# Patient Record
Sex: Male | Born: 1945 | Hispanic: Refuse to answer | Marital: Single | Smoking: Former smoker
Health system: Southern US, Community
[De-identification: ages and names within clinical notes are randomized; demographics above are authoritative.]

## PROBLEM LIST (undated history)

## (undated) DIAGNOSIS — J449 Chronic obstructive pulmonary disease, unspecified: Secondary | ICD-10-CM

## (undated) DIAGNOSIS — R011 Cardiac murmur, unspecified: Secondary | ICD-10-CM

## (undated) DIAGNOSIS — J45909 Unspecified asthma, uncomplicated: Secondary | ICD-10-CM

## (undated) DIAGNOSIS — K429 Umbilical hernia without obstruction or gangrene: Secondary | ICD-10-CM

## (undated) DIAGNOSIS — I219 Acute myocardial infarction, unspecified: Secondary | ICD-10-CM

## (undated) DIAGNOSIS — D860 Sarcoidosis of lung: Secondary | ICD-10-CM

## (undated) DIAGNOSIS — I4510 Unspecified right bundle-branch block: Secondary | ICD-10-CM

## (undated) DIAGNOSIS — F419 Anxiety disorder, unspecified: Secondary | ICD-10-CM

## (undated) DIAGNOSIS — I2510 Atherosclerotic heart disease of native coronary artery without angina pectoris: Secondary | ICD-10-CM

## (undated) DIAGNOSIS — F32A Depression, unspecified: Secondary | ICD-10-CM

## (undated) DIAGNOSIS — N400 Enlarged prostate without lower urinary tract symptoms: Secondary | ICD-10-CM

## (undated) DIAGNOSIS — T7840XA Allergy, unspecified, initial encounter: Secondary | ICD-10-CM

## (undated) DIAGNOSIS — R0600 Dyspnea, unspecified: Secondary | ICD-10-CM

## (undated) DIAGNOSIS — F4310 Post-traumatic stress disorder, unspecified: Secondary | ICD-10-CM

## (undated) DIAGNOSIS — H269 Unspecified cataract: Secondary | ICD-10-CM

## (undated) DIAGNOSIS — E785 Hyperlipidemia, unspecified: Secondary | ICD-10-CM

## (undated) DIAGNOSIS — K219 Gastro-esophageal reflux disease without esophagitis: Secondary | ICD-10-CM

## (undated) DIAGNOSIS — F329 Major depressive disorder, single episode, unspecified: Secondary | ICD-10-CM

## (undated) DIAGNOSIS — I10 Essential (primary) hypertension: Secondary | ICD-10-CM

## (undated) DIAGNOSIS — E119 Type 2 diabetes mellitus without complications: Secondary | ICD-10-CM

## (undated) DIAGNOSIS — Z8619 Personal history of other infectious and parasitic diseases: Secondary | ICD-10-CM

## (undated) HISTORY — DX: Post-traumatic stress disorder, unspecified: F43.10

## (undated) HISTORY — PX: CARDIAC CATHETERIZATION: SHX172

## (undated) HISTORY — DX: Type 2 diabetes mellitus without complications: E11.9

## (undated) HISTORY — DX: Personal history of other infectious and parasitic diseases: Z86.19

## (undated) HISTORY — PX: OTHER SURGICAL HISTORY: SHX169

## (undated) HISTORY — PX: EYE SURGERY: SHX253

## (undated) HISTORY — DX: Hyperlipidemia, unspecified: E78.5

## (undated) HISTORY — DX: Essential (primary) hypertension: I10

## (undated) HISTORY — DX: Unspecified asthma, uncomplicated: J45.909

## (undated) HISTORY — DX: Sarcoidosis of lung: D86.0

## (undated) HISTORY — DX: Unspecified cataract: H26.9

## (undated) HISTORY — PX: CORONARY STENT PLACEMENT: SHX1402

## (undated) HISTORY — DX: Allergy, unspecified, initial encounter: T78.40XA

## (undated) HISTORY — DX: Acute myocardial infarction, unspecified: I21.9

## (undated) HISTORY — DX: Atherosclerotic heart disease of native coronary artery without angina pectoris: I25.10

---

## 1997-02-10 HISTORY — PX: CORONARY ANGIOPLASTY WITH STENT PLACEMENT: SHX49

## 2006-03-11 ENCOUNTER — Encounter: Payer: Self-pay | Admitting: Internal Medicine

## 2006-04-06 ENCOUNTER — Encounter: Payer: Self-pay | Admitting: Internal Medicine

## 2006-05-15 ENCOUNTER — Encounter: Payer: Self-pay | Admitting: Internal Medicine

## 2009-04-30 ENCOUNTER — Encounter: Payer: Self-pay | Admitting: Internal Medicine

## 2009-05-01 ENCOUNTER — Encounter: Payer: Self-pay | Admitting: Internal Medicine

## 2009-09-03 LAB — HM COLONOSCOPY

## 2009-09-07 ENCOUNTER — Encounter: Payer: Self-pay | Admitting: Internal Medicine

## 2009-10-12 ENCOUNTER — Encounter: Payer: Self-pay | Admitting: Internal Medicine

## 2012-05-19 DIAGNOSIS — I2119 ST elevation (STEMI) myocardial infarction involving other coronary artery of inferior wall: Secondary | ICD-10-CM

## 2012-05-19 HISTORY — DX: ST elevation (STEMI) myocardial infarction involving other coronary artery of inferior wall: I21.19

## 2013-08-15 ENCOUNTER — Telehealth: Payer: Self-pay | Admitting: *Deleted

## 2013-08-15 ENCOUNTER — Other Ambulatory Visit: Payer: Self-pay | Admitting: Adult Health

## 2013-08-15 ENCOUNTER — Ambulatory Visit (INDEPENDENT_AMBULATORY_CARE_PROVIDER_SITE_OTHER)
Admission: RE | Admit: 2013-08-15 | Discharge: 2013-08-15 | Disposition: A | Discharge status: Home / Self Care | Payer: Medicare Other | Source: Ambulatory Visit | Attending: Adult Health | Admitting: Adult Health

## 2013-08-15 ENCOUNTER — Ambulatory Visit (INDEPENDENT_AMBULATORY_CARE_PROVIDER_SITE_OTHER): Payer: Medicare Other | Admitting: Adult Health

## 2013-08-15 ENCOUNTER — Encounter: Payer: Self-pay | Admitting: Adult Health

## 2013-08-15 VITALS — BP 132/88 | HR 72 | Temp 98.3°F | Resp 16 | Ht 69.75 in | Wt 199.0 lb

## 2013-08-15 DIAGNOSIS — E875 Hyperkalemia: Secondary | ICD-10-CM

## 2013-08-15 DIAGNOSIS — I2510 Atherosclerotic heart disease of native coronary artery without angina pectoris: Secondary | ICD-10-CM

## 2013-08-15 DIAGNOSIS — R5381 Other malaise: Secondary | ICD-10-CM

## 2013-08-15 DIAGNOSIS — I10 Essential (primary) hypertension: Secondary | ICD-10-CM

## 2013-08-15 DIAGNOSIS — E785 Hyperlipidemia, unspecified: Secondary | ICD-10-CM

## 2013-08-15 DIAGNOSIS — R05 Cough: Secondary | ICD-10-CM

## 2013-08-15 DIAGNOSIS — Z23 Encounter for immunization: Secondary | ICD-10-CM | POA: Insufficient documentation

## 2013-08-15 DIAGNOSIS — R5383 Other fatigue: Secondary | ICD-10-CM | POA: Insufficient documentation

## 2013-08-15 DIAGNOSIS — Z1211 Encounter for screening for malignant neoplasm of colon: Secondary | ICD-10-CM

## 2013-08-15 DIAGNOSIS — R059 Cough, unspecified: Secondary | ICD-10-CM

## 2013-08-15 DIAGNOSIS — D126 Benign neoplasm of colon, unspecified: Secondary | ICD-10-CM | POA: Insufficient documentation

## 2013-08-15 LAB — CBC WITH DIFFERENTIAL/PLATELET
BASOS PCT: 0.5 % (ref 0.0–3.0)
Basophils Absolute: 0 10*3/uL (ref 0.0–0.1)
EOS PCT: 6.8 % — AB (ref 0.0–5.0)
Eosinophils Absolute: 0.5 10*3/uL (ref 0.0–0.7)
HCT: 44.1 % (ref 39.0–52.0)
Hemoglobin: 14.9 g/dL (ref 13.0–17.0)
LYMPHS PCT: 15 % (ref 12.0–46.0)
Lymphs Abs: 1.2 10*3/uL (ref 0.7–4.0)
MCHC: 33.9 g/dL (ref 30.0–36.0)
MCV: 85.9 fl (ref 78.0–100.0)
MONO ABS: 1.1 10*3/uL — AB (ref 0.1–1.0)
Monocytes Relative: 14.3 % — ABNORMAL HIGH (ref 3.0–12.0)
NEUTROS PCT: 63.4 % (ref 43.0–77.0)
Neutro Abs: 5 10*3/uL (ref 1.4–7.7)
Platelets: 311 10*3/uL (ref 150.0–400.0)
RBC: 5.13 Mil/uL (ref 4.22–5.81)
RDW: 14 % (ref 11.5–15.5)
WBC: 7.9 10*3/uL (ref 4.0–10.5)

## 2013-08-15 LAB — COMPREHENSIVE METABOLIC PANEL
ALBUMIN: 3.9 g/dL (ref 3.5–5.2)
ALT: 26 U/L (ref 0–53)
AST: 24 U/L (ref 0–37)
Alkaline Phosphatase: 65 U/L (ref 39–117)
BUN: 17 mg/dL (ref 6–23)
CALCIUM: 9.9 mg/dL (ref 8.4–10.5)
CHLORIDE: 106 meq/L (ref 96–112)
CO2: 26 mEq/L (ref 19–32)
Creatinine, Ser: 0.9 mg/dL (ref 0.4–1.5)
GFR: 85.86 mL/min (ref >60.00)
GLUCOSE: 126 mg/dL — AB (ref 70–99)
POTASSIUM: 6.5 meq/L — AB (ref 3.5–5.1)
Sodium: 139 mEq/L (ref 135–145)
Total Bilirubin: 0.5 mg/dL (ref 0.2–1.2)
Total Protein: 7.1 g/dL (ref 6.0–8.3)

## 2013-08-15 LAB — LIPID PANEL
Cholesterol: 272 mg/dL — ABNORMAL HIGH (ref 0–200)
HDL: 40.9 mg/dL (ref >39.00)
LDL CALC: 193 mg/dL — AB (ref 0–99)
NonHDL: 231.1
Total CHOL/HDL Ratio: 7
Triglycerides: 192 mg/dL — ABNORMAL HIGH (ref 0.0–149.0)
VLDL: 38.4 mg/dL (ref 0.0–40.0)

## 2013-08-15 LAB — TSH: TSH: 1.7 u[IU]/mL (ref 0.35–4.50)

## 2013-08-15 MED ORDER — CARVEDILOL 6.25 MG PO TABS: 6.2500 mg | ORAL_TABLET | Freq: Two times a day (BID) | ORAL | Status: DC

## 2013-08-15 MED ORDER — ISOSORBIDE MONONITRATE ER 30 MG PO TB24: 30.0000 mg | ORAL_TABLET | Freq: Every day | ORAL | Status: DC

## 2013-08-15 MED ORDER — SODIUM POLYSTYRENE SULFONATE 15 GM/60ML PO SUSP: ORAL | Status: DC

## 2013-08-15 MED ORDER — CLOPIDOGREL BISULFATE 75 MG PO TABS: 75.0000 mg | ORAL_TABLET | Freq: Every day | ORAL | Status: DC

## 2013-08-15 MED ORDER — PRAVASTATIN SODIUM 20 MG PO TABS: 20.0000 mg | ORAL_TABLET | Freq: Every day | ORAL | Status: DC

## 2013-08-15 MED ORDER — LISINOPRIL 5 MG PO TABS: 5.0000 mg | ORAL_TABLET | Freq: Every day | ORAL | Status: DC

## 2013-08-15 MED ORDER — OMEPRAZOLE 20 MG PO CPDR: 20.0000 mg | DELAYED_RELEASE_CAPSULE | Freq: Every day | ORAL | Status: DC

## 2013-08-15 NOTE — Telephone Encounter (Signed)
Would like this repeated. Kidney function is normal. ?hemolysis

## 2013-08-15 NOTE — Progress Notes (Signed)
Pre visit review using our clinic review tool, if applicable. No additional management support is needed unless otherwise documented below in the visit note. 

## 2013-08-15 NOTE — Telephone Encounter (Signed)
I am sending in a prescription to pharmacy for Kayexalate. Pt needs to take per instructions on the bottle. This medication will make him have a BM and eliminate potassium through his stool. He needs to come back in tomorrow for repeat potassium. Avoid high potassium foods such as bananas, raisins, baked potato, avocado, mushrooms, squash, yogurt and leafy greens.

## 2013-08-15 NOTE — Telephone Encounter (Signed)
Lab stated there was no Hemolysis

## 2013-08-15 NOTE — Telephone Encounter (Signed)
Spoke with patient to notify him of Raquel's comments. Patient verbalized understanding. Lab appt scheduled 08/16/13 at 2pm.

## 2013-08-15 NOTE — Telephone Encounter (Signed)
Critical lab: Potassium 6.5 (High)

## 2013-08-15 NOTE — Progress Notes (Signed)
Subjective:    Patient ID: Ricky Abbott, male    DOB: 1945/10/11, 68 y.o.   MRN: [id number]  HPI  Pleasant 68 yo caucasian male that presents today to establish care. His previous PCP was Dr. Ina Homes in [facility] thorough [employer]. Also followed by Pulmonologist in [facility] for sarcoidosis. Has not been seen since 2011 secondary to not having insurance.    Had previous MI in April 2014 treated at [facility]. First stent was placed about 17 years ago, with the second being placed in April 2014. Was supposed to go for follow but secondary to limited resources was unable. Describes chest pain with activity that causes him to rest. Occurs mainly with strenuous activity and heavy lifting. Denies chest pain at rest. Has run out of his medications and has not taken for ~ 2 weeks.  Indicates he has had a cough and feels like he has a pneumonia. Symptoms started about 2.5 weeks ago. Coughing up mucus, denies fever. Wakes him up from sleep with sore throat and coughing. Reports hx of GERD and was treated. No treatment presently.  States that he has PTSD. He served in 3 tours in [place] and when he returned he would have flashbacks. Has never been treated for this. Avoids bar and is currently "controlling it" himself.   Prevention: Tetanus: ~ 10 years ago; due Pneumonia: Unknown Colonoscopy: ~ 7 years ago. ? Polyps. Was told he needed to follow up in 5 years.  Past Medical History  Diagnosis Date  . Diabetes mellitus without complication   . Hypertension   . Hyperlipidemia   . Allergy   . History of chicken pox   . CAD, multiple vessel   . Sarcoidosis of lung   . PTSD (post-traumatic stress disorder)     [place] Vet   Past Surgical History  Procedure Laterality Date  . Coronary stent placement  1998, 05/2012    x 2   Outpatient Encounter Prescriptions as of 08/15/2013  Medication Sig  . aspirin 325 MG tablet Take 325 mg by mouth daily.  . carvedilol (COREG) 6.25 MG tablet Take  6.25 mg by mouth 2 (two) times daily with a meal.  . clopidogrel (PLAVIX) 75 MG tablet Take 75 mg by mouth daily.  . isosorbide mononitrate (IMDUR) 30 MG 24 hr tablet Take 30 mg by mouth daily.  Marland Kitchen lisinopril (PRINIVIL,ZESTRIL) 5 MG tablet Take 5 mg by mouth daily.  . pravastatin (PRAVACHOL) 20 MG tablet Take 20 mg by mouth daily.  Marland Kitchen omeprazole (PRILOSEC) 20 MG capsule Take 1 capsule (20 mg total) by mouth daily.    Review of Systems  Constitutional: Negative.   HENT: Positive for sore throat.   Eyes: Negative.   Respiratory: Positive for cough.   Cardiovascular: Positive for chest pain (with strenous activity - denies currently).  Endocrine: Negative.   Genitourinary: Negative.   Musculoskeletal: Negative.   Skin: Negative.   Allergic/Immunologic: Negative.   Neurological: Negative.   Hematological: Negative.   Psychiatric/Behavioral: Negative.        Objective:  BP 132/88  Pulse 72  Temp(Src) 98.3 F (36.8 C) (Oral)  Resp 16  Ht 5' 9.75" (1.772 m)  Wt 199 lb (90.266 kg)  BMI 28.75 kg/m2  SpO2 97%   Physical Exam  Constitutional: He is oriented to person, place, and time. Vital signs are normal. He appears well-developed and well-nourished. No distress.  Eyes: Pupils are equal, round, and reactive to light.  Neck: Normal range of motion.  Cardiovascular:  Normal rate, regular rhythm and normal heart sounds.   Pulmonary/Chest: Effort normal and breath sounds normal.  Neurological: He is alert and oriented to person, place, and time.  Psychiatric: He has a normal mood and affect. His behavior is normal. Judgment and thought content normal.      Assessment & Plan:  1. CAD, multiple vessel Ran out of medications about 2 weeks ago - refilled medications. Refer to cardiologist to establish care and management of CAD. History of MI in April 2014.  - aspirin 325 MG tablet; Take 325 mg by mouth daily. - Ambulatory referral to Cardiology  2. HLD (hyperlipidemia) Continue  pravastain 20 mg daily. Obtain lipid panel.  - Lipid panel  3. Essential hypertension Blood pressure today within goal range. Continue current medications as above and obtain CMP.  - Comprehensive metabolic panel  4. Other fatigue Obtain CBC and TSH to rule out anemia and hypothyroidism. Will adjust plan pending results.   - CBC with Differential - TSH  5. Screen for colon cancer Last colonoscopy 7 years ago. Patient reports small polyps were found but told "nothing to be concerned about". Will schedule with GI for colonoscopy.   - Ambulatory referral to Gastroenterology  6. Cough Symptoms described consistent with GERD, given previous history of sarcoidosis, will obtain chest film to rule out sarcoidosis and PNA. Start omeprazole 20 mg daily.   - DG Chest 2 View; Future  7. Need for tetanus booster Last tetanus about 10 years ago. Will give in office.

## 2013-08-16 ENCOUNTER — Other Ambulatory Visit (INDEPENDENT_AMBULATORY_CARE_PROVIDER_SITE_OTHER): Payer: Medicare Other

## 2013-08-16 ENCOUNTER — Telehealth: Payer: Self-pay | Admitting: Adult Health

## 2013-08-16 DIAGNOSIS — E875 Hyperkalemia: Secondary | ICD-10-CM

## 2013-08-16 LAB — POTASSIUM: Potassium: 3.9 mEq/L (ref 3.5–5.1)

## 2013-08-16 NOTE — Telephone Encounter (Signed)
Relevant patient education assigned to patient using Emmi. ° °

## 2013-08-17 NOTE — Telephone Encounter (Signed)
error 

## 2013-08-26 ENCOUNTER — Telehealth: Payer: Self-pay

## 2013-08-26 NOTE — Telephone Encounter (Signed)
Patient dropped off a list of his pervious providers from [place]. [facility] Specialist  [phone]  2. Elyn Aquas. Demaris Callander Pulmonologist [phone]  0. Kirk Ruths [facility] disease Specialist [phone] Would you like me to request records from any of these providers?

## 2013-08-27 NOTE — Telephone Encounter (Signed)
Yes please

## 2013-08-30 NOTE — Telephone Encounter (Signed)
Request faxed to all three providers.

## 2013-09-01 ENCOUNTER — Encounter: Payer: Self-pay | Admitting: *Deleted

## 2013-09-02 ENCOUNTER — Ambulatory Visit (INDEPENDENT_AMBULATORY_CARE_PROVIDER_SITE_OTHER): Payer: Medicare Other | Admitting: Cardiovascular Disease

## 2013-09-02 ENCOUNTER — Encounter: Payer: Self-pay | Admitting: Cardiovascular Disease

## 2013-09-02 VITALS — BP 162/83 | HR 71 | Ht 69.0 in | Wt 201.2 lb

## 2013-09-02 DIAGNOSIS — R0989 Other specified symptoms and signs involving the circulatory and respiratory systems: Secondary | ICD-10-CM

## 2013-09-02 DIAGNOSIS — Z955 Presence of coronary angioplasty implant and graft: Secondary | ICD-10-CM

## 2013-09-02 DIAGNOSIS — R079 Chest pain, unspecified: Secondary | ICD-10-CM

## 2013-09-02 DIAGNOSIS — Z9861 Coronary angioplasty status: Secondary | ICD-10-CM

## 2013-09-02 DIAGNOSIS — E785 Hyperlipidemia, unspecified: Secondary | ICD-10-CM

## 2013-09-02 DIAGNOSIS — I2510 Atherosclerotic heart disease of native coronary artery without angina pectoris: Secondary | ICD-10-CM

## 2013-09-02 DIAGNOSIS — I10 Essential (primary) hypertension: Secondary | ICD-10-CM

## 2013-09-02 DIAGNOSIS — R6889 Other general symptoms and signs: Secondary | ICD-10-CM

## 2013-09-02 MED ORDER — NITROGLYCERIN 0.4 MG SL SUBL: 0.4000 mg | SUBLINGUAL_TABLET | SUBLINGUAL | Status: DC | PRN

## 2013-09-02 MED ORDER — CARVEDILOL 12.5 MG PO TABS: 12.5000 mg | ORAL_TABLET | Freq: Two times a day (BID) | ORAL | Status: DC

## 2013-09-02 MED ORDER — AMLODIPINE BESYLATE 10 MG PO TABS: 10.0000 mg | ORAL_TABLET | Freq: Every day | ORAL | Status: DC

## 2013-09-02 NOTE — Progress Notes (Signed)
Patient ID: Ricky Abbott, male    DOB: 29-Jul-1945, 68 y.o.   MRN: [id number]  HPI Comments: 68 yo caucasian male with notes detailing a hx of  Sarcoidosis, and coronary artery disease with stent placed 20 years ago in [place], [place] in April 2014 presenting to [facility] with stent placed at that time who presents to establish care. History of smoking, quit in 1984, history of alcohol, depression. He continues to drink 40 ounces beers on a frequent basis.  Has not been seen since 2011 secondary to not having insurance.     he reports having back pain and chest pain April 2014. He was taken to [facility], transferred by helicopter to [facility].  No chest pain since that time but does report running out of his medications recently and started having chest pain again. Once he was restarted on his medications by primary care, chest pain resolved. Around this time he had upper respiratory infection. chest pain occurs mainly with strenuous activity and heavy lifting. Denies chest pain at rest.  He does have pain in his legs when he exerts himself   Today he reports having a tight throat like it is trying to close up. Severe symptoms in our waiting room. Some improvement when seen in the exam room   history of  PTSD. 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 Has never been treated for this.  Avoids bar and is currently "controlling it" himself.   EKG shows normal sinus rhythm with rate 71 beats per minute, right bundle branch block   Outpatient Encounter Prescriptions as of 09/02/2013  Medication Sig  . clopidogrel (PLAVIX) 75 MG tablet Take 1 tablet (75 mg total) by mouth daily.  . isosorbide mononitrate (IMDUR) 30 MG 24 hr tablet Take 1 tablet (30 mg total) by mouth daily.  Marland Kitchen omeprazole (PRILOSEC) 20 MG capsule Take 1 capsule (20 mg total) by mouth daily.  . pravastatin (PRAVACHOL) 20 MG tablet Take 1 tablet (20 mg total) by mouth daily.  . sodium polystyrene (KAYEXALATE) 15 GM/60ML suspension  Take 60 ml daily x 2 days.  Marland Kitchen aspirin 325 MG tablet Take 325 mg by mouth daily.  . carvedilol (COREG) 6.25 MG tablet Take 1 tablet (6.25 mg total) by mouth 2 (two) times daily with a meal.  . lisinopril (PRINIVIL,ZESTRIL) 5 MG tablet Take 1 tablet (5 mg total) by mouth daily.    Review of Systems  Constitutional: Negative.   HENT: Negative.   Eyes: Negative.   Respiratory: Positive for chest tightness.   Cardiovascular: Negative.   Gastrointestinal: Negative.   Endocrine: Negative.   Musculoskeletal: Negative.        Leg pain with exertion  Skin: Negative.   Allergic/Immunologic: Negative.   Neurological: Negative.   Hematological: Negative.   Psychiatric/Behavioral: Negative.   All other systems reviewed and are negative.   BP 162/83  Pulse 71  Ht 5\' 9"  (1.753 m)  Wt 201 lb 4 oz (91.286 kg)  BMI 29.71 kg/m2 Recheck her blood pressure shows systolic pressure 741 Physical Exam  Nursing note and vitals reviewed. Constitutional: He is oriented to person, place, and time. He appears well-developed and well-nourished.  HENT:  Head: Normocephalic.  Nose: Nose normal.  Mouth/Throat: Oropharynx is clear and moist.  Eyes: Conjunctivae are normal. Pupils are equal, round, and reactive to light.  Neck: Normal range of motion. Neck supple. No JVD present.  Cardiovascular: Normal rate, regular rhythm, S1 normal, S2 normal, normal heart sounds and  intact distal pulses.  Exam reveals no gallop and no friction rub.   No murmur heard. Pulmonary/Chest: Effort normal and breath sounds normal. No respiratory distress. He has no wheezes. He has no rales. He exhibits no tenderness.  Abdominal: Soft. Bowel sounds are normal. He exhibits no distension. There is no tenderness.  Musculoskeletal: Normal range of motion. He exhibits no edema and no tenderness.  Lymphadenopathy:    He has no cervical adenopathy.  Neurological: He is alert and oriented to person, place, and time. Coordination normal.   Skin: Skin is warm and dry. No rash noted. No erythema.  Psychiatric: He has a normal mood and affect. His behavior is normal. Judgment and thought content normal.      Assessment and Plan

## 2013-09-02 NOTE — Patient Instructions (Addendum)
Please increase the coreg /cardvedilol up to 12.5 mg twice a day  Please start amlodipine one a day for cholesterol  Hold the lisinopril Decrease the aspirin  Monitor your blood pressure  Please call us if you have new issues that need to be addressed before your next appt.  Your physician wants you to follow-up in: 6 months.  You will receive a reminder letter in the mail two months in advance. If you don't receive a letter, please call our office to schedule the follow-up appointment.

## 2013-09-04 DIAGNOSIS — R6889 Other general symptoms and signs: Secondary | ICD-10-CM | POA: Insufficient documentation

## 2013-09-04 DIAGNOSIS — R0989 Other specified symptoms and signs involving the circulatory and respiratory systems: Secondary | ICD-10-CM | POA: Insufficient documentation

## 2013-09-04 DIAGNOSIS — R0789 Other chest pain: Secondary | ICD-10-CM | POA: Insufficient documentation

## 2013-09-04 DIAGNOSIS — Z955 Presence of coronary angioplasty implant and graft: Secondary | ICD-10-CM | POA: Insufficient documentation

## 2013-09-04 NOTE — Assessment & Plan Note (Signed)
Prior cardiac catheterization suggested

## 2013-09-04 NOTE — Assessment & Plan Note (Signed)
Etiology not clear though out of proportion, we'll hold the ACE inhibitor

## 2013-09-04 NOTE — Assessment & Plan Note (Signed)
Recent symptoms while off his medications. Could be secondary to severe hypertension. Suggested if symptoms recur that he call our office

## 2013-09-04 NOTE — Assessment & Plan Note (Signed)
Recommended he stay on his pravastatin. Goal LDL less than 70

## 2013-09-04 NOTE — Assessment & Plan Note (Addendum)
We will try to obtain the cardiac catheterization records for our system.  Back on his medications, reports having no chest pain symptoms. There is concern of ischemia given chest pain symptoms off his medications. He does not think that he needs a stress test at this time. Recommended he call our office if chest pain symptoms recur. Nitroglycerin sublingual was given

## 2013-09-04 NOTE — Assessment & Plan Note (Signed)
Blood pressure is elevated today Recommended he stopped the ACE inhibitor given his acute throat tightness, concerning for angioedema. We will start amlodipine 10 mg daily, increase the Coreg up to 12.5 mg twice a day

## 2013-09-06 ENCOUNTER — Telehealth: Payer: Self-pay | Admitting: *Deleted

## 2013-09-06 NOTE — Telephone Encounter (Signed)
Patient called and has a question about a wrist bp gauge.

## 2013-09-06 NOTE — Telephone Encounter (Signed)
Spoke w/ pt.  He states that he cannot afford a BP monitor at this time.  States that his sister is coming to town in a couple of weeks and she has a wrist BP monitor that she will let him borrow.  Advised pt that we have a spare electronic BP monitor that he can borrow and I will leave at the front desk for him to pick up at his convenience.  Pt is appreciative and will pick this up today.  He will call w/ further questions or concerns.

## 2013-10-21 ENCOUNTER — Telehealth: Payer: Self-pay | Admitting: Adult Health

## 2013-10-21 NOTE — Telephone Encounter (Signed)
Pt called in and stated [facility] is going to be faxing over a request for his prescriptions for mail order. FYI

## 2013-10-21 NOTE — Telephone Encounter (Signed)
Awaiting request

## 2013-10-25 ENCOUNTER — Other Ambulatory Visit: Payer: Self-pay | Admitting: *Deleted

## 2013-10-25 DIAGNOSIS — R059 Cough, unspecified: Secondary | ICD-10-CM

## 2013-10-25 DIAGNOSIS — R05 Cough: Secondary | ICD-10-CM

## 2013-10-25 MED ORDER — AMLODIPINE BESYLATE 10 MG PO TABS: 10.0000 mg | ORAL_TABLET | Freq: Every day | ORAL | Status: DC

## 2013-10-25 MED ORDER — OMEPRAZOLE 20 MG PO CPDR: 20.0000 mg | DELAYED_RELEASE_CAPSULE | Freq: Every day | ORAL | Status: DC

## 2013-10-25 MED ORDER — CLOPIDOGREL BISULFATE 75 MG PO TABS: 75.0000 mg | ORAL_TABLET | Freq: Every day | ORAL | Status: DC

## 2013-10-25 MED ORDER — PRAVASTATIN SODIUM 20 MG PO TABS: 20.0000 mg | ORAL_TABLET | Freq: Every day | ORAL | Status: DC

## 2013-10-25 MED ORDER — ISOSORBIDE MONONITRATE ER 30 MG PO TB24: 30.0000 mg | ORAL_TABLET | Freq: Every day | ORAL | Status: DC

## 2013-10-25 MED ORDER — CARVEDILOL 12.5 MG PO TABS: 12.5000 mg | ORAL_TABLET | Freq: Two times a day (BID) | ORAL | Status: DC

## 2013-12-15 ENCOUNTER — Ambulatory Visit (INDEPENDENT_AMBULATORY_CARE_PROVIDER_SITE_OTHER): Payer: Commercial Managed Care - HMO | Admitting: Internal Medicine

## 2013-12-15 ENCOUNTER — Encounter: Payer: Self-pay | Admitting: Internal Medicine

## 2013-12-15 VITALS — BP 140/78 | HR 70 | Temp 98.4°F | Resp 16 | Ht 69.0 in | Wt 208.8 lb

## 2013-12-15 DIAGNOSIS — Z125 Encounter for screening for malignant neoplasm of prostate: Secondary | ICD-10-CM

## 2013-12-15 DIAGNOSIS — Z8619 Personal history of other infectious and parasitic diseases: Secondary | ICD-10-CM | POA: Insufficient documentation

## 2013-12-15 DIAGNOSIS — D869 Sarcoidosis, unspecified: Secondary | ICD-10-CM

## 2013-12-15 DIAGNOSIS — I2510 Atherosclerotic heart disease of native coronary artery without angina pectoris: Secondary | ICD-10-CM

## 2013-12-15 DIAGNOSIS — E785 Hyperlipidemia, unspecified: Secondary | ICD-10-CM

## 2013-12-15 DIAGNOSIS — Z1211 Encounter for screening for malignant neoplasm of colon: Secondary | ICD-10-CM

## 2013-12-15 DIAGNOSIS — B1910 Unspecified viral hepatitis B without hepatic coma: Secondary | ICD-10-CM | POA: Insufficient documentation

## 2013-12-15 DIAGNOSIS — F4310 Post-traumatic stress disorder, unspecified: Secondary | ICD-10-CM

## 2013-12-15 DIAGNOSIS — I208 Other forms of angina pectoris: Secondary | ICD-10-CM

## 2013-12-15 DIAGNOSIS — B182 Chronic viral hepatitis C: Secondary | ICD-10-CM

## 2013-12-15 LAB — COMPREHENSIVE METABOLIC PANEL
ALBUMIN: 3.7 g/dL (ref 3.5–5.2)
ALK PHOS: 56 U/L (ref 39–117)
ALT: 30 U/L (ref 0–53)
AST: 18 U/L (ref 0–37)
BUN: 29 mg/dL — ABNORMAL HIGH (ref 6–23)
CO2: 23 mEq/L (ref 19–32)
Calcium: 10 mg/dL (ref 8.4–10.5)
Chloride: 105 mEq/L (ref 96–112)
Creatinine, Ser: 1 mg/dL (ref 0.4–1.5)
GFR: 79.81 mL/min (ref >60.00)
GLUCOSE: 129 mg/dL — AB (ref 70–99)
POTASSIUM: 5.1 meq/L (ref 3.5–5.1)
SODIUM: 141 meq/L (ref 135–145)
TOTAL PROTEIN: 7.4 g/dL (ref 6.0–8.3)
Total Bilirubin: 0.6 mg/dL (ref 0.2–1.2)

## 2013-12-15 LAB — LIPID PANEL
CHOL/HDL RATIO: 9
CHOLESTEROL: 236 mg/dL — AB (ref 0–200)
HDL: 26.9 mg/dL — AB (ref >39.00)
LDL Cholesterol: 170 mg/dL — ABNORMAL HIGH (ref 0–99)
NonHDL: 209.1
TRIGLYCERIDES: 198 mg/dL — AB (ref 0.0–149.0)
VLDL: 39.6 mg/dL (ref 0.0–40.0)

## 2013-12-15 LAB — PSA, MEDICARE: PSA: 0.86 ng/ml (ref 0.10–4.00)

## 2013-12-15 MED ORDER — SERTRALINE HCL 50 MG PO TABS: 50.0000 mg | ORAL_TABLET | Freq: Every day | ORAL | Status: DC

## 2013-12-15 NOTE — Patient Instructions (Signed)
Trial of generic zoloft for your PTSD  Start with half tablet for 4 days,  Then full tablet going forward,  Take after dinner  Return in mid December   Appt with Dr Rockey Situ to be scheduled

## 2013-12-15 NOTE — Progress Notes (Signed)
Pre-visit discussion using our clinic review tool. No additional management support is needed unless otherwise documented below in the visit note.  
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
## 2013-12-16 DIAGNOSIS — F4310 Post-traumatic stress disorder, unspecified: Secondary | ICD-10-CM | POA: Insufficient documentation

## 2013-12-16 LAB — HEPATITIS A ANTIBODY, IGM: Hep A IgM: NONREACTIVE

## 2013-12-16 LAB — HEPATITIS B SURFACE ANTIBODY,QUALITATIVE: Hep B S Ab: POSITIVE — AB

## 2013-12-16 LAB — HEPATITIS B SURFACE ANTIGEN: HEP B S AG: NEGATIVE

## 2013-12-16 MED ORDER — ATORVASTATIN CALCIUM 40 MG PO TABS: 40.0000 mg | ORAL_TABLET | Freq: Every day | ORAL | Status: DC

## 2013-12-16 NOTE — Assessment & Plan Note (Addendum)
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  His disorder has been untreated,  With last psychiatric evaluation very remote.  May have some hypomanic symptoms, which he describes as agitation and anger .  He has stopped using alcohol and has agreed to trial of sertraline.  Return in 2-3 weeks

## 2013-12-16 NOTE — Assessment & Plan Note (Signed)
Treated with prolonged steroids by prior pulmonologist.  Records requested,  Will need pulmonology follow up after cardiology issues defined for PFTS

## 2013-12-16 NOTE — Assessment & Plan Note (Signed)
Last panel did not result in a medication change.  Repeat panel done,  Change to high potency statin today.    Lab Results  Component Value Date   CHOL 236* 12/15/2013   HDL 26.90* 12/15/2013   LDLCALC 170* 12/15/2013   TRIG 198.0* 12/15/2013   CHOLHDL 9 12/15/2013

## 2013-12-16 NOTE — Assessment & Plan Note (Signed)
Given his 3 vessel disease and lapse in pavix post BMS April 2014,  His exertional dyspnea may be an anginal equivalent.  Referring back to cardiology for stress testing.

## 2013-12-18 ENCOUNTER — Encounter: Payer: Self-pay | Admitting: Internal Medicine

## 2013-12-19 LAB — HEPATITIS C RNA QUANTITATIVE: HCV QUANT: NOT DETECTED [IU]/mL (ref <15)

## 2013-12-20 ENCOUNTER — Telehealth: Payer: Self-pay | Admitting: Internal Medicine

## 2013-12-20 DIAGNOSIS — E785 Hyperlipidemia, unspecified: Secondary | ICD-10-CM

## 2013-12-20 MED ORDER — ATORVASTATIN CALCIUM 40 MG PO TABS: 40.0000 mg | ORAL_TABLET | Freq: Every day | ORAL | Status: DC

## 2013-12-20 NOTE — Telephone Encounter (Signed)
Patient returned call and ask to speak to manager patient upset because he assumed he was just being notified about labs from June, manager ask I speak with patient advised patient this was for labs drawn on 12/15/13 patient did not remember at first and then after talking for a few seconds he recalled and apologized. Patient was again advised of results patient ask for cholesterol medication to be sent to [facility] instead of [facility] assured patient I will take care of and advised of follow up appointment with Dr. Derrel Nip in one month. Script cancelled with [facility] and sent to [facility] as requested. FYI

## 2013-12-26 ENCOUNTER — Encounter: Payer: Self-pay | Admitting: Cardiovascular Disease

## 2013-12-26 ENCOUNTER — Ambulatory Visit (INDEPENDENT_AMBULATORY_CARE_PROVIDER_SITE_OTHER): Payer: Commercial Managed Care - HMO | Admitting: Cardiovascular Disease

## 2013-12-26 VITALS — BP 122/78 | HR 66 | Ht 69.0 in | Wt 204.5 lb

## 2013-12-26 DIAGNOSIS — F4310 Post-traumatic stress disorder, unspecified: Secondary | ICD-10-CM

## 2013-12-26 DIAGNOSIS — I10 Essential (primary) hypertension: Secondary | ICD-10-CM

## 2013-12-26 DIAGNOSIS — I2510 Atherosclerotic heart disease of native coronary artery without angina pectoris: Secondary | ICD-10-CM

## 2013-12-26 DIAGNOSIS — E785 Hyperlipidemia, unspecified: Secondary | ICD-10-CM

## 2013-12-26 DIAGNOSIS — R079 Chest pain, unspecified: Secondary | ICD-10-CM

## 2013-12-26 DIAGNOSIS — R0602 Shortness of breath: Secondary | ICD-10-CM

## 2013-12-26 DIAGNOSIS — R9431 Abnormal electrocardiogram [ECG] [EKG]: Secondary | ICD-10-CM

## 2013-12-26 NOTE — Progress Notes (Signed)
Patient ID: Ricky Abbott, male    DOB: 1945-03-19, 68 y.o.   MRN: [id number]  HPI Comments: 68 yo caucasian male with notes detailing a hx of  Sarcoidosis, and coronary artery disease with stent placed 20 years ago in [place], [place] in April 2014 presenting to [facility] with stent placed at that time  History of smoking, quit in 1984, history of alcohol, depression. Long history of drinking 40 ounces beers, quit recently per the patient  Has not been seen since 2011 secondary to not having insurance.   Presents for routine follow-up for shortness of breath  In follow-up today, he reports that he has been having shortness of breath with exertion. He reports it is somewhat stable but has been noticing this more. He started having symptoms after his previous stent in April 2014. He is sedentary, does not do any regular exercise. Also reports having leg burning when he walks which limits his ability to ambulate. Tolerating low-dose pravastatin with improvement of his cholesterol from 272 down to 236 Recently changed to Lipitor but has not started yet He reports having successful treatment of his hepatitis Reports he was recently started on medication for PTSD. Recent problem with his drinking and PTSD, outburst in his apartment complex, neighbors got mad at him  EKG shows normal sinus rhythm with rate 66 bpm, right bundle branch block, nonspecific ST abnormality  Other past medical history  he reports having back pain and chest pain April 2014. He was taken to [facility], transferred by [facility].  No chest pain since that time but does report running out of his medications recently and started having chest pain again. Once he was restarted on his medications by primary care, chest pain resolved. Around this time he had upper respiratory infection. chest pain occurs mainly with strenuous activity and heavy lifting. Denies chest pain at rest.  He does have pain in his legs  when he exerts himself    history of  PTSD. 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 Has never been treated for this.     Outpatient Encounter Prescriptions as of 12/26/2013  Medication Sig  . amLODipine (NORVASC) 10 MG tablet Take 1 tablet (10 mg total) by mouth daily.  Marland Kitchen aspirin 81 MG tablet Take 1 tablet (81 mg total) by mouth daily.  . carvedilol (COREG) 12.5 MG tablet Take 1 tablet (12.5 mg total) by mouth 2 (two) times daily with a meal.  . clopidogrel (PLAVIX) 75 MG tablet Take 1 tablet (75 mg total) by mouth daily.  . isosorbide mononitrate (IMDUR) 30 MG 24 hr tablet Take 1 tablet (30 mg total) by mouth daily.  . nitroGLYCERIN (NITROSTAT) 0.4 MG SL tablet Place 1 tablet (0.4 mg total) under the tongue every 5 (five) minutes as needed for chest pain.  Marland Kitchen omeprazole (PRILOSEC) 20 MG capsule Take 1 capsule (20 mg total) by mouth daily.  . sertraline (ZOLOFT) 50 MG tablet Take 1 tablet (50 mg total) by mouth daily.  . sodium polystyrene (KAYEXALATE) 15 GM/60ML suspension Take 60 ml daily x 2 days.  Marland Kitchen atorvastatin (LIPITOR) 40 MG tablet Take 1 tablet (40 mg total) by mouth daily.    Social history  reports that he has quit smoking. He does not have any smokeless tobacco history on file. He reports that he drinks about 8.4 oz of alcohol per week. He reports that he does not use illicit drugs.   Review of Systems  Constitutional: Negative.  Respiratory: Positive for chest tightness and shortness of breath.   Cardiovascular: Negative.   Musculoskeletal: Negative.        Leg pain with exertion  Neurological: Negative.   Psychiatric/Behavioral: Positive for dysphoric mood.  All other systems reviewed and are negative.   BP 122/78 mmHg  Pulse 66  Ht 5\' 9"  (1.753 m)  Wt 204 lb 8 oz (92.761 kg)  BMI 30.19 kg/m2  SpO2 97%  Physical Exam  Constitutional: He is oriented to person, place, and time. He appears well-developed and well-nourished.  HENT:  Head: Normocephalic.  Nose: Nose  normal.  Mouth/Throat: Oropharynx is clear and moist.  Eyes: Conjunctivae are normal. Pupils are equal, round, and reactive to light.  Neck: Normal range of motion. Neck supple. No JVD present.  Cardiovascular: Normal rate, regular rhythm, S1 normal, S2 normal, normal heart sounds and intact distal pulses.  Exam reveals no gallop and no friction rub.   No murmur heard. Pulmonary/Chest: Effort normal and breath sounds normal. No respiratory distress. He has no wheezes. He has no rales. He exhibits no tenderness.  Abdominal: Soft. Bowel sounds are normal. He exhibits no distension. There is no tenderness.  Musculoskeletal: Normal range of motion. He exhibits no edema or tenderness.  Lymphadenopathy:    He has no cervical adenopathy.  Neurological: He is alert and oriented to person, place, and time. Coordination normal.  Skin: Skin is warm and dry. No rash noted. No erythema.  Psychiatric: He has a normal mood and affect. His behavior is normal. Judgment and thought content normal.      Assessment and Plan   Nursing note and vitals reviewed.

## 2013-12-26 NOTE — Patient Instructions (Addendum)
You are doing well. No medication changes were made.  We will schedule a stress test, treadmill for shortness of breath  Please call us if you have new issues that need to be addressed before your next appt.  Your physician wants you to follow-up in: 6 months.  You will receive a reminder letter in the mail two months in advance. If you don't receive a letter, please call our office to schedule the follow-up appointment.  [place]  Your caregiver has ordered a Stress Test with nuclear imaging. The purpose of this test is to evaluate the blood supply to your heart muscle. This procedure is referred to as a "Non-Invasive Stress Test." This is because other than having an IV started in your vein, nothing is inserted or "invades" your body. Cardiac stress tests are done to find areas of poor blood flow to the heart by determining the extent of coronary artery disease (CAD). Some patients exercise on a treadmill, which naturally increases the blood flow to your heart, while others who are  unable to walk on a treadmill due to physical limitations have a pharmacologic/chemical stress agent called Lexiscan . This medicine will mimic walking on a treadmill by temporarily increasing your coronary blood flow.   Please note: these test may take anywhere between 2-4 hours to complete  PLEASE REPORT TO [place] AT THE FIRST DESK WILL DIRECT YOU WHERE TO GO  Date of Procedure:___Monday, November 23____  Arrival Time for Procedure:____7:45 am__________  Instructions regarding medication:   __X__:  Do not take CARVEDILOL the night before or morning of the procedure  __X__:  Do not take any of your medications the morning of the procedure  PLEASE NOTIFY THE OFFICE AT LEAST 24 HOURS IN ADVANCE IF YOU ARE UNABLE TO [place].  [phone] AND  PLEASE NOTIFY NUCLEAR MEDICINE AT [facility] AT LEAST 24 HOURS IN ADVANCE IF YOU ARE UNABLE TO KEEP YOUR APPOINTMENT.  [phone]  How to prepare for your Myoview test:  1. Do not eat or drink after midnight 2. No caffeine for 24 hours prior to test 3. No smoking 24 hours prior to test. 4. Your medication may be taken with water.  If your doctor stopped a medication because of this test, do not take that medication. 5. Ladies, please do not wear dresses.  Skirts or pants are appropriate. Please wear a short sleeve shirt. 6. No perfume, cologne or lotion. 7. Wear comfortable walking shoes. No heels!

## 2013-12-26 NOTE — Assessment & Plan Note (Signed)
Unable to exclude ischemia as a cause of his shortness of breath, chest tightness with exertion. We have ordered a treadmill Myoview to rule out ischemia

## 2013-12-26 NOTE — Assessment & Plan Note (Signed)
He reports PTSD, prior heavy drinking history. Currently is no longer drinking

## 2013-12-26 NOTE — Assessment & Plan Note (Signed)
Blood pressure is well controlled on today's visit. No changes made to the medications. 

## 2013-12-26 NOTE — Assessment & Plan Note (Signed)
Shortness of breath with exertion, some chest tightness. Stress test has been ordered as above.

## 2013-12-26 NOTE — Assessment & Plan Note (Signed)
Recently started on Lipitor, pravastatin held. Total cholesterol goal less than 150 Numbers were discussed with him

## 2013-12-29 ENCOUNTER — Other Ambulatory Visit: Payer: Self-pay | Admitting: *Deleted

## 2013-12-29 MED ORDER — CARVEDILOL 12.5 MG PO TABS: 12.5000 mg | ORAL_TABLET | Freq: Two times a day (BID) | ORAL | Status: DC

## 2014-01-02 ENCOUNTER — Ambulatory Visit: Payer: Self-pay | Admitting: Cardiovascular Disease

## 2014-01-02 DIAGNOSIS — R0602 Shortness of breath: Secondary | ICD-10-CM

## 2014-01-09 ENCOUNTER — Encounter: Payer: Self-pay | Admitting: Cardiovascular Disease

## 2014-01-11 ENCOUNTER — Other Ambulatory Visit: Payer: Self-pay

## 2014-01-11 DIAGNOSIS — R0602 Shortness of breath: Secondary | ICD-10-CM

## 2014-01-11 DIAGNOSIS — R9431 Abnormal electrocardiogram [ECG] [EKG]: Secondary | ICD-10-CM

## 2014-01-11 DIAGNOSIS — I2510 Atherosclerotic heart disease of native coronary artery without angina pectoris: Secondary | ICD-10-CM

## 2014-01-26 ENCOUNTER — Ambulatory Visit (INDEPENDENT_AMBULATORY_CARE_PROVIDER_SITE_OTHER): Payer: Commercial Managed Care - HMO | Admitting: Internal Medicine

## 2014-01-26 ENCOUNTER — Encounter: Payer: Self-pay | Admitting: Internal Medicine

## 2014-01-26 VITALS — BP 110/70 | HR 67 | Temp 98.5°F | Ht 69.0 in | Wt 204.0 lb

## 2014-01-26 DIAGNOSIS — B182 Chronic viral hepatitis C: Secondary | ICD-10-CM

## 2014-01-26 DIAGNOSIS — E785 Hyperlipidemia, unspecified: Secondary | ICD-10-CM

## 2014-01-26 DIAGNOSIS — D869 Sarcoidosis, unspecified: Secondary | ICD-10-CM

## 2014-01-26 DIAGNOSIS — Z79899 Other long term (current) drug therapy: Secondary | ICD-10-CM

## 2014-01-26 DIAGNOSIS — F4310 Post-traumatic stress disorder, unspecified: Secondary | ICD-10-CM

## 2014-01-26 DIAGNOSIS — Z23 Encounter for immunization: Secondary | ICD-10-CM

## 2014-01-26 LAB — COMPREHENSIVE METABOLIC PANEL
ALBUMIN: 3.8 g/dL (ref 3.5–5.2)
ALT: 21 U/L (ref 0–53)
AST: 18 U/L (ref 0–37)
Alkaline Phosphatase: 64 U/L (ref 39–117)
BUN: 22 mg/dL (ref 6–23)
CHLORIDE: 105 meq/L (ref 96–112)
CO2: 24 meq/L (ref 19–32)
CREATININE: 1 mg/dL (ref 0.4–1.5)
Calcium: 9.7 mg/dL (ref 8.4–10.5)
GFR: 82.66 mL/min (ref >60.00)
GLUCOSE: 118 mg/dL — AB (ref 70–99)
POTASSIUM: 5 meq/L (ref 3.5–5.1)
Sodium: 139 mEq/L (ref 135–145)
Total Bilirubin: 0.4 mg/dL (ref 0.2–1.2)
Total Protein: 7.1 g/dL (ref 6.0–8.3)

## 2014-01-26 LAB — LIPID PANEL
CHOLESTEROL: 193 mg/dL (ref 0–200)
HDL: 26.6 mg/dL — AB (ref >39.00)
LDL Cholesterol: 138 mg/dL — ABNORMAL HIGH (ref 0–99)
NonHDL: 166.4
TRIGLYCERIDES: 141 mg/dL (ref 0.0–149.0)
Total CHOL/HDL Ratio: 7
VLDL: 28.2 mg/dL (ref 0.0–40.0)

## 2014-01-26 MED ORDER — ALPRAZOLAM 0.5 MG PO TABS: 0.5000 mg | ORAL_TABLET | Freq: Every evening | ORAL | Status: DC | PRN

## 2014-01-26 NOTE — Progress Notes (Signed)
Patient ID: Ricky Abbott, male   DOB: September 06, 1945, 68 y.o.   MRN: [id number]  Patient Active Problem List   Diagnosis Date Noted  . PTSD (post-traumatic stress disorder) 12/16/2013  . Sarcoidosis 12/15/2013  . Hepatitis B 12/15/2013  . Hepatitis C 12/15/2013  . S/P coronary artery stent placement 09/04/2013  . Chest pain on exertion 09/04/2013  . Throat tightness 09/04/2013  . CAD, multiple vessel 08/15/2013  . HLD (hyperlipidemia) 08/15/2013  . Essential hypertension 08/15/2013  . Other fatigue 08/15/2013  . Screen for colon cancer 08/15/2013  . Need for tetanus booster 08/15/2013    Subjective:  CC:   Chief Complaint  Patient presents with  . Follow-up    HPI:   Ricky Abbott is a 68 y.o. male who presents for  Follow up on multiple issues including PTSD with frequent anger outburts, fatigue  Fatigue persists,  Stress test was negative per patient.  Still gets short of breath with exertion.  History of sarcoid .   Says he had a sleept study 40 years ago during hospitalizatio ofr PTSD  Also had shock treatment   Stopped the sertraline 4 days ago due to persistent episodes of "wooziness"m  Stopped drinking ETOH  And ha snot had any more altercations  Does not want to resume a SSEI   Last colonoscopy 2011  History of  pulmonary  Sarcoid . n [place] Records not received  Had a right heart catheterization over 15 years ago during workupby an MD [facility] [facility] Dr Demaris Callander    Past Medical History  Diagnosis Date  . Diabetes mellitus without complication   . Hypertension   . Hyperlipidemia   . Allergy   . History of chicken pox   . CAD, multiple vessel   . Sarcoidosis of lung   . PTSD (post-traumatic stress disorder)     [place] Vet  . MI (myocardial infarction)   . Asthma     Past Surgical History  Procedure Laterality Date  . Coronary stent placement  1998, 05/2012    x 2  . Cardiac catheterization      [place], [place]  . Cardiac catheterization       [facility]        The following portions of the patient's history were reviewed and updated as appropriate: Allergies, current medications, and problem list.    Review of Systems:   Patient denies headache, fevers, malaise, unintentional weight loss, skin rash, eye pain, sinus congestion and sinus pain, sore throat, dysphagia,  hemoptysis , cough, dyspnea, wheezing, chest pain, palpitations, orthopnea, edema, abdominal pain, nausea, melena, diarrhea, constipation, flank pain, dysuria, hematuria, urinary  Frequency, nocturia, numbness, tingling, seizures,  Focal weakness, Loss of consciousness,  Tremor, insomnia, depression, anxiety, and suicidal ideation.     History   Social History  . Marital Status: Single    Spouse Name: N/A    Number of Children: N/A  . Years of Education: N/A   Occupational History  . Not on file.   Social History Main Topics  . Smoking status: Former Smoker -- 2.00 packs/day for 26 years  . Smokeless tobacco: Not on file  . Alcohol Use: 8.4 oz/week    14 Cans of beer per week  . Drug Use: No  . Sexual Activity: Not on file   Other Topics Concern  . Not on file   Social History Narrative   Yvone Neu grew up in [place], [place]. He moved to the [place] area in 2013. [place] Veteran.  Retired Administrator.    Objective:  Filed Vitals:   01/26/14 0814  BP: 110/70  Pulse: 67  Temp: 98.5 F (36.9 C)     General appearance: alert, cooperative and appears stated age Ears: normal TM's and external ear canals both ears Throat: lips, mucosa, and tongue normal; teeth and gums normal Neck: no adenopathy, no carotid bruit, supple, symmetrical, trachea midline and thyroid not enlarged, symmetric, no tenderness/mass/nodules Back: symmetric, no curvature. ROM normal. No CVA tenderness. Lungs: clear to auscultation bilaterally Heart: regular rate and rhythm, S1, S2 normal, no murmur, click, rub or gallop Abdomen: soft, non-tender; bowel sounds normal; no  masses,  no organomegaly Pulses: 2+ and symmetric Skin: Skin color, texture, turgor normal. No rashes or lesions Lymph nodes: Cervical, supraclavicular, and axillary nodes normal.  Assessment and Plan:  Hepatitis C He has no viral load . He was previously treated with medication many years ago.  Hept  S AB was positive indicating immunization   Sarcoidosis Diagnosed many years ago.  Records requested . PFTS ordered.  PTSD (post-traumatic stress disorder) He did not tolerate zoloft trial, land does not want  to try another one at this time.  prn alprazolam for extreme anger,   A total of 30 minutes of face to face time was spent with patient more than half of which was spent in counselling and coordination of care     Updated Medication List Outpatient Encounter Prescriptions as of 01/26/2014  Medication Sig  . amLODipine (NORVASC) 10 MG tablet Take 1 tablet (10 mg total) by mouth daily.  Marland Kitchen aspirin 81 MG tablet Take 1 tablet (81 mg total) by mouth daily.  Marland Kitchen atorvastatin (LIPITOR) 40 MG tablet Take 1 tablet (40 mg total) by mouth daily.  . carvedilol (COREG) 12.5 MG tablet Take 1 tablet (12.5 mg total) by mouth 2 (two) times daily with a meal.  . clopidogrel (PLAVIX) 75 MG tablet Take 1 tablet (75 mg total) by mouth daily.  . isosorbide mononitrate (IMDUR) 30 MG 24 hr tablet Take 1 tablet (30 mg total) by mouth daily.  . nitroGLYCERIN (NITROSTAT) 0.4 MG SL tablet Place 1 tablet (0.4 mg total) under the tongue every 5 (five) minutes as needed for chest pain.  Marland Kitchen omeprazole (PRILOSEC) 20 MG capsule Take 1 capsule (20 mg total) by mouth daily.  . sodium polystyrene (KAYEXALATE) 15 GM/60ML suspension Take 60 ml daily x 2 days.  . [DISCONTINUED] sertraline (ZOLOFT) 50 MG tablet Take 1 tablet (50 mg total) by mouth daily.  Marland Kitchen ALPRAZolam (XANAX) 0.5 MG tablet Take 1 tablet (0.5 mg total) by mouth at bedtime as needed for anxiety.     Orders Placed This Encounter  Procedures  . Pneumococcal  polysaccharide vaccine 23-valent greater than or equal to 2yo subcutaneous/IM  . Lipid panel  . Comprehensive metabolic panel    No Follow-up on file.

## 2014-01-26 NOTE — Patient Instructions (Signed)
You are doing well  I am prescirbing a medication to take AS NEEDED (Not Daily) when you get very anxious or angry.  It will have a calming effect on you  Referral for pulmonary function tests and to pulmonologist in process( to see if your sarcoid is acting up)   Ricky Abbott we see you again in 3 months   You receive the pneumonia vaccine today

## 2014-01-26 NOTE — Progress Notes (Signed)
Pre visit review using our clinic review tool, if applicable. No additional management support is needed unless otherwise documented below in the visit note. 

## 2014-01-28 NOTE — Assessment & Plan Note (Signed)
He has no viral load . He was previously treated with medication many years ago.  Hept  S AB was positive indicating immunization

## 2014-01-28 NOTE — Assessment & Plan Note (Signed)
Diagnosed many years ago.  Records requested . PFTS ordered.

## 2014-01-28 NOTE — Assessment & Plan Note (Signed)
He did not tolerate zoloft trial, land does not want  to try another one at this time.  prn alprazolam for extreme anger,

## 2014-01-29 ENCOUNTER — Encounter: Payer: Self-pay | Admitting: Internal Medicine

## 2014-02-01 ENCOUNTER — Institutional Professional Consult (permissible substitution): Payer: Commercial Managed Care - HMO | Admitting: Internal Medicine

## 2014-03-01 ENCOUNTER — Telehealth: Payer: Self-pay | Admitting: Internal Medicine

## 2014-03-01 NOTE — Telephone Encounter (Signed)
Called and spoke to pt. Pt stated he is returning a call to [place]. Pt very aggravated and upset. Pt began to express his extreme frustration of how horrible the communication is in our office. I apologized for the frustration. When I informed the pt the probable reason for the call was to confirm the appt with VM on 03/02/2014. Pt stated he had a PFT before the appt and he forgot what a nurse told him about his meds, when I asked what time the appt was for PFT pt stated, "well you have my chart right in front of you, why dont you tell me?" Informed the pt that I am unable to see the pt's pft appt d/t it being in a different location. Pt continued to express his frustration with the communication. I advised the pt to not take any inhalers before his PFT appt (at 0900) to allow for a more accurate test. Pt stated with aggression, "well I dont' take any inhalers, dont you know that?". I asked the pt if he had any other issues needing addressed, otherwise for him to keep his appt tomorrow. Pt denied any further valid questions. Nothing further questions or concerns at this time.

## 2014-03-02 ENCOUNTER — Institutional Professional Consult (permissible substitution): Payer: Commercial Managed Care - HMO | Admitting: Internal Medicine

## 2014-03-02 ENCOUNTER — Encounter: Payer: Self-pay | Admitting: Internal Medicine

## 2014-03-02 ENCOUNTER — Ambulatory Visit: Payer: Self-pay | Admitting: Internal Medicine

## 2014-03-02 ENCOUNTER — Ambulatory Visit (INDEPENDENT_AMBULATORY_CARE_PROVIDER_SITE_OTHER): Payer: Commercial Managed Care - HMO | Admitting: Internal Medicine

## 2014-03-02 VITALS — BP 106/60 | HR 77 | Temp 97.6°F | Ht 69.0 in | Wt 199.8 lb

## 2014-03-02 DIAGNOSIS — D869 Sarcoidosis, unspecified: Secondary | ICD-10-CM | POA: Diagnosis not present

## 2014-03-02 DIAGNOSIS — R0609 Other forms of dyspnea: Secondary | ICD-10-CM | POA: Insufficient documentation

## 2014-03-02 DIAGNOSIS — R0602 Shortness of breath: Secondary | ICD-10-CM | POA: Diagnosis not present

## 2014-03-02 LAB — PULMONARY FUNCTION TEST

## 2014-03-02 MED ORDER — TIOTROPIUM BROMIDE MONOHYDRATE 2.5 MCG/ACT IN AERS: 2.5000 ug | INHALATION_SPRAY | Freq: Every day | RESPIRATORY_TRACT | Status: DC

## 2014-03-02 NOTE — Patient Instructions (Signed)
We will see you back in 4-6 weeks. Today we started you on Spiriva Respimat. We will give you a sample and also sent prescription to your pharmacy. Please get an annual eye exam. Please have you labwork done week before your follow up appointment at [facility] on [address].

## 2014-03-07 ENCOUNTER — Encounter: Payer: Self-pay | Admitting: Internal Medicine

## 2014-03-07 NOTE — Progress Notes (Signed)
Date: 03/07/2014  MRN# [id number] Ricky Abbott [birth year]  Referring Physician: Dr. Rick Duff Ricky Abbott is a 69 y.o. old male seen in consultation for history of pulmonary sacoidosis  CC:  Chief Complaint  Patient presents with  . Advice Only    new referral-Dr. Derrel Nip Sarcoidosis: pt having sob with sl. wheezing and cough. Pt has not noticed chest tightness. Last xray in June 2015. Pt states that when walking his legs starting burning from knee down and he begins to cough.    HPI:  Patient is a pleasant 69 year old male is presenting today follow-up of sarcoidosis (pulmonary). Patient doesn't recall much history of his pulmonary sarcoidosis, he states he was diagnosed with sarcoidosis about 15-20 years ago in [place]. He states that he was following with a pulmonologist there and he was diagnosed by blood tests and pulmonary function testing. Patient states that he was on prednisone for a short period of time after which she was told his sarcoidosis was gone. From what he could recall he said he had large lymph nodes in his lungs. Since then patient states that he's been doing fairly well.  The patient states that he was a former smoker, quit 30 years ago, smoked 4 packs per day for about 25 years. Past medical history significant for myocardial infarction in 2014 Patient states he does have some dyspnea on exertion, can walk about 20 yards, can climb about 1 flight of stairs, it was gradual in onset before he had his MI, it has not changed in the past 3-4 years. Patient also endorses a cough: Chronic morning cough with clear sputum however was placed on a PPI and the cough is improved since then. He followed with Dr. Tobin [facility], [phone]). Patient states he was hospitalized once for bronchitis/pneumonia about 30 years ago, did not require any intubation. Patient cannot recall any urgent care visits/ED visits/primary care physician visits for any bronchitis,  shortness of breath, upper respiratory tract infections that required antibiotics or steroids in the past few years.    PMHX:   Past Medical History  Diagnosis Date  . Diabetes mellitus without complication   . Hypertension   . Hyperlipidemia   . Allergy   . History of chicken pox   . CAD, multiple vessel   . Sarcoidosis of lung   . PTSD (post-traumatic stress disorder)     [place] [facility]  . MI (myocardial infarction)   . Asthma    Surgical Hx:  Past Surgical History  Procedure Laterality Date  . Coronary stent placement  1998, 05/2012    x 2  . Cardiac catheterization      [facility] [place]  . Cardiac catheterization      [facility]    Family Hx:  Family History  Problem Relation Age of Onset  . Cancer Mother   . Hyperlipidemia Mother   . Hypertension Mother   . Cancer Father     stomach  . Hyperlipidemia Father   . Hypertension Father   . Cancer Brother     bone cancer   Social Hx:   History  Substance Use Topics  . Smoking status: Former Smoker -- 4.00 packs/day for 25 years    Types: Cigarettes  . Smokeless tobacco: Never Used     Comment: quit 30 years asgo  . Alcohol Use: 8.4 oz/week    14 Cans of beer per week   Medication:   Current Outpatient Rx  Name  Route  Sig  Dispense  Refill  . ALPRAZolam (XANAX) 0.5 MG tablet   Oral   Take 1 tablet (0.5 mg total) by mouth at bedtime as needed for anxiety.   30 tablet   0   . amLODipine (NORVASC) 10 MG tablet   Oral   Take 1 tablet (10 mg total) by mouth daily.   90 tablet   2   . aspirin 81 MG tablet   Oral   Take 1 tablet (81 mg total) by mouth daily.         Marland Kitchen atorvastatin (LIPITOR) 40 MG tablet   Oral   Take 1 tablet (40 mg total) by mouth daily.   90 tablet   3   . carvedilol (COREG) 12.5 MG tablet   Oral   Take 1 tablet (12.5 mg total) by mouth 2 (two) times daily with a meal.   180 tablet   1   . clopidogrel (PLAVIX) 75 MG tablet   Oral   Take 1 tablet (75 mg total) by mouth  daily.   90 tablet   1   . isosorbide mononitrate (IMDUR) 30 MG 24 hr tablet   Oral   Take 1 tablet (30 mg total) by mouth daily.   90 tablet   2   . nitroGLYCERIN (NITROSTAT) 0.4 MG SL tablet   Sublingual   Place 1 tablet (0.4 mg total) under the tongue every 5 (five) minutes as needed for chest pain.   25 tablet   3   . omeprazole (PRILOSEC) 20 MG capsule   Oral   Take 1 capsule (20 mg total) by mouth daily.   90 capsule   1   . Tiotropium Bromide Monohydrate (SPIRIVA RESPIMAT) 2.5 MCG/ACT AERS   Inhalation   Inhale 2.5 mcg into the lungs daily.   3 Inhaler   3   . Tiotropium Bromide Monohydrate (SPIRIVA RESPIMAT) 2.5 MCG/ACT AERS   Inhalation   Inhale 2.5 mcg into the lungs daily.   1 Inhaler   0       Allergies:  Review of patient's allergies indicates no known allergies.  Review of Systems: Gen:  Denies  fever, sweats, chills HEENT: Denies blurred vision, double vision, ear pain, eye pain, hearing loss, nose bleeds, sore throat Cvc:  No dizziness, chest pain or heaviness Resp:   Denies cough or sputum porduction, shortness of breath Gi: Denies swallowing difficulty, stomach pain, nausea or vomiting, diarrhea, constipation, bowel incontinence Gu:  Denies bladder incontinence, burning urine Ext:   No Joint pain, stiffness or swelling Skin: No skin rash, easy bruising or bleeding or hives Endoc:  No polyuria, polydipsia , polyphagia or weight change Psych: No depression, insomnia or hallucinations  Other:  All other systems negative  Physical Examination:   VS: BP 106/60 mmHg  Pulse 77  Temp(Src) 97.6 F (36.4 C) (Oral)  Ht 5' 9"  (1.753 m)  Wt 199 lb 12.8 oz (90.629 kg)  BMI 29.49 kg/m2  SpO2 94%  General Appearance: No distress  Neuro:without focal findings, mental status, speech normal, alert and oriented, cranial nerves 2-12 intact, reflexes normal and symmetric, sensation grossly normal  HEENT: PERRLA, EOM intact, no ptosis, no other lesions  noticed; Mallampati 2 Pulmonary: normal breath sounds., diaphragmatic excursion normal.No wheezing, No rales;   Sputum Production:   CardiovascularNormal S1,S2.  No m/r/g.  Abdominal aorta pulsation normal.    Abdomen: Benign, Soft, non-tender, No masses, hepatosplenomegaly, No lymphadenopathy Renal:  No costovertebral tenderness  GU:  No performed at  this time. Endoc: No evident thyromegaly, no signs of acromegaly or Cushing features Skin:   warm, no rashes, no ecchymosis  Extremities: normal, no cyanosis, clubbing, no edema, warm with normal capillary refill. Other findings:   PFTs results:  03/02/14 PFT at this time by ATS criteria, is evident for:  Mild obstruction, no significant restriction   Pre-Bronch       Post-Bronch     FEV1: Actual 2.47   Predicted 82   Actual 2.54 Pred 85%  3%Chg  FEV1/FVC: Actual   Predicted 66%  Actual  Pred %  %Chg  RV: Actual 2.16   Predicted 86%     TLC: Actual 5.9   Predicted 90%     RV/TLC ([place])(%): Actual  Predicted 37%   VC: Actual 3.74   Predicted 85% DLCO2:cor: Actual 18.1   Predicted 78%   Rad results: (The following images and results were reviewed by Dr. Stevenson Clinch). CXR 08/2013 FINDINGS: The heart size and mediastinal contours are within normal limits. Both lungs are clear. The visualized skeletal structures are unremarkable.  IMPRESSION: No active cardiopulmonary disease.   EKG:     Other:   Assessment and Plan: No problem-specific assessment & plan notes found for this encounter.   Updated Medication List Outpatient Encounter Prescriptions as of 03/02/2014  Medication Sig  . ALPRAZolam (XANAX) 0.5 MG tablet Take 1 tablet (0.5 mg total) by mouth at bedtime as needed for anxiety.  Marland Kitchen amLODipine (NORVASC) 10 MG tablet Take 1 tablet (10 mg total) by mouth daily.  Marland Kitchen aspirin 81 MG tablet Take 1 tablet (81 mg total) by mouth daily.  Marland Kitchen atorvastatin (LIPITOR) 40 MG tablet Take 1 tablet (40 mg total) by mouth daily.  . carvedilol (COREG)  12.5 MG tablet Take 1 tablet (12.5 mg total) by mouth 2 (two) times daily with a meal.  . clopidogrel (PLAVIX) 75 MG tablet Take 1 tablet (75 mg total) by mouth daily.  . isosorbide mononitrate (IMDUR) 30 MG 24 hr tablet Take 1 tablet (30 mg total) by mouth daily.  . nitroGLYCERIN (NITROSTAT) 0.4 MG SL tablet Place 1 tablet (0.4 mg total) under the tongue every 5 (five) minutes as needed for chest pain.  Marland Kitchen omeprazole (PRILOSEC) 20 MG capsule Take 1 capsule (20 mg total) by mouth daily.  . Tiotropium Bromide Monohydrate (SPIRIVA RESPIMAT) 2.5 MCG/ACT AERS Inhale 2.5 mcg into the lungs daily.  . Tiotropium Bromide Monohydrate (SPIRIVA RESPIMAT) 2.5 MCG/ACT AERS Inhale 2.5 mcg into the lungs daily.  . [DISCONTINUED] sodium polystyrene (KAYEXALATE) 15 GM/60ML suspension Take 60 ml daily x 2 days. (Patient not taking: Reported on 03/02/2014)    Orders for this visit: Orders Placed This Encounter  Procedures  . Angiotensin converting enzyme    Standing Status: Future     Number of Occurrences:      Standing Expiration Date: 03/03/2015  . Comp Met (CMET)    Standing Status: Future     Number of Occurrences:      Standing Expiration Date: 03/03/2015    Order Specific Question:  Has the patient fasted?    Answer:  Yes     Thank  you for the consultation and for allowing [facility] to assist in the care of your patient. Our recommendations are noted above.  Please contact us if we can be of further service.   Vilinda Boehringer, MD [facility] Office Number: [phone]

## 2014-03-08 ENCOUNTER — Other Ambulatory Visit: Payer: Self-pay | Admitting: *Deleted

## 2014-03-08 ENCOUNTER — Telehealth: Payer: Self-pay | Admitting: Internal Medicine

## 2014-03-08 DIAGNOSIS — J449 Chronic obstructive pulmonary disease, unspecified: Secondary | ICD-10-CM

## 2014-03-08 MED ORDER — CLOPIDOGREL BISULFATE 75 MG PO TABS: 75.0000 mg | ORAL_TABLET | Freq: Every day | ORAL | Status: DC

## 2014-03-09 ENCOUNTER — Other Ambulatory Visit: Payer: Self-pay | Admitting: *Deleted

## 2014-03-09 DIAGNOSIS — D869 Sarcoidosis, unspecified: Secondary | ICD-10-CM

## 2014-03-15 ENCOUNTER — Encounter: Payer: Self-pay | Admitting: Internal Medicine

## 2014-03-15 NOTE — Assessment & Plan Note (Signed)
Differential diagnosis includes: Obstructive disease, restrictive disease, history of MI, cardiac issues, obesity, COPD.  Plan: - Most likely multifactorial-given his significant smoking history, with morning cough, may have a mild clinical COPD. Will plan for pulmonary function testing and will give a trial of Spiriva. - echo to evaluate for any valvular diastolic heart failure. - If no improvement at follow-up visit will consider CT chest with contrast for further evaluation - I doubt that the history of sarcoid is adding to his level of dyspnea given that he had a normal chest x-ray within the past year with no hilar adenopathy or interstitial processes.

## 2014-03-15 NOTE — Assessment & Plan Note (Signed)
Plan: - Patient with history of pulmonary sarcoid, however is now asymptomatic. - He had induced remission of his sarcoid with steroids for a short period of time - Based on history patient probably had stage I pulmonary sarcoid, which has a very favorable positive prognosis for spontaneous remission with a very low recurrence rate. However, induced remission still carries a small hance of recurrence, this rate is also very low. - yearly chest x-ray, pulmonary function testing (TLC, VC), ACEI level, annual eye exam, CMP - Obtain records from prior pulmonologist - His current pulmonary function testing show mild obstruction, with normal TLC and normal vital capacity. There is no significant restrictive process on his current pulmonary function testing

## 2014-03-20 NOTE — Telephone Encounter (Signed)
Message sent

## 2014-04-03 ENCOUNTER — Telehealth: Payer: Self-pay

## 2014-04-03 NOTE — Telephone Encounter (Signed)
The patient needs a [facility] ref for this upcoming eye dr apt

## 2014-04-04 ENCOUNTER — Other Ambulatory Visit: Payer: Commercial Managed Care - HMO

## 2014-04-13 ENCOUNTER — Ambulatory Visit: Payer: Commercial Managed Care - HMO | Admitting: Internal Medicine

## 2014-04-27 ENCOUNTER — Ambulatory Visit: Payer: Commercial Managed Care - HMO | Admitting: Internal Medicine

## 2014-07-11 ENCOUNTER — Other Ambulatory Visit: Payer: Self-pay | Admitting: Internal Medicine

## 2014-07-11 ENCOUNTER — Other Ambulatory Visit: Payer: Self-pay | Admitting: *Deleted

## 2014-07-11 MED ORDER — ISOSORBIDE MONONITRATE ER 30 MG PO TB24
30.0000 mg | ORAL_TABLET | Freq: Every day | ORAL | Status: DC
Start: 2014-07-11 — End: 2015-02-07

## 2014-07-11 MED ORDER — AMLODIPINE BESYLATE 10 MG PO TABS: 10.0000 mg | ORAL_TABLET | Freq: Every day | ORAL | Status: DC

## 2014-07-11 NOTE — Addendum Note (Signed)
Addended by: Wynonia Lawman E on: 07/11/2014 01:31 PM   Modules accepted: Orders

## 2014-07-20 ENCOUNTER — Telehealth: Payer: Self-pay | Admitting: *Deleted

## 2014-07-20 NOTE — Telephone Encounter (Signed)
°  1. Which medications need to be refilled? Nitro  2. Which pharmacy is medication to be sent to[facility]   3. Do they need a 30 day or 90 day supply? 90   4. Would they like a call back once the medication has been sent to the pharmacy? No

## 2014-07-21 MED ORDER — NITROGLYCERIN 0.4 MG SL SUBL: 0.4000 mg | SUBLINGUAL_TABLET | SUBLINGUAL | Status: DC | PRN

## 2014-07-21 NOTE — Telephone Encounter (Signed)
Refill sent for NTG tablets. The patient was instructed to make a follow up appointment since he is past due for May 2016. The patient has several issues with billing and is very upset and will call once he has the bills taking care of. I told the patient in order for [place] to continue to refill medications, he will have to be seen at least once a year.

## 2014-08-31 ENCOUNTER — Ambulatory Visit: Payer: Commercial Managed Care - HMO | Admitting: Internal Medicine

## 2014-09-06 ENCOUNTER — Ambulatory Visit: Payer: Commercial Managed Care - HMO | Admitting: Cardiovascular Disease

## 2014-10-02 ENCOUNTER — Other Ambulatory Visit: Payer: Self-pay | Admitting: Internal Medicine

## 2014-10-02 DIAGNOSIS — I2510 Atherosclerotic heart disease of native coronary artery without angina pectoris: Secondary | ICD-10-CM

## 2014-10-02 DIAGNOSIS — E785 Hyperlipidemia, unspecified: Secondary | ICD-10-CM

## 2014-10-02 NOTE — Telephone Encounter (Signed)
Spoke with pt to advise of MDs message in reference to no refills.  Pt refused to give date of birth for verification.  Pt became angry once told his refill request came from a local pharmacy and not [place] and continued to be and express his anger once told no refills would be granted.  MD aware

## 2014-10-02 NOTE — Telephone Encounter (Signed)
Last OV 12.17.15.  Please advise refill

## 2014-10-02 NOTE — Telephone Encounter (Signed)
Denied.  No refills until he has fasting  Labs.  Needs app scheduled as well but will fill once he has had labs

## 2014-10-12 ENCOUNTER — Ambulatory Visit: Payer: Commercial Managed Care - HMO | Admitting: Cardiovascular Disease

## 2015-02-06 ENCOUNTER — Telehealth: Payer: Self-pay | Admitting: Internal Medicine

## 2015-02-06 NOTE — Telephone Encounter (Signed)
Pt stated that he needs all of his meds refilled. Pt stated that he is very unhappy with this practice and that he was told that he can only get his rx at [facility] or no where else. Pt has an appt on 1/9. Please advise pt.msn

## 2015-02-07 MED ORDER — CARVEDILOL 12.5 MG PO TABS: 12.5000 mg | ORAL_TABLET | Freq: Two times a day (BID) | ORAL | Status: DC

## 2015-02-07 MED ORDER — NITROGLYCERIN 0.4 MG SL SUBL: 0.4000 mg | SUBLINGUAL_TABLET | SUBLINGUAL | Status: DC | PRN

## 2015-02-07 MED ORDER — ISOSORBIDE MONONITRATE ER 30 MG PO TB24: 30.0000 mg | ORAL_TABLET | Freq: Every day | ORAL | Status: DC

## 2015-02-07 MED ORDER — AMLODIPINE BESYLATE 10 MG PO TABS: 10.0000 mg | ORAL_TABLET | Freq: Every day | ORAL | Status: DC

## 2015-02-07 NOTE — Telephone Encounter (Signed)
I have refilled the Imdur, nitroglycerin , amlodipine and carvedilol.  But I will not refill the atorvastatin because he has not had liver enzymes checked in over a year.  This is not negotiable, and he can find himself another physician if he has a problem with my policy.

## 2015-02-07 NOTE — Telephone Encounter (Signed)
Patient was last seen by you last December on the 17th, he has had a few follow up appointments since which he has cancelled and not come in, please advise refilling medications as he is scheduled an appointment on January 9th.  Thanks

## 2015-02-19 ENCOUNTER — Encounter: Payer: Self-pay | Admitting: Internal Medicine

## 2015-02-19 ENCOUNTER — Ambulatory Visit (INDEPENDENT_AMBULATORY_CARE_PROVIDER_SITE_OTHER): Payer: Commercial Managed Care - HMO | Admitting: Internal Medicine

## 2015-02-19 VITALS — BP 186/100 | HR 80 | Temp 97.8°F | Resp 12 | Ht 69.0 in | Wt 197.2 lb

## 2015-02-19 DIAGNOSIS — J449 Chronic obstructive pulmonary disease, unspecified: Secondary | ICD-10-CM | POA: Insufficient documentation

## 2015-02-19 DIAGNOSIS — R05 Cough: Secondary | ICD-10-CM

## 2015-02-19 DIAGNOSIS — R059 Cough, unspecified: Secondary | ICD-10-CM

## 2015-02-19 DIAGNOSIS — R5383 Other fatigue: Secondary | ICD-10-CM

## 2015-02-19 DIAGNOSIS — I2510 Atherosclerotic heart disease of native coronary artery without angina pectoris: Secondary | ICD-10-CM | POA: Diagnosis not present

## 2015-02-19 DIAGNOSIS — N529 Male erectile dysfunction, unspecified: Secondary | ICD-10-CM | POA: Insufficient documentation

## 2015-02-19 DIAGNOSIS — E785 Hyperlipidemia, unspecified: Secondary | ICD-10-CM | POA: Diagnosis not present

## 2015-02-19 DIAGNOSIS — Z125 Encounter for screening for malignant neoplasm of prostate: Secondary | ICD-10-CM | POA: Diagnosis not present

## 2015-02-19 DIAGNOSIS — I10 Essential (primary) hypertension: Secondary | ICD-10-CM | POA: Diagnosis not present

## 2015-02-19 DIAGNOSIS — J438 Other emphysema: Secondary | ICD-10-CM

## 2015-02-19 MED ORDER — AMLODIPINE BESYLATE 10 MG PO TABS: 10.0000 mg | ORAL_TABLET | Freq: Every day | ORAL | Status: DC

## 2015-02-19 MED ORDER — CARVEDILOL 12.5 MG PO TABS: 12.5000 mg | ORAL_TABLET | Freq: Two times a day (BID) | ORAL | Status: DC

## 2015-02-19 MED ORDER — CARVEDILOL 12.5 MG PO TABS
12.5000 mg | ORAL_TABLET | Freq: Two times a day (BID) | ORAL | Status: DC
Start: 2015-02-19 — End: 2015-02-19

## 2015-02-19 MED ORDER — OMEPRAZOLE 20 MG PO CPDR: 20.0000 mg | DELAYED_RELEASE_CAPSULE | Freq: Every day | ORAL | Status: DC

## 2015-02-19 MED ORDER — ALPRAZOLAM 0.5 MG PO TABS: 0.5000 mg | ORAL_TABLET | Freq: Every evening | ORAL | Status: DC | PRN

## 2015-02-19 MED ORDER — CLOPIDOGREL BISULFATE 75 MG PO TABS: 75.0000 mg | ORAL_TABLET | Freq: Every day | ORAL | Status: DC

## 2015-02-19 MED ORDER — SILDENAFIL CITRATE 100 MG PO TABS: 50.0000 mg | ORAL_TABLET | Freq: Every day | ORAL | Status: DC | PRN

## 2015-02-19 NOTE — Patient Instructions (Addendum)
Try using Debrox for your itchy ear on the right  You can use Viagra AS LONG AS YOU ARE NOT USING ISOSORBIDE OR NITROGLYCERIN .  THE COMBINATION CAN DROP YOUR BLOOD PRESSURE AND CAUSE YOU TO FAINT   You need to see me every 6 months to prevent suspension of your medications again.  Please start back on amlodipine and the carvedilol immediately using the cash and the prescriptions I have given you today, to tide you over until your [facility] mail order comes .  [facility] [address] will fill both for $15   I will send the viagra to your mail order pharmacy

## 2015-02-19 NOTE — Progress Notes (Signed)
Subjective:  Patient ID: Ricky Abbott, male    DOB: 29-Aug-1945  Age: 70 y.o. MRN: [id number]  CC: The primary encounter diagnosis was Cough. Diagnoses of Prostate cancer screening, Hyperlipidemia, Essential hypertension, Other fatigue, CAD, multiple vessel, Other emphysema ([place]), COPD, mild ([place]), and Erectile dysfunction, unspecified erectile dysfunction type were also pertinent to this visit.  HPI Ricky Abbott presents for follow up on chronic issues including hypertension, hyperlipidemia, and CAD. History of PTSD with frequent anger outbursts, already had a disagreement with front office before coming to the room .  Marland KitchenLast visit Dec 2015.  No LFTS since then      He is angry because he has been out of all medications for over a month, because he missed his 6 month follow up and could not afford to get the one month supply at local pharmacy   Having difficultly with sustaining an erection for more than one minute. Wants to have a medication.  Takes Imdur but has been without it for more than a month and has had no chest pain with physically strenous work.    Last cardiology appt Nov 2015 Pulm eval Jan 2016 for history of sarcoid  .  Seen by [facility]  .  PFTS done , suggested mild COPD consistent with history,  No follow up done    Outpatient Prescriptions Prior to Visit  Medication Sig Dispense Refill  . aspirin 81 MG tablet Take 81 mg by mouth daily. Reported on 02/19/2015    . atorvastatin (LIPITOR) 40 MG tablet Take 1 tablet (40 mg total) by mouth daily. (Patient not taking: Reported on 02/19/2015) 90 tablet 3  . nitroGLYCERIN (NITROSTAT) 0.4 MG SL tablet Place 1 tablet (0.4 mg total) under the tongue every 5 (five) minutes as needed for chest pain. (Patient not taking: Reported on 02/19/2015) 25 tablet 1  . Tiotropium Bromide Monohydrate (SPIRIVA RESPIMAT) 2.5 MCG/ACT AERS Inhale 2.5 mcg into the lungs daily. (Patient not taking: Reported on 02/19/2015) 3 Inhaler 3  . Tiotropium Bromide  Monohydrate (SPIRIVA RESPIMAT) 2.5 MCG/ACT AERS Inhale 2.5 mcg into the lungs daily. (Patient not taking: Reported on 02/19/2015) 1 Inhaler 0  . ALPRAZolam (XANAX) 0.5 MG tablet Take 1 tablet (0.5 mg total) by mouth at bedtime as needed for anxiety. (Patient not taking: Reported on 02/19/2015) 30 tablet 0  . amLODipine (NORVASC) 10 MG tablet Take 1 tablet (10 mg total) by mouth daily. (Patient not taking: Reported on 02/19/2015) 90 tablet 1  . carvedilol (COREG) 12.5 MG tablet Take 1 tablet (12.5 mg total) by mouth 2 (two) times daily with a meal. (Patient not taking: Reported on 02/19/2015) 180 tablet 1  . clopidogrel (PLAVIX) 75 MG tablet Take 1 tablet (75 mg total) by mouth daily. (Patient not taking: Reported on 02/19/2015) 90 tablet 1  . isosorbide mononitrate (IMDUR) 30 MG 24 hr tablet Take 1 tablet (30 mg total) by mouth daily. (Patient not taking: Reported on 02/19/2015) 90 tablet 1  . omeprazole (PRILOSEC) 20 MG capsule Take 1 capsule (20 mg total) by mouth daily. (Patient not taking: Reported on 02/19/2015) 90 capsule 1   No facility-administered medications prior to visit.    Review of Systems;  Patient denies headache, fevers, malaise, unintentional weight loss, skin rash, eye pain, sinus congestion and sinus pain, sore throat, dysphagia,  hemoptysis , cough, dyspnea, wheezing, chest pain, palpitations, orthopnea, edema, abdominal pain, nausea, melena, diarrhea, constipation, flank pain, dysuria, hematuria, urinary  Frequency, nocturia, numbness, tingling, seizures,  Focal  weakness, Loss of consciousness,  Tremor, insomnia, depression, anxiety, and suicidal ideation.      Objective:  BP 186/100 mmHg  Pulse 80  Temp(Src) 97.8 F (36.6 C) (Oral)  Resp 12  Ht 5\' 9"  (1.753 m)  Wt 197 lb 4 oz (89.472 kg)  BMI 29.12 kg/m2  SpO2 97%  BP Readings from Last 3 Encounters:  02/19/15 186/100  03/02/14 106/60  01/26/14 110/70    Wt Readings from Last 3 Encounters:  02/19/15 197 lb 4 oz (89.472  kg)  03/02/14 199 lb 12.8 oz (90.629 kg)  01/26/14 204 lb (92.534 kg)    General appearance: alert, cooperative and appears stated age Ears: normal TM's and external ear canals both ears Throat: lips, mucosa, and tongue normal; teeth and gums normal Neck: no adenopathy, no carotid bruit, supple, symmetrical, trachea midline and thyroid not enlarged, symmetric, no tenderness/mass/nodules Back: symmetric, no curvature. ROM normal. No CVA tenderness. Lungs: clear to auscultation bilaterally Heart: regular rate and rhythm, S1, S2 normal, no murmur, click, rub or gallop Abdomen: soft, non-tender; bowel sounds normal; no masses,  no organomegaly Pulses: 2+ and symmetric Skin: Skin color, texture, turgor normal. No rashes or lesions Lymph nodes: Cervical, supraclavicular, and axillary nodes normal.  No results found for: HGBA1C  Lab Results  Component Value Date   CREATININE 1.0 01/26/2014   CREATININE 1.0 12/15/2013   CREATININE 0.9 08/15/2013    Lab Results  Component Value Date   WBC 7.9 08/15/2013   HGB 14.9 08/15/2013   HCT 44.1 08/15/2013   PLT 311.0 08/15/2013   GLUCOSE 118* 01/26/2014   CHOL 193 01/26/2014   TRIG 141.0 01/26/2014   HDL 26.60* 01/26/2014   LDLCALC 138* 01/26/2014   ALT 21 01/26/2014   AST 18 01/26/2014   NA 139 01/26/2014   K 5.0 01/26/2014   CL 105 01/26/2014   CREATININE 1.0 01/26/2014   BUN 22 01/26/2014   CO2 24 01/26/2014   TSH 1.70 08/15/2013   PSA 0.86 12/15/2013    No results found.  Assessment & Plan:   Problem List Items Addressed This Visit    Essential hypertension    Patient was given $20 to get amlodopine and carvedilol filled locally       Relevant Medications   sildenafil (VIAGRA) 100 MG tablet   amLODipine (NORVASC) 10 MG tablet   carvedilol (COREG) 12.5 MG tablet   COPD, mild (HCC)    Mild,  byb  PFTS done in Jan 2016 during pulmonary eval..  Currently asymptomati      RESOLVED: COPD (chronic obstructive pulmonary  disease) ([place])   Erectile dysfunction    He has difficulty sustaining an erection for > 1 minute,  Trial of Viagra,  Imdur discontinued.       CAD, multiple vessel   Relevant Medications   sildenafil (VIAGRA) 100 MG tablet   amLODipine (NORVASC) 10 MG tablet   carvedilol (COREG) 12.5 MG tablet   Other fatigue   Relevant Orders   TSH    Other Visit Diagnoses    Cough    -  Primary    Relevant Medications    omeprazole (PRILOSEC) 20 MG capsule    Prostate cancer screening        Relevant Orders    PSA, Medicare    Hyperlipidemia        Relevant Medications    sildenafil (VIAGRA) 100 MG tablet    amLODipine (NORVASC) 10 MG tablet    carvedilol (COREG) 12.5 MG  tablet       I have discontinued Mr. Desautel's isosorbide mononitrate. I am also having him start on sildenafil. Additionally, I am having him maintain his aspirin, atorvastatin, Tiotropium Bromide Monohydrate, Tiotropium Bromide Monohydrate, nitroGLYCERIN, omeprazole, clopidogrel, amLODipine, ALPRAZolam, and carvedilol.  Meds ordered this encounter  Medications  . sildenafil (VIAGRA) 100 MG tablet    Sig: Take 0.5-1 tablets (50-100 mg total) by mouth daily as needed for erectile dysfunction.    Dispense:  15 tablet    Refill:  3    90 day supply  . DISCONTD: carvedilol (COREG) 12.5 MG tablet    Sig: Take 1 tablet (12.5 mg total) by mouth 2 (two) times daily with a meal.    Dispense:  30 tablet    Refill:  0  . DISCONTD: amLODipine (NORVASC) 10 MG tablet    Sig: Take 1 tablet (10 mg total) by mouth daily.    Dispense:  30 tablet    Refill:  1  . omeprazole (PRILOSEC) 20 MG capsule    Sig: Take 1 capsule (20 mg total) by mouth daily.    Dispense:  90 capsule    Refill:  1  . clopidogrel (PLAVIX) 75 MG tablet    Sig: Take 1 tablet (75 mg total) by mouth daily.    Dispense:  90 tablet    Refill:  1  . DISCONTD: carvedilol (COREG) 12.5 MG tablet    Sig: Take 1 tablet (12.5 mg total) by mouth 2 (two) times daily  with a meal.    Dispense:  30 tablet    Refill:  0  . DISCONTD: carvedilol (COREG) 12.5 MG tablet    Sig: Take 1 tablet (12.5 mg total) by mouth 2 (two) times daily with a meal.    Dispense:  180 tablet    Refill:  0  . amLODipine (NORVASC) 10 MG tablet    Sig: Take 1 tablet (10 mg total) by mouth daily.    Dispense:  90 tablet    Refill:  1  . ALPRAZolam (XANAX) 0.5 MG tablet    Sig: Take 1 tablet (0.5 mg total) by mouth at bedtime as needed for anxiety.    Dispense:  90 tablet    Refill:  1  . carvedilol (COREG) 12.5 MG tablet    Sig: Take 1 tablet (12.5 mg total) by mouth 2 (two) times daily with a meal.    Dispense:  180 tablet    Refill:  0    Medications Discontinued During This Encounter  Medication Reason  . isosorbide mononitrate (IMDUR) 30 MG 24 hr tablet   . carvedilol (COREG) 12.5 MG tablet Reorder  . amLODipine (NORVASC) 10 MG tablet Reorder  . omeprazole (PRILOSEC) 20 MG capsule Reorder  . clopidogrel (PLAVIX) 75 MG tablet Reorder  . carvedilol (COREG) 12.5 MG tablet Reorder  . carvedilol (COREG) 12.5 MG tablet Reorder  . amLODipine (NORVASC) 10 MG tablet Reorder  . ALPRAZolam (XANAX) 0.5 MG tablet Reorder  . carvedilol (COREG) 12.5 MG tablet Reorder    Follow-up: No Follow-up on file.   Crecencio Mc, MD

## 2015-02-19 NOTE — Assessment & Plan Note (Signed)
Patient was given $20 to get amlodopine and carvedilol filled locally

## 2015-02-19 NOTE — Progress Notes (Signed)
Pre-visit discussion using our clinic review tool. No additional management support is needed unless otherwise documented below in the visit note.  

## 2015-02-19 NOTE — Assessment & Plan Note (Signed)
Mild,  byb  PFTS done in Jan 2016 during pulmonary eval..  Currently asymptomati

## 2015-02-19 NOTE — Assessment & Plan Note (Signed)
He has difficulty sustaining an erection for > 1 minute,  Trial of Viagra,  Imdur discontinued.

## 2015-02-20 LAB — TSH: TSH: 0.95 u[IU]/mL (ref 0.35–4.50)

## 2015-02-20 LAB — PSA, MEDICARE: PSA: 1.82 ng/ml (ref 0.10–4.00)

## 2015-02-22 ENCOUNTER — Telehealth: Payer: Self-pay | Admitting: Internal Medicine

## 2015-02-22 NOTE — Telephone Encounter (Signed)
[facility] called and stated they could not add the Cmet and the lipid. We will have to call patient. FYI

## 2015-02-22 NOTE — Telephone Encounter (Signed)
Ricky Abbott,  This patient had several  labs ordered, but several were missed and  not collected at the time he came to you. .  Can you ask him to return for the CMT and fasting lipid?  You will also need to waive the collection fee because it was the lab's mistake. Thank you

## 2015-02-23 ENCOUNTER — Other Ambulatory Visit (INDEPENDENT_AMBULATORY_CARE_PROVIDER_SITE_OTHER): Payer: Commercial Managed Care - HMO

## 2015-02-23 DIAGNOSIS — R7989 Other specified abnormal findings of blood chemistry: Secondary | ICD-10-CM

## 2015-02-23 DIAGNOSIS — R937 Abnormal findings on diagnostic imaging of other parts of musculoskeletal system: Secondary | ICD-10-CM | POA: Diagnosis not present

## 2015-02-23 LAB — LIPID PANEL
CHOL/HDL RATIO: 7
CHOLESTEROL: 287 mg/dL — AB (ref 0–200)
HDL: 43.8 mg/dL (ref >39.00)
NonHDL: 242.85
TRIGLYCERIDES: 246 mg/dL — AB (ref 0.0–149.0)
VLDL: 49.2 mg/dL — ABNORMAL HIGH (ref 0.0–40.0)

## 2015-02-23 LAB — LDL CHOLESTEROL, DIRECT: Direct LDL: 220 mg/dL

## 2015-02-23 NOTE — Telephone Encounter (Signed)
It shows that you cancelled the cbc, cmet and lipid?   This Order Has Been Canceled     Order Status By On Reason    Canceled Crecencio Mc, MD 02/19/15 1607 none     The cmet cant be add the only thing that can be added is the lipid

## 2015-02-23 NOTE — Telephone Encounter (Signed)
The original orders that were done as futures were not dropped so he needs a cmet and lipid .  The ones palced same day were cancelled appropriately.

## 2015-02-27 ENCOUNTER — Other Ambulatory Visit (INDEPENDENT_AMBULATORY_CARE_PROVIDER_SITE_OTHER): Payer: Commercial Managed Care - HMO

## 2015-02-27 DIAGNOSIS — I2510 Atherosclerotic heart disease of native coronary artery without angina pectoris: Secondary | ICD-10-CM

## 2015-02-27 LAB — COMPREHENSIVE METABOLIC PANEL
ALBUMIN: 4.3 g/dL (ref 3.5–5.2)
ALT: 25 U/L (ref 0–53)
AST: 20 U/L (ref 0–37)
Alkaline Phosphatase: 68 U/L (ref 39–117)
BILIRUBIN TOTAL: 0.7 mg/dL (ref 0.2–1.2)
BUN: 16 mg/dL (ref 6–23)
CALCIUM: 9.5 mg/dL (ref 8.4–10.5)
CHLORIDE: 103 meq/L (ref 96–112)
CO2: 27 meq/L (ref 19–32)
CREATININE: 0.9 mg/dL (ref 0.40–1.50)
GFR: 88.77 mL/min (ref >60.00)
Glucose, Bld: 134 mg/dL — ABNORMAL HIGH (ref 70–99)
Potassium: 4.4 mEq/L (ref 3.5–5.1)
Sodium: 138 mEq/L (ref 135–145)
Total Protein: 7.6 g/dL (ref 6.0–8.3)

## 2015-02-28 ENCOUNTER — Encounter: Payer: Self-pay | Admitting: Internal Medicine

## 2015-02-28 ENCOUNTER — Telehealth: Payer: Self-pay

## 2015-02-28 MED ORDER — ATORVASTATIN CALCIUM 40 MG PO TABS: 40.0000 mg | ORAL_TABLET | Freq: Every day | ORAL | Status: DC

## 2015-02-28 NOTE — Addendum Note (Signed)
Addended by: Crecencio Mc on: 02/28/2015 01:21 PM   Modules accepted: Orders, Medications

## 2015-02-28 NOTE — Telephone Encounter (Signed)
Notified pt of Dr.Tullo's message, and pt states that his computer is not working and that would not like to use mychart in the future for ant reason./tvw

## 2015-03-14 NOTE — Telephone Encounter (Signed)
Mailed unread message to patient.  

## 2015-04-04 ENCOUNTER — Telehealth: Payer: Self-pay

## 2015-04-04 ENCOUNTER — Telehealth: Payer: Self-pay | Admitting: *Deleted

## 2015-04-04 ENCOUNTER — Ambulatory Visit (INDEPENDENT_AMBULATORY_CARE_PROVIDER_SITE_OTHER): Payer: Commercial Managed Care - HMO | Admitting: Cardiovascular Disease

## 2015-04-04 ENCOUNTER — Ambulatory Visit: Payer: Self-pay | Admitting: Internal Medicine

## 2015-04-04 ENCOUNTER — Encounter: Payer: Self-pay | Admitting: Cardiovascular Disease

## 2015-04-04 VITALS — BP 110/64 | HR 77 | Ht 70.5 in | Wt 198.2 lb

## 2015-04-04 DIAGNOSIS — E785 Hyperlipidemia, unspecified: Secondary | ICD-10-CM | POA: Diagnosis not present

## 2015-04-04 DIAGNOSIS — F4310 Post-traumatic stress disorder, unspecified: Secondary | ICD-10-CM

## 2015-04-04 DIAGNOSIS — I10 Essential (primary) hypertension: Secondary | ICD-10-CM

## 2015-04-04 DIAGNOSIS — I2510 Atherosclerotic heart disease of native coronary artery without angina pectoris: Secondary | ICD-10-CM | POA: Diagnosis not present

## 2015-04-04 MED ORDER — ATORVASTATIN CALCIUM 80 MG PO TABS
80.0000 mg | ORAL_TABLET | Freq: Every day | ORAL | Status: DC
Start: 2015-04-04 — End: 2016-04-21

## 2015-04-04 NOTE — Assessment & Plan Note (Signed)
Blood pressure is well controlled on today's visit. No changes made to the medications. 

## 2015-04-04 NOTE — Telephone Encounter (Signed)
[employer] called and asked if they could get a verbal order for pt's xanax was sent to the wrong address. Pharmacy states that they do not need a new script just a verbal order. Please advise, thanks

## 2015-04-04 NOTE — Telephone Encounter (Signed)
Attempted to call patient, he was given fill on this medication on 02/19/15 for #90.  That should last until 4/9?  This is to early.  Need to clarify

## 2015-04-04 NOTE — Assessment & Plan Note (Signed)
Even on the Lipitor 40 mg daily, numbers were above goal. We have recommended he increase Lipitor up to 80 mg daily May need to add zetia at a later date

## 2015-04-04 NOTE — Assessment & Plan Note (Signed)
Currently with no symptoms of angina. No further workup at this time. Continue current medication regimen. 

## 2015-04-04 NOTE — Progress Notes (Signed)
Patient ID: Ricky Abbott, male    DOB: 08-07-1945, 70 y.o.   MRN: [id number]  HPI Comments: 70 yo caucasian male with notes detailing a hx of  Sarcoidosis, and coronary artery disease with stent placed 20 years ago in [place], [place] in April 2014 presenting to [facility] with stent placed at that time  History of smoking, quit in 1984, history of alcohol, depression. Long history of drinking 40 ounces beers, quit recently per the patient Presents for routine follow-up for of his coronary artery disease Prior history of hepatitis, treated, also history of PTSD, fought in [place]  In follow-up today, he was out of his medications for a period of time, approximately one month Now back on his medications. Denies any significant chest pain or shortness of breath on exertion  Lab work reviewed with him showing total cholesterol 287, LDL 220 (January 2017)  He is requesting Xanax Recently had recurrence of his bad dreams, PTSD from [place] Previously had Xanax which helped him sleep  EKG on today's visit shows normal sinus rhythm with rate 77 bpm, right bundle branch block, no significant ST or T-wave changes  Other past medical history Previously on pravastatin with improvement of his cholesterol from 272 down to 236 changed to Lipitor but has not started yet Previously on medication for PTSD. Recent problem with his drinking and PTSD, outburst in his apartment complex, neighbors got mad at him   he reports having back pain and chest pain April 2014. He was taken to [facility], transferred by helicopter to [facility].  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 Has never been treated for this.    No Known Allergies  Current Outpatient Prescriptions on File Prior to Visit  Medication Sig Dispense Refill  . ALPRAZolam (XANAX) 0.5 MG tablet Take 1 tablet (0.5 mg total) by mouth at bedtime as needed for anxiety. 90 tablet 1  . amLODipine (NORVASC) 10 MG tablet Take 1 tablet (10 mg total) by  mouth daily. 90 tablet 1  . aspirin 81 MG tablet Take 81 mg by mouth daily. Reported on 02/19/2015    . carvedilol (COREG) 12.5 MG tablet Take 1 tablet (12.5 mg total) by mouth 2 (two) times daily with a meal. 180 tablet 0  . clopidogrel (PLAVIX) 75 MG tablet Take 1 tablet (75 mg total) by mouth daily. 90 tablet 1  . nitroGLYCERIN (NITROSTAT) 0.4 MG SL tablet Place 1 tablet (0.4 mg total) under the tongue every 5 (five) minutes as needed for chest pain. 25 tablet 1  . omeprazole (PRILOSEC) 20 MG capsule Take 1 capsule (20 mg total) by mouth daily. 90 capsule 1  . sildenafil (VIAGRA) 100 MG tablet Take 0.5-1 tablets (50-100 mg total) by mouth daily as needed for erectile dysfunction. 15 tablet 3  . Tiotropium Bromide Monohydrate (SPIRIVA RESPIMAT) 2.5 MCG/ACT AERS Inhale 2.5 mcg into the lungs daily. 3 Inhaler 3  . Tiotropium Bromide Monohydrate (SPIRIVA RESPIMAT) 2.5 MCG/ACT AERS Inhale 2.5 mcg into the lungs daily. 1 Inhaler 0   No current facility-administered medications on file prior to visit.    Past Medical History  Diagnosis Date  . Diabetes mellitus without complication ([place])   . Hypertension   . Hyperlipidemia   . Allergy   . History of chicken pox   . CAD, multiple vessel   . Sarcoidosis of lung ([place])   . PTSD (post-traumatic stress disorder)     [place]  . MI (myocardial infarction) ([place])   .  Asthma     Past Surgical History  Procedure Laterality Date  . Coronary stent placement  1998, 05/2012    x 2  . Cardiac catheterization      [place] [place]  . Cardiac catheterization      [facility]     Social History  reports that he quit smoking about 34 years ago. His smoking use included Cigarettes. He has a 100 pack-year smoking history. He has never used smokeless tobacco. He reports that he drinks about 8.4 oz of alcohol per week. He reports that he does not use illicit drugs.  Family History family history includes Cancer in his brother, father, and mother;  Hyperlipidemia in his father and mother; Hypertension in his father and mother.    Review of Systems  Constitutional: Negative.   Respiratory: Negative.   Cardiovascular: Negative.   Musculoskeletal: Negative.        Leg pain with exertion  Neurological: Negative.   Psychiatric/Behavioral: Positive for sleep disturbance.  All other systems reviewed and are negative.   BP 110/64 mmHg  Pulse 77  Ht 5' 10.5" (1.791 m)  Wt 198 lb 4 oz (89.926 kg)  BMI 28.03 kg/m2  Physical Exam  Constitutional: He is oriented to person, place, and time. He appears well-developed and well-nourished.  HENT:  Head: Normocephalic.  Nose: Nose normal.  Mouth/Throat: Oropharynx is clear and moist.  Eyes: Conjunctivae are normal. Pupils are equal, round, and reactive to light.  Neck: Normal range of motion. Neck supple. No JVD present.  Cardiovascular: Normal rate, regular rhythm, S1 normal, S2 normal, normal heart sounds and intact distal pulses.  Exam reveals no gallop and no friction rub.   No murmur heard. Pulmonary/Chest: Effort normal and breath sounds normal. No respiratory distress. He has no wheezes. He has no rales. He exhibits no tenderness.  Abdominal: Soft. Bowel sounds are normal. He exhibits no distension. There is no tenderness.  Musculoskeletal: Normal range of motion. He exhibits no edema or tenderness.  Lymphadenopathy:    He has no cervical adenopathy.  Neurological: He is alert and oriented to person, place, and time. Coordination normal.  Skin: Skin is warm and dry. No rash noted. No erythema.  Psychiatric: He has a normal mood and affect. His behavior is normal. Judgment and thought content normal.      Assessment and Plan   Nursing note and vitals reviewed.

## 2015-04-04 NOTE — Assessment & Plan Note (Signed)
Requesting xanax. Suggested he talk with Dr. Derrel Nip. He may need psychiatry.

## 2015-04-04 NOTE — Telephone Encounter (Signed)
Medication refill for Xanax for his [place] nightmares.  [place]

## 2015-04-04 NOTE — Patient Instructions (Signed)
You are doing well.  Please increase the lipitor up to 80 mg daily  Please call us if you have new issues that need to be addressed before your next appt.  Your physician wants you to follow-up in: 12 months.  You will receive a reminder letter in the mail two months in advance. If you don't receive a letter, please call our office to schedule the follow-up appointment.

## 2015-04-05 NOTE — Telephone Encounter (Signed)
Pt states that the called the pharmacy and [employer] to see where his Rx was and found out that his xanax Rx was sent to the wrong address, the pharmacy then called to confirm his address and wanted to send him the same Rx to his new address. The pharmacy the called the office to let us know they made an error in sending the pt's medication to the wrong address, [employer] wanted to let us know.

## 2015-04-05 NOTE — Telephone Encounter (Signed)
Very strange request .  rx was sent to [facility].  Need more info, or Talk to [place] about this one. plesa ecall patient to see when they last received rx

## 2015-05-21 ENCOUNTER — Ambulatory Visit (INDEPENDENT_AMBULATORY_CARE_PROVIDER_SITE_OTHER): Payer: Commercial Managed Care - HMO | Admitting: Internal Medicine

## 2015-05-21 ENCOUNTER — Encounter: Payer: Self-pay | Admitting: Internal Medicine

## 2015-05-21 VITALS — BP 130/78 | HR 73 | Temp 98.0°F | Resp 12 | Ht 71.0 in | Wt 201.2 lb

## 2015-05-21 DIAGNOSIS — F4312 Post-traumatic stress disorder, chronic: Secondary | ICD-10-CM | POA: Diagnosis not present

## 2015-05-21 DIAGNOSIS — E538 Deficiency of other specified B group vitamins: Secondary | ICD-10-CM

## 2015-05-21 DIAGNOSIS — E1142 Type 2 diabetes mellitus with diabetic polyneuropathy: Secondary | ICD-10-CM | POA: Insufficient documentation

## 2015-05-21 DIAGNOSIS — E785 Hyperlipidemia, unspecified: Secondary | ICD-10-CM | POA: Diagnosis not present

## 2015-05-21 DIAGNOSIS — E119 Type 2 diabetes mellitus without complications: Secondary | ICD-10-CM | POA: Diagnosis not present

## 2015-05-21 DIAGNOSIS — E1165 Type 2 diabetes mellitus with hyperglycemia: Secondary | ICD-10-CM | POA: Insufficient documentation

## 2015-05-21 LAB — HM DIABETES FOOT EXAM: HM DIABETIC FOOT EXAM: NORMAL

## 2015-05-21 MED ORDER — LORAZEPAM 0.5 MG PO TABS: 0.5000 mg | ORAL_TABLET | Freq: Every day | ORAL | Status: DC

## 2015-05-21 MED ORDER — LORAZEPAM 0.5 MG PO TABS: 0.5000 mg | ORAL_TABLET | Freq: Two times a day (BID) | ORAL | Status: DC | PRN

## 2015-05-21 MED ORDER — CARVEDILOL 12.5 MG PO TABS: 12.5000 mg | ORAL_TABLET | Freq: Two times a day (BID) | ORAL | Status: DC

## 2015-05-21 MED ORDER — SERTRALINE HCL 50 MG PO TABS: 50.0000 mg | ORAL_TABLET | Freq: Every day | ORAL | Status: DC

## 2015-05-21 NOTE — Progress Notes (Signed)
Subjective:  Patient ID: Ricky Abbott, male    DOB: [birth year]  Age: 70 y.o. MRN: [id number]  CC: The primary encounter diagnosis was Diet-controlled diabetes mellitus ([place]). Diagnoses of Hyperlipidemia, B12 deficiency, Chronic post-traumatic stress disorder (PTSD), and HLD (hyperlipidemia) were also pertinent to this visit.  HPI Ricky Abbott presents for follow up on hypertension , hyperlipidemia and CAD .   Hyperlipidemia: Dr  Rockey Situ increased the lipitor dose  to 80 mg  And started the higher dose  4 weeks ago .  He is tolerating the higher dose.   Type 2 DM:  Diet controlled for years.  No recent A1c  Needs Fasting labs next week along with A1c .   Positive screen for depression .  Girlfriend left him to go back to old boyfriend. Feels emotionally estranged from his own family both here and in [place].  They were not recently  supportive during a crisis when he needed financial help.   He is bitter about their lack of loyalty after years of him supporting them when he was financially able .    Taking alprazolam at bedtime.  Wakes up with a big "POP" in his head. And then can't go back to sleep.  No other new medications . 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  Becomes physically aggressive and combative when he drinks so he has been aoiding use of alcohol.   Outpatient Prescriptions Prior to Visit  Medication Sig Dispense Refill  . amLODipine (NORVASC) 10 MG tablet Take 1 tablet (10 mg total) by mouth daily. 90 tablet 1  . aspirin 81 MG tablet Take 81 mg by mouth daily. Reported on 02/19/2015    . atorvastatin (LIPITOR) 80 MG tablet Take 1 tablet (80 mg total) by mouth daily. 90 tablet 4  . clopidogrel (PLAVIX) 75 MG tablet Take 1 tablet (75 mg total) by mouth daily. 90 tablet 1  . nitroGLYCERIN (NITROSTAT) 0.4 MG SL tablet Place 1 tablet (0.4 mg total) under the tongue every 5 (five) minutes as needed for chest pain. 25 tablet 1  . omeprazole (PRILOSEC) 20 MG capsule  Take 1 capsule (20 mg total) by mouth daily. 90 capsule 1  . ALPRAZolam (XANAX) 0.5 MG tablet Take 1 tablet (0.5 mg total) by mouth at bedtime as needed for anxiety. 90 tablet 1  . carvedilol (COREG) 12.5 MG tablet Take 1 tablet (12.5 mg total) by mouth 2 (two) times daily with a meal. 180 tablet 0  . Tiotropium Bromide Monohydrate (SPIRIVA RESPIMAT) 2.5 MCG/ACT AERS Inhale 2.5 mcg into the lungs daily. (Patient not taking: Reported on 05/21/2015) 3 Inhaler 3  . sildenafil (VIAGRA) 100 MG tablet Take 0.5-1 tablets (50-100 mg total) by mouth daily as needed for erectile dysfunction. (Patient not taking: Reported on 05/21/2015) 15 tablet 3  . Tiotropium Bromide Monohydrate (SPIRIVA RESPIMAT) 2.5 MCG/ACT AERS Inhale 2.5 mcg into the lungs daily. (Patient not taking: Reported on 05/21/2015) 1 Inhaler 0   No facility-administered medications prior to visit.    Review of Systems;  Patient denies headache, fevers, malaise, unintentional weight loss, skin rash, eye pain, sinus congestion and sinus pain, sore throat, dysphagia,  hemoptysis , cough, dyspnea, wheezing, chest pain, palpitations, orthopnea, edema, abdominal pain, nausea, melena, diarrhea, constipation, flank pain, dysuria, hematuria, urinary  Frequency, nocturia, numbness, tingling, seizures,  Focal weakness, Loss of consciousness,  Tremor, insomnia, depression, anxiety, and suicidal ideation.      Objective:  BP 130/78 mmHg  Pulse 73  Temp(Src) 98 F (36.7 C) (Oral)  Resp 12  Ht 5\' 11"  (1.803 m)  Wt 201 lb 4 oz (91.286 kg)  BMI 28.08 kg/m2  SpO2 95%  BP Readings from Last 3 Encounters:  05/21/15 130/78  04/04/15 110/64  02/19/15 186/100    Wt Readings from Last 3 Encounters:  05/21/15 201 lb 4 oz (91.286 kg)  04/04/15 198 lb 4 oz (89.926 kg)  02/19/15 197 lb 4 oz (89.472 kg)    General appearance: alert, cooperative and appears stated age Ears: normal TM's and external ear canals both ears Throat: lips, mucosa, and tongue  normal; teeth and gums normal Neck: no adenopathy, no carotid bruit, supple, symmetrical, trachea midline and thyroid not enlarged, symmetric, no tenderness/mass/nodules Back: symmetric, no curvature. ROM normal. No CVA tenderness. Lungs: clear to auscultation bilaterally Heart: regular rate and rhythm, S1, S2 normal, no murmur, click, rub or gallop Abdomen: soft, non-tender; bowel sounds normal; no masses,  no organomegaly Pulses: 2+ and symmetric Skin: Skin color, texture, turgor normal. No rashes or lesions Lymph nodes: Cervical, supraclavicular, and axillary nodes normal.  No results found for: HGBA1C  Lab Results  Component Value Date   CREATININE 0.90 02/27/2015   CREATININE 1.0 01/26/2014   CREATININE 1.0 12/15/2013    Lab Results  Component Value Date   WBC 7.9 08/15/2013   HGB 14.9 08/15/2013   HCT 44.1 08/15/2013   PLT 311.0 08/15/2013   GLUCOSE 134* 02/27/2015   CHOL 287* 02/23/2015   TRIG 246.0* 02/23/2015   HDL 43.80 02/23/2015   LDLDIRECT 220.0 02/23/2015   LDLCALC 138* 01/26/2014   ALT 25 02/27/2015   AST 20 02/27/2015   NA 138 02/27/2015   K 4.4 02/27/2015   CL 103 02/27/2015   CREATININE 0.90 02/27/2015   BUN 16 02/27/2015   CO2 27 02/27/2015   TSH 0.95 02/19/2015   PSA 1.82 02/19/2015    No results found.  Assessment & Plan:   Problem List Items Addressed This Visit    HLD (hyperlipidemia)    He was changed to high potency lipitor several weeks ago and will return to have fasting labs next week.      Relevant Medications   carvedilol (COREG) 12.5 MG tablet   Diet-controlled diabetes mellitus ([place]) - Primary    He will return for fasting labs and A!c next week       Relevant Orders   Ambulatory referral to Ophthalmology   Comprehensive metabolic panel   Hemoglobin A1c   Lipid panel   Microalbumin / creatinine urine ratio   Chronic post-traumatic stress disorder (PTSD)    Change alprazolam to lorazepam for management on nocturnal issues  including insomnia , and adding zoloft for management of anxeity/depression        Other Visit Diagnoses    Hyperlipidemia        Relevant Medications    carvedilol (COREG) 12.5 MG tablet    Other Relevant Orders    LDL cholesterol, direct    B12 deficiency        Relevant Orders    Vitamin B12      A total of 40 minutes was spent with patient more than half of which was spent in counseling patient on the above mentioned issues , reviewing and explaining recent labs and imaging studies done, and coordination of care.  I have discontinued Mr. Termini's sildenafil and ALPRAZolam. I have also changed his LORazepam. Additionally, I am having him start on sertraline. Lastly, I am  having him maintain his aspirin, Tiotropium Bromide Monohydrate, nitroGLYCERIN, omeprazole, clopidogrel, amLODipine, atorvastatin, and carvedilol.  Meds ordered this encounter  Medications  . DISCONTD: LORazepam (ATIVAN) 0.5 MG tablet    Sig: Take 1 tablet (0.5 mg total) by mouth 2 (two) times daily as needed for anxiety.    Dispense:  90 tablet    Refill:  1  . sertraline (ZOLOFT) 50 MG tablet    Sig: Take 1 tablet (50 mg total) by mouth daily.    Dispense:  90 tablet    Refill:  1  . LORazepam (ATIVAN) 0.5 MG tablet    Sig: Take 1 tablet (0.5 mg total) by mouth at bedtime.    Dispense:  90 tablet    Refill:  1  . carvedilol (COREG) 12.5 MG tablet    Sig: Take 1 tablet (12.5 mg total) by mouth 2 (two) times daily with a meal.    Dispense:  180 tablet    Refill:  1    Medications Discontinued During This Encounter  Medication Reason  . Tiotropium Bromide Monohydrate (SPIRIVA RESPIMAT) 2.5 MCG/ACT AERS Duplicate  . sildenafil (VIAGRA) 100 MG tablet   . LORazepam (ATIVAN) 0.5 MG tablet Reorder  . ALPRAZolam (XANAX) 0.5 MG tablet   . carvedilol (COREG) 12.5 MG tablet Reorder    Follow-up: Return for fasting labs next week  ,  6 weeks with Ronnett Pullin .   Crecencio Mc, MD

## 2015-05-21 NOTE — Progress Notes (Signed)
Pre-visit discussion using our clinic review tool. No additional management support is needed unless otherwise documented below in the visit note.  

## 2015-05-21 NOTE — Patient Instructions (Addendum)
I am changing your alprazolam to lorazepam to help your insomnia because it will last longer during the night  (hopefully Until morning) .   Start the lorazepam at  0.5 mg  Dose (full tablet),  You can increase the dose to 1 mg if necessary after a few days.    I think you would benefit from generic  zoloft to help treat your depression. Please start the zoloft (sertraline)  at 1/2 tablet daily in the evening after dinner  for the first few days to avoid nausea.  You can increase to a full tablet after 4 -6 days if you have  not developed any side effects like nausea.  You can increase the zoloft dose to 100 mg after 2 weeks of the 50 mg dose (just take 2)  if you still feel depressed.   Please return  To see me in  6 weeks   You are due next week for fasting labs   Referral for diabetic eye exam is in process  We will try to get you Cialis instead of Viagra using your BPH questionnaire score

## 2015-05-23 DIAGNOSIS — F4312 Post-traumatic stress disorder, chronic: Secondary | ICD-10-CM | POA: Insufficient documentation

## 2015-05-23 NOTE — Assessment & Plan Note (Signed)
He was changed to high potency lipitor several weeks ago and will return to have fasting labs next week.

## 2015-05-23 NOTE — Assessment & Plan Note (Signed)
Change alprazolam to lorazepam for management on nocturnal issues including insomnia , and adding zoloft for management of anxeity/depression

## 2015-05-23 NOTE — Assessment & Plan Note (Signed)
He will return for fasting labs and A!c next week

## 2015-05-29 ENCOUNTER — Encounter: Payer: Self-pay | Admitting: Internal Medicine

## 2015-05-29 ENCOUNTER — Other Ambulatory Visit (INDEPENDENT_AMBULATORY_CARE_PROVIDER_SITE_OTHER): Payer: Commercial Managed Care - HMO

## 2015-05-29 DIAGNOSIS — E119 Type 2 diabetes mellitus without complications: Secondary | ICD-10-CM

## 2015-05-29 DIAGNOSIS — E785 Hyperlipidemia, unspecified: Secondary | ICD-10-CM

## 2015-05-29 DIAGNOSIS — E538 Deficiency of other specified B group vitamins: Secondary | ICD-10-CM

## 2015-05-29 LAB — LDL CHOLESTEROL, DIRECT: LDL DIRECT: 125 mg/dL

## 2015-05-29 LAB — COMPREHENSIVE METABOLIC PANEL
ALK PHOS: 55 U/L (ref 39–117)
ALT: 38 U/L (ref 0–53)
AST: 21 U/L (ref 0–37)
Albumin: 4.3 g/dL (ref 3.5–5.2)
BUN: 23 mg/dL (ref 6–23)
CALCIUM: 9.5 mg/dL (ref 8.4–10.5)
CHLORIDE: 101 meq/L (ref 96–112)
CO2: 30 meq/L (ref 19–32)
CREATININE: 0.95 mg/dL (ref 0.40–1.50)
GFR: 83.34 mL/min (ref >60.00)
Glucose, Bld: 129 mg/dL — ABNORMAL HIGH (ref 70–99)
POTASSIUM: 4.5 meq/L (ref 3.5–5.1)
SODIUM: 137 meq/L (ref 135–145)
Total Bilirubin: 0.9 mg/dL (ref 0.2–1.2)
Total Protein: 7.2 g/dL (ref 6.0–8.3)

## 2015-05-29 LAB — LIPID PANEL
CHOL/HDL RATIO: 6
Cholesterol: 165 mg/dL (ref 0–200)
HDL: 27.3 mg/dL — AB (ref >39.00)
LDL Cholesterol: 117 mg/dL — ABNORMAL HIGH (ref 0–99)
NONHDL: 137.39
TRIGLYCERIDES: 104 mg/dL (ref 0.0–149.0)
VLDL: 20.8 mg/dL (ref 0.0–40.0)

## 2015-05-29 LAB — MICROALBUMIN / CREATININE URINE RATIO
CREATININE, U: 402.1 mg/dL
MICROALB/CREAT RATIO: 6.9 mg/g (ref 0.0–30.0)
Microalb, Ur: 27.7 mg/dL — ABNORMAL HIGH (ref 0.0–1.9)

## 2015-05-29 LAB — HEMOGLOBIN A1C: Hgb A1c MFr Bld: 6.6 % — ABNORMAL HIGH (ref 4.6–6.5)

## 2015-05-29 LAB — VITAMIN B12: Vitamin B-12: 299 pg/mL (ref 211–911)

## 2015-06-11 ENCOUNTER — Other Ambulatory Visit: Payer: Self-pay | Admitting: Internal Medicine

## 2015-06-11 ENCOUNTER — Telehealth: Payer: Self-pay | Admitting: *Deleted

## 2015-06-11 DIAGNOSIS — I10 Essential (primary) hypertension: Secondary | ICD-10-CM

## 2015-06-11 MED ORDER — LISINOPRIL 10 MG PO TABS: 10.0000 mg | ORAL_TABLET | Freq: Every day | ORAL | Status: DC

## 2015-06-11 NOTE — Telephone Encounter (Signed)
Attempted to call the patient back, Looks like he is referring to the Lisinopril that is discussed in the result note from April 17th.  Please advise and send if appropriate as it is a new med. For him.  thanks

## 2015-06-11 NOTE — Telephone Encounter (Signed)
The patient has been called to inform him that his medication has been sent to [place] and to keep his appointment for Friday lab work.

## 2015-06-11 NOTE — Telephone Encounter (Signed)
Patient has request to have a returned call in  reference to him having a kidney medication called in to pharmacy, he did not have a name for the medication, and stated that he never received the medication. Please advise  [phone]

## 2015-06-11 NOTE — Telephone Encounter (Signed)
Lisinopril 10 mg sent to [facility].  Start today.   return Friday for labs

## 2015-06-15 ENCOUNTER — Other Ambulatory Visit (INDEPENDENT_AMBULATORY_CARE_PROVIDER_SITE_OTHER): Payer: Commercial Managed Care - HMO

## 2015-06-15 DIAGNOSIS — I10 Essential (primary) hypertension: Secondary | ICD-10-CM

## 2015-06-15 LAB — BASIC METABOLIC PANEL
BUN: 23 mg/dL (ref 6–23)
CALCIUM: 9.4 mg/dL (ref 8.4–10.5)
CO2: 27 mEq/L (ref 19–32)
Chloride: 100 mEq/L (ref 96–112)
Creatinine, Ser: 1 mg/dL (ref 0.40–1.50)
GFR: 78.54 mL/min (ref >60.00)
Glucose, Bld: 112 mg/dL — ABNORMAL HIGH (ref 70–99)
Potassium: 4.1 mEq/L (ref 3.5–5.1)
Sodium: 135 mEq/L (ref 135–145)

## 2015-06-16 ENCOUNTER — Encounter: Payer: Self-pay | Admitting: Internal Medicine

## 2015-07-04 ENCOUNTER — Encounter: Payer: Self-pay | Admitting: Internal Medicine

## 2015-07-04 ENCOUNTER — Ambulatory Visit (INDEPENDENT_AMBULATORY_CARE_PROVIDER_SITE_OTHER): Payer: Commercial Managed Care - HMO | Admitting: Internal Medicine

## 2015-07-04 ENCOUNTER — Telehealth: Payer: Self-pay | Admitting: *Deleted

## 2015-07-04 VITALS — BP 118/66 | HR 73 | Temp 98.1°F | Resp 12 | Ht 71.0 in | Wt 198.5 lb

## 2015-07-04 DIAGNOSIS — R809 Proteinuria, unspecified: Secondary | ICD-10-CM

## 2015-07-04 DIAGNOSIS — E785 Hyperlipidemia, unspecified: Secondary | ICD-10-CM | POA: Diagnosis not present

## 2015-07-04 DIAGNOSIS — E119 Type 2 diabetes mellitus without complications: Secondary | ICD-10-CM | POA: Diagnosis not present

## 2015-07-04 DIAGNOSIS — R05 Cough: Secondary | ICD-10-CM | POA: Diagnosis not present

## 2015-07-04 DIAGNOSIS — R059 Cough, unspecified: Secondary | ICD-10-CM | POA: Insufficient documentation

## 2015-07-04 DIAGNOSIS — IMO0001 Reserved for inherently not codable concepts without codable children: Secondary | ICD-10-CM

## 2015-07-04 DIAGNOSIS — F4310 Post-traumatic stress disorder, unspecified: Secondary | ICD-10-CM

## 2015-07-04 MED ORDER — SERTRALINE HCL 100 MG PO TABS: 100.0000 mg | ORAL_TABLET | Freq: Every day | ORAL | Status: DC

## 2015-07-04 MED ORDER — LORAZEPAM 1 MG PO TABS: 1.0000 mg | ORAL_TABLET | Freq: Every day | ORAL | Status: DC

## 2015-07-04 NOTE — Assessment & Plan Note (Signed)
Increasing zoloft to 100 mg daily and lorazepam to 1 mg qhs.

## 2015-07-04 NOTE — Assessment & Plan Note (Signed)
Adding lisinopril.  Repeat BMET unchanged.

## 2015-07-04 NOTE — Assessment & Plan Note (Signed)
He was changed to high potency lipitor several weeks ago  Lab Results  Component Value Date   CHOL 165 05/29/2015   HDL 27.30* 05/29/2015   LDLCALC 117* 05/29/2015   LDLDIRECT 125.0 05/29/2015   TRIG 104.0 05/29/2015   CHOLHDL 6 05/29/2015

## 2015-07-04 NOTE — Telephone Encounter (Signed)
Informed Juliann Pulse.

## 2015-07-04 NOTE — Assessment & Plan Note (Signed)
Present for several months. Presumed to be due to GERD.. Will increase prilosec to 40 mg daiy

## 2015-07-04 NOTE — Progress Notes (Signed)
Pre-visit discussion using our clinic review tool. No additional management support is needed unless otherwise documented below in the visit note.  

## 2015-07-04 NOTE — Telephone Encounter (Signed)
FYI [facility], stated that Juliann Pulse called to cancel the Rx for the Zoloft, however, patient has picked up the Rx.

## 2015-07-04 NOTE — Progress Notes (Signed)
Subjective:  Patient ID: Ricky Abbott, male    DOB: 02-03-1946  Age: 70 y.o. 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 Diagnoses of PTSD (post-traumatic stress disorder), HLD (hyperlipidemia), and Cough were also pertinent to this visit. and is now runnnign   HPI Ricky Abbott presents for follow up on depression and microalbuminuria.  He was seen a month and endorsed multiple signs and symptoms of depression .  Was prescribed zoloft 50 mg daily.  Tolerating medication without side effects but does not feel any relief yet to symptoms of sadness, isolation, and lethargy.  wants to increase the dose to 100 mg ,  Grieving loss of of his brother  in law . He is having troul sleeping despite trial of lorazepam 0.5 mg but has had success with 1 mg .  He is not using it during the day   Has a chronic cough that predates lisinopril iniitiation , occurs late at night Takes prilosec 20 mg daily.    Eating  more fish worried about its effects on cholesterol.     Outpatient Prescriptions Prior to Visit  Medication Sig Dispense Refill  . amLODipine (NORVASC) 10 MG tablet Take 1 tablet (10 mg total) by mouth daily. 90 tablet 1  . aspirin 81 MG tablet Take 81 mg by mouth daily. Reported on 02/19/2015    . atorvastatin (LIPITOR) 80 MG tablet Take 1 tablet (80 mg total) by mouth daily. 90 tablet 4  . carvedilol (COREG) 12.5 MG tablet Take 1 tablet (12.5 mg total) by mouth 2 (two) times daily with a meal. 180 tablet 1  . clopidogrel (PLAVIX) 75 MG tablet Take 1 tablet (75 mg total) by mouth daily. 90 tablet 1  . lisinopril (PRINIVIL,ZESTRIL) 10 MG tablet Take 1 tablet (10 mg total) by mouth daily. 30 tablet 0  . nitroGLYCERIN (NITROSTAT) 0.4 MG SL tablet Place 1 tablet (0.4 mg total) under the tongue every 5 (five) minutes as needed for chest pain. 25 tablet 1  . omeprazole (PRILOSEC) 20 MG capsule Take 1 capsule (20 mg  total) by mouth daily. 90 capsule 1  . LORazepam (ATIVAN) 0.5 MG tablet Take 1 tablet (0.5 mg total) by mouth at bedtime. 90 tablet 1  . sertraline (ZOLOFT) 50 MG tablet Take 1 tablet (50 mg total) by mouth daily. 90 tablet 1  . Tiotropium Bromide Monohydrate (SPIRIVA RESPIMAT) 2.5 MCG/ACT AERS Inhale 2.5 mcg into the lungs daily. (Patient not taking: Reported on 05/21/2015) 3 Inhaler 3   No facility-administered medications prior to visit.    Review of Systems;  Patient denies headache, fevers, malaise, unintentional weight loss, skin rash, eye pain, sinus congestion and sinus pain, sore throat, dysphagia,  hemoptysis , cough, dyspnea, wheezing, chest pain, palpitations, orthopnea, edema, abdominal pain, nausea, melena, diarrhea, constipation, flank pain, dysuria, hematuria, urinary  Frequency, nocturia, numbness, tingling, seizures,  Focal weakness, Loss of consciousness,  Tremor, insomnia, depression, anxiety, and suicidal ideation.      Objective:  BP 118/66 mmHg  Pulse 73  Temp(Src) 98.1 F (36.7 C) (Oral)  Resp 12  Ht 5\' 11"  (1.803 m)  Wt 198 lb 8 oz (90.039 kg)  BMI 27.70 kg/m2  SpO2 97%  BP Readings from Last 3 Encounters:  07/04/15 118/66  05/21/15 130/78  04/04/15 110/64    Wt Readings from Last 3 Encounters:  07/04/15 198 lb 8 oz (90.039 kg)  05/21/15 201 lb 4 oz (91.286  kg)  04/04/15 198 lb 4 oz (89.926 kg)    General appearance: alert, cooperative and appears stated age Ears: normal TM's and external ear canals both ears Throat: lips, mucosa, and tongue normal; teeth and gums normal Neck: no adenopathy, no carotid bruit, supple, symmetrical, trachea midline and thyroid not enlarged, symmetric, no tenderness/mass/nodules Back: symmetric, no curvature. ROM normal. No CVA tenderness. Lungs: clear to auscultation bilaterally Heart: regular rate and rhythm, S1, S2 normal, no murmur, click, rub or gallop Abdomen: soft, non-tender; bowel sounds normal; no masses,  no  organomegaly Pulses: 2+ and symmetric Skin: Skin color, texture, turgor normal. No rashes or lesions Lymph nodes: Cervical, supraclavicular, and axillary nodes normal.  Lab Results  Component Value Date   HGBA1C 6.6* 05/29/2015    Lab Results  Component Value Date   CREATININE 1.00 06/15/2015   CREATININE 0.95 05/29/2015   CREATININE 0.90 02/27/2015    Lab Results  Component Value Date   WBC 7.9 08/15/2013   HGB 14.9 08/15/2013   HCT 44.1 08/15/2013   PLT 311.0 08/15/2013   GLUCOSE 112* 06/15/2015   CHOL 165 05/29/2015   TRIG 104.0 05/29/2015   HDL 27.30* 05/29/2015   LDLDIRECT 125.0 05/29/2015   LDLCALC 117* 05/29/2015   ALT 38 05/29/2015   AST 21 05/29/2015   NA 135 06/15/2015   K 4.1 06/15/2015   CL 100 06/15/2015   CREATININE 1.00 06/15/2015   BUN 23 06/15/2015   CO2 27 06/15/2015   TSH 0.95 02/19/2015   PSA 1.82 02/19/2015   HGBA1C 6.6* 05/29/2015   MICROALBUR 27.7* 05/29/2015    No results found.  Assessment & Plan:   Problem List Items Addressed This Visit    HLD (hyperlipidemia)    He was changed to high potency lipitor several weeks ago  Lab Results  Component Value Date   CHOL 165 05/29/2015   HDL 27.30* 05/29/2015   LDLCALC 117* 05/29/2015   LDLDIRECT 125.0 05/29/2015   TRIG 104.0 05/29/2015   CHOLHDL 6 05/29/2015         PTSD (post-traumatic stress disorder)    Increasing zoloft to 100 mg daily and lorazepam to 1 mg qhs.       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  Repeat BMET unchanged.       Cough    Present for several months. Presumed to be due to GERD.. Will increase prilosec to 40 mg daiy         I have changed Mr. Ricky Abbott's LORazepam. I am also having him maintain his aspirin, Tiotropium Bromide Monohydrate, nitroGLYCERIN, omeprazole, clopidogrel, amLODipine, atorvastatin, carvedilol, lisinopril, and sertraline.  Meds ordered this encounter  Medications  .  DISCONTD: sertraline (ZOLOFT) 100 MG tablet    Sig: Take 1 tablet (100 mg total) by mouth daily.    Dispense:  90 tablet    Refill:  1  . LORazepam (ATIVAN) 1 MG tablet    Sig: Take 1 tablet (1 mg total) by mouth at bedtime.    Dispense:  30 tablet    Refill:  5  . sertraline (ZOLOFT) 100 MG tablet    Sig: Take 1 tablet (100 mg total) by mouth daily.    Dispense:  90 tablet    Refill:  1    Medications Discontinued During This Encounter  Medication Reason  . sertraline (ZOLOFT) 50 MG tablet Reorder  . LORazepam (ATIVAN) 0.5 MG tablet Reorder  . sertraline (ZOLOFT) 100  MG tablet Reorder    Follow-up: Return for follow up diabetes, labs after July 18th  .   Crecencio Mc, MD

## 2015-07-04 NOTE — Patient Instructions (Signed)
Your kidney function is fine.  The lisinopril is being prescribed to protect your kidneys from declining   You can raise your HDL be eating more of the following  Nuts (almond, pecans,  Walnuts , pistachios,) avocadoes Olive oil Fish Lamb A glass of wine nightly with dinner Exercise  Artichokes Sun dried tomatoes   Think Mediterranean llifesytle  Don't skip breakfast  You might want to try a premixed protein drink called Premier Protein shake in the morning.  It is less $$$ and very low sugar.  160 cal  30 g protein  1 g sugar 50% calcium needs   [employer] now makes a frozen breakfast frittata that can be microwaved in 2 minutes and is very low carb. Frittats are similar to quiches without the crust

## 2015-07-05 ENCOUNTER — Telehealth: Payer: Self-pay | Admitting: Internal Medicine

## 2015-07-05 NOTE — Telephone Encounter (Signed)
Patient wanted clarification on fritatta serving size 2 muffins.

## 2015-07-05 NOTE — Telephone Encounter (Signed)
Pt called back regarding a question that was brought up at your visit yesterday. Pt is requesting for Juliann Pulse to call him back. I did advise that I have to give it to the nurse first.   Pt was not happy with the process of sending msg to the nurse.  Call pt @ [phone]. Thank you!

## 2015-07-05 NOTE — Telephone Encounter (Signed)
Attempted to speak with patient but he refused and only wants to speak with Juliann Pulse.

## 2015-07-10 ENCOUNTER — Telehealth: Payer: Self-pay | Admitting: *Deleted

## 2015-07-10 NOTE — Telephone Encounter (Signed)
Patient has requested to have his lisinopril 90 day supply.  Pharmacy [facility]

## 2015-07-11 ENCOUNTER — Other Ambulatory Visit: Payer: Self-pay | Admitting: Internal Medicine

## 2015-07-11 MED ORDER — LISINOPRIL 10 MG PO TABS: 10.0000 mg | ORAL_TABLET | Freq: Every day | ORAL | Status: DC

## 2015-07-11 NOTE — Telephone Encounter (Signed)
Refilled

## 2015-07-13 ENCOUNTER — Other Ambulatory Visit: Payer: Self-pay | Admitting: Internal Medicine

## 2015-07-16 NOTE — Telephone Encounter (Signed)
Verify patient wants local pharmacy to fill. Left message for patient to call office.

## 2015-07-19 ENCOUNTER — Telehealth: Payer: Self-pay | Admitting: *Deleted

## 2015-07-19 ENCOUNTER — Other Ambulatory Visit: Payer: Self-pay | Admitting: Internal Medicine

## 2015-07-19 DIAGNOSIS — R059 Cough, unspecified: Secondary | ICD-10-CM

## 2015-07-19 DIAGNOSIS — R05 Cough: Secondary | ICD-10-CM

## 2015-07-19 MED ORDER — OMEPRAZOLE 20 MG PO CPDR: 20.0000 mg | DELAYED_RELEASE_CAPSULE | Freq: Every day | ORAL | Status: DC

## 2015-07-19 MED ORDER — CLOPIDOGREL BISULFATE 75 MG PO TABS: 75.0000 mg | ORAL_TABLET | Freq: Every day | ORAL | Status: DC

## 2015-07-19 MED ORDER — AMLODIPINE BESYLATE 10 MG PO TABS: 10.0000 mg | ORAL_TABLET | Freq: Every day | ORAL | Status: DC

## 2015-07-19 NOTE — Telephone Encounter (Signed)
Patient has requested a 90 day supply for amlodipine, clopidogrel and omeprazole .  Pharmacy [facility] pharmacy

## 2015-07-19 NOTE — Telephone Encounter (Signed)
Refills sent

## 2015-07-25 NOTE — Telephone Encounter (Signed)
Mailed unread message to patient, thanks 

## 2015-08-03 LAB — HM DIABETES EYE EXAM

## 2015-08-06 DIAGNOSIS — E119 Type 2 diabetes mellitus without complications: Secondary | ICD-10-CM | POA: Diagnosis not present

## 2015-08-23 ENCOUNTER — Other Ambulatory Visit (INDEPENDENT_AMBULATORY_CARE_PROVIDER_SITE_OTHER): Payer: Commercial Managed Care - HMO

## 2015-08-23 ENCOUNTER — Ambulatory Visit: Payer: Self-pay | Admitting: Internal Medicine

## 2015-08-23 ENCOUNTER — Telehealth: Payer: Self-pay | Admitting: *Deleted

## 2015-08-23 DIAGNOSIS — E119 Type 2 diabetes mellitus without complications: Secondary | ICD-10-CM

## 2015-08-23 DIAGNOSIS — E785 Hyperlipidemia, unspecified: Secondary | ICD-10-CM | POA: Diagnosis not present

## 2015-08-23 LAB — LIPID PANEL
Cholesterol: 159 mg/dL (ref 0–200)
HDL: 28.3 mg/dL — ABNORMAL LOW
LDL Cholesterol: 93 mg/dL (ref 0–99)
NonHDL: 131.19
Total CHOL/HDL Ratio: 6
Triglycerides: 192 mg/dL — ABNORMAL HIGH (ref 0.0–149.0)
VLDL: 38.4 mg/dL (ref 0.0–40.0)

## 2015-08-23 LAB — COMPREHENSIVE METABOLIC PANEL WITH GFR
ALT: 57 U/L — ABNORMAL HIGH (ref 0–53)
AST: 27 U/L (ref 0–37)
Albumin: 4.2 g/dL (ref 3.5–5.2)
Alkaline Phosphatase: 81 U/L (ref 39–117)
BUN: 19 mg/dL (ref 6–23)
CO2: 32 meq/L (ref 19–32)
Calcium: 9.8 mg/dL (ref 8.4–10.5)
Chloride: 101 meq/L (ref 96–112)
Creatinine, Ser: 1.07 mg/dL (ref 0.40–1.50)
GFR: 72.6 mL/min
Glucose, Bld: 115 mg/dL — ABNORMAL HIGH (ref 70–99)
Potassium: 5.1 meq/L (ref 3.5–5.1)
Sodium: 139 meq/L (ref 135–145)
Total Bilirubin: 0.6 mg/dL (ref 0.2–1.2)
Total Protein: 7.2 g/dL (ref 6.0–8.3)

## 2015-08-23 LAB — LDL CHOLESTEROL, DIRECT: LDL DIRECT: 112 mg/dL

## 2015-08-23 NOTE — Telephone Encounter (Signed)
Lab Results  Component Value Date   HGBA1C 6.6* 05/29/2015   Lab Results  Component Value Date   MICROALBUR 27.7* 05/29/2015

## 2015-08-23 NOTE — Telephone Encounter (Signed)
Labs and dx?  

## 2015-08-29 ENCOUNTER — Other Ambulatory Visit: Payer: Self-pay

## 2015-09-18 ENCOUNTER — Encounter: Payer: Self-pay | Admitting: Internal Medicine

## 2015-09-27 ENCOUNTER — Encounter: Payer: Self-pay | Admitting: Internal Medicine

## 2015-09-27 ENCOUNTER — Ambulatory Visit (INDEPENDENT_AMBULATORY_CARE_PROVIDER_SITE_OTHER): Payer: Commercial Managed Care - HMO | Admitting: Internal Medicine

## 2015-09-27 VITALS — BP 112/54 | HR 71 | Temp 97.8°F | Resp 12 | Ht 73.0 in | Wt 190.5 lb

## 2015-09-27 DIAGNOSIS — F4312 Post-traumatic stress disorder, chronic: Secondary | ICD-10-CM

## 2015-09-27 DIAGNOSIS — IMO0001 Reserved for inherently not codable concepts without codable children: Secondary | ICD-10-CM

## 2015-09-27 DIAGNOSIS — A09 Infectious gastroenteritis and colitis, unspecified: Secondary | ICD-10-CM | POA: Diagnosis not present

## 2015-09-27 DIAGNOSIS — E785 Hyperlipidemia, unspecified: Secondary | ICD-10-CM

## 2015-09-27 DIAGNOSIS — R7401 Elevation of levels of liver transaminase levels: Secondary | ICD-10-CM

## 2015-09-27 DIAGNOSIS — I2510 Atherosclerotic heart disease of native coronary artery without angina pectoris: Secondary | ICD-10-CM

## 2015-09-27 DIAGNOSIS — I10 Essential (primary) hypertension: Secondary | ICD-10-CM

## 2015-09-27 DIAGNOSIS — I951 Orthostatic hypotension: Secondary | ICD-10-CM | POA: Diagnosis not present

## 2015-09-27 DIAGNOSIS — D126 Benign neoplasm of colon, unspecified: Secondary | ICD-10-CM

## 2015-09-27 DIAGNOSIS — R809 Proteinuria, unspecified: Secondary | ICD-10-CM

## 2015-09-27 DIAGNOSIS — R740 Nonspecific elevation of levels of transaminase and lactic acid dehydrogenase [LDH]: Secondary | ICD-10-CM

## 2015-09-27 DIAGNOSIS — E119 Type 2 diabetes mellitus without complications: Secondary | ICD-10-CM

## 2015-09-27 LAB — MICROALBUMIN / CREATININE URINE RATIO
MICROALB UR: 19.5 mg/dL — AB (ref 0.0–1.9)
Microalb Creat Ratio: 3.7 mg/g (ref 0.0–30.0)

## 2015-09-27 LAB — COMPREHENSIVE METABOLIC PANEL
ALBUMIN: 4.5 g/dL (ref 3.5–5.2)
ALK PHOS: 73 U/L (ref 39–117)
ALT: 96 U/L — AB (ref 0–53)
AST: 49 U/L — AB (ref 0–37)
BILIRUBIN TOTAL: 0.6 mg/dL (ref 0.2–1.2)
BUN: 22 mg/dL (ref 6–23)
CALCIUM: 10.2 mg/dL (ref 8.4–10.5)
CO2: 29 meq/L (ref 19–32)
CREATININE: 1.31 mg/dL (ref 0.40–1.50)
Chloride: 103 mEq/L (ref 96–112)
GFR: 57.47 mL/min — AB (ref >60.00)
Glucose, Bld: 114 mg/dL — ABNORMAL HIGH (ref 70–99)
Potassium: 5.5 mEq/L — ABNORMAL HIGH (ref 3.5–5.1)
Sodium: 138 mEq/L (ref 135–145)
Total Protein: 7.6 g/dL (ref 6.0–8.3)

## 2015-09-27 LAB — CBC WITH DIFFERENTIAL/PLATELET
BASOS ABS: 0 10*3/uL (ref 0.0–0.1)
BASOS PCT: 0.3 % (ref 0.0–3.0)
EOS ABS: 0.9 10*3/uL — AB (ref 0.0–0.7)
Eosinophils Relative: 9.2 % — ABNORMAL HIGH (ref 0.0–5.0)
HEMATOCRIT: 42.7 % (ref 39.0–52.0)
Hemoglobin: 14.3 g/dL (ref 13.0–17.0)
LYMPHS ABS: 1.8 10*3/uL (ref 0.7–4.0)
LYMPHS PCT: 18.3 % (ref 12.0–46.0)
MCHC: 33.6 g/dL (ref 30.0–36.0)
MCV: 82.7 fl (ref 78.0–100.0)
MONO ABS: 1.1 10*3/uL — AB (ref 0.1–1.0)
Monocytes Relative: 11 % (ref 3.0–12.0)
NEUTROS ABS: 6.1 10*3/uL (ref 1.4–7.7)
NEUTROS PCT: 61.2 % (ref 43.0–77.0)
PLATELETS: 224 10*3/uL (ref 150.0–400.0)
RBC: 5.17 Mil/uL (ref 4.22–5.81)
RDW: 16 % — AB (ref 11.5–15.5)
WBC: 10 10*3/uL (ref 4.0–10.5)

## 2015-09-27 LAB — SEDIMENTATION RATE: SED RATE: 10 mm/h (ref 0–20)

## 2015-09-27 LAB — HEMOGLOBIN A1C: HEMOGLOBIN A1C: 6.8 % — AB (ref 4.6–6.5)

## 2015-09-27 LAB — MAGNESIUM: MAGNESIUM: 1.9 mg/dL (ref 1.5–2.5)

## 2015-09-27 NOTE — Progress Notes (Signed)
Subjective:  Patient ID: Ricky Abbott, male    DOB: 1945-05-17  Age: 70 y.o. MRN: [id number]  CC: There were no encounter diagnoses.  HPI Ricky Abbott presents for follow up hypertension and diabetes  He is recovering from a 24 period of watery stools that occurs without warning approximately every other month. The episodes last about 24 hours and have been occurring for the last 6 months.  Last nights' was the worst,  Was profusely watery and was accompanied by scant amounts of blood . The diarrheal episodes occurred every 10 minutes  throughout the night and were not accompanied by cramping, nausea or fevers.  Some fecal urgengy resulting in incontinence .None today thus far.  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  Has no friend or family    Still skipping breakfast a lot,  Avoids lunch too. Eats fruit, canned and fresh, along with  A lean cuisine frozen dinner at night.    Has been having recurrent episodes of feeling weak and pre syncope if he walks outside in the heat. Occurs after less than 10 minutes in the direct sunlight and in the high humidity Has been trying to get shopping in  in early morning.  NOtices that urine is either yellow or pale ,  Not tea coloroed.     ,Orthostatic  By VS today with drop in systolic from [id number] to 98 and increase in pulse from 71 to 89  Accompanied by symptoms of feeling weak and shaky  Has lost 9 lbs since last visit    Sleeping much better and feeling much calmer since starting zoloft and lorazepam . Getting 8 hours of sleep 2 am to 10 am .  No more anger outbursts   Outpatient Medications Prior to Visit  Medication Sig Dispense Refill  . amLODipine (NORVASC) 10 MG tablet Take 1 tablet (10 mg total) by mouth daily. 90 tablet 1  . aspirin 81 MG tablet Take 81 mg by mouth daily. Reported on 02/19/2015    . atorvastatin (LIPITOR) 80 MG tablet Take 1  tablet (80 mg total) by mouth daily. 90 tablet 4  . carvedilol (COREG) 12.5 MG tablet Take 1 tablet (12.5 mg total) by mouth 2 (two) times daily with a meal. 180 tablet 1  . clopidogrel (PLAVIX) 75 MG tablet Take 1 tablet (75 mg total) by mouth daily. 90 tablet 1  . lisinopril (PRINIVIL,ZESTRIL) 10 MG tablet Take 1 tablet (10 mg total) by mouth daily. 90 tablet 3  . LORazepam (ATIVAN) 1 MG tablet Take 1 tablet (1 mg total) by mouth at bedtime. 30 tablet 5  . nitroGLYCERIN (NITROSTAT) 0.4 MG SL tablet Place 1 tablet (0.4 mg total) under the tongue every 5 (five) minutes as needed for chest pain. 25 tablet 1  . omeprazole (PRILOSEC) 20 MG capsule Take 1 capsule (20 mg total) by mouth daily. 90 capsule 1  . sertraline (ZOLOFT) 100 MG tablet Take 1 tablet (100 mg total) by mouth daily. 90 tablet 1  . Tiotropium Bromide Monohydrate (SPIRIVA RESPIMAT) 2.5 MCG/ACT AERS Inhale 2.5 mcg into the lungs daily. 3 Inhaler 3  . lisinopril (PRINIVIL,ZESTRIL) 10 MG tablet TAKE ONE TABLET BY MOUTH EVERY DAY 30 tablet 0   No facility-administered medications prior to visit.     Review of Systems;  Patient denies headache, fevers, malaise, unintentional weight loss, skin rash, eye pain,  sinus congestion and sinus pain, sore throat, dysphagia,  hemoptysis , cough, dyspnea, wheezing, chest pain, palpitations, orthopnea, edema, abdominal pain, nausea, melena, diarrhea, constipation, flank pain, dysuria, hematuria, urinary  Frequency, nocturia, numbness, tingling, seizures,  Focal weakness, Loss of consciousness,  Tremor, insomnia, depression, anxiety, and suicidal ideation.      Objective:  BP 110/60   Pulse 73   Temp 97.8 F (36.6 C) (Oral)   Resp 12   Ht 6\' 1"  (1.854 m)   Wt 190 lb 8 oz (86.4 kg)   SpO2 97%   BMI 25.13 kg/m   BP Readings from Last 3 Encounters:  09/27/15 110/60  07/04/15 118/66  05/21/15 130/78    Wt Readings from Last 3 Encounters:  09/27/15 190 lb 8 oz (86.4 kg)  07/04/15 198 lb  8 oz (90 kg)  05/21/15 201 lb 4 oz (91.3 kg)    General appearance: alert, cooperative and appears stated age Ears: normal TM's and external ear canals both ears Throat: lips, mucosa, and tongue normal; teeth and gums normal Neck: no adenopathy, no carotid bruit, supple, symmetrical, trachea midline and thyroid not enlarged, symmetric, no tenderness/mass/nodules Back: symmetric, no curvature. ROM normal. No CVA tenderness. Lungs: clear to auscultation bilaterally Heart: regular rate and rhythm, S1, S2 normal, no murmur, click, rub or gallop Abdomen: soft, non-tender; bowel sounds normal; no masses,  no organomegaly Pulses: 2+ and symmetric Skin: Skin color, texture, turgor normal. No rashes or lesions Lymph nodes: Cervical, supraclavicular, and axillary nodes normal.  Lab Results  Component Value Date   HGBA1C 6.6 (H) 05/29/2015    Lab Results  Component Value Date   CREATININE 1.07 08/23/2015   CREATININE 1.00 06/15/2015   CREATININE 0.95 05/29/2015    Lab Results  Component Value Date   WBC 7.9 08/15/2013   HGB 14.9 08/15/2013   HCT 44.1 08/15/2013   PLT 311.0 08/15/2013   GLUCOSE 115 (H) 08/23/2015   CHOL 159 08/23/2015   TRIG 192.0 (H) 08/23/2015   HDL 28.30 (L) 08/23/2015   LDLDIRECT 112.0 08/23/2015   LDLCALC 93 08/23/2015   ALT 57 (H) 08/23/2015   AST 27 08/23/2015   NA 139 08/23/2015   K 5.1 08/23/2015   CL 101 08/23/2015   CREATININE 1.07 08/23/2015   BUN 19 08/23/2015   CO2 32 08/23/2015   TSH 0.95 02/19/2015   PSA 1.82 02/19/2015   HGBA1C 6.6 (H) 05/29/2015   MICROALBUR 27.7 (H) 05/29/2015    No results found.  Assessment & Plan:   Problem List Items Addressed This Visit    None    Visit Diagnoses   None.     I am having Mr. Fairclough maintain his aspirin, Tiotropium Bromide Monohydrate, nitroGLYCERIN, atorvastatin, carvedilol, LORazepam, sertraline, lisinopril, amLODipine, clopidogrel, and omeprazole.  No orders of the defined types were  placed in this encounter.   Medications Discontinued During This Encounter  Medication Reason  . lisinopril (PRINIVIL,ZESTRIL) 10 MG tablet Duplicate    Follow-up: No Follow-up on file.   Crecencio Mc, MD

## 2015-09-27 NOTE — Progress Notes (Signed)
Cologuard ordered

## 2015-09-27 NOTE — Patient Instructions (Addendum)
You are dehydrated today from your diarrhea last night. That's why you are dizzy, because you blood pressure is dropping.    Do not take your blood pressure medications (amlodipine and lisinopril)  until your home blood pressure readings are  > 140/80   Today  You should Eat Saltines , chicken  soup and gatorade (NOT G2, this will upset your bowels more ) .  Do this Whenever you get dehydated from diarrhea. .  Don't worry about the salt for those few days.   It is unlikely that the malaria is still hanging around  I am going to try to get you set up for a colonoscopy because you are due     Please take a probiotic ( Align, Floraque or Culturelle), the generic version of one of these over the counter medications, or a PROBIOTIC BEVERAGE  (kombucha,  [place] ) once you are feelign better ( no darrhea ) you can start  EATING Yogurt, or another dietary source) for a minimum of 3 weeks to prevent a serious antibiotic associated diarrhea  Called clostridium dificile colitis.  Taking a probiotic  can be continued indefinitely if you feel that it improves your digestion or your elimination (bowels).

## 2015-09-27 NOTE — Progress Notes (Signed)
Pre-visit discussion using our clinic review tool. No additional management support is needed unless otherwise documented below in the visit note.  

## 2015-09-28 LAB — LIPID PANEL W/DIRECT LDL/HDL RATIO
CHOL/HDL RATIO: 5.8 ratio — AB (ref <=5.0)
Cholesterol: 167 mg/dL (ref 125–200)
HDL: 29 mg/dL — AB (ref >=40)
LDL DIRECT: 101 mg/dL (ref <130)
LDL/HDL RATIO (DIRECT LDL): 3.5 ratio
Triglycerides: 261 mg/dL — ABNORMAL HIGH (ref <150)

## 2015-09-30 DIAGNOSIS — E119 Type 2 diabetes mellitus without complications: Secondary | ICD-10-CM | POA: Insufficient documentation

## 2015-09-30 DIAGNOSIS — R7401 Elevation of levels of liver transaminase levels: Secondary | ICD-10-CM | POA: Insufficient documentation

## 2015-09-30 DIAGNOSIS — A09 Infectious gastroenteritis and colitis, unspecified: Secondary | ICD-10-CM | POA: Insufficient documentation

## 2015-09-30 DIAGNOSIS — I951 Orthostatic hypotension: Secondary | ICD-10-CM | POA: Insufficient documentation

## 2015-09-30 DIAGNOSIS — R740 Nonspecific elevation of levels of transaminase and lactic acid dehydrogenase [LDH]: Secondary | ICD-10-CM

## 2015-09-30 NOTE — Assessment & Plan Note (Signed)
Anger outbursts , anxiety and insomnia are greatly improved with change in therapy to  lorazepam for management on nocturnal issues including insomnia , and  zoloft for management of anxeity/depression

## 2015-09-30 NOTE — Assessment & Plan Note (Addendum)
Secondary to dehydration. Lisinopril has been suspended Potassium is mildly elevated and will need repeating  Next week.   Lab Results  Component Value Date   CREATININE 1.31 09/27/2015   Lab Results  Component Value Date   NA 138 09/27/2015   K 5.5 (H) 09/27/2015   CL 103 09/27/2015   CO2 29 09/27/2015   Lab Results  Component Value Date   WBC 10.0 09/27/2015   HGB 14.3 09/27/2015   HCT 42.7 09/27/2015   MCV 82.7 09/27/2015   PLT 224.0 09/27/2015

## 2015-09-30 NOTE — Assessment & Plan Note (Signed)
Found on 2011 colonoscopy.  Repeat colonoscopy due

## 2015-09-30 NOTE — Assessment & Plan Note (Signed)
No changes, except to suspend coreg and lisinopril currently due to orthostasis from dehydration

## 2015-09-30 NOTE — Assessment & Plan Note (Signed)
Was referred to cardiology for follow up  Continue asa, plavix , lipitor , carvedilol and lisinopril (BP meds suspended while orthostatic)

## 2015-09-30 NOTE — Assessment & Plan Note (Addendum)
New onset,  With history of treated Hepatitis C and immunization against Hep B  .  Will repeat enzymes next week and viral load.

## 2015-09-30 NOTE — Assessment & Plan Note (Signed)
Self limiting, Occurring every 60 days,  Question whether it is a small bowel overgrowth?  His DM is controlled so less likely.  Needs to have colonoscopy for follow up on tubular adenoma but lack of family or social support would make it impossible to do as an outpatient. Trial of probiotic.Marland Kitchen

## 2015-09-30 NOTE — Assessment & Plan Note (Signed)
Currently well-controlled on current medications .  hemoglobin A1c is at goal of less than 7.0 . Patient is reminded to schedule an annual eye exam and foot exam is normal today. Patient has microalbuminuria and is tolerating lisinopril. . Patient is tolerating statin therapy for CAD risk reduction.  Lab Results  Component Value Date   HGBA1C 6.8 (H) 09/27/2015   Lab Results  Component Value Date   MICROALBUR 19.5 (H) 09/27/2015   Lab Results  Component Value Date   CHOL 159 08/23/2015   HDL 29 (L) 09/27/2015   LDLCALC 93 08/23/2015   LDLDIRECT 101 09/27/2015   TRIG 261 (H) 09/27/2015   CHOLHDL 5.8 (H) 09/27/2015

## 2015-10-04 ENCOUNTER — Encounter: Payer: Self-pay | Admitting: *Deleted

## 2015-10-04 ENCOUNTER — Ambulatory Visit (INDEPENDENT_AMBULATORY_CARE_PROVIDER_SITE_OTHER): Payer: Commercial Managed Care - HMO | Admitting: *Deleted

## 2015-10-04 ENCOUNTER — Other Ambulatory Visit (INDEPENDENT_AMBULATORY_CARE_PROVIDER_SITE_OTHER): Payer: Commercial Managed Care - HMO

## 2015-10-04 VITALS — BP 114/64 | HR 68

## 2015-10-04 DIAGNOSIS — R7401 Elevation of levels of liver transaminase levels: Secondary | ICD-10-CM

## 2015-10-04 DIAGNOSIS — R740 Nonspecific elevation of levels of transaminase and lactic acid dehydrogenase [LDH]: Secondary | ICD-10-CM

## 2015-10-04 DIAGNOSIS — I951 Orthostatic hypotension: Secondary | ICD-10-CM | POA: Diagnosis not present

## 2015-10-04 DIAGNOSIS — B1920 Unspecified viral hepatitis C without hepatic coma: Secondary | ICD-10-CM | POA: Diagnosis not present

## 2015-10-04 LAB — COMPREHENSIVE METABOLIC PANEL
ALBUMIN: 4.4 g/dL (ref 3.5–5.2)
ALK PHOS: 86 U/L (ref 39–117)
ALT: 59 U/L — AB (ref 0–53)
AST: 35 U/L (ref 0–37)
BUN: 26 mg/dL — ABNORMAL HIGH (ref 6–23)
CALCIUM: 9.5 mg/dL (ref 8.4–10.5)
CO2: 29 mEq/L (ref 19–32)
Chloride: 104 mEq/L (ref 96–112)
Creatinine, Ser: 1.03 mg/dL (ref 0.40–1.50)
GFR: 75.84 mL/min (ref >60.00)
Glucose, Bld: 122 mg/dL — ABNORMAL HIGH (ref 70–99)
POTASSIUM: 4.6 meq/L (ref 3.5–5.1)
Sodium: 140 mEq/L (ref 135–145)
TOTAL PROTEIN: 7.5 g/dL (ref 6.0–8.3)
Total Bilirubin: 0.5 mg/dL (ref 0.2–1.2)

## 2015-10-04 NOTE — Progress Notes (Signed)
Patient presented for repeat BP check and Labs. Patient BP in right arm 114/64 P 68 patient blood pressure in left arm 118/70 P68

## 2015-10-05 LAB — HCV RNA QUANT: Hepatitis C Quantitation: NOT DETECTED IU/mL

## 2015-10-05 NOTE — Progress Notes (Signed)
BP is well controlled Continue current medications 

## 2015-10-05 NOTE — Progress Notes (Signed)
  I have reviewed the above information and agree with above.   Alyrica Thurow, MD 

## 2015-10-09 NOTE — Progress Notes (Signed)
Patient notified

## 2015-10-10 ENCOUNTER — Other Ambulatory Visit: Payer: Self-pay | Admitting: Internal Medicine

## 2015-10-24 ENCOUNTER — Other Ambulatory Visit: Payer: Self-pay | Admitting: Internal Medicine

## 2015-10-25 DIAGNOSIS — Z1211 Encounter for screening for malignant neoplasm of colon: Secondary | ICD-10-CM | POA: Diagnosis not present

## 2015-10-25 DIAGNOSIS — Z1212 Encounter for screening for malignant neoplasm of rectum: Secondary | ICD-10-CM | POA: Diagnosis not present

## 2015-10-25 LAB — COLOGUARD: COLOGUARD: NEGATIVE

## 2015-11-05 ENCOUNTER — Telehealth: Payer: Self-pay | Admitting: Internal Medicine

## 2015-11-05 ENCOUNTER — Encounter: Payer: Self-pay | Admitting: *Deleted

## 2015-11-05 NOTE — Telephone Encounter (Signed)
The results of patient's cologuard is negative.   We will repeat every 3 years For colon CA screening. Please abstract and notify patient

## 2015-12-13 ENCOUNTER — Ambulatory Visit (INDEPENDENT_AMBULATORY_CARE_PROVIDER_SITE_OTHER): Payer: Commercial Managed Care - HMO

## 2015-12-13 DIAGNOSIS — Z23 Encounter for immunization: Secondary | ICD-10-CM | POA: Diagnosis not present

## 2016-01-14 ENCOUNTER — Telehealth: Payer: Self-pay | Admitting: Internal Medicine

## 2016-01-14 DIAGNOSIS — R059 Cough, unspecified: Secondary | ICD-10-CM

## 2016-01-14 DIAGNOSIS — R05 Cough: Secondary | ICD-10-CM

## 2016-01-14 MED ORDER — CLOPIDOGREL BISULFATE 75 MG PO TABS: 75.0000 mg | ORAL_TABLET | Freq: Every day | ORAL | 1 refills | Status: DC

## 2016-01-14 MED ORDER — LORAZEPAM 1 MG PO TABS: 1.0000 mg | ORAL_TABLET | Freq: Every day | ORAL | 0 refills | Status: DC

## 2016-01-14 MED ORDER — OMEPRAZOLE 20 MG PO CPDR: 20.0000 mg | DELAYED_RELEASE_CAPSULE | Freq: Every day | ORAL | 1 refills | Status: DC

## 2016-01-14 NOTE — Telephone Encounter (Signed)
Pt called about needing a refill for clopidogrel (PLAVIX) 75 MG tablet, omeprazole (PRILOSEC) 20 MG capsule and LORazepam (ATIVAN) 1 MG tablet.  Pharmacy is [facility], [place]  Call pt @ [phone].thank you!

## 2016-01-14 NOTE — Telephone Encounter (Signed)
Last OV 8/17 filled Plavix as requested but ok to fill Ativan placed local due to control substance policy.

## 2016-01-14 NOTE — Telephone Encounter (Signed)
Refilled.  Including ativan.  Needs  appt 30 days

## 2016-01-15 NOTE — Telephone Encounter (Signed)
Patient has an appointment on 02/01/16 and would like to pick up script at that time for the Ativan.

## 2016-03-03 ENCOUNTER — Ambulatory Visit: Payer: Self-pay | Admitting: Internal Medicine

## 2016-03-21 ENCOUNTER — Ambulatory Visit (INDEPENDENT_AMBULATORY_CARE_PROVIDER_SITE_OTHER): Payer: Commercial Managed Care - HMO

## 2016-03-21 VITALS — BP 160/82 | HR 73 | Temp 98.1°F | Resp 14 | Ht 70.0 in | Wt 201.0 lb

## 2016-03-21 DIAGNOSIS — Z23 Encounter for immunization: Secondary | ICD-10-CM | POA: Diagnosis not present

## 2016-03-21 DIAGNOSIS — Z0000 Encounter for general adult medical examination without abnormal findings: Secondary | ICD-10-CM

## 2016-03-21 NOTE — Progress Notes (Signed)
Subjective:   Ricky Abbott is a 71 y.o. male who presents for an Initial Medicare Annual Wellness Visit.  Review of Systems  No ROS.  Medicare Wellness Visit.  Cardiac Risk Factors include: advanced age (>31men, >72 women);male gender;hypertension;diabetes mellitus    Objective:    Today's Vitals   03/21/16 0809  BP: (!) 160/82  Pulse: 73  Resp: 14  Temp: 98.1 F (36.7 C)  TempSrc: Oral  SpO2: 98%  Weight: 201 lb (91.2 kg)  Height: 5\' 10"  (1.778 m)   Body mass index is 28.84 kg/m.  Current Medications (verified) Outpatient Encounter Prescriptions as of 03/21/2016  Medication Sig  . amLODipine (NORVASC) 10 MG tablet Take 1 tablet (10 mg total) by mouth daily.  Marland Kitchen aspirin 81 MG tablet Take 81 mg by mouth daily. Reported on 02/19/2015  . atorvastatin (LIPITOR) 80 MG tablet Take 1 tablet (80 mg total) by mouth daily.  . carvedilol (COREG) 12.5 MG tablet TAKE 1 TABLET TWICE DAILY WITH MEALS  . clopidogrel (PLAVIX) 75 MG tablet Take 1 tablet (75 mg total) by mouth daily.  Marland Kitchen lisinopril (PRINIVIL,ZESTRIL) 10 MG tablet Take 1 tablet (10 mg total) by mouth daily.  Marland Kitchen LORazepam (ATIVAN) 1 MG tablet Take 1 tablet (1 mg total) by mouth at bedtime.  . nitroGLYCERIN (NITROSTAT) 0.4 MG SL tablet Place 1 tablet (0.4 mg total) under the tongue every 5 (five) minutes as needed for chest pain.  Marland Kitchen omeprazole (PRILOSEC) 20 MG capsule Take 1 capsule (20 mg total) by mouth daily.  . sertraline (ZOLOFT) 100 MG tablet Take 1 tablet (100 mg total) by mouth daily.  . Tiotropium Bromide Monohydrate (SPIRIVA RESPIMAT) 2.5 MCG/ACT AERS Inhale 2.5 mcg into the lungs daily.   No facility-administered encounter medications on file as of 03/21/2016.     Allergies (verified) Patient has no known allergies.   History: Past Medical History:  Diagnosis Date  . Allergy   . Asthma   . CAD, multiple vessel   . Diabetes mellitus without complication ([place])   . History of chicken pox   . Hyperlipidemia     . Hypertension   . MI (myocardial infarction)   . PTSD (post-traumatic stress disorder)    [place] [facility]  . Sarcoidosis of lung [facility])    Past Surgical History:  Procedure Laterality Date  . [place], [place]  . CARDIAC CATHETERIZATION     [facility]   . [place], 05/2012   x 2   Family History  Problem Relation Age of Onset  . Cancer Mother   . Hyperlipidemia Mother   . Hypertension Mother   . Cancer Father     stomach  . Hyperlipidemia Father   . Hypertension Father   . Cancer Brother     bone cancer   Social History   Occupational History  . Not on file.   Social History Main Topics  . Smoking status: Former Smoker    Packs/day: 4.00    Years: 25.00    Types: Cigarettes    Quit date: 03/24/1981  . Smokeless tobacco: Never Used     Comment: quit 30 years asgo  . Alcohol use No  . Drug use: No  . Sexual activity: No   Tobacco Counseling Counseling given: Not Answered   Activities of Daily Living In your present state of health, do you have any difficulty performing the following activities: 03/21/2016  Hearing? Y  Vision? N  Difficulty concentrating or making decisions? N  Walking or climbing stairs? Y  Dressing or bathing? N  Doing errands, shopping? N  Preparing Food and eating ? N  Using the Toilet? N  In the past six months, have you accidently leaked urine? N  Do you have problems with loss of bowel control? N  Managing your Medications? N  Managing your Finances? N  Housekeeping or managing your Housekeeping? N  Some recent data might be hidden    Immunizations and Health Maintenance Immunization History  Administered Date(s) Administered  . Influenza Split 11/14/2013  . Influenza, High Dose Seasonal PF 12/13/2015  . Pneumococcal Conjugate-13 03/21/2016  . Pneumococcal Polysaccharide-23 01/26/2014  . Td 08/15/2013   Health Maintenance Due  Topic Date Due  . ZOSTAVAX  07/25/2005     Patient Care Team: Crecencio Mc, MD as PCP - General (Internal Medicine) Minna Merritts, MD as Consulting Physician (Cardiology)  Indicate any recent Medical Services you may have received from other than [facility] providers in the past year (date may be approximate).    Assessment:   This is a routine wellness examination for [name]. The goal of the wellness visit is to assist the patient how to close the gaps in care and create a preventative care plan for the patient.   Osteoporosis risk reviewed.  Medications reviewed; taking without issues or barriers.  Safety issues reviewed; lives alone.  Smoke detectors in the home. No firearms in the home. Wears seatbelts when driving or riding with others. No violence in the home.  No identified risk were noted; The patient was oriented x 3; appropriate in dress and manner and no objective failures at ADL's or IADL's.   BMI; discussed the importance of a healthy diet, water intake and exercise. Educational material provided.  HTN; BP taken twice, same reading 160/82.  Followed by PCP.  Appointment scheduled.    Prevnar 13 vaccine administered, L deltoid.  Tolerated well.  ZOSTAVAX vaccine deferred per patient preference.  Educational material provided.  Patient Concerns: None at this time. Follow up with PCP as needed.  Hearing/Vision screen Hearing Screening Comments: Difficulty hearing a whisper Audiologic testing deferred per patient preference within the last year  Vision Screening Comments: Followed by [facility] No retinopathy reported Last OV 11/2015 Visual acuity not assessed per patient preference since he has regular follow up with his ophthalmologist  Dietary issues and exercise activities discussed: Current Exercise Habits: The patient does not participate in regular exercise at present  Goals    . Increase water intake      Depression Screen PHQ 2/9 Scores 03/21/2016 05/23/2015 08/15/2013  PHQ -  2 Score 1 3 0  PHQ- 9 Score - 7 -    Fall Risk Fall Risk  03/21/2016 05/23/2015 08/15/2013  Falls in the past year? No No No    Cognitive Function:     6CIT Screen 03/21/2016  What Year? 0 points  What month? 0 points  What time? 0 points  Count back from 20 0 points  Months in reverse 0 points  Repeat phrase 0 points  Total Score 0    Screening Tests Health Maintenance  Topic Date Due  . ZOSTAVAX  07/25/2005  . HEMOGLOBIN A1C  03/29/2016  . FOOT EXAM  05/20/2016  . OPHTHALMOLOGY EXAM  08/02/2016  . COLONOSCOPY  09/04/2019  . TETANUS/TDAP  08/16/2023  . INFLUENZA VACCINE  Completed  . Hepatitis C Screening  Completed  . PNA vac Low  Risk Adult  Completed        Plan:   End of life planning; Advance aging; Advanced directives discussed. No HCPOA/Living Will.  Additional information declined at this time.  Medicare Attestation I have personally reviewed: The patient's medical and social history Their use of alcohol, tobacco or illicit drugs Their current medications and supplements The patient's functional ability including ADLs,fall risks, home safety risks, cognitive, and hearing and visual impairment Diet and physical activities Evidence for depression   The patient's weight, height, BMI, and visual acuity have been recorded in the chart.  I have made referrals and provided education to the patient based on review of the above and I have provided the patient with a written personalized care plan for preventive services.    During the course of the visit Ariana was educated and counseled about the following appropriate screening and preventive services:   Vaccines to include Pneumoccal, Influenza, Hepatitis B, Td, Zostavax, HCV  Electrocardiogram  Colorectal cancer screening  Cardiovascular disease screening  Diabetes screening  Glaucoma screening  Nutrition counseling  Prostate cancer screening  Smoking cessation counseling  Patient Instructions  (the written plan) were given to the patient.   Varney Biles, LPN   [id number]

## 2016-03-21 NOTE — Patient Instructions (Addendum)
  Ricky Abbott , Thank you for taking time to come for your Medicare Wellness Visit. I appreciate your ongoing commitment to your health goals. Please review the following plan we discussed and let me know if I can assist you in the future.   Follow up with Dr. Derrel Nip as needed.  DNR form  Verify medication list at home  These are the goals we discussed: Goals    . Increase water intake       This is a list of the screening recommended for you and due dates:  Health Maintenance  Topic Date Due  . Shingles Vaccine  07/25/2005  . Hemoglobin A1C  03/29/2016  . Complete foot exam   05/20/2016  . Eye exam for diabetics  08/02/2016  . Colon Cancer Screening  09/04/2019  . Tetanus Vaccine  08/16/2023  . Flu Shot  Completed  .  Hepatitis C: One time screening is recommended by Center for Disease Control  (CDC) for  adults born from [birth year] through 1965.   Completed  . Pneumonia vaccines  Completed

## 2016-03-23 NOTE — Progress Notes (Signed)
  I have reviewed the above information and agree with above.   Teresa Tullo, MD 

## 2016-04-07 ENCOUNTER — Ambulatory Visit (INDEPENDENT_AMBULATORY_CARE_PROVIDER_SITE_OTHER): Payer: Commercial Managed Care - HMO

## 2016-04-07 ENCOUNTER — Encounter: Payer: Self-pay | Admitting: Internal Medicine

## 2016-04-07 ENCOUNTER — Ambulatory Visit (INDEPENDENT_AMBULATORY_CARE_PROVIDER_SITE_OTHER): Payer: Commercial Managed Care - HMO | Admitting: Internal Medicine

## 2016-04-07 VITALS — BP 170/90 | HR 62 | Resp 16 | Wt 202.0 lb

## 2016-04-07 DIAGNOSIS — R42 Dizziness and giddiness: Secondary | ICD-10-CM

## 2016-04-07 DIAGNOSIS — E119 Type 2 diabetes mellitus without complications: Secondary | ICD-10-CM | POA: Diagnosis not present

## 2016-04-07 DIAGNOSIS — E7800 Pure hypercholesterolemia, unspecified: Secondary | ICD-10-CM

## 2016-04-07 DIAGNOSIS — I2510 Atherosclerotic heart disease of native coronary artery without angina pectoris: Secondary | ICD-10-CM

## 2016-04-07 DIAGNOSIS — M47812 Spondylosis without myelopathy or radiculopathy, cervical region: Secondary | ICD-10-CM | POA: Diagnosis not present

## 2016-04-07 DIAGNOSIS — R3912 Poor urinary stream: Secondary | ICD-10-CM | POA: Diagnosis not present

## 2016-04-07 DIAGNOSIS — M542 Cervicalgia: Secondary | ICD-10-CM

## 2016-04-07 DIAGNOSIS — R809 Proteinuria, unspecified: Secondary | ICD-10-CM

## 2016-04-07 DIAGNOSIS — R0789 Other chest pain: Secondary | ICD-10-CM

## 2016-04-07 DIAGNOSIS — E1129 Type 2 diabetes mellitus with other diabetic kidney complication: Secondary | ICD-10-CM | POA: Diagnosis not present

## 2016-04-07 DIAGNOSIS — I10 Essential (primary) hypertension: Secondary | ICD-10-CM | POA: Diagnosis not present

## 2016-04-07 DIAGNOSIS — F4310 Post-traumatic stress disorder, unspecified: Secondary | ICD-10-CM

## 2016-04-07 LAB — POCT URINALYSIS DIPSTICK
Bilirubin, UA: NEGATIVE
Glucose, UA: NEGATIVE
Ketones, UA: NEGATIVE
LEUKOCYTES UA: NEGATIVE
Nitrite, UA: NEGATIVE
PH UA: 6
Protein, UA: NEGATIVE
RBC UA: NEGATIVE
SPEC GRAV UA: 1.015
UROBILINOGEN UA: 0.2

## 2016-04-07 LAB — POCT GLYCOSYLATED HEMOGLOBIN (HGB A1C): Hemoglobin A1C: 7.1

## 2016-04-07 MED ORDER — LOSARTAN POTASSIUM 50 MG PO TABS: 50.0000 mg | ORAL_TABLET | Freq: Every day | ORAL | 3 refills | Status: DC

## 2016-04-07 NOTE — Progress Notes (Signed)
Pre visit review using our clinic review tool, if applicable. No additional management support is needed unless otherwise documented below in the visit note. 

## 2016-04-07 NOTE — Assessment & Plan Note (Addendum)
extremely high today, taking only amlodipine since august with no recent follow up on hypertension ,  Resuming  losartan 50 mg daily  And low dose carvbedilol.  Rtc one week for rn to check bp

## 2016-04-07 NOTE — Progress Notes (Signed)
Subjective:  Patient ID: Ricky Abbott, male    DOB: 1945-04-20  Age: 71 y.o. MRN: [id number]  CC: The primary encounter diagnosis was Other chest pain. Diagnoses of Controlled type 2 diabetes mellitus with microalbuminuria, without long-term current use of insulin (HCC), Weak urine stream, Pure hypercholesterolemia, Type 2 diabetes, HbA1c goal < 7% (HCC), Neck pain, bilateral posterior, Essential hypertension, CAD, multiple vessel, PTSD (post-traumatic stress disorder), and Dizziness were also pertinent to this visit.  HPI [name] presents for follow up on diet controlled  type 2 DM,  hypertension and urinary hesitancy with weak stream.  Improved with suspension of G2   Getting recurrent episodes of dizziness and "feel like I'm falling out."  has not fainted. No hypotensive episodes  since coreg and lisinopril were stopped last august .    Also having chest pain, occurring at rest and with activity,  Acc by shortness of breath and presyncope.  epsisodess are unrelated to the dizzy spells.  Has taken NTG for the past 3 nights  Around 7 pm every night .   resolves with 1 NTG tablet.  Needs appt with Gollan but has been unable to schedule for himself.   Had eye exam with porfilio in  June,  Early cataracts noted,  No retinopathy   Lab Results  Component Value Date   HGBA1C 7.1 04/07/2016    Last meal was yogurt 3 hours ago.  Has been eating low carb greek yogurt 2- servings daily  Treated for recurrent diarrhea in august with probiotic  Hypertension,. Last check was august,  Normal.   elevated for AWV in early February bp 200/100 today by my check.   meds reviewed :   Amlodipine 10 mg Coreg 12.5 bid suspended in august  Lisinopril 10 mg daily suspended in august    Lab Results  Component Value Date   HGBA1C 7.1 04/07/2016          Outpatient Medications Prior to Visit  Medication Sig Dispense Refill  . amLODipine (NORVASC) 10 MG tablet Take 1 tablet (10 mg  total) by mouth daily. 90 tablet 1  . aspirin 81 MG tablet Take 81 mg by mouth daily. Reported on 02/19/2015    . atorvastatin (LIPITOR) 80 MG tablet Take 1 tablet (80 mg total) by mouth daily. 90 tablet 4  . clopidogrel (PLAVIX) 75 MG tablet Take 1 tablet (75 mg total) by mouth daily. 90 tablet 1  . lisinopril (PRINIVIL,ZESTRIL) 10 MG tablet Take 1 tablet (10 mg total) by mouth daily. 90 tablet 3  . LORazepam (ATIVAN) 1 MG tablet Take 1 tablet (1 mg total) by mouth at bedtime. 90 tablet 0  . nitroGLYCERIN (NITROSTAT) 0.4 MG SL tablet Place 1 tablet (0.4 mg total) under the tongue every 5 (five) minutes as needed for chest pain. 25 tablet 1  . omeprazole (PRILOSEC) 20 MG capsule Take 1 capsule (20 mg total) by mouth daily. 90 capsule 1  . sertraline (ZOLOFT) 100 MG tablet Take 1 tablet (100 mg total) by mouth daily. 90 tablet 1  . Tiotropium Bromide Monohydrate (SPIRIVA RESPIMAT) 2.5 MCG/ACT AERS Inhale 2.5 mcg into the lungs daily. 3 Inhaler 3  . carvedilol (COREG) 12.5 MG tablet TAKE 1 TABLET TWICE DAILY WITH MEALS 180 tablet 1   No facility-administered medications prior to visit.     Review of Systems;  Patient denies headache, fevers, malaise, unintentional weight loss, skin rash, eye pain, sinus congestion and sinus pain, sore throat, dysphagia,  hemoptysis ,  cough,wheezing, , palpitations, orthopnea, edema, abdominal pain, nausea, melena, diarrhea, constipation, flank pain, dysuria, hematuria, urinary  Frequency, nocturia, numbness, tingling, seizures,  Focal weakness, Loss of consciousness,  Tremor, insomnia, depression, anxiety, and suicidal ideation.      Objective:  BP (!) 170/90   Pulse 62   Resp 16   Wt 202 lb (91.6 kg)   SpO2 97%   BMI 28.98 kg/m   BP Readings from Last 3 Encounters:  04/07/16 (!) 170/90  03/21/16 (!) 160/82  10/04/15 114/64    Wt Readings from Last 3 Encounters:  04/07/16 202 lb (91.6 kg)  03/21/16 201 lb (91.2 kg)  09/27/15 190 lb 8 oz (86.4 kg)      General appearance: alert, cooperative and appears stated age Ears: normal TM's and external ear canals both ears Throat: lips, mucosa, and tongue normal; teeth and gums normal Neck: no adenopathy, no carotid bruit, supple, symmetrical, trachea midline and thyroid not enlarged, symmetric, no tenderness/mass/nodules Back: symmetric, no curvature. ROM normal. No CVA tenderness. Lungs: clear to auscultation bilaterally Heart: regular rate and rhythm, S1, S2 normal, no murmur, click, rub or gallop Abdomen: soft, non-tender; bowel sounds normal; no masses,  no organomegaly Pulses: 2+ and symmetric Skin: Skin color, texture, turgor normal. No rashes or lesions Lymph nodes: Cervical, supraclavicular, and axillary nodes normal.  Lab Results  Component Value Date   HGBA1C 7.1 04/07/2016   HGBA1C 6.8 (H) 09/27/2015   HGBA1C 6.6 (H) 05/29/2015    Lab Results  Component Value Date   CREATININE 1.03 10/04/2015   CREATININE 1.31 09/27/2015   CREATININE 1.07 08/23/2015    Lab Results  Component Value Date   WBC 10.0 09/27/2015   HGB 14.3 09/27/2015   HCT 42.7 09/27/2015   PLT 224.0 09/27/2015   GLUCOSE 122 (H) 10/04/2015   CHOL 159 08/23/2015   TRIG 261 (H) 09/27/2015   HDL 29 (L) 09/27/2015   LDLDIRECT 101 09/27/2015   LDLCALC 93 08/23/2015   ALT 59 (H) 10/04/2015   AST 35 10/04/2015   NA 140 10/04/2015   K 4.6 10/04/2015   CL 104 10/04/2015   CREATININE 1.03 10/04/2015   BUN 26 (H) 10/04/2015   CO2 29 10/04/2015   TSH 0.95 02/19/2015   PSA 1.82 02/19/2015   HGBA1C 7.1 04/07/2016   MICROALBUR 19.5 (H) 09/27/2015    No results found.  Assessment & Plan:   Problem List Items Addressed This Visit    CAD, multiple vessel    He has been having effort angina lately,  relieved  by sl nitroglycerin x 1.  Referral to dr Rockey Situ in process       Relevant Medications   losartan (COZAAR) 50 MG tablet   carvedilol (COREG) 12.5 MG tablet   Essential hypertension    extremely  high today, taking only amlodipine since august with no recent follow up on hypertension ,  Resuming  losartan 50 mg daily  And low dose carvbedilol.  Rtc one week for rn to check bp       Relevant Medications   losartan (COZAAR) 50 MG tablet   carvedilol (COREG) 12.5 MG tablet   Other Relevant Orders   Comprehensive metabolic panel   Direct LDL   Lipid panel   Hyperlipidemia    Managed with high potency statin for secondary cad prevention.f he will return for fasting labs next week with rn visit for bp check.    Lab Results  Component Value Date   CHOL 159 08/23/2015  HDL 29 (L) 09/27/2015   LDLCALC 93 08/23/2015   LDLDIRECT 101 09/27/2015   TRIG 261 (H) 09/27/2015   CHOLHDL 5.8 (H) 09/27/2015         Relevant Medications   losartan (COZAAR) 50 MG tablet   carvedilol (COREG) 12.5 MG tablet   Other chest pain - Primary   Relevant Orders   Ambulatory referral to Cardiology   PTSD (post-traumatic stress disorder)    coninue  zoloft  100 mg daily and lorazepam  1 mg qhs. He appears more calm since moving out of the boarding house and living alone.       Type 2 diabetes, HbA1c goal < 7% (HCC)    Currently well-controlled on current medications . foot exam is normal today. Patient has microalbuminuria and is not taking lisinopril due to orthostasis noted at last visit in August. Resuming losartan 50 mg daily today.   Patient is tolerating statin therapy for secondary  CAD prevention   Lab Results  Component Value Date   HGBA1C 7.1 04/07/2016   Lab Results  Component Value Date   MICROALBUR 19.5 (H) 09/27/2015   Lab Results  Component Value Date   CHOL 159 08/23/2015   HDL 29 (L) 09/27/2015   LDLCALC 93 08/23/2015   LDLDIRECT 101 09/27/2015   TRIG 261 (H) 09/27/2015   CHOLHDL 5.8 (H) 09/27/2015         Relevant Medications   losartan (COZAAR) 50 MG tablet    Other Visit Diagnoses    Controlled type 2 diabetes mellitus with microalbuminuria, without long-term  current use of insulin (HCC)       Relevant Medications   losartan (COZAAR) 50 MG tablet   Other Relevant Orders   POCT glycosylated hemoglobin (Hb A1C) (Completed)   Weak urine stream       Relevant Orders   POCT urinalysis dipstick (Completed)   Microalbumin / creatinine urine ratio   Neck pain, bilateral posterior       Relevant Orders   DG Cervical Spine Complete (Completed)   Dizziness       Relevant Orders   US Carotid Duplex Bilateral      I have changed Mr. Simerly's carvedilol. I am also having him start on losartan. Additionally, I am having him maintain his aspirin, Tiotropium Bromide Monohydrate, nitroGLYCERIN, atorvastatin, sertraline, lisinopril, amLODipine, clopidogrel, LORazepam, and omeprazole.  A total of 40 minutes was spent with patient more than half of which was spent in counseling patient on the above mentioned issues , reviewing and explaining recent labs and imaging studies done, and coordination of care.  Meds ordered this encounter  Medications  . losartan (COZAAR) 50 MG tablet    Sig: Take 1 tablet (50 mg total) by mouth daily.    Dispense:  90 tablet    Refill:  3  . carvedilol (COREG) 12.5 MG tablet    Sig: Take 0.5 tablets (6.25 mg total) by mouth 2 (two) times daily with a meal.    Dispense:  180 tablet    Refill:  1    Medications Discontinued During This Encounter  Medication Reason  . carvedilol (COREG) 12.5 MG tablet Reorder    Follow-up: Return in about 1 week (around 04/14/2016), or rn visit for bp check,  3 months with tullo .   Crecencio Mc, MD

## 2016-04-07 NOTE — Patient Instructions (Addendum)
Your blood pressure is EXTREMELY HIGH TODAY  I am  starting you on losartan 50 mg daily .  Continue the amlodipine .  After one week return for bp check.    Referral to dr Rockey Situ in progress  I also want his office to check your carotid arteries.    Neck x ray today   Drink less of your water after 7 pm  So it doesn't keep you up all night   You are doing very well  On the diabetes and yu do not need medications at this time.   Avoid entrees that have 15 g of sugar or higher  There is a  Low carb stuffed flounder (frozen entree) that is microwaveable and available at [facility] can try a teason of magnesium at night for your leg cramps

## 2016-04-08 ENCOUNTER — Telehealth: Payer: Self-pay | Admitting: Internal Medicine

## 2016-04-08 LAB — MICROALBUMIN / CREATININE URINE RATIO
Creatinine,U: 31.6 mg/dL
MICROALB UR: 1.3 mg/dL (ref 0.0–1.9)
MICROALB/CREAT RATIO: 4.1 mg/g (ref 0.0–30.0)

## 2016-04-08 MED ORDER — CARVEDILOL 12.5 MG PO TABS: 6.2500 mg | ORAL_TABLET | Freq: Two times a day (BID) | ORAL | 1 refills | Status: DC

## 2016-04-08 NOTE — Telephone Encounter (Signed)
AT YESTERDAY'S VISIT I ASKED HIM TO RESUME LOSARTAN 50 MG DAILY,  I ALSO WANT HIM TO RESUme CARVEDILOL AT 1/2 HIS PREV IOUS DOSE, twice daily  (6.25 mg ) . IF HE NEEDS REFILLS,  Ok to send rx for cqrvedilol and losartan 50 .  He also needs to return one week for bp check and to fast for appt so he can have the labs done that I ordered. No food for at least 3 hours prior to visit

## 2016-04-08 NOTE — Assessment & Plan Note (Addendum)
He has been having effort angina lately,  relieved  by sl nitroglycerin x 1.  Referral to dr Rockey Situ in process

## 2016-04-08 NOTE — Assessment & Plan Note (Signed)
Managed with high potency statin for secondary cad prevention.f he will return for fasting labs next week with rn visit for bp check.    Lab Results  Component Value Date   CHOL 159 08/23/2015   HDL 29 (L) 09/27/2015   LDLCALC 93 08/23/2015   LDLDIRECT 101 09/27/2015   TRIG 261 (H) 09/27/2015   CHOLHDL 5.8 (H) 09/27/2015

## 2016-04-08 NOTE — Assessment & Plan Note (Signed)
Currently well-controlled on current medications . foot exam is normal today. Patient has microalbuminuria and is not taking lisinopril due to orthostasis noted at last visit in August. Resuming losartan 50 mg daily today.   Patient is tolerating statin therapy for secondary  CAD prevention   Lab Results  Component Value Date   HGBA1C 7.1 04/07/2016   Lab Results  Component Value Date   MICROALBUR 19.5 (H) 09/27/2015   Lab Results  Component Value Date   CHOL 159 08/23/2015   HDL 29 (L) 09/27/2015   LDLCALC 93 08/23/2015   LDLDIRECT 101 09/27/2015   TRIG 261 (H) 09/27/2015   CHOLHDL 5.8 (H) 09/27/2015

## 2016-04-08 NOTE — Assessment & Plan Note (Signed)
coninue  zoloft  100 mg daily and lorazepam  1 mg qhs. He appears more calm since moving out of the boarding house and living alone.

## 2016-04-08 NOTE — Telephone Encounter (Signed)
Pt informed. Transferred to scheduler to make nurse visit appt and lab appt.

## 2016-04-11 ENCOUNTER — Other Ambulatory Visit: Payer: Self-pay | Admitting: Internal Medicine

## 2016-04-11 DIAGNOSIS — R42 Dizziness and giddiness: Secondary | ICD-10-CM

## 2016-04-11 DIAGNOSIS — M5090 Cervical disc disorder, unspecified, unspecified cervical region: Secondary | ICD-10-CM

## 2016-04-15 ENCOUNTER — Ambulatory Visit (INDEPENDENT_AMBULATORY_CARE_PROVIDER_SITE_OTHER): Payer: Medicare HMO | Admitting: Cardiovascular Disease

## 2016-04-15 ENCOUNTER — Other Ambulatory Visit: Payer: Medicare HMO

## 2016-04-15 ENCOUNTER — Telehealth: Payer: Self-pay | Admitting: *Deleted

## 2016-04-15 ENCOUNTER — Encounter: Payer: Self-pay | Admitting: Cardiovascular Disease

## 2016-04-15 ENCOUNTER — Ambulatory Visit (INDEPENDENT_AMBULATORY_CARE_PROVIDER_SITE_OTHER): Payer: Medicare HMO | Admitting: *Deleted

## 2016-04-15 VITALS — BP 160/94 | HR 68 | Resp 12

## 2016-04-15 VITALS — BP 148/78 | HR 70 | Ht 70.0 in | Wt 194.5 lb

## 2016-04-15 DIAGNOSIS — I739 Peripheral vascular disease, unspecified: Secondary | ICD-10-CM | POA: Diagnosis not present

## 2016-04-15 DIAGNOSIS — E639 Nutritional deficiency, unspecified: Secondary | ICD-10-CM | POA: Diagnosis not present

## 2016-04-15 DIAGNOSIS — F1011 Alcohol abuse, in remission: Secondary | ICD-10-CM | POA: Insufficient documentation

## 2016-04-15 DIAGNOSIS — F4310 Post-traumatic stress disorder, unspecified: Secondary | ICD-10-CM

## 2016-04-15 DIAGNOSIS — E119 Type 2 diabetes mellitus without complications: Secondary | ICD-10-CM | POA: Diagnosis not present

## 2016-04-15 DIAGNOSIS — I10 Essential (primary) hypertension: Secondary | ICD-10-CM

## 2016-04-15 DIAGNOSIS — E7800 Pure hypercholesterolemia, unspecified: Secondary | ICD-10-CM

## 2016-04-15 DIAGNOSIS — Z8739 Personal history of other diseases of the musculoskeletal system and connective tissue: Secondary | ICD-10-CM

## 2016-04-15 DIAGNOSIS — I2510 Atherosclerotic heart disease of native coronary artery without angina pectoris: Secondary | ICD-10-CM

## 2016-04-15 DIAGNOSIS — F1010 Alcohol abuse, uncomplicated: Secondary | ICD-10-CM | POA: Diagnosis not present

## 2016-04-15 NOTE — Telephone Encounter (Signed)
I tried to contact pt @ 10:45am (no answer or voicemail) to notify him that we will need to repeat his labwork. There was a problem with his lube (its cracked in the centrifuge). I tried to save it & my attempt failed. Will try to reach patient again this afternoon. Pt will need to return to office for fasting labs.

## 2016-04-15 NOTE — Progress Notes (Addendum)
Patient presented for BP check one week post visit with pressure of 170/90 at last OV. Patient was allowed to relax in sitting position with feet flat on the floor for 5 minutes and BP was attained in Left arm at heart level with normal cuff. BP 160/94 pulse 68, then patient was allowed to relax an additional 5-8 minutes and BP was attained in right arm 158/94 with pulse of 66. Patient also made nurse aware of he thinks he knows why his BP was elevated that he has been eating saltines and Ra mien noodles because that is the only food he has until he is receives Medicare check. Advised patient of local food banks in the county.  Reviewed above information.  Will forward to PCP for review.    Dr Nicki Reaper
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
## 2016-04-15 NOTE — Patient Instructions (Addendum)
Please call if you have additional chest pain We could run a stress test  Medication Instructions:   No medication changes made  Labwork:  No new labs needed  Testing/Procedures:  We will order ABI/LE arterial doppler for claudication  Your physician has requested that you have a lower extremity arterial exercise duplex. During this test, exercise and ultrasound are used to evaluate arterial blood flow in the legs. Allow one hour for this exam. There are no restrictions or special instructions.  Your physician has requested that you have an ankle brachial index (ABI). During this test an ultrasound and blood pressure cuff are used to evaluate the arteries that supply the arms and legs with blood. Allow thirty minutes for this exam. There are no restrictions or special instructions.  I recommend watching educational videos on topics of interest to you at:       [email or web address].com  Enter code: HEARTCARE    Follow-Up: It was a pleasure seeing you in the office today. Please call us if you have new issues that need to be addressed before your next appt.  [phone]  Your physician wants you to follow-up in: 6 months.  You will receive a reminder letter in the mail two months in advance. If you don't receive a letter, please call our office to schedule the follow-up appointment.  If you need a refill on your cardiac medications before your next appointment, please call your pharmacy.    Ankle-Brachial Index Test The ankle-brachial index (ABI) test is used to find peripheral vascular disease (PVD). PVD is also known as peripheral arterial disease (PAD). PVD is the blocking or hardening of the arteries anywhere within the circulatory system beyond the heart. PVD is caused by cholesterol deposits in your blood vessels (atherosclerosis). These deposits cause arteries to narrow. The delivery of oxygen to your tissues is impaired as a result. This can cause muscle pain and fatigue. This is  called claudication. PVD means there may also be buildup of cholesterol in your:  Heart. This increases the risk of heart attacks.  Brain. This increases the risk of strokes. The ankle-brachial index test measures the blood flow in your arms and legs. This test also determines if blood vessels in your leg are narrowed by cholesterol deposits. There are additional causes of a reduced ankle-brachial index, such as inflammation of vessels or a clot in the vessels. However, these are much less common than narrowing due to cholesterol deposits. What is being tested? The test is done while you are lying down and resting. Measurements are taken of the systolic pressure:  In your arm (brachial).  In your ankle at several points along your leg. Systolic pressure is the pressure inside your arteries when your heart pumps. The measurements are taken several times on both sides. Then, the highest systolic pressure of the ankle is divided by the highest brachial systolic pressure. The result is the ankle-brachial pressure ratio, or ABI. Sometimes this test is repeated after you have exercised on a treadmill for five minutes. You may have leg pain during the exercise portion of the test if you suffer from PAD. If the index number drops after exercise, this may show that PAD is present. A normal ABI ratio is between 0.9 and 1.4. A value below 0.9 is considered abnormal. This information is not intended to replace advice given to you by your health care provider. Make sure you discuss any questions you have with your health care provider. Document Released: 02/01/2004 Document Revised:  07/05/2015 Document Reviewed: 09/02/2013 Elsevier Interactive Patient Education  2017 [employer].

## 2016-04-15 NOTE — Progress Notes (Signed)
FYI.  Wanted you to see f/u on blood pressure.

## 2016-04-15 NOTE — Telephone Encounter (Signed)
I spoke with patient & rescheduled his lab appt for 04/30/16. Pt unable to come sooner due to financial reasons.

## 2016-04-15 NOTE — Addendum Note (Signed)
Addended by: Leeanne Rio on: 04/15/2016 12:03 PM   Modules accepted: Orders

## 2016-04-18 ENCOUNTER — Other Ambulatory Visit (INDEPENDENT_AMBULATORY_CARE_PROVIDER_SITE_OTHER): Payer: Medicare HMO

## 2016-04-18 DIAGNOSIS — I10 Essential (primary) hypertension: Secondary | ICD-10-CM

## 2016-04-18 LAB — LIPID PANEL
CHOLESTEROL: 171 mg/dL (ref 0–200)
HDL: 29.9 mg/dL — ABNORMAL LOW (ref >39.00)
LDL Cholesterol: 110 mg/dL — ABNORMAL HIGH (ref 0–99)
NonHDL: 141.32
TRIGLYCERIDES: 157 mg/dL — AB (ref 0.0–149.0)
Total CHOL/HDL Ratio: 6
VLDL: 31.4 mg/dL (ref 0.0–40.0)

## 2016-04-18 LAB — COMPREHENSIVE METABOLIC PANEL
ALBUMIN: 4.3 g/dL (ref 3.5–5.2)
ALK PHOS: 75 U/L (ref 39–117)
ALT: 18 U/L (ref 0–53)
AST: 16 U/L (ref 0–37)
BILIRUBIN TOTAL: 0.8 mg/dL (ref 0.2–1.2)
BUN: 13 mg/dL (ref 6–23)
CHLORIDE: 101 meq/L (ref 96–112)
CO2: 29 mEq/L (ref 19–32)
CREATININE: 0.95 mg/dL (ref 0.40–1.50)
Calcium: 9.9 mg/dL (ref 8.4–10.5)
GFR: 83.13 mL/min (ref >60.00)
Glucose, Bld: 122 mg/dL — ABNORMAL HIGH (ref 70–99)
Potassium: 4.3 mEq/L (ref 3.5–5.1)
SODIUM: 139 meq/L (ref 135–145)
TOTAL PROTEIN: 7.2 g/dL (ref 6.0–8.3)

## 2016-04-18 LAB — LDL CHOLESTEROL, DIRECT: Direct LDL: 117 mg/dL

## 2016-04-21 ENCOUNTER — Other Ambulatory Visit: Payer: Self-pay | Admitting: Internal Medicine

## 2016-04-21 ENCOUNTER — Telehealth: Payer: Self-pay | Admitting: *Deleted

## 2016-04-21 MED ORDER — ATORVASTATIN CALCIUM 80 MG PO TABS: 80.0000 mg | ORAL_TABLET | Freq: Every day | ORAL | 4 refills | Status: DC

## 2016-04-21 MED ORDER — ROSUVASTATIN CALCIUM 40 MG PO TABS: 40.0000 mg | ORAL_TABLET | Freq: Every day | ORAL | 5 refills | Status: DC

## 2016-04-21 NOTE — Telephone Encounter (Signed)
Patient requested lab results  Contact [phone]

## 2016-04-21 NOTE — Telephone Encounter (Signed)
Documented in the result note message.

## 2016-04-24 ENCOUNTER — Telehealth: Payer: Self-pay | Admitting: Internal Medicine

## 2016-04-24 ENCOUNTER — Other Ambulatory Visit: Payer: Self-pay | Admitting: Internal Medicine

## 2016-04-24 DIAGNOSIS — R05 Cough: Secondary | ICD-10-CM

## 2016-04-24 DIAGNOSIS — R059 Cough, unspecified: Secondary | ICD-10-CM

## 2016-04-24 MED ORDER — OMEPRAZOLE 20 MG PO CPDR: 20.0000 mg | DELAYED_RELEASE_CAPSULE | Freq: Every day | ORAL | 1 refills | Status: DC

## 2016-04-24 MED ORDER — SERTRALINE HCL 100 MG PO TABS: 100.0000 mg | ORAL_TABLET | Freq: Every day | ORAL | 1 refills | Status: DC

## 2016-04-24 MED ORDER — CLOPIDOGREL BISULFATE 75 MG PO TABS: 75.0000 mg | ORAL_TABLET | Freq: Every day | ORAL | 1 refills | Status: DC

## 2016-04-24 MED ORDER — CARVEDILOL 12.5 MG PO TABS: 6.2500 mg | ORAL_TABLET | Freq: Two times a day (BID) | ORAL | 1 refills | Status: DC

## 2016-04-24 NOTE — Telephone Encounter (Signed)
P called back and stated that he forgot that he is compeltely out of sertraline (ZOLOFT) 100 MG tablet. Please advise, thank you!  [place] [address] -  (N), [place]

## 2016-04-24 NOTE — Telephone Encounter (Signed)
Pt called and needs a new rx sent over for carvedilol (COREG) 12.5 MG tablet, clopidogrel (PLAVIX) 75 MG tablet, and omeprazole (PRILOSEC) 20 MG capsule. Please advise, thank you!  [place] (N), [place] - [place]  Call pt @ [phone]

## 2016-04-24 NOTE — Telephone Encounter (Signed)
Refilled per protocol. thanks

## 2016-04-24 NOTE — Telephone Encounter (Signed)
rx was sent to pharmacy and pt was notified.

## 2016-04-25 ENCOUNTER — Ambulatory Visit: Payer: Medicare HMO

## 2016-04-28 ENCOUNTER — Emergency Department: Payer: Medicare HMO

## 2016-04-28 ENCOUNTER — Encounter: Payer: Self-pay | Admitting: Emergency Medicine

## 2016-04-28 ENCOUNTER — Inpatient Hospital Stay
Admission: EM | Admit: 2016-04-28 | Discharge: 2016-05-02 | DRG: 446 | Disposition: A | Discharge status: Home / Self Care | Payer: Medicare HMO | Attending: Specialist | Admitting: Specialist

## 2016-04-28 ENCOUNTER — Inpatient Hospital Stay (HOSPITAL_COMMUNITY)
Admit: 2016-04-28 | Discharge: 2016-04-28 | Disposition: A | Discharge status: Home / Self Care | Payer: Medicare HMO | Attending: Physician Assistant | Admitting: Physician Assistant

## 2016-04-28 ENCOUNTER — Telehealth: Payer: Self-pay | Admitting: Radiology

## 2016-04-28 DIAGNOSIS — R1013 Epigastric pain: Secondary | ICD-10-CM | POA: Diagnosis not present

## 2016-04-28 DIAGNOSIS — Z8249 Family history of ischemic heart disease and other diseases of the circulatory system: Secondary | ICD-10-CM

## 2016-04-28 DIAGNOSIS — J449 Chronic obstructive pulmonary disease, unspecified: Secondary | ICD-10-CM | POA: Diagnosis present

## 2016-04-28 DIAGNOSIS — I10 Essential (primary) hypertension: Secondary | ICD-10-CM | POA: Diagnosis present

## 2016-04-28 DIAGNOSIS — K810 Acute cholecystitis: Secondary | ICD-10-CM | POA: Diagnosis not present

## 2016-04-28 DIAGNOSIS — F4310 Post-traumatic stress disorder, unspecified: Secondary | ICD-10-CM | POA: Diagnosis not present

## 2016-04-28 DIAGNOSIS — Z01810 Encounter for preprocedural cardiovascular examination: Secondary | ICD-10-CM

## 2016-04-28 DIAGNOSIS — Z7982 Long term (current) use of aspirin: Secondary | ICD-10-CM | POA: Diagnosis not present

## 2016-04-28 DIAGNOSIS — Z79899 Other long term (current) drug therapy: Secondary | ICD-10-CM | POA: Diagnosis not present

## 2016-04-28 DIAGNOSIS — I252 Old myocardial infarction: Secondary | ICD-10-CM | POA: Diagnosis not present

## 2016-04-28 DIAGNOSIS — B1920 Unspecified viral hepatitis C without hepatic coma: Secondary | ICD-10-CM | POA: Diagnosis not present

## 2016-04-28 DIAGNOSIS — D691 Qualitative platelet defects: Secondary | ICD-10-CM | POA: Diagnosis not present

## 2016-04-28 DIAGNOSIS — R109 Unspecified abdominal pain: Secondary | ICD-10-CM | POA: Diagnosis not present

## 2016-04-28 DIAGNOSIS — E119 Type 2 diabetes mellitus without complications: Secondary | ICD-10-CM | POA: Diagnosis not present

## 2016-04-28 DIAGNOSIS — Z955 Presence of coronary angioplasty implant and graft: Secondary | ICD-10-CM | POA: Diagnosis not present

## 2016-04-28 DIAGNOSIS — Z7902 Long term (current) use of antithrombotics/antiplatelets: Secondary | ICD-10-CM | POA: Diagnosis not present

## 2016-04-28 DIAGNOSIS — Z9114 Patient's other noncompliance with medication regimen: Secondary | ICD-10-CM

## 2016-04-28 DIAGNOSIS — K8000 Calculus of gallbladder with acute cholecystitis without obstruction: Secondary | ICD-10-CM | POA: Diagnosis not present

## 2016-04-28 DIAGNOSIS — Z87891 Personal history of nicotine dependence: Secondary | ICD-10-CM | POA: Diagnosis not present

## 2016-04-28 DIAGNOSIS — K8050 Calculus of bile duct without cholangitis or cholecystitis without obstruction: Secondary | ICD-10-CM

## 2016-04-28 DIAGNOSIS — K8020 Calculus of gallbladder without cholecystitis without obstruction: Secondary | ICD-10-CM | POA: Diagnosis not present

## 2016-04-28 DIAGNOSIS — I2510 Atherosclerotic heart disease of native coronary artery without angina pectoris: Secondary | ICD-10-CM | POA: Diagnosis not present

## 2016-04-28 DIAGNOSIS — D72829 Elevated white blood cell count, unspecified: Secondary | ICD-10-CM

## 2016-04-28 DIAGNOSIS — Z7951 Long term (current) use of inhaled steroids: Secondary | ICD-10-CM

## 2016-04-28 DIAGNOSIS — R112 Nausea with vomiting, unspecified: Secondary | ICD-10-CM | POA: Diagnosis not present

## 2016-04-28 DIAGNOSIS — D869 Sarcoidosis, unspecified: Secondary | ICD-10-CM | POA: Diagnosis present

## 2016-04-28 DIAGNOSIS — E785 Hyperlipidemia, unspecified: Secondary | ICD-10-CM | POA: Diagnosis present

## 2016-04-28 DIAGNOSIS — Z8619 Personal history of other infectious and parasitic diseases: Secondary | ICD-10-CM | POA: Diagnosis present

## 2016-04-28 DIAGNOSIS — R079 Chest pain, unspecified: Secondary | ICD-10-CM | POA: Diagnosis not present

## 2016-04-28 DIAGNOSIS — I441 Atrioventricular block, second degree: Secondary | ICD-10-CM | POA: Diagnosis present

## 2016-04-28 DIAGNOSIS — E7800 Pure hypercholesterolemia, unspecified: Secondary | ICD-10-CM

## 2016-04-28 HISTORY — DX: Other long term (current) drug therapy: Z79.899

## 2016-04-28 LAB — COMPREHENSIVE METABOLIC PANEL
ALBUMIN: 4 g/dL (ref 3.5–5.0)
ALT: 24 U/L (ref 17–63)
AST: 26 U/L (ref 15–41)
Alkaline Phosphatase: 61 U/L (ref 38–126)
Anion gap: 7 (ref 5–15)
BUN: 20 mg/dL (ref 6–20)
CHLORIDE: 102 mmol/L (ref 101–111)
CO2: 27 mmol/L (ref 22–32)
CREATININE: 0.85 mg/dL (ref 0.61–1.24)
Calcium: 9.2 mg/dL (ref 8.9–10.3)
GFR calc non Af Amer: >60 mL/min (ref >60)
Glucose, Bld: 170 mg/dL — ABNORMAL HIGH (ref 65–99)
Potassium: 3.7 mmol/L (ref 3.5–5.1)
Sodium: 136 mmol/L (ref 135–145)
Total Bilirubin: 0.8 mg/dL (ref 0.3–1.2)
Total Protein: 7.8 g/dL (ref 6.5–8.1)

## 2016-04-28 LAB — CBC WITH DIFFERENTIAL/PLATELET
BASOS ABS: 0.1 10*3/uL (ref 0–0.1)
BASOS PCT: 1 %
Eosinophils Absolute: 0.6 10*3/uL (ref 0–0.7)
Eosinophils Relative: 5 %
HCT: 42.6 % (ref 40.0–52.0)
Hemoglobin: 14.4 g/dL (ref 13.0–18.0)
Lymphocytes Relative: 11 %
Lymphs Abs: 1.5 10*3/uL (ref 1.0–3.6)
MCH: 27.2 pg (ref 26.0–34.0)
MCHC: 33.8 g/dL (ref 32.0–36.0)
MCV: 80.4 fL (ref 80.0–100.0)
MONO ABS: 0.9 10*3/uL (ref 0.2–1.0)
Monocytes Relative: 7 %
NEUTROS ABS: 10.1 10*3/uL — AB (ref 1.4–6.5)
Neutrophils Relative %: 76 %
PLATELETS: 243 10*3/uL (ref 150–440)
RBC: 5.3 MIL/uL (ref 4.40–5.90)
RDW: 15.8 % — AB (ref 11.5–14.5)
WBC: 13.3 10*3/uL — ABNORMAL HIGH (ref 3.8–10.6)

## 2016-04-28 LAB — BRAIN NATRIURETIC PEPTIDE: B Natriuretic Peptide: 98 pg/mL (ref 0.0–100.0)

## 2016-04-28 LAB — TROPONIN I

## 2016-04-28 LAB — LIPASE, BLOOD: Lipase: 24 U/L (ref 11–51)

## 2016-04-28 LAB — GLUCOSE, CAPILLARY
GLUCOSE-CAPILLARY: 101 mg/dL — AB (ref 65–99)
Glucose-Capillary: 120 mg/dL — ABNORMAL HIGH (ref 65–99)
Glucose-Capillary: 161 mg/dL — ABNORMAL HIGH (ref 65–99)
Glucose-Capillary: 115 mg/dL — ABNORMAL HIGH (ref 65–99)

## 2016-04-28 LAB — MAGNESIUM: MAGNESIUM: 1.6 mg/dL — AB (ref 1.7–2.4)

## 2016-04-28 MED ORDER — ACETAMINOPHEN 325 MG PO TABS: 650.0000 mg | ORAL_TABLET | Freq: Four times a day (QID) | ORAL | Status: DC | PRN

## 2016-04-28 MED ORDER — LOSARTAN POTASSIUM 50 MG PO TABS
50.0000 mg | ORAL_TABLET | Freq: Every day | ORAL | Status: DC
  Administered 2016-04-28 – 2016-04-30 (×3): 50 mg via ORAL
  Filled 2016-04-28 (×3): qty 1

## 2016-04-28 MED ORDER — LABETALOL HCL 5 MG/ML IV SOLN: 10.0000 mg | INTRAVENOUS | Status: DC | PRN

## 2016-04-28 MED ORDER — NITROGLYCERIN 0.4 MG SL SUBL
SUBLINGUAL_TABLET | SUBLINGUAL | Status: AC
  Filled 2016-04-28: qty 1

## 2016-04-28 MED ORDER — POTASSIUM CHLORIDE CRYS ER 20 MEQ PO TBCR
30.0000 meq | EXTENDED_RELEASE_TABLET | Freq: Once | ORAL | Status: AC
  Administered 2016-04-28: 30 meq via ORAL
  Filled 2016-04-28: qty 1

## 2016-04-28 MED ORDER — ROSUVASTATIN CALCIUM 10 MG PO TABS
20.0000 mg | ORAL_TABLET | Freq: Every day | ORAL | Status: DC
  Administered 2016-04-28 – 2016-05-01 (×4): 20 mg via ORAL
  Filled 2016-04-28 (×4): qty 2

## 2016-04-28 MED ORDER — SODIUM CHLORIDE 0.9% FLUSH
3.0000 mL | Freq: Two times a day (BID) | INTRAVENOUS | Status: DC
  Administered 2016-04-28 – 2016-05-01 (×7): 3 mL via INTRAVENOUS

## 2016-04-28 MED ORDER — MORPHINE SULFATE (PF) 4 MG/ML IV SOLN
4.0000 mg | INTRAVENOUS | Status: DC | PRN
  Administered 2016-04-28 – 2016-04-30 (×6): 4 mg via INTRAVENOUS
  Filled 2016-04-28 (×6): qty 1

## 2016-04-28 MED ORDER — PIPERACILLIN-TAZOBACTAM 3.375 G IVPB 30 MIN
3.3750 g | Freq: Once | INTRAVENOUS | Status: AC
  Administered 2016-04-28: 3.375 g via INTRAVENOUS
  Filled 2016-04-28: qty 50

## 2016-04-28 MED ORDER — TIOTROPIUM BROMIDE MONOHYDRATE 18 MCG IN CAPS
18.0000 ug | ORAL_CAPSULE | Freq: Every morning | RESPIRATORY_TRACT | Status: DC
  Filled 2016-04-28: qty 5

## 2016-04-28 MED ORDER — ACETAMINOPHEN 650 MG RE SUPP: 650.0000 mg | Freq: Four times a day (QID) | RECTAL | Status: DC | PRN

## 2016-04-28 MED ORDER — HYDRALAZINE HCL 20 MG/ML IJ SOLN: 10.0000 mg | INTRAMUSCULAR | Status: DC | PRN

## 2016-04-28 MED ORDER — CARVEDILOL 12.5 MG PO TABS
12.5000 mg | ORAL_TABLET | Freq: Two times a day (BID) | ORAL | Status: DC
  Administered 2016-04-28 – 2016-05-02 (×9): 12.5 mg via ORAL
  Filled 2016-04-28 (×9): qty 1

## 2016-04-28 MED ORDER — AMLODIPINE BESYLATE 5 MG PO TABS
5.0000 mg | ORAL_TABLET | Freq: Every day | ORAL | Status: DC
  Administered 2016-04-28 – 2016-04-29 (×2): 5 mg via ORAL
  Filled 2016-04-28 (×2): qty 1

## 2016-04-28 MED ORDER — ASPIRIN 81 MG PO CHEW
81.0000 mg | CHEWABLE_TABLET | Freq: Every day | ORAL | Status: DC
  Filled 2016-04-28: qty 1

## 2016-04-28 MED ORDER — PIPERACILLIN-TAZOBACTAM 3.375 G IVPB
3.3750 g | Freq: Three times a day (TID) | INTRAVENOUS | Status: DC
  Administered 2016-04-28 – 2016-05-01 (×9): 3.375 g via INTRAVENOUS
  Filled 2016-04-28 (×9): qty 50

## 2016-04-28 MED ORDER — FENTANYL CITRATE (PF) 100 MCG/2ML IJ SOLN
100.0000 ug | INTRAMUSCULAR | Status: DC | PRN
  Administered 2016-04-28 (×2): 100 ug via INTRAVENOUS
  Filled 2016-04-28 (×2): qty 2

## 2016-04-28 MED ORDER — INSULIN ASPART 100 UNIT/ML ~~LOC~~ SOLN
0.0000 [IU] | Freq: Three times a day (TID) | SUBCUTANEOUS | Status: DC
  Administered 2016-04-28: 2 [IU] via SUBCUTANEOUS
  Administered 2016-04-29 – 2016-04-30 (×3): 1 [IU] via SUBCUTANEOUS
  Administered 2016-04-30: 2 [IU] via SUBCUTANEOUS
  Administered 2016-04-30 – 2016-05-02 (×5): 1 [IU] via SUBCUTANEOUS
  Filled 2016-04-28 (×5): qty 1
  Filled 2016-04-28: qty 2
  Filled 2016-04-28 (×3): qty 1
  Filled 2016-04-28: qty 2

## 2016-04-28 MED ORDER — MORPHINE SULFATE (PF) 2 MG/ML IV SOLN
2.0000 mg | INTRAVENOUS | Status: DC | PRN
  Administered 2016-04-30 (×2): 2 mg via INTRAVENOUS
  Filled 2016-04-28: qty 1

## 2016-04-28 MED ORDER — ONDANSETRON HCL 4 MG/2ML IJ SOLN
4.0000 mg | Freq: Four times a day (QID) | INTRAMUSCULAR | Status: DC | PRN
  Administered 2016-04-28 – 2016-04-30 (×4): 4 mg via INTRAVENOUS
  Filled 2016-04-28 (×3): qty 2

## 2016-04-28 MED ORDER — ALUM & MAG HYDROXIDE-SIMETH 200-200-20 MG/5ML PO SUSP
30.0000 mL | Freq: Four times a day (QID) | ORAL | Status: DC | PRN
  Filled 2016-04-28: qty 30

## 2016-04-28 MED ORDER — TIOTROPIUM BROMIDE MONOHYDRATE 2.5 MCG/ACT IN AERS: 2.5000 ug | INHALATION_SPRAY | Freq: Every day | RESPIRATORY_TRACT | Status: DC

## 2016-04-28 MED ORDER — DEXTROSE-NACL 5-0.45 % IV SOLN
INTRAVENOUS | Status: DC
  Administered 2016-04-28 – 2016-05-01 (×7): via INTRAVENOUS

## 2016-04-28 MED ORDER — ONDANSETRON HCL 4 MG PO TABS
4.0000 mg | ORAL_TABLET | Freq: Four times a day (QID) | ORAL | Status: DC | PRN
  Filled 2016-04-28: qty 1

## 2016-04-28 MED ORDER — ONDANSETRON HCL 4 MG/2ML IJ SOLN
4.0000 mg | Freq: Once | INTRAMUSCULAR | Status: AC
  Administered 2016-04-28: 4 mg via INTRAVENOUS
  Filled 2016-04-28: qty 2

## 2016-04-28 NOTE — Telephone Encounter (Signed)
PATIENT WAS ADMITTED TO [facility] WITH ACTUE CHOLECYSTITIS TODAY. WILL LIKELY NOT BE [place] HIS LAB APPT TOMORROW
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
## 2016-04-28 NOTE — Progress Notes (Signed)
Seeing the patient in the room, the pain is getting better, he is also seen by cardiologist and surgery team earlier today. On IV antibiotic for acute cholecystitis. Further plan for either surgery or drainage tube tomorrow as per surgical team.

## 2016-04-28 NOTE — Progress Notes (Signed)
Pharmacy Antibiotic Note  Ricky Abbott is a 72 y.o. male admitted on 04/28/2016 with acute cholecystitis.  Pharmacy has been consulted for Zosyn dosing.  Plan: Zosyn 3.375g IV q8h (4 hour infusion).  Height: 5\' 10"  (177.8 cm) Weight: 202 lb (91.6 kg) IBW/kg (Calculated) : 73  Temp (24hrs), Avg:97.7 F (36.5 C), Min:97.7 F (36.5 C), Max:97.7 F (36.5 C)   Recent Labs Lab 04/28/16 0433  WBC 13.3*  CREATININE 0.85    Estimated Creatinine Clearance: 92 mL/min (by C-G formula based on SCr of 0.85 mg/dL).    Allergies  Allergen Reactions  . Peanut-Containing Drug Products Swelling    Swelling, rash,     Antimicrobials this admission: Zosyn  3/19 >>       >>    Dose adjustments this admission:    Microbiology results: 3/19 BCx: pending   UCx:      Sputum:      MRSA PCR:    Thank you for allowing pharmacy to be a part of this patient's care.  Jester Klingberg A 04/28/2016 7:51 AM

## 2016-04-28 NOTE — ED Triage Notes (Signed)
Pt arrived via pov stating to nurse first "I feel like I am having a heart attack." Pt rushed immediatly back to room 11 and was met by nurse, tech, and MD. Pt states he had a heart attack in the past and was afraid it was occurring again. Pt took 2 46m asa and 1 nitroglycerin before arriving.

## 2016-04-28 NOTE — H&P (Signed)
[facility] [place] NAME: Ricky Abbott    MR#:  [id number]  DATE OF BIRTH:  18-Jul-1945  DATE OF ADMISSION:  04/28/2016  PRIMARY CARE PHYSICIAN: Crecencio Mc, MD   REQUESTING/REFERRING PHYSICIAN:   CHIEF COMPLAINT:   Chief Complaint  Patient presents with  . Chest Pain    HISTORY OF PRESENT ILLNESS: Ricky Abbott  is a 71 y.o. male with a known history of Coronary artery disease, diabetes mellitus type 2, hypertension, hyperlipidemia, posttraumatic stress disorder, sarcoidosis presented to the emergency room with abdominal pain since last night 9 PM. The pain is located in the epigastrium and right upper quadrant. Pain is sharp in nature 9 out of 10 on scale of 1-10. Patient had severe bouts of vomiting almost 14 times in the last 24 hours and also has nausea and dry heaves. He was evaluated in the emergency room with a ultrasound of the abdomen which revealed cholecystitis. Patient was given IV Zosyn antibiotic in the emergency room. No complaints of any chest pain, shortness of breath and palpitations. Patient also felt warm but no evidence of any fever. Hospitalist service was consulted for further care of the patient.  PAST MEDICAL HISTORY:   Past Medical History:  Diagnosis Date  . Allergy   . Asthma   . CAD, multiple vessel   . Diabetes mellitus without complication ([place])   . History of chicken pox   . Hyperlipidemia   . Hypertension   . MI (myocardial infarction)   . PTSD (post-traumatic stress disorder)    [place] [facility]  . Sarcoidosis of lung ([place])     PAST SURGICAL HISTORY: Past Surgical History:  Procedure Laterality Date  . [place], [place]  . CARDIAC CATHETERIZATION     [facility]   . [place], 05/2012   x 2    SOCIAL HISTORY:  Social History  Substance Use Topics  . Smoking status: Former Smoker    Packs/day: 4.00    Years: 25.00    Types:  Cigarettes    Quit date: 03/24/1981  . Smokeless tobacco: Never Used     Comment: quit 30 years asgo  . Alcohol use No    FAMILY HISTORY:  Family History  Problem Relation Age of Onset  . Cancer Mother   . Hyperlipidemia Mother   . Hypertension Mother   . Cancer Father     stomach  . Hyperlipidemia Father   . Hypertension Father   . Cancer Brother     bone cancer    DRUG ALLERGIES:  Allergies  Allergen Reactions  . Peanut-Containing Drug Products Swelling    Swelling, rash,     REVIEW OF SYSTEMS:   CONSTITUTIONAL: No fever, fatigue or weakness.  EYES: No blurred or double vision.  EARS, NOSE, AND THROAT: No tinnitus or ear pain.  RESPIRATORY: No cough, shortness of breath, wheezing or hemoptysis.  CARDIOVASCULAR: No chest pain, orthopnea, edema.  GASTROINTESTINAL: Has nausea, vomiting,  abdominal pain. No diarrhoea  GENITOURINARY: No dysuria, hematuria.  ENDOCRINE: No polyuria, nocturia,  HEMATOLOGY: No anemia, easy bruising or bleeding SKIN: No rash or lesion. MUSCULOSKELETAL: No joint pain or arthritis.   NEUROLOGIC: No tingling, numbness, weakness.  PSYCHIATRY: No anxiety or depression.   MEDICATIONS AT HOME:  Prior to Admission medications   Medication Sig Start Date End Date Taking? Authorizing Provider  carvedilol (COREG) 12.5 MG tablet Take 0.5 tablets (6.25  mg total) by mouth 2 (two) times daily with a meal. 04/24/16  Yes Crecencio Mc, MD  clopidogrel (PLAVIX) 75 MG tablet Take 1 tablet (75 mg total) by mouth daily. 04/24/16  Yes Crecencio Mc, MD  amLODipine (NORVASC) 10 MG tablet Take 1 tablet (10 mg total) by mouth daily. 07/19/15   Crecencio Mc, MD  aspirin 81 MG tablet Take 81 mg by mouth daily. Reported on 02/19/2015 09/02/13   Minna Merritts, MD  LORazepam (ATIVAN) 1 MG tablet Take 1 tablet (1 mg total) by mouth at bedtime. 01/14/16   Crecencio Mc, MD  losartan (COZAAR) 50 MG tablet Take 1 tablet (50 mg total) by mouth daily. 04/07/16   Crecencio Mc, MD  nitroGLYCERIN (NITROSTAT) 0.4 MG SL tablet Place 1 tablet (0.4 mg total) under the tongue every 5 (five) minutes as needed for chest pain. 02/07/15   Crecencio Mc, MD  omeprazole (PRILOSEC) 20 MG capsule Take 1 capsule (20 mg total) by mouth daily. 04/24/16   Crecencio Mc, MD  rosuvastatin (CRESTOR) 40 MG tablet Take 1 tablet (40 mg total) by mouth daily. 04/21/16   Crecencio Mc, MD  sertraline (ZOLOFT) 100 MG tablet Take 1 tablet (100 mg total) by mouth daily. 04/24/16   Crecencio Mc, MD  Tiotropium Bromide Monohydrate (SPIRIVA RESPIMAT) 2.5 MCG/ACT AERS Inhale 2.5 mcg into the lungs daily. 03/02/14   Vishal Mungal, MD      PHYSICAL EXAMINATION:   VITAL SIGNS: Blood pressure (!) 170/64, pulse (!) 57, temperature 97.7 F (36.5 C), temperature source Oral, resp. rate 18, height 5\' 10"  (1.778 m), weight 91.6 kg (202 lb), SpO2 100 %.  GENERAL:  71 y.o.-year-old patient lying in the bed with no acute distress.  EYES: Pupils equal, round, reactive to light and accommodation. No scleral icterus. Extraocular muscles intact.  HEENT: Head atraumatic, normocephalic. Oropharynx and nasopharynx clear.  NECK:  Supple, no jugular venous distention. No thyroid enlargement, no tenderness.  LUNGS: Normal breath sounds bilaterally, no wheezing, rales,rhonchi or crepitation. No use of accessory muscles of respiration.  CARDIOVASCULAR: S1, S2 normal. No murmurs, rubs, or gallops.  ABDOMEN: Soft, tenderness epigastrium and right upper quadrant, nondistended. Bowel sounds decreased. No organomegaly or mass.  EXTREMITIES: No pedal edema, cyanosis, or clubbing.  NEUROLOGIC: Cranial nerves II through XII are intact. Muscle strength 5/5 in all extremities. Sensation intact. Gait not checked.  PSYCHIATRIC: The patient is alert and oriented x 3.  SKIN: No obvious rash, lesion, or ulcer.   LABORATORY PANEL:   CBC  Recent Labs Lab 04/28/16 0433  WBC 13.3*  HGB 14.4  HCT 42.6  PLT 243  MCV 80.4   MCH 27.2  MCHC 33.8  RDW 15.8*  LYMPHSABS 1.5  MONOABS 0.9  EOSABS 0.6  BASOSABS 0.1   ------------------------------------------------------------------------------------------------------------------  Chemistries   Recent Labs Lab 04/28/16 0433  NA 136  K 3.7  CL 102  CO2 27  GLUCOSE 170*  BUN 20  CREATININE 0.85  CALCIUM 9.2  AST 26  ALT 24  ALKPHOS 61  BILITOT 0.8   ------------------------------------------------------------------------------------------------------------------ estimated creatinine clearance is 92 mL/min (by C-G formula based on SCr of 0.85 mg/dL). ------------------------------------------------------------------------------------------------------------------ No results for input(s): TSH, T4TOTAL, T3FREE, THYROIDAB in the last 72 hours.  Invalid input(s): FREET3   Coagulation profile No results for input(s): INR, PROTIME in the last 168 hours. ------------------------------------------------------------------------------------------------------------------- No results for input(s): DDIMER in the last 72 hours. -------------------------------------------------------------------------------------------------------------------  Cardiac Enzymes  Recent  Labs Lab 04/28/16 0433  TROPONINI <0.03   ------------------------------------------------------------------------------------------------------------------ Invalid input(s): POCBNP  ---------------------------------------------------------------------------------------------------------------  Urinalysis    Component Value Date/Time   BILIRUBINUR neg 04/07/2016 1421   PROTEINUR neg 04/07/2016 1421   UROBILINOGEN 0.2 04/07/2016 1421   NITRITE neg 04/07/2016 1421   LEUKOCYTESUR Negative 04/07/2016 1421     RADIOLOGY: Dg Chest Portable 1 View  Result Date: 04/28/2016 CLINICAL DATA:  Chest pain.  History of heart attack. EXAM: PORTABLE CHEST 1 VIEW COMPARISON:  None. FINDINGS:  Normal heart size and pulmonary vascularity. Mediastinal contours appear intact. Calcification of the aorta. No focal airspace disease or consolidation in the lungs. No blunting of costophrenic angles. No pneumothorax. Degenerative changes in the spine. IMPRESSION: No active disease. Electronically Signed   By: Lucienne Capers M.D.   On: 04/28/2016 05:03   US Abdomen Limited Ruq  Result Date: 04/28/2016 CLINICAL DATA:  Epigastric pain for 8 hours. EXAM: US ABDOMEN LIMITED - RIGHT UPPER QUADRANT COMPARISON:  None. FINDINGS: Gallbladder: Cholelithiasis with multiple stones in the gallbladder neck measuring up to the 12 mm diameter. Layering sludge in the gallbladder. No gallbladder wall thickening or edema. Murphy's sign is positive. Common bile duct: Diameter: 5.2 mm, normal Liver: No focal lesion identified. Within normal limits in parenchymal echogenicity. IMPRESSION: Cholelithiasis and gallbladder sludge with positive Murphy's sign. Changes are consistent with acute cholecystitis in the appropriate clinical setting. Electronically Signed   By: Lucienne Capers M.D.   On: 04/28/2016 05:48    EKG: Orders placed or performed during the hospital encounter of 04/28/16  . EKG 12-Lead  . EKG 12-Lead  . ED EKG  . ED EKG  . EKG 12-Lead  . EKG 12-Lead  . EKG 12-Lead  . EKG 12-Lead    IMPRESSION AND PLAN: 71 year old male patient with history of diabetes mellitus, coronary artery disease, hypertension, sarcoidosis, hyperlipidemia presented to the emergency room with abdominal pain, nausea and vomiting. Admitting diagnosis 1. Acute cholecystitis 2. Intractable nausea vomiting 3. Abdominal pain 4. Leukocytosis 5. Coronary artery disease Treatment plan Admit patient to medical floor IV fluid hydration Keep patient nothing by mouth Start patient on IV Zosyn antibiotic Surgical consultation for acute cholecystitis Pain management with IV morphine Antiemetic medication for nausea and  vomiting Hold aspirin and Plavix DVT prophylaxis sequential compression device to lower extremities. All the records are reviewed and case discussed with ED provider. Management plans discussed with the patient, family and they are in agreement.  CODE STATUS:FULL CODE Code Status History    This patient does not have a recorded code status. Please follow your organizational policy for patients in this situation.       TOTAL TIME TAKING CARE OF THIS PATIENT: 50 minutes.    Saundra Shelling M.D on 04/28/2016 at 7:30 AM  Between 7am to 6pm - Pager - [phone]  After 6pm go to [email or web address] - password [email or web address] [facility]  Office  [phone]  CC: Primary care physician; Crecencio Mc, MD

## 2016-04-28 NOTE — Consult Note (Signed)
Cardiology Consultation Note  Patient ID: STEFEN JUBA, MRN: [id number], DOB/AGE: 07-31-1945 71 y.o. Admit date: 04/28/2016   Date of Consult: 04/28/2016 Primary Physician: Crecencio Mc, MD Primary Cardiologist: Dr. Rockey Situ, MD Requesting Physician: Dr. Estanislado Pandy, MD  Chief Complaint: RUQ pain, nausea, vomiting Reason for Consult: Preoperative cardiac evaluation  HPI: 71 y.o. male with h/o CAD status post remote stenting approximate 20 years ago and for [place] status post MI and 05/2012 with stent placed at that time at [facility], alcoholic hepatitis, diabetes type 2, PTSD, prior tobacco abuse quitting in 1984, prior alcohol abuse, anxiety/depression, HTN, HLD, and intermittent medication noncompliance secondary to financial hardship who presented to [facility] with 12 hour history of right upper quadrant abdominal pain and numerous episodes of vomiting found to have acalculus cholecystitis. Cardiology is asked to evaluate for preoperative clearance.  Most recent stress test from 12/2015 showed no ischemia with prior scar in the mid to apical inferior wall. EF estimated at 63%. Patient was most recently seen by Dr. Cherly Beach on 04/15/2016 and at that time he reported numerous issues including having nightly chest pain only from the hours of 7 PM to 8 PM that occurred while sitting on his mattress watching TV and eating crackers. He did take sublingual nitroglycerin with improvement in symptoms. He saw PCP regarding these issues and following this visit his symptoms resolved and has not had any atypical chest pain since. The patient has been without any chest pain while cleaning his house or ambulating throughout stores and a quick fashion.  On the evening of 3/18 patient developed sudden onset severe right upper quadrant abdominal pain that radiated to the epigastric region. It was associated with nausea and vomiting. Patient reports he has vomited at least 14 times in the past 12 hours, most recently  approximately 3 AM. No associated chest pain, shortness of breath, diaphoresis, dizziness, palpitations, presyncope, or syncopal BP. Patient last took Plavix on the morning of 3/18 along with his 81 mg aspirin. Because of his persistent right upper quadrant abdominal pain with associated nausea and vomiting the patient took one sublingual nitroglycerin without improvement in symptoms. This prompted him to come to the [facility] ED for further evaluation. Upon his arrival to [facility] was noted to have blood pressure of 171/74 that has briefly trended up to 751 mmHg systolic. Heart rate 56 bpm, temperature 97.22F, oxygen saturation 100% on room air, weight 202 pounds. Labs showed troponin negative 1, BNP 98, lipase 24, normal LFTs, serum creatinine 0.85, potassium 3.7, white blood cell count 13.3, hemoglobin 14.4, platelets 243, blood cultures negative 2 at 12 hours. Chest x-ray without acute disease. Right upper quadrant abdominal ultrasound showed cholelithiasis and gallbladder sludge with positive Murphy's sign consistent with acute cholecystitis. Upon his admission patient's dual antiplatelet therapy was held by internal medicine. His blood pressure has remained elevated, currently at 186/81. He continues to note right upper quadrant abdominal pain with associated nausea without emesis. No further chest pain since his atypical symptoms back in February.  Past Medical History:  Diagnosis Date  . Allergy   . Asthma   . CAD, multiple vessel   . Diabetes mellitus without complication ([place])   . History of chicken pox   . Hyperlipidemia   . Hypertension   . MI (myocardial infarction)   . PTSD (post-traumatic stress disorder)    [place] Vet  . Sarcoidosis of lung ([place])       Most Recent Cardiac Studies: As above  Surgical History:  Past Surgical History:  Procedure Laterality Date  . [place], [place]  . CARDIAC CATHETERIZATION     [facility]    . [place], 05/2012   x 2     Home Meds: Prior to Admission medications   Medication Sig Start Date End Date Taking? Authorizing Provider  carvedilol (COREG) 12.5 MG tablet Take 0.5 tablets (6.25 mg total) by mouth 2 (two) times daily with a meal. 04/24/16  Yes Crecencio Mc, MD  clopidogrel (PLAVIX) 75 MG tablet Take 1 tablet (75 mg total) by mouth daily. 04/24/16  Yes Crecencio Mc, MD  amLODipine (NORVASC) 10 MG tablet Take 1 tablet (10 mg total) by mouth daily. 07/19/15   Crecencio Mc, MD  aspirin 81 MG tablet Take 81 mg by mouth daily. Reported on 02/19/2015 09/02/13   Minna Merritts, MD  LORazepam (ATIVAN) 1 MG tablet Take 1 tablet (1 mg total) by mouth at bedtime. 01/14/16   Crecencio Mc, MD  losartan (COZAAR) 50 MG tablet Take 1 tablet (50 mg total) by mouth daily. 04/07/16   Crecencio Mc, MD  nitroGLYCERIN (NITROSTAT) 0.4 MG SL tablet Place 1 tablet (0.4 mg total) under the tongue every 5 (five) minutes as needed for chest pain. 02/07/15   Crecencio Mc, MD  omeprazole (PRILOSEC) 20 MG capsule Take 1 capsule (20 mg total) by mouth daily. 04/24/16   Crecencio Mc, MD  rosuvastatin (CRESTOR) 40 MG tablet Take 1 tablet (40 mg total) by mouth daily. 04/21/16   Crecencio Mc, MD  sertraline (ZOLOFT) 100 MG tablet Take 1 tablet (100 mg total) by mouth daily. 04/24/16   Crecencio Mc, MD  Tiotropium Bromide Monohydrate (SPIRIVA RESPIMAT) 2.5 MCG/ACT AERS Inhale 2.5 mcg into the lungs daily. 03/02/14   Vilinda Boehringer, MD    Inpatient Medications:  . amLODipine  5 mg Oral Daily  . aspirin  81 mg Oral Daily  . carvedilol  12.5 mg Oral BID WC  . insulin aspart  0-9 Units Subcutaneous TID WC  . piperacillin-tazobactam (ZOSYN)  IV  3.375 g Intravenous Q8H  . rosuvastatin  20 mg Oral q1800  . sodium chloride flush  3 mL Intravenous Q12H  . tiotropium  18 mcg Inhalation q morning - 10a   . dextrose 5 % and 0.45% NaCl 100 mL/hr at 04/28/16 1011    Allergies:    Allergies  Allergen Reactions  . Peanut-Containing Drug Products Swelling    Swelling, rash,     Social History   Social History  . Marital status: Single    Spouse name: N/A  . Number of children: N/A  . Years of education: N/A   Occupational History  . Not on file.   Social History Main Topics  . Smoking status: Former Smoker    Packs/day: 4.00    Years: 25.00    Types: Cigarettes    Quit date: 03/24/1981  . Smokeless tobacco: Never Used     Comment: quit 30 years asgo  . Alcohol use No  . Drug use: No  . Sexual activity: No   Other Topics Concern  . Not on file   Social History Narrative   Yvone Neu grew up in [place], [place]. He moved to the [place] area in 2013. 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     Family History  Problem Relation Age of Onset  . Cancer Mother   .  Hyperlipidemia Mother   . Hypertension Mother   . Cancer Father     stomach  . Hyperlipidemia Father   . Hypertension Father   . Cancer Brother     bone cancer     Review of Systems: Review of Systems  Constitutional: Positive for malaise/fatigue. Negative for chills, diaphoresis, fever and weight loss.  HENT: Negative for congestion.   Eyes: Negative for discharge and redness.  Respiratory: Negative for cough, hemoptysis, sputum production, shortness of breath and wheezing.   Cardiovascular: Negative for chest pain, palpitations, orthopnea, claudication, leg swelling and PND.  Gastrointestinal: Positive for abdominal pain, nausea and vomiting. Negative for blood in stool, heartburn and melena.  Genitourinary: Negative for hematuria.  Musculoskeletal: Positive for myalgias. Negative for falls.  Skin: Negative for rash.  Neurological: Positive for headaches. Negative for dizziness, tingling, tremors, sensory change, speech change, focal weakness, loss of consciousness and weakness.  Endo/Heme/Allergies: Does not bruise/bleed easily.  Psychiatric/Behavioral: Negative for substance abuse. The  patient is not nervous/anxious.   All other systems reviewed and are negative.   Labs:  Recent Labs  04/28/16 0433  TROPONINI <0.03   Lab Results  Component Value Date   WBC 13.3 (H) 04/28/2016   HGB 14.4 04/28/2016   HCT 42.6 04/28/2016   MCV 80.4 04/28/2016   PLT 243 04/28/2016     Recent Labs Lab 04/28/16 0433  NA 136  K 3.7  CL 102  CO2 27  BUN 20  CREATININE 0.85  CALCIUM 9.2  PROT 7.8  BILITOT 0.8  ALKPHOS 61  ALT 24  AST 26  GLUCOSE 170*   Lab Results  Component Value Date   CHOL 171 04/18/2016   HDL 29.90 (L) 04/18/2016   LDLCALC 110 (H) 04/18/2016   TRIG 157.0 (H) 04/18/2016   No results found for: DDIMER  Radiology/Studies:  Dg Cervical Spine Complete  Result Date: 04/07/2016 CLINICAL DATA:  Neck pain, no known injury, initial encounter EXAM: CERVICAL SPINE - COMPLETE 4+ VIEW COMPARISON:  None. FINDINGS: Seven cervical segments are well visualized. Vertebral body height is well maintained. Large anterior osteophytes are noted at multiple levels. Mild disc space narrowing is noted at C6-7. Mild facet hypertrophic changes are seen as well. No soft tissue abnormality is noted. IMPRESSION: Multilevel degenerative change without acute abnormality. Electronically Signed   By: Inez Catalina M.D.   On: 04/07/2016 15:24   Dg Chest Portable 1 View  Result Date: 04/28/2016 CLINICAL DATA:  Chest pain.  History of heart attack. EXAM: PORTABLE CHEST 1 VIEW COMPARISON:  None. FINDINGS: Normal heart size and pulmonary vascularity. Mediastinal contours appear intact. Calcification of the aorta. No focal airspace disease or consolidation in the lungs. No blunting of costophrenic angles. No pneumothorax. Degenerative changes in the spine. IMPRESSION: No active disease. Electronically Signed   By: Lucienne Capers M.D.   On: 04/28/2016 05:03   US Abdomen Limited Ruq  Result Date: 04/28/2016 CLINICAL DATA:  Epigastric pain for 8 hours. EXAM: US ABDOMEN LIMITED - RIGHT  UPPER QUADRANT COMPARISON:  None. FINDINGS: Gallbladder: Cholelithiasis with multiple stones in the gallbladder neck measuring up to the 12 mm diameter. Layering sludge in the gallbladder. No gallbladder wall thickening or edema. Murphy's sign is positive. Common bile duct: Diameter: 5.2 mm, normal Liver: No focal lesion identified. Within normal limits in parenchymal echogenicity. IMPRESSION: Cholelithiasis and gallbladder sludge with positive Murphy's sign. Changes are consistent with acute cholecystitis in the appropriate clinical setting. Electronically Signed   By: Gwyndolyn Saxon  Gerilyn Nestle M.D.   On: 04/28/2016 05:48    EKG: Interpreted by me showed: Sinus bradycardia, 55 BPM, bifascicular block, RBBB, left anterior fascicular block Telemetry: Interpreted by me showed: Not on telemetry  Weights: Filed Weights   04/28/16 0438  Weight: 202 lb (91.6 kg)     Physical Exam: Blood pressure (!) 186/81, pulse (!) 57, temperature 97.3 F (36.3 C), temperature source Oral, resp. rate 17, height 5\' 10"  (1.778 m), weight 202 lb (91.6 kg), SpO2 100 %. Body mass index is 28.98 kg/m. General: Well developed, well nourished, in no acute distress. Head: Normocephalic, atraumatic, sclera non-icteric, no xanthomas, nares are without discharge.  Neck: Negative for carotid bruits. JVD not elevated. Lungs: Clear bilaterally to auscultation without wheezes, rales, or rhonchi. Breathing is unlabored. Heart: RRR with S1 S2. No murmurs, rubs, or gallops appreciated. Abdomen: Soft, tender right upper quadrant, non-distended with normoactive bowel sounds. No hepatomegaly. No rebound/guarding. No obvious abdominal masses. Msk:  Strength and tone appear normal for age. Extremities: No clubbing or cyanosis. No edema. Distal pedal pulses 2+ along left lower extremity, faint along right lower extremity. Neuro: Alert and oriented X 3. No facial asymmetry. No focal deficit. Moves all extremities spontaneously. Psych:  Responds  to questions appropriately with a normal affect.    Assessment and Plan:  Active Problems:   CAD, multiple vessel   Hyperlipidemia   Essential hypertension   S/P coronary artery stent placement   Hepatitis C   PTSD (post-traumatic stress disorder)   Acute cholecystitis   Pre-operative cardiovascular examination   Medication management    1. Cardiac preoperative evaluation: PREOPERATIVE CARDIAC RISK ASSESSMENT:   Revised Cardiac Risk Index:  High Risk Surgery (defined as Intraperitoneal, intrathoracic or suprainguinal vascular): yes; (cholecystectomy)  Active CAD: yes; (history of MI)  CHF: no  Cerebrovascular Disease: no   Diabetes: no; On Insulin: no  CKD (Cr >~ 2): no   Estimated Risk of Adverse Outcome: moderate risk for high risk surgery. Estimated Risk of MI, PE, VF/VT (Cardiac Arrest), Complete Heart Block: 6.6%   ACC/AHA Guidelines for "Clearance":  Step 1 - Need for Emergency Surgery: no  If Yes - go straight to OR with perioperative surveillance  Step 2 - Active Cardiac Conditions (Unstable Angina, Decompensated HF, Significant  Arrhytmias - Complete HB, Mobitz II, Symptomatic VT or SVT, Severe Aortic Stenosis - mean gradient > 40 mmHg, Valve area < 1.0 cm2): *no  If Yes - Evaluate & Treat per ACC/AHA Guidelines  Step 3 -  Low Risk Surgery: no, cholecystectomy   If Yes --> proceed to OR  If No --> Step 4  Step 4 - Functional Capacity >= 4 METS without symptoms: yes  If Yes --> proceed to OR  If No --> Step 5  Step 5 --  Clinical Risk Factors (CRF)  - Zero --> proceed to OR   2. Acute acalculous cholecystitis/nausea/vomiting: -Seen by surgery with recommendation of cholecystectomy after his Plavix has been held for approximately 5-7 days -Surgery considering cholecystostomy tube to be placed in the interim -On IV antibiotics per surgery/IM -Pain meds per IM -Check magnesium replete to goal 2.0 as indicated -Potassium found to be 3.7,  gentle repletion to goal 4.0  3. CAD status post remote stenting approximately 20 years ago, most recently PCI/DES in the setting of MI and 05/2012: -Patient with atypical chest pain in February 2018 noted while at rest sitting on his mattress, eating crackers, and only between the hours of 7 PM and  8 PM -Has been pain free since mid February -Symptoms most consistent with patient's anxiety/PTSD rather than ACS -Check echo as above -If echo is normal would likely defer nuclear stress testing prior to his surgery -Recommend continuation of aspirin 81 mg daily throughout the perioperative timeframe and thereafter -Patient last took Plavix on the morning of 3/18, this has been held upon admission -Given no recent PCI patient may be able to discontinue Plavix for good at this time pending no further intervention needed in the future -Ordered patient's home Coreg and titrate as needed pending blood pressure -Continue Crestor  4. Accelerated hypertension: -Likely multifactorial in the setting of the patient's cholecystitis with right upper quadrant abdominal pain as well as none of the patient's antihypertensives were not continued upon admission -Will restart patient on home Coreg and amlodipine -Will defer starting home dose of losartan at this time pending improvement of blood pressure as I do not want to make him hypotensive, though if needed recommend internal medicine start losartan later this afternoon  5. HLD: -Continue home Crestor  6. Hepatitis C: -Per internal medicine  7. PTSD: -Per IM -Home medications were not continued upon admission, defer to internal medicine  8. Medication management: -Patient made nothing by mouth upon admission, consider changing to him by mouth with sips with medications in an effort for patient to continue home medications, defer to IM  Signed, Christell Faith, PA-C [place] Pager: [phone] 04/28/2016, 10:47 AM

## 2016-04-28 NOTE — ED Notes (Signed)
Report to floor, to floor via stretcher.

## 2016-04-28 NOTE — ED Notes (Signed)
Ultrasound tech called with request for more pain medication. EDP made aware and gave verbal for 4 mg of morphine.

## 2016-04-28 NOTE — Telephone Encounter (Signed)
Pt coming in for labs tomorrow, please place future orders. Thank you.  

## 2016-04-28 NOTE — ED Provider Notes (Signed)
[facility] Emergency Department Provider Note    First MD Initiated Contact with Patient 04/28/16 [phone]     (approximate)  I have reviewed the triage vital signs and the nursing notes.   HISTORY  Chief Complaint Chest Pain    HPI Ricky Abbott is a 71 y.o. male with history of CAD presents with acute epigastric and right upper quadrant pain radiating into his chest that awoke the patient from sleep roughly 1 hour ago. States he is also having several episodes of nausea and nonbilious nonbloody vomiting. Patient states "I feel like I'm having a heart attack "but states that this is different from his previous heart attack as he did not have any epigastric pain or right upper quadrant pain. States her last heart attack he had had chest pressure radiating through to his back. Currently rates the pain as 10 out of 10 in severity. He took an aspirin as well as nitroglycerin prior to arrival without any improvement in symptoms.   Past Medical History:  Diagnosis Date  . Allergy   . Asthma   . CAD, multiple vessel   . Diabetes mellitus without complication ([place])   . History of chicken pox   . Hyperlipidemia   . Hypertension   . MI (myocardial infarction)   . PTSD (post-traumatic stress disorder)    [place] Vet  . Sarcoidosis of lung ([place])    Family History  Problem Relation Age of Onset  . Cancer Mother   . Hyperlipidemia Mother   . Hypertension Mother   . Cancer Father     stomach  . Hyperlipidemia Father   . Hypertension Father   . Cancer Brother     bone cancer   Past Surgical History:  Procedure Laterality Date  . [place], [place]  . CARDIAC CATHETERIZATION     [facility]   . [place], 05/2012   x 2   Patient Active Problem List   Diagnosis Date Noted  . Acute epigastric pain   . Acute cholecystitis   . Alcohol abuse 04/15/2016  . Poor diet 04/15/2016  . Transaminitis  09/30/2015  . Chronic post-traumatic stress disorder (PTSD) 05/23/2015  . Type 2 diabetes, HbA1c goal < 7% (HCC) 05/21/2015  . Erectile dysfunction 02/19/2015  . 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 Dyspnea on exertion 03/02/2014  . PTSD (post-traumatic stress disorder) 12/16/2013  . Sarcoidosis ([place]) 12/15/2013  . Hepatitis C 12/15/2013  . S/P coronary artery stent placement 09/04/2013  . Other chest pain 09/04/2013  . CAD, multiple vessel 08/15/2013  . Hyperlipidemia 08/15/2013  . Essential hypertension 08/15/2013  . Other fatigue 08/15/2013  . Tubular adenoma of colon 08/15/2013  . Need for tetanus booster 08/15/2013      Prior to Admission medications   Medication Sig Start Date End Date Taking? Authorizing Provider  amLODipine (NORVASC) 10 MG tablet Take 1 tablet (10 mg total) by mouth daily. 07/19/15   Crecencio Mc, MD  aspirin 81 MG tablet Take 81 mg by mouth daily. Reported on 02/19/2015 09/02/13   Minna Merritts, MD  carvedilol (COREG) 12.5 MG tablet Take 0.5 tablets (6.25 mg total) by mouth 2 (two) times daily with a meal. 04/24/16   Crecencio Mc, MD  clopidogrel (PLAVIX) 75 MG tablet Take 1 tablet (75 mg total) by mouth daily. 04/24/16   Crecencio Mc, MD  LORazepam (ATIVAN) 1 MG tablet Take 1 tablet (1  mg total) by mouth at bedtime. 01/14/16   Crecencio Mc, MD  losartan (COZAAR) 50 MG tablet Take 1 tablet (50 mg total) by mouth daily. 04/07/16   Crecencio Mc, MD  nitroGLYCERIN (NITROSTAT) 0.4 MG SL tablet Place 1 tablet (0.4 mg total) under the tongue every 5 (five) minutes as needed for chest pain. 02/07/15   Crecencio Mc, MD  omeprazole (PRILOSEC) 20 MG capsule Take 1 capsule (20 mg total) by mouth daily. 04/24/16   Crecencio Mc, MD  rosuvastatin (CRESTOR) 40 MG tablet Take 1 tablet (40 mg total) by mouth daily. 04/21/16   Crecencio Mc, MD  sertraline (ZOLOFT) 100 MG tablet Take 1 tablet (100 mg total) by mouth daily. 04/24/16   Crecencio Mc, MD  Tiotropium Bromide  Monohydrate (SPIRIVA RESPIMAT) 2.5 MCG/ACT AERS Inhale 2.5 mcg into the lungs daily. 03/02/14   Vilinda Boehringer, MD    Allergies Peanut-containing drug products    Social History Social History  Substance Use Topics  . Smoking status: Former Smoker    Packs/day: 4.00    Years: 25.00    Types: Cigarettes    Quit date: 03/24/1981  . Smokeless tobacco: Never Used     Comment: quit 30 years asgo  . Alcohol use No    Review of Systems Patient denies headaches, rhinorrhea, blurry vision, numbness, shortness of breath, chest pain, edema, cough, abdominal pain, nausea, vomiting, diarrhea, dysuria, fevers, rashes or hallucinations unless otherwise stated above in HPI. ____________________________________________   PHYSICAL EXAM:  VITAL SIGNS: Vitals:   04/28/16 0438 04/28/16 0704  BP: (!) 171/74 (!) 170/64  Pulse: (!) 56 (!) 57  Resp: 15 18  Temp: 97.7 F (36.5 C)     Constitutional: Alert and oriented. Acutely ill appearing Eyes: Conjunctivae are normal. PERRL. EOMI. Head: Atraumatic. Nose: No congestion/rhinnorhea. Mouth/Throat: Mucous membranes are moist.  Oropharynx non-erythematous. Neck: No stridor. Painless ROM. No cervical spine tenderness to palpation Hematological/Lymphatic/Immunilogical: No cervical lymphadenopathy. Cardiovascular: Normal rate, regular rhythm. Grossly normal heart sounds.  Good peripheral circulation. Respiratory: Normal respiratory effort.  No retractions. Lungs CTAB. Gastrointestinal: ruq and epigastric ttp with guarding.  No diffuse peritonitis. Musculoskeletal: No lower extremity tenderness nor edema.  No joint effusions. Neurologic:  Normal speech and language. No gross focal neurologic deficits are appreciated.  Skin:  Skin is warm, dry and intact. No rash noted.  ____________________________________________   LABS (all labs ordered are listed, but only abnormal results are displayed)  Results for orders placed or performed during the  hospital encounter of 04/28/16 (from the past 24 hour(s))  CBC with Differential/Platelet     Status: Abnormal   Collection Time: 04/28/16  4:33 AM  Result Value Ref Range   WBC 13.3 (H) 3.8 - 10.6 K/uL   RBC 5.30 4.40 - 5.90 MIL/uL   Hemoglobin 14.4 13.0 - 18.0 g/dL   HCT 42.6 40.0 - 52.0 %   MCV 80.4 80.0 - 100.0 fL   MCH 27.2 26.0 - 34.0 pg   MCHC 33.8 32.0 - 36.0 g/dL   RDW 15.8 (H) 11.5 - 14.5 %   Platelets 243 150 - 440 K/uL   Neutrophils Relative % 76 %   Neutro Abs 10.1 (H) 1.4 - 6.5 K/uL   Lymphocytes Relative 11 %   Lymphs Abs 1.5 1.0 - 3.6 K/uL   Monocytes Relative 7 %   Monocytes Absolute 0.9 0.2 - 1.0 K/uL   Eosinophils Relative 5 %   Eosinophils Absolute 0.6 0 -  0.7 K/uL   Basophils Relative 1 %   Basophils Absolute 0.1 0 - 0.1 K/uL  Comprehensive metabolic panel     Status: Abnormal   Collection Time: 04/28/16  4:33 AM  Result Value Ref Range   Sodium 136 135 - 145 mmol/L   Potassium 3.7 3.5 - 5.1 mmol/L   Chloride 102 101 - 111 mmol/L   CO2 27 22 - 32 mmol/L   Glucose, Bld 170 (H) 65 - 99 mg/dL   BUN 20 6 - 20 mg/dL   Creatinine, Ser 0.85 0.61 - 1.24 mg/dL   Calcium 9.2 8.9 - 10.3 mg/dL   Total Protein 7.8 6.5 - 8.1 g/dL   Albumin 4.0 3.5 - 5.0 g/dL   AST 26 15 - 41 U/L   ALT 24 17 - 63 U/L   Alkaline Phosphatase 61 38 - 126 U/L   Total Bilirubin 0.8 0.3 - 1.2 mg/dL   GFR calc non Af Amer >60 >60 mL/min   GFR calc Af Amer >60 >60 mL/min   Anion gap 7 5 - 15  Lipase, blood     Status: None   Collection Time: 04/28/16  4:33 AM  Result Value Ref Range   Lipase 24 11 - 51 U/L  Troponin I     Status: None   Collection Time: 04/28/16  4:33 AM  Result Value Ref Range   Troponin I <0.03 <0.03 ng/mL  Brain natriuretic peptide     Status: None   Collection Time: 04/28/16  4:33 AM  Result Value Ref Range   B Natriuretic Peptide 98.0 0.0 - 100.0 pg/mL   ____________________________________________  EKG My review and personal interpretation at Time: 4:30    Indication: chest pain  Rate: 55  Rhythm: sinus Axis: left Other: ivcd, non specific st changes that aere unchaged from 04/04/15 ____________________________________________  RADIOLOGY  I personally reviewed all radiographic images ordered to evaluate for the above acute complaints and reviewed radiology reports and findings.  These findings were personally discussed with the patient.  Please see medical record for radiology report.  ____________________________________________   PROCEDURES  Procedure(s) performed:  Procedures    Critical Care performed: no ____________________________________________   INITIAL IMPRESSION / ASSESSMENT AND PLAN / ED COURSE  Pertinent labs & imaging results that were available during my care of the patient were reviewed by me and considered in my medical decision making (see chart for details).  DDX: acs, cholecystitis, cholelithiasis, dissection, AAAm, gastritis  LABRANDON KNOCH is a 71 y.o. who presents to the ED with acute epigastric right upper quadrant pain as well as chest pain. Afebrile hemodynamic stable but does appear acutely distressed in significant discomfort. The patient at bedside immediately upon arrival due to concern for ACS. Still remains on differential but there is no evidence of ST elevation MI. Bedside ultrasound shows no evidence of pericardial effusion. There is normal wall motion. He has evidence of large gallbladder stone at the neck of the gallbladder and does have significant right upper quadrant pain. Given his nausea and vomiting I am concerned for acute cholelithiasis.  The patient will be placed on continuous pulse oximetry and telemetry for monitoring.  Laboratory evaluation will be sent to evaluate for the above complaints.     Clinical Course as of Apr 29 706  Mon Apr 28, 2016  0613 Patient with evidence of acute cholecystitis with cholelithiasis. Presentation, located as the patient is on aspirin and Plavix for  a stent placed to his RCA back  in 2014 and he did take his aspirin and Plavix yesterday at 8 AM. I spoke with Dr. Burt Knack regarding the patient's presentation. At this point due to his chronic medical morbidities and taking Plavix does recommend initial medical admission with surgical consultation for further resuscitation, antibiotics and evaluation management.  Have discussed with the patient and available family all diagnostics and treatments performed thus far and all questions were answered to the best of my ability. The patient demonstrates understanding and agreement with plan.   [PR]  0707 I spoke with Dr. Estanislado Pandy who kindly agrees to admit patient for further evaluation and management.   [PR]    Clinical Course User Index [PR] Merlyn Lot, MD     ____________________________________________   FINAL CLINICAL IMPRESSION(S) / ED DIAGNOSES  Final diagnoses:  Acute epigastric pain  Acute calculous cholecystitis  Leukocytosis, unspecified type  Platelet inhibition due to Plavix      NEW MEDICATIONS STARTED DURING THIS VISIT:  New Prescriptions   No medications on file     Note:  This document was prepared using Dragon voice recognition software and may include unintentional dictation errors.    Merlyn Lot, MD 04/28/16 [phone]

## 2016-04-28 NOTE — Progress Notes (Signed)
Pt seen and examined Improving abd pain Appreciate cards recs No need for immediate surgical intervention Hold off chole tube for now

## 2016-04-28 NOTE — ED Notes (Signed)
EDP at bedside with ultrasound machine. MD notes gallstone.

## 2016-04-29 ENCOUNTER — Other Ambulatory Visit: Payer: Medicare HMO

## 2016-04-29 DIAGNOSIS — K8000 Calculus of gallbladder with acute cholecystitis without obstruction: Principal | ICD-10-CM

## 2016-04-29 LAB — GLUCOSE, CAPILLARY
GLUCOSE-CAPILLARY: 155 mg/dL — AB (ref 65–99)
GLUCOSE-CAPILLARY: 140 mg/dL — AB (ref 65–99)
Glucose-Capillary: 148 mg/dL — ABNORMAL HIGH (ref 65–99)
Glucose-Capillary: 170 mg/dL — ABNORMAL HIGH (ref 65–99)

## 2016-04-29 LAB — CBC
HEMATOCRIT: 40.4 % (ref 40.0–52.0)
HEMOGLOBIN: 14 g/dL (ref 13.0–18.0)
MCH: 27.3 pg (ref 26.0–34.0)
MCHC: 34.5 g/dL (ref 32.0–36.0)
MCV: 79.1 fL — ABNORMAL LOW (ref 80.0–100.0)
Platelets: 202 10*3/uL (ref 150–440)
RBC: 5.11 MIL/uL (ref 4.40–5.90)
RDW: 16.2 % — ABNORMAL HIGH (ref 11.5–14.5)
WBC: 8.2 10*3/uL (ref 3.8–10.6)

## 2016-04-29 LAB — COMPREHENSIVE METABOLIC PANEL
ALT: 48 U/L (ref 17–63)
AST: 65 U/L — AB (ref 15–41)
Albumin: 3.5 g/dL (ref 3.5–5.0)
Alkaline Phosphatase: 78 U/L (ref 38–126)
Anion gap: 6 (ref 5–15)
BILIRUBIN TOTAL: 0.9 mg/dL (ref 0.3–1.2)
BUN: 14 mg/dL (ref 6–20)
CO2: 26 mmol/L (ref 22–32)
Calcium: 8.2 mg/dL — ABNORMAL LOW (ref 8.9–10.3)
Chloride: 102 mmol/L (ref 101–111)
Creatinine, Ser: 0.95 mg/dL (ref 0.61–1.24)
GFR calc Af Amer: >60 mL/min (ref >60)
Glucose, Bld: 131 mg/dL — ABNORMAL HIGH (ref 65–99)
Potassium: 3.6 mmol/L (ref 3.5–5.1)
Sodium: 134 mmol/L — ABNORMAL LOW (ref 135–145)
Total Protein: 6.8 g/dL (ref 6.5–8.1)

## 2016-04-29 LAB — ECHOCARDIOGRAM COMPLETE
HEIGHTINCHES: 70 in
Weight: 3232 oz

## 2016-04-29 MED ORDER — SERTRALINE HCL 50 MG PO TABS
100.0000 mg | ORAL_TABLET | Freq: Every day | ORAL | Status: DC
  Administered 2016-04-29 – 2016-05-01 (×2): 100 mg via ORAL
  Filled 2016-04-29 (×3): qty 2

## 2016-04-29 MED ORDER — PROMETHAZINE HCL 25 MG/ML IJ SOLN
12.5000 mg | Freq: Four times a day (QID) | INTRAMUSCULAR | Status: DC | PRN
  Administered 2016-04-29 – 2016-04-30 (×2): 12.5 mg via INTRAVENOUS
  Filled 2016-04-29 (×2): qty 1

## 2016-04-29 MED ORDER — LORAZEPAM 1 MG PO TABS
1.0000 mg | ORAL_TABLET | Freq: Every day | ORAL | Status: DC
  Administered 2016-04-30: 1 mg via ORAL
  Filled 2016-04-29 (×2): qty 1

## 2016-04-29 MED ORDER — AMLODIPINE BESYLATE 10 MG PO TABS
10.0000 mg | ORAL_TABLET | Freq: Every day | ORAL | Status: DC
  Administered 2016-04-30: 10 mg via ORAL
  Filled 2016-04-29: qty 1

## 2016-04-29 MED ORDER — HEPARIN SODIUM (PORCINE) 5000 UNIT/ML IJ SOLN: 5000.0000 [IU] | Freq: Three times a day (TID) | INTRAMUSCULAR | Status: DC

## 2016-04-29 NOTE — Consult Note (Signed)
Chief Complaint: Patient was seen in consultation today for percutaneous cholecystostomy at the request of Dr. Caroleen Hamman  Referring Physician(s): [place], [place]  Patient Status: [facility] - In-pt  History of Present Illness: Ricky Abbott is a 71 y.o. male admitted with RUQ abdominal pain, leukocytosis and [place] evidence of acute calculous cholecystitis.  Not a surgical candidate currently due to history of coronary artery disease with prior MI, coronary stenting and current dual anti-platelet therapy.  Patient with persistent/worsening abdominal pain.  He is frustrated about not having had any procedures performed since admission to hospital.  Past Medical History:  Diagnosis Date  . Allergy   . Asthma   . CAD, multiple vessel   . Diabetes mellitus without complication ([place])   . History of chicken pox   . Hyperlipidemia   . Hypertension   . MI (myocardial infarction)   . PTSD (post-traumatic stress disorder)    [place] Vet  . Sarcoidosis of lung [facility])     Past Surgical History:  Procedure Laterality Date  . [place], [place]  . CARDIAC CATHETERIZATION     [facility]   . [place], 05/2012   x 2    Allergies: Peanut-containing drug products  Medications: Prior to Admission medications   Medication Sig Start Date End Date Taking? Authorizing Provider  amLODipine (NORVASC) 10 MG tablet Take 1 tablet (10 mg total) by mouth daily. 07/19/15  Yes Crecencio Mc, MD  aspirin 81 MG tablet Take 81 mg by mouth daily. Reported on 02/19/2015 09/02/13  Yes Minna Merritts, MD  atorvastatin (LIPITOR) 80 MG tablet Take 80 mg by mouth daily.   Yes Historical Provider, MD  carvedilol (COREG) 12.5 MG tablet Take 0.5 tablets (6.25 mg total) by mouth 2 (two) times daily with a meal. 04/24/16  Yes Crecencio Mc, MD  clopidogrel (PLAVIX) 75 MG tablet Take 1 tablet (75 mg total) by mouth daily. 04/24/16  Yes Crecencio Mc, MD  LORazepam  (ATIVAN) 1 MG tablet Take 1 tablet (1 mg total) by mouth at bedtime. 01/14/16  Yes Crecencio Mc, MD  losartan (COZAAR) 50 MG tablet Take 1 tablet (50 mg total) by mouth daily. 04/07/16  Yes Crecencio Mc, MD  omeprazole (PRILOSEC) 20 MG capsule Take 1 capsule (20 mg total) by mouth daily. 04/24/16  Yes Crecencio Mc, MD  sertraline (ZOLOFT) 100 MG tablet Take 1 tablet (100 mg total) by mouth daily. 04/24/16  Yes Crecencio Mc, MD  nitroGLYCERIN (NITROSTAT) 0.4 MG SL tablet Place 1 tablet (0.4 mg total) under the tongue every 5 (five) minutes as needed for chest pain. 02/07/15   Crecencio Mc, MD  Tiotropium Bromide Monohydrate (SPIRIVA RESPIMAT) 2.5 MCG/ACT AERS Inhale 2.5 mcg into the lungs daily. Patient not taking: Reported on 04/28/2016 03/02/14   Vilinda Boehringer, MD     Family History  Problem Relation Age of Onset  . Cancer Mother   . Hyperlipidemia Mother   . Hypertension Mother   . Cancer Father     stomach  . Hyperlipidemia Father   . Hypertension Father   . Cancer Brother     bone cancer    Social History   Social History  . Marital status: Single    Spouse name: N/A  . Number of children: N/A  . Years of education: N/A   Social History Main Topics  . Smoking status: Former Smoker  Packs/day: 4.00    Years: 25.00    Types: Cigarettes    Quit date: 03/24/1981  . Smokeless tobacco: Never Used     Comment: quit 30 years asgo  . Alcohol use No  . Drug use: No  . Sexual activity: No   Other Topics Concern  . None   Social History Narrative   Yvone Neu grew up in [place], [place]. He moved to the [place] area in 2013. 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     Review of Systems: A 12 point ROS discussed and pertinent positives are indicated in the HPI above.  All other systems are negative.  Review of Systems  Constitutional: Negative.   HENT: Negative.   Respiratory: Negative.   Cardiovascular: Negative.   Gastrointestinal: Positive for abdominal distention and  abdominal pain. Negative for nausea and vomiting.  Genitourinary: Positive for difficulty urinating.       Some chronic difficulty initiating urinary stream.  Musculoskeletal: Negative.   Neurological: Negative.     Vital Signs: BP (!) 162/74 (BP Location: Right Arm)   Pulse 70   Temp 98.6 F (37 C) (Oral)   Resp 18   Ht 5\' 10"  (1.778 m)   Wt 202 lb (91.6 kg)   SpO2 99%   BMI 28.98 kg/m   Physical Exam  Constitutional: He is oriented to person, place, and time. No distress.  HENT:  Head: Normocephalic and atraumatic.  Cardiovascular: Normal rate, regular rhythm and normal heart sounds.   Pulmonary/Chest: Effort normal.  Abdominal: Soft. There is tenderness. There is guarding. There is no rebound.  Musculoskeletal: He exhibits no edema.  Neurological: He is alert and oriented to person, place, and time.  Skin: He is not diaphoretic.    Mallampati Score:  MD Evaluation Airway: WNL Heart: WNL Abdomen: Other (comments) Abdomen comments: Tender to palpation. Chest/ Lungs: WNL ASA  Classification: 2 Mallampati/Airway Score: One  Imaging: Dg Cervical Spine Complete  Result Date: 04/07/2016 CLINICAL DATA:  Neck pain, no known injury, initial encounter EXAM: CERVICAL SPINE - COMPLETE 4+ VIEW COMPARISON:  None. FINDINGS: Seven cervical segments are well visualized. Vertebral body height is well maintained. Large anterior osteophytes are noted at multiple levels. Mild disc space narrowing is noted at C6-7. Mild facet hypertrophic changes are seen as well. No soft tissue abnormality is noted. IMPRESSION: Multilevel degenerative change without acute abnormality. Electronically Signed   By: Inez Catalina M.D.   On: 04/07/2016 15:24   Dg Chest Portable 1 View  Result Date: 04/28/2016 CLINICAL DATA:  Chest pain.  History of heart attack. EXAM: PORTABLE CHEST 1 VIEW COMPARISON:  None. FINDINGS: Normal heart size and pulmonary vascularity. Mediastinal contours appear intact.  Calcification of the aorta. No focal airspace disease or consolidation in the lungs. No blunting of costophrenic angles. No pneumothorax. Degenerative changes in the spine. IMPRESSION: No active disease. Electronically Signed   By: Lucienne Capers M.D.   On: 04/28/2016 05:03   US Abdomen Limited Ruq  Result Date: 04/28/2016 CLINICAL DATA:  Epigastric pain for 8 hours. EXAM: US ABDOMEN LIMITED - RIGHT UPPER QUADRANT COMPARISON:  None. FINDINGS: Gallbladder: Cholelithiasis with multiple stones in the gallbladder neck measuring up to the 12 mm diameter. Layering sludge in the gallbladder. No gallbladder wall thickening or edema. Murphy's sign is positive. Common bile duct: Diameter: 5.2 mm, normal Liver: No focal lesion identified. Within normal limits in parenchymal echogenicity. IMPRESSION: Cholelithiasis and gallbladder sludge with positive Murphy's sign. Changes are consistent with acute cholecystitis in the  appropriate clinical setting. Electronically Signed   By: Lucienne Capers M.D.   On: 04/28/2016 05:48    Labs:  CBC:  Recent Labs  09/27/15 1142 04/28/16 0433 04/29/16 0806  WBC 10.0 13.3* 8.2  HGB 14.3 14.4 14.0  HCT 42.7 42.6 40.4  PLT 224.0 243 202    COAGS: No results for input(s): INR, APTT in the last 8760 hours.  BMP:  Recent Labs  10/04/15 0952 04/18/16 1202 04/28/16 0433 04/29/16 0402  NA 140 139 136 134*  K 4.6 4.3 3.7 3.6  CL 104 101 102 102  CO2 29 29 27 26   GLUCOSE 122* 122* 170* 131*  BUN 26* 13 20 14   CALCIUM 9.5 9.9 9.2 8.2*  CREATININE 1.03 0.95 0.85 0.95  GFRNONAA  --   --  >60 >60  GFRAA  --   --  >60 >60    LIVER FUNCTION TESTS:  Recent Labs  10/04/15 0952 04/18/16 1202 04/28/16 0433 04/29/16 0402  BILITOT 0.5 0.8 0.8 0.9  AST 35 16 26 65*  ALT 59* 18 24 48  ALKPHOS 86 75 61 78  PROT 7.5 7.2 7.8 6.8  ALBUMIN 4.4 4.3 4.0 3.5    TUMOR MARKERS: No results for input(s): AFPTM, CEA, CA199, CHROMGRNA in the last 8760  hours.  Assessment and Plan:  Acute cholecystitis; gallstones and GB wall thickening by [place].  WBC decreasing on antibiotics but still moderately tender to exam and with significant abdominal pain.  Not candidate for immediate cholecystectomy per Dr. Dahlia Byes.  Rationale for percutaneous GB drainage explained with probable need to have drain in place for at least 4-6 weeks prior to making decision regarding potential future cholecystectomy as outpatient after infection fully treated.  Will proceed with perc cholecystostomy tube placement tomorrow.  Risks and Benefits discussed with the patient including, but not limited to bleeding, infection, gallbladder perforation, bile leak, sepsis or even death. All of the patient's questions were answered, patient is agreeable to proceed. Consent signed and in chart.  Check PT/INR.  NPO after MN.  Thank you for this interesting consult.  I greatly enjoyed meeting KODEE RAVERT and look forward to participating in their care.  A copy of this report was sent to the requesting provider on this date.  Electronically SignedAletta Edouard T 04/29/2016, 4:04 PM     I spent a total of 40 Minutes in face to face in clinical consultation, greater than 50% of which was counseling/coordinating care for percutaneous cholecystostomy tube placement.

## 2016-04-29 NOTE — Progress Notes (Signed)
[facility] [place] NAME: Ricky Abbott    MR#:  [id number]  DATE OF BIRTH:  Mar 04, 1945  SUBJECTIVE:  CHIEF COMPLAINT:   Chief Complaint  Patient presents with  . Chest Pain   Pt came with ruq abd pain, have thickened GB and tenderness.  On IV Abx, cont to have pain.  REVIEW OF SYSTEMS:  CONSTITUTIONAL: No fever, fatigue or weakness.  EYES: No blurred or double vision.  EARS, NOSE, AND THROAT: No tinnitus or ear pain.  RESPIRATORY: No cough, shortness of breath, wheezing or hemoptysis.  CARDIOVASCULAR: No chest pain, orthopnea, edema.  GASTROINTESTINAL: No nausea, vomiting, diarrhea or abdominal pain.  GENITOURINARY: No dysuria, hematuria.  ENDOCRINE: No polyuria, nocturia,  HEMATOLOGY: No anemia, easy bruising or bleeding SKIN: No rash or lesion. MUSCULOSKELETAL: No joint pain or arthritis.   NEUROLOGIC: No tingling, numbness, weakness.  PSYCHIATRY: No anxiety or depression.   ROS  DRUG ALLERGIES:   Allergies  Allergen Reactions  . Peanut-Containing Drug Products Swelling    Swelling, rash,     VITALS:  Blood pressure (!) 160/67, pulse 69, temperature 97.7 F (36.5 C), temperature source Oral, resp. rate 18, height 5\' 10"  (1.778 m), weight 91.6 kg (202 lb), SpO2 98 %.  PHYSICAL EXAMINATION:  GENERAL:  71 y.o.-year-old patient lying in the bed with no acute distress.  EYES: Pupils equal, round, reactive to light and accommodation. No scleral icterus. Extraocular muscles intact.  HEENT: Head atraumatic, normocephalic. Oropharynx and nasopharynx clear.  NECK:  Supple, no jugular venous distention. No thyroid enlargement, no tenderness.  LUNGS: Normal breath sounds bilaterally, no wheezing, rales,rhonchi or crepitation. No use of accessory muscles of respiration.  CARDIOVASCULAR: S1, S2 normal. No murmurs, rubs, or gallops.  ABDOMEN: Soft,  RUQ tender, nondistended. Bowel sounds present. No organomegaly or mass.   EXTREMITIES: No pedal edema, cyanosis, or clubbing.  NEUROLOGIC: Cranial nerves II through XII are intact. Muscle strength 5/5 in all extremities. Sensation intact. Gait not checked.  PSYCHIATRIC: The patient is alert and oriented x 3.  SKIN: No obvious rash, lesion, or ulcer.   Physical Exam LABORATORY PANEL:   CBC  Recent Labs Lab 04/29/16 0806  WBC 8.2  HGB 14.0  HCT 40.4  PLT 202   ------------------------------------------------------------------------------------------------------------------  Chemistries   Recent Labs Lab 04/28/16 0433 04/29/16 0402  NA 136 134*  K 3.7 3.6  CL 102 102  CO2 27 26  GLUCOSE 170* 131*  BUN 20 14  CREATININE 0.85 0.95  CALCIUM 9.2 8.2*  MG 1.6*  --   AST 26 65*  ALT 24 48  ALKPHOS 61 78  BILITOT 0.8 0.9   ------------------------------------------------------------------------------------------------------------------  Cardiac Enzymes  Recent Labs Lab 04/28/16 0433  TROPONINI <0.03   ------------------------------------------------------------------------------------------------------------------  RADIOLOGY:  Dg Chest Portable 1 View  Result Date: 04/28/2016 CLINICAL DATA:  Chest pain.  History of heart attack. EXAM: PORTABLE CHEST 1 VIEW COMPARISON:  None. FINDINGS: Normal heart size and pulmonary vascularity. Mediastinal contours appear intact. Calcification of the aorta. No focal airspace disease or consolidation in the lungs. No blunting of costophrenic angles. No pneumothorax. Degenerative changes in the spine. IMPRESSION: No active disease. Electronically Signed   By: Lucienne Capers M.D.   On: 04/28/2016 05:03   US Abdomen Limited Ruq  Result Date: 04/28/2016 CLINICAL DATA:  Epigastric pain for 8 hours. EXAM: US ABDOMEN LIMITED - RIGHT UPPER QUADRANT COMPARISON:  None. FINDINGS: Gallbladder: Cholelithiasis with multiple stones in the gallbladder neck measuring  up to the 12 mm diameter. Layering sludge in the  gallbladder. No gallbladder wall thickening or edema. Murphy's sign is positive. Common bile duct: Diameter: 5.2 mm, normal Liver: No focal lesion identified. Within normal limits in parenchymal echogenicity. IMPRESSION: Cholelithiasis and gallbladder sludge with positive Murphy's sign. Changes are consistent with acute cholecystitis in the appropriate clinical setting. Electronically Signed   By: Lucienne Capers M.D.   On: 04/28/2016 05:48    ASSESSMENT AND PLAN:   Active Problems:   CAD, multiple vessel   Hyperlipidemia   Essential hypertension   S/P coronary artery stent placement   Hepatitis C   PTSD (post-traumatic stress disorder)   Acute calculous cholecystitis   Pre-operative cardiovascular examination   Medication management   * Acute cholecystitis   Broad spectrum ABx IV.   Appreciated surgery, planning to get percutaneous CholeCystostomy tube.   Hold anti platelets for now as per surgery.  * Hx of CAD   Seen by cardiologist and advised to hold plavix and proceed for sx.  * DM   Keep on ISS while NPO.  * Htn   Cont coreg, losartan and amlodipine.  * COPD   No wheezing, cont spiriva.    All the records are reviewed and case discussed with Care Management/Social Workerr. Management plans discussed with the patient, family and they are in agreement.  CODE STATUS: FUll  TOTAL TIME TAKING CARE OF THIS PATIENT: 35 minutes.     POSSIBLE D/C IN 1-2 DAYS, DEPENDING ON CLINICAL CONDITION.   Vaughan Basta M.D on 04/29/2016   Between 7am to 6pm - Pager - [phone]  After 6pm go to [email or web address] - password [email or web address] [facility]  Office  [phone]  CC: Primary care physician; Crecencio Mc, MD  Note: This dictation was prepared with Dragon dictation along with smaller phrase technology. Any transcriptional errors that result from this process are unintentional.

## 2016-04-29 NOTE — Plan of Care (Signed)
Problem: Health Behavior/Discharge Planning: Goal: Ability to manage health-related needs will improve Outcome: Progressing Educated the patient on the need for continuing NPO order till MD changes that order

## 2016-04-29 NOTE — Progress Notes (Signed)
CC: Cholecystitis Subjective: She now reports worsening abdominal pain this morning. No nausea no vomiting. White count trending down. I appreciate cardiology recommendations. Will hold any antiplatelets for now for potential cholecystostomy tube Wbc improving  Objective: Vital signs in last 24 hours: Temp:  [98.1 F (36.7 C)-98.6 F (37 C)] 98.6 F (37 C) (03/20 1148) Pulse Rate:  [70-72] 70 (03/20 1148) Resp:  [18-19] 18 (03/20 1148) BP: (129-173)/(61-74) 162/74 (03/20 1148) SpO2:  [99 %-100 %] 99 % (03/20 1148) Last BM Date: 04/27/16  Intake/Output from previous day: 03/19 0701 - 03/20 0700 In: 1563 [I.V.:1563] Out: 1500 [Urine:1500] Intake/Output this shift: Total I/O In: 800 [I.V.:750; IV Piggyback:50] Out: 750 [Urine:750]  Physical exam: NAD, awake, alert Abd: soft, TTP RUQ w + murphy. No peritonitis Ext: no edema, well perfused Lab Results: CBC   Recent Labs  04/28/16 0433 04/29/16 0806  WBC 13.3* 8.2  HGB 14.4 14.0  HCT 42.6 40.4  PLT 243 202   BMET  Recent Labs  04/28/16 0433 04/29/16 0402  NA 136 134*  K 3.7 3.6  CL 102 102  CO2 27 26  GLUCOSE 170* 131*  BUN 20 14  CREATININE 0.85 0.95  CALCIUM 9.2 8.2*   PT/INR No results for input(s): LABPROT, INR in the last 72 hours. ABG No results for input(s): PHART, HCO3 in the last 72 hours.  Invalid input(s): PCO2, PO2  Studies/Results: Dg Chest Portable 1 View  Result Date: 04/28/2016 CLINICAL DATA:  Chest pain.  History of heart attack. EXAM: PORTABLE CHEST 1 VIEW COMPARISON:  None. FINDINGS: Normal heart size and pulmonary vascularity. Mediastinal contours appear intact. Calcification of the aorta. No focal airspace disease or consolidation in the lungs. No blunting of costophrenic angles. No pneumothorax. Degenerative changes in the spine. IMPRESSION: No active disease. Electronically Signed   By: Lucienne Capers M.D.   On: 04/28/2016 05:03   US Abdomen Limited Ruq  Result Date:  04/28/2016 CLINICAL DATA:  Epigastric pain for 8 hours. EXAM: US ABDOMEN LIMITED - RIGHT UPPER QUADRANT COMPARISON:  None. FINDINGS: Gallbladder: Cholelithiasis with multiple stones in the gallbladder neck measuring up to the 12 mm diameter. Layering sludge in the gallbladder. No gallbladder wall thickening or edema. Murphy's sign is positive. Common bile duct: Diameter: 5.2 mm, normal Liver: No focal lesion identified. Within normal limits in parenchymal echogenicity. IMPRESSION: Cholelithiasis and gallbladder sludge with positive Murphy's sign. Changes are consistent with acute cholecystitis in the appropriate clinical setting. Electronically Signed   By: Lucienne Capers M.D.   On: 04/28/2016 05:48    Anti-infectives: Anti-infectives    Start     Dose/Rate Route Frequency Ordered Stop   04/28/16 1400  piperacillin-tazobactam (ZOSYN) IVPB 3.375 g     3.375 g 12.5 mL/hr over 240 Minutes Intravenous Every 8 hours 04/28/16 0751     04/28/16 0600  piperacillin-tazobactam (ZOSYN) IVPB 3.375 g     3.375 g 100 mL/hr over 30 Minutes Intravenous  Once 04/28/16 0557 04/28/16 0653      Assessment/Plan:Acute cholecystitis in need for cholecystostomy tube. Unfortunately only responded partially to antibiotic therapy and he is in need for a drainage procedure. We'll go ahead and put the order for cholecystostomy tube and continue to hold every antiplatelet therapy drug that he has some more. No evidence of acute coronary syndrome. No surgical intervention at this time.  Caroleen Hamman, MD, [facility]  04/29/2016

## 2016-04-30 ENCOUNTER — Other Ambulatory Visit: Payer: Medicare HMO

## 2016-04-30 ENCOUNTER — Other Ambulatory Visit: Payer: Self-pay

## 2016-04-30 ENCOUNTER — Other Ambulatory Visit (HOSPITAL_COMMUNITY): Payer: Self-pay

## 2016-04-30 ENCOUNTER — Inpatient Hospital Stay: Payer: Medicare HMO

## 2016-04-30 ENCOUNTER — Encounter: Payer: Self-pay | Admitting: Interventional Radiology

## 2016-04-30 HISTORY — PX: IR GENERIC HISTORICAL: IMG1180011

## 2016-04-30 LAB — GLUCOSE, CAPILLARY
GLUCOSE-CAPILLARY: 159 mg/dL — AB (ref 65–99)
GLUCOSE-CAPILLARY: 128 mg/dL — AB (ref 65–99)
Glucose-Capillary: 124 mg/dL — ABNORMAL HIGH (ref 65–99)
Glucose-Capillary: 122 mg/dL — ABNORMAL HIGH (ref 65–99)

## 2016-04-30 LAB — CBC
HCT: 39.9 % — ABNORMAL LOW (ref 40.0–52.0)
HEMOGLOBIN: 13.7 g/dL (ref 13.0–18.0)
MCH: 27.5 pg (ref 26.0–34.0)
MCHC: 34.5 g/dL (ref 32.0–36.0)
MCV: 79.7 fL — ABNORMAL LOW (ref 80.0–100.0)
PLATELETS: 199 10*3/uL (ref 150–440)
RBC: 5 MIL/uL (ref 4.40–5.90)
RDW: 15.5 % — ABNORMAL HIGH (ref 11.5–14.5)
WBC: 11.1 10*3/uL — ABNORMAL HIGH (ref 3.8–10.6)

## 2016-04-30 LAB — COMPREHENSIVE METABOLIC PANEL
ALBUMIN: 3.6 g/dL (ref 3.5–5.0)
ALK PHOS: 147 U/L — AB (ref 38–126)
ALT: 154 U/L — ABNORMAL HIGH (ref 17–63)
ANION GAP: 7 (ref 5–15)
AST: 127 U/L — ABNORMAL HIGH (ref 15–41)
BILIRUBIN TOTAL: 5.6 mg/dL — AB (ref 0.3–1.2)
BUN: 10 mg/dL (ref 6–20)
CALCIUM: 8.5 mg/dL — AB (ref 8.9–10.3)
CO2: 24 mmol/L (ref 22–32)
Chloride: 100 mmol/L — ABNORMAL LOW (ref 101–111)
Creatinine, Ser: 0.63 mg/dL (ref 0.61–1.24)
Glucose, Bld: 182 mg/dL — ABNORMAL HIGH (ref 65–99)
POTASSIUM: 3.6 mmol/L (ref 3.5–5.1)
Sodium: 131 mmol/L — ABNORMAL LOW (ref 135–145)
TOTAL PROTEIN: 7.3 g/dL (ref 6.5–8.1)

## 2016-04-30 LAB — PROTIME-INR
INR: 1.18
PROTHROMBIN TIME: 15.1 s (ref 11.4–15.2)

## 2016-04-30 MED ORDER — LIDOCAINE HCL (PF) 1 % IJ SOLN
INTRAMUSCULAR | Status: DC | PRN
  Administered 2016-04-30: 9 mL

## 2016-04-30 MED ORDER — MIDAZOLAM HCL 5 MG/5ML IJ SOLN
INTRAMUSCULAR | Status: AC
  Administered 2016-04-30: 12:00:00
  Filled 2016-04-30: qty 10

## 2016-04-30 MED ORDER — OXYCODONE-ACETAMINOPHEN 5-325 MG PO TABS
1.0000 | ORAL_TABLET | Freq: Four times a day (QID) | ORAL | Status: DC | PRN
  Administered 2016-04-30 – 2016-05-01 (×3): 1 via ORAL
  Filled 2016-04-30 (×3): qty 1

## 2016-04-30 MED ORDER — FENTANYL CITRATE (PF) 100 MCG/2ML IJ SOLN
INTRAMUSCULAR | Status: AC | PRN
  Administered 2016-04-30: 50 ug via INTRAVENOUS
  Administered 2016-04-30: 25 ug via INTRAVENOUS

## 2016-04-30 MED ORDER — FENTANYL CITRATE (PF) 100 MCG/2ML IJ SOLN
INTRAMUSCULAR | Status: AC
  Administered 2016-04-30: 15:00:00
  Filled 2016-04-30: qty 4

## 2016-04-30 MED ORDER — MORPHINE SULFATE (PF) 2 MG/ML IV SOLN
INTRAVENOUS | Status: AC
  Administered 2016-04-30: 2 mg via INTRAVENOUS
  Filled 2016-04-30: qty 1

## 2016-04-30 MED ORDER — ONDANSETRON HCL 4 MG/2ML IJ SOLN
INTRAMUSCULAR | Status: AC
  Administered 2016-04-30: 13:00:00
  Filled 2016-04-30: qty 2

## 2016-04-30 MED ORDER — ASPIRIN 81 MG PO CHEW
81.0000 mg | CHEWABLE_TABLET | Freq: Every day | ORAL | Status: DC
  Administered 2016-04-30 – 2016-05-02 (×3): 81 mg via ORAL
  Filled 2016-04-30 (×3): qty 1

## 2016-04-30 MED ORDER — MIDAZOLAM HCL 5 MG/5ML IJ SOLN
INTRAMUSCULAR | Status: AC | PRN
  Administered 2016-04-30 (×2): 1 mg via INTRAVENOUS
  Administered 2016-04-30: 0.5 mg via INTRAVENOUS

## 2016-04-30 MED ORDER — IOPAMIDOL (ISOVUE-300) INJECTION 61%
50.0000 mL | Freq: Once | INTRAVENOUS | Status: AC | PRN
  Administered 2016-04-30: 10 mL

## 2016-04-30 MED ORDER — HEPARIN SODIUM (PORCINE) 5000 UNIT/ML IJ SOLN
5000.0000 [IU] | Freq: Three times a day (TID) | INTRAMUSCULAR | Status: DC
  Administered 2016-04-30 – 2016-05-02 (×6): 5000 [IU] via SUBCUTANEOUS
  Filled 2016-04-30 (×6): qty 1

## 2016-04-30 MED ORDER — LIDOCAINE HCL (PF) 1 % IJ SOLN
INTRAMUSCULAR | Status: AC
  Filled 2016-04-30: qty 30

## 2016-04-30 NOTE — Progress Notes (Signed)
Consult order received to resume any interrupted anticoagulants/antiplatelets. Verified with hospitalist - restart aspirin and subcutaneous heparin. No order received to resume clopidogrel.  Padme Arriaga A. [place], [place].D., BCPS Clinical Pharmacist 04/30/2016 1413

## 2016-04-30 NOTE — Progress Notes (Signed)
[facility] at [place] NAME: Ricky Abbott    MR#:  [id number]  DATE OF BIRTH:  1945-04-23  SUBJECTIVE:  Patient was seen in specialty recovery. He is status post cholecystostomy tube placement by interventional radiology. Denies any complaints at present.  REVIEW OF SYSTEMS:   Review of Systems  Constitutional: Negative for chills, fever and weight loss.  HENT: Negative for ear discharge, ear pain and nosebleeds.   Eyes: Negative for blurred vision, pain and discharge.  Respiratory: Negative for sputum production, shortness of breath, wheezing and stridor.   Cardiovascular: Negative for chest pain, palpitations, orthopnea and PND.  Gastrointestinal: Negative for diarrhea, nausea and vomiting.  Genitourinary: Negative for frequency and urgency.  Musculoskeletal: Negative for back pain and joint pain.  Neurological: Positive for weakness. Negative for sensory change, speech change and focal weakness.  Psychiatric/Behavioral: Negative for depression and hallucinations. The patient is not nervous/anxious.    Tolerating Diet: Tolerating PT:   DRUG ALLERGIES:   Allergies  Allergen Reactions  . Peanut-Containing Drug Products Swelling    Swelling, rash,     VITALS:  Blood pressure (!) 105/55, pulse 63, temperature 97.7 F (36.5 C), temperature source Oral, resp. rate 13, height 5\' 10"  (1.778 m), weight 91.6 kg (202 lb), SpO2 91 %.  PHYSICAL EXAMINATION:   Physical Exam  GENERAL:  71 y.o.-year-old patient lying in the bed with no acute distress.  EYES: Pupils equal, round, reactive to light and accommodation. No scleral icterus. Extraocular muscles intact.  HEENT: Head atraumatic, normocephalic. Oropharynx and nasopharynx clear.  NECK:  Supple, no jugular venous distention. No thyroid enlargement, no tenderness.  LUNGS: Normal breath sounds bilaterally, no wheezing, rales, rhonchi. No use of accessory muscles of respiration.   CARDIOVASCULAR: S1, S2 normal. No murmurs, rubs, or gallops.  ABDOMEN: Soft, nontender, nondistended. Bowel sounds present. No organomegaly or mass. Right upper quadrant cholecystostomy tube present EXTREMITIES: No cyanosis, clubbing or edema b/l.    NEUROLOGIC: Cranial nerves II through XII are intact. No focal Motor or sensory deficits b/l.   PSYCHIATRIC:  patient is alert but somewhat groggy secondary to sedation SKIN: No obvious rash, lesion, or ulcer.   LABORATORY PANEL:  CBC  Recent Labs Lab 04/30/16 0403  WBC 11.1*  HGB 13.7  HCT 39.9*  PLT 199    Chemistries   Recent Labs Lab 04/28/16 0433  04/30/16 0403  NA 136  < > 131*  K 3.7  < > 3.6  CL 102  < > 100*  CO2 27  < > 24  GLUCOSE 170*  < > 182*  BUN 20  < > 10  CREATININE 0.85  < > 0.63  CALCIUM 9.2  < > 8.5*  MG 1.6*  --   --   AST 26  < > 127*  ALT 24  < > 154*  ALKPHOS 61  < > 147*  BILITOT 0.8  < > 5.6*  < > = values in this interval not displayed. Cardiac Enzymes  Recent Labs Lab 04/28/16 0433  TROPONINI <0.03   RADIOLOGY:  Ir Perc Cholecystostomy  Result Date: 04/30/2016 CLINICAL DATA:  Acute calculus cholecystitis, epigastric pain, coronary artery disease. EXAM: PERCUTANEOUS CHOLECYSTOSTOMY TUBE PLACEMENT WITH ULTRASOUND AND FLUOROSCOPIC GUIDANCE FLUOROSCOPY TIME:  0.7 minutes (263 mGym2 DAP) TECHNIQUE: The procedure, risks (including but not limited to bleeding, infection, organ damage ), benefits, and alternatives were explained to the patient. Questions regarding the procedure were encouraged and answered. The patient  understands and consents to the procedure. Survey ultrasound of the abdomen was performed and an appropriate skin entry site was identified. Skin site was marked, prepped with chlorhexidine, and draped in usual sterile fashion, and infiltrated locally with 1% lidocaine. Intravenous Fentanyl and Versed were administered as conscious sedation during continuous monitoring of the  patient's level of consciousness and physiological / cardiorespiratory status by the radiology RN, with a total moderate sedation time of 14 minutes. Under real-time ultrasound guidance, gallbladder was accessed using a transhepatic approach with a 21-gauge needle. Ultrasound image documentation was saved. Bile returned through the hub. Needle was exchanged over a 018 guidewire for transitional dilator which allowed placement of 035 J wire. Over this, a 10.2 French pigtail catheter was advanced and formed centrally in the gallbladder lumen. Approximately 90 mL of purulent bile were aspirated. Small contrast injection confirmed appropriate position. Catheter secured externally with 0 Prolene suture and placed external drain bag. Patient tolerated the procedure well. COMPLICATIONS: COMPLICATIONS none IMPRESSION: 1. Technically successful percutaneous cholecystostomy tube placement with ultrasound and fluoroscopic guidance. Electronically Signed   By: Lucrezia Europe M.D.   On: 04/30/2016 13:39   ASSESSMENT AND PLAN:   Ricky Abbott  is a 71 y.o. male with a known history of Coronary artery disease, diabetes mellitus type 2, hypertension, hyperlipidemia, posttraumatic stress disorder, sarcoidosis presented to the emergency room with abdominal pain since last night 9 PM. The pain is located in the epigastrium and right upper quadrant  * Acute cholecystitis   Broad spectrum ABx IV.   Appreciated surgery input -s/p percutaneous CholeCystostomy tube by interventional radiology on 04/30/2016   okay to resume aspirin and heparin by IR Follow-up fluid culture results  * Hx of CAD   Seen by cardiologist and advised to hold plavix and proceed for sx.  * DM   Keep on ISS while NPO.  * Htn   Cont coreg, losartan and amlodipine.  * COPD   No wheezing, cont spiriva.  physical therapy to see patient Case discussed with Care Management/Social Worker. Management plans discussed with the patient, family  and they are in agreement.  CODE STATUS: full  DVT Prophylaxis:heparin  TOTAL TIME TAKING CARE OF THIS PATIENT:30 minutes.  >50% time spent on counselling and coordination of care  POSSIBLE D/C IN 1-2 DAYS, DEPENDING ON CLINICAL CONDITION.  Note: This dictation was prepared with Dragon dictation along with smaller phrase technology. Any transcriptional errors that result from this process are unintentional.  Avacyn Kloosterman M.D on 04/30/2016 at 2:55 PM  Between 7am to 6pm - Pager - [phone]  After 6pm go to [email or web address] - password [email or web address] [facility]  Office  [phone]  CC: Primary care physician; Crecencio Mc, MD

## 2016-04-30 NOTE — Progress Notes (Signed)
CC: cholecystitis Subjective: Chole tube today Inc LFT Persistent pain  afebrile  Objective: Vital signs in last 24 hours: Temp:  [97.7 F (36.5 C)-98.6 F (37 C)] 97.7 F (36.5 C) (03/21 1129) Pulse Rate:  [57-73] 57 (03/21 1129) Resp:  [15-18] 15 (03/21 1129) BP: (150-160)/(59-72) 156/72 (03/21 1129) SpO2:  [96 %-98 %] 97 % (03/21 1129) Weight:  [91.6 kg (202 lb)] 91.6 kg (202 lb) (03/21 1129) Last BM Date: 04/27/16  Intake/Output from previous day: 03/20 0701 - 03/21 0700 In: 2243 [I.V.:2193; IV Piggyback:50] Out: 2125 [Urine:2125] Intake/Output this shift: Total I/O In: -  Out: 800 [Urine:800]  Physical exam: NAD awake and alert Abd: soft TTP RUQ, no peritonitis Ext: no edema  Lab Results: CBC   Recent Labs  04/29/16 0806 04/30/16 0403  WBC 8.2 11.1*  HGB 14.0 13.7  HCT 40.4 39.9*  PLT 202 199   BMET  Recent Labs  04/29/16 0402 04/30/16 0403  NA 134* 131*  K 3.6 3.6  CL 102 100*  CO2 26 24  GLUCOSE 131* 182*  BUN 14 10  CREATININE 0.95 0.63  CALCIUM 8.2* 8.5*   PT/INR  Recent Labs  04/30/16 0403  LABPROT 15.1  INR 1.18   ABG No results for input(s): PHART, HCO3 in the last 72 hours.  Invalid input(s): PCO2, PO2  Studies/Results: No results found.  Anti-infectives: Anti-infectives    Start     Dose/Rate Route Frequency Ordered Stop   04/28/16 1400  piperacillin-tazobactam (ZOSYN) IVPB 3.375 g     3.375 g 12.5 mL/hr over 240 Minutes Intravenous Every 8 hours 04/28/16 0751     04/28/16 0600  piperacillin-tazobactam (ZOSYN) IVPB 3.375 g     3.375 g 100 mL/hr over 30 Minutes Intravenous  Once 04/28/16 0557 04/28/16 8144      Assessment/Plan: Chole tube today MRCP to r/o CBD stone, Dr. Allen Norris not available, if positive will need to transfer for ERCP NO evidence of cholangitis or peritonitis that merit emergent surgical intervention    Caroleen Hamman, MD, FACS  04/30/2016

## 2016-04-30 NOTE — Procedures (Signed)
Perc cholecystostomy tube placed under [place] and fluoro No complication No blood loss. See complete dictation in [facility].

## 2016-04-30 NOTE — Care Management Important Message (Signed)
Important Message  Patient Details  Name: Ricky Abbott MRN: [id number] Date of Birth: 1945/07/17   Medicare Important Message Given:  Yes    Beverly Sessions, RN 04/30/2016, 1:09 PM

## 2016-05-01 ENCOUNTER — Inpatient Hospital Stay: Payer: Medicare HMO

## 2016-05-01 ENCOUNTER — Telehealth: Payer: Self-pay | Admitting: Internal Medicine

## 2016-05-01 LAB — COMPREHENSIVE METABOLIC PANEL
ALT: 94 U/L — AB (ref 17–63)
AST: 51 U/L — ABNORMAL HIGH (ref 15–41)
Albumin: 3 g/dL — ABNORMAL LOW (ref 3.5–5.0)
Alkaline Phosphatase: 113 U/L (ref 38–126)
Anion gap: 5 (ref 5–15)
BUN: 19 mg/dL (ref 6–20)
CHLORIDE: 100 mmol/L — AB (ref 101–111)
CO2: 25 mmol/L (ref 22–32)
Calcium: 7.9 mg/dL — ABNORMAL LOW (ref 8.9–10.3)
Creatinine, Ser: 1.34 mg/dL — ABNORMAL HIGH (ref 0.61–1.24)
GFR calc Af Amer: >60 mL/min (ref >60)
GFR calc non Af Amer: 52 mL/min — ABNORMAL LOW (ref >60)
GLUCOSE: 110 mg/dL — AB (ref 65–99)
Potassium: 3.1 mmol/L — ABNORMAL LOW (ref 3.5–5.1)
SODIUM: 130 mmol/L — AB (ref 135–145)
Total Bilirubin: 2.1 mg/dL — ABNORMAL HIGH (ref 0.3–1.2)
Total Protein: 6.1 g/dL — ABNORMAL LOW (ref 6.5–8.1)

## 2016-05-01 LAB — CBC
HEMATOCRIT: 36.5 % — AB (ref 40.0–52.0)
Hemoglobin: 12.4 g/dL — ABNORMAL LOW (ref 13.0–18.0)
MCH: 27.3 pg (ref 26.0–34.0)
MCHC: 33.8 g/dL (ref 32.0–36.0)
MCV: 80.8 fL (ref 80.0–100.0)
Platelets: 173 10*3/uL (ref 150–440)
RBC: 4.52 MIL/uL (ref 4.40–5.90)
RDW: 16 % — AB (ref 11.5–14.5)
WBC: 8.4 10*3/uL (ref 3.8–10.6)

## 2016-05-01 LAB — GLUCOSE, CAPILLARY
GLUCOSE-CAPILLARY: 135 mg/dL — AB (ref 65–99)
GLUCOSE-CAPILLARY: 92 mg/dL (ref 65–99)
Glucose-Capillary: 127 mg/dL — ABNORMAL HIGH (ref 65–99)
Glucose-Capillary: 134 mg/dL — ABNORMAL HIGH (ref 65–99)

## 2016-05-01 MED ORDER — GADOBENATE DIMEGLUMINE 529 MG/ML IV SOLN
20.0000 mL | Freq: Once | INTRAVENOUS | Status: AC | PRN
  Administered 2016-05-01: 19 mL via INTRAVENOUS

## 2016-05-01 MED ORDER — SODIUM CHLORIDE 0.9 % IV SOLN
INTRAVENOUS | Status: DC
  Administered 2016-05-01: 11:00:00 via INTRAVENOUS

## 2016-05-01 MED ORDER — LOSARTAN POTASSIUM 50 MG PO TABS
50.0000 mg | ORAL_TABLET | Freq: Every day | ORAL | Status: DC
  Administered 2016-05-01 – 2016-05-02 (×2): 50 mg via ORAL
  Filled 2016-05-01 (×2): qty 1

## 2016-05-01 MED ORDER — SERTRALINE HCL 100 MG PO TABS
100.0000 mg | ORAL_TABLET | Freq: Every day | ORAL | Status: DC
  Filled 2016-05-01: qty 1

## 2016-05-01 MED ORDER — LORAZEPAM 1 MG PO TABS
1.0000 mg | ORAL_TABLET | Freq: Every day | ORAL | Status: DC
  Administered 2016-05-01: 1 mg via ORAL
  Filled 2016-05-01: qty 1

## 2016-05-01 MED ORDER — NITROGLYCERIN 0.4 MG SL SUBL: 0.4000 mg | SUBLINGUAL_TABLET | SUBLINGUAL | Status: DC | PRN

## 2016-05-01 MED ORDER — AMLODIPINE BESYLATE 10 MG PO TABS
10.0000 mg | ORAL_TABLET | Freq: Every day | ORAL | Status: DC
  Administered 2016-05-01 – 2016-05-02 (×2): 10 mg via ORAL
  Filled 2016-05-01 (×3): qty 1

## 2016-05-01 MED ORDER — SODIUM CHLORIDE 0.9 % IV SOLN
30.0000 meq | Freq: Once | INTRAVENOUS | Status: AC
  Administered 2016-05-01: 30 meq via INTRAVENOUS
  Filled 2016-05-01: qty 15

## 2016-05-01 MED ORDER — PANTOPRAZOLE SODIUM 40 MG PO TBEC
40.0000 mg | DELAYED_RELEASE_TABLET | Freq: Every day | ORAL | Status: DC
  Administered 2016-05-01 – 2016-05-02 (×2): 40 mg via ORAL
  Filled 2016-05-01 (×2): qty 1

## 2016-05-01 MED ORDER — METRONIDAZOLE 50 MG/ML ORAL SUSPENSION: 500.0000 mg | Freq: Three times a day (TID) | ORAL | Status: DC

## 2016-05-01 MED ORDER — CIPROFLOXACIN HCL 500 MG PO TABS
500.0000 mg | ORAL_TABLET | Freq: Two times a day (BID) | ORAL | Status: DC
  Administered 2016-05-01 – 2016-05-02 (×3): 500 mg via ORAL
  Filled 2016-05-01 (×3): qty 1

## 2016-05-01 MED ORDER — METRONIDAZOLE 500 MG PO TABS
500.0000 mg | ORAL_TABLET | Freq: Three times a day (TID) | ORAL | Status: DC
  Administered 2016-05-01 – 2016-05-02 (×3): 500 mg via ORAL
  Filled 2016-05-01 (×3): qty 1

## 2016-05-01 NOTE — Telephone Encounter (Signed)
Spoke with pt and informed him that this medication has been removed from his list.

## 2016-05-01 NOTE — Progress Notes (Signed)
CC: cholecystitis Subjective: Doing better, MRCP revealed no CBD stone and lft are better, Taking PO  Objective: Vital signs in last 24 hours: Temp:  [97.4 F (36.3 C)-98.6 F (37 C)] 98.4 F (36.9 C) (03/22 0826) Pulse Rate:  [60-84] 64 (03/22 0826) Resp:  [11-20] 18 (03/22 0442) BP: (86-172)/(44-91) 97/50 (03/22 0442) SpO2:  [91 %-99 %] 96 % (03/22 0442) Last BM Date: 04/27/16  Intake/Output from previous day: 03/21 0701 - 03/22 0700 In: 243 [P.O.:240; I.V.:3] Out: 2183 [Urine:2050; Drains:133] Intake/Output this shift: Total I/O In: 2516.3 [I.V.:2261.3; Other:5; IV Piggyback:250] Out: 0   Physical exam: NAD Abd: soft, NT , no peritonitis Ext: well perfused and no edema  Lab Results: CBC   Recent Labs  04/30/16 0403 05/01/16 0411  WBC 11.1* 8.4  HGB 13.7 12.4*  HCT 39.9* 36.5*  PLT 199 173   BMET  Recent Labs  04/30/16 0403 05/01/16 0411  NA 131* 130*  K 3.6 3.1*  CL 100* 100*  CO2 24 25  GLUCOSE 182* 110*  BUN 10 19  CREATININE 0.63 1.34*  CALCIUM 8.5* 7.9*   PT/INR  Recent Labs  04/30/16 0403  LABPROT 15.1  INR 1.18   ABG No results for input(s): PHART, HCO3 in the last 72 hours.  Invalid input(s): PCO2, PO2  Studies/Results: Ir Perc Cholecystostomy  Result Date: 04/30/2016 CLINICAL DATA:  Acute calculus cholecystitis, epigastric pain, coronary artery disease. EXAM: PERCUTANEOUS CHOLECYSTOSTOMY TUBE PLACEMENT WITH ULTRASOUND AND FLUOROSCOPIC GUIDANCE FLUOROSCOPY TIME:  0.7 minutes (263 mGym2 DAP) TECHNIQUE: The procedure, risks (including but not limited to bleeding, infection, organ damage ), benefits, and alternatives were explained to the patient. Questions regarding the procedure were encouraged and answered. The patient understands and consents to the procedure. Survey ultrasound of the abdomen was performed and an appropriate skin entry site was identified. Skin site was marked, prepped with chlorhexidine, and draped in usual sterile  fashion, and infiltrated locally with 1% lidocaine. Intravenous Fentanyl and Versed were administered as conscious sedation during continuous monitoring of the patient's level of consciousness and physiological / cardiorespiratory status by the radiology RN, with a total moderate sedation time of 14 minutes. Under real-time ultrasound guidance, gallbladder was accessed using a transhepatic approach with a 21-gauge needle. Ultrasound image documentation was saved. Bile returned through the hub. Needle was exchanged over a 018 guidewire for transitional dilator which allowed placement of 035 J wire. Over this, a 10.2 French pigtail catheter was advanced and formed centrally in the gallbladder lumen. Approximately 90 mL of purulent bile were aspirated. Small contrast injection confirmed appropriate position. Catheter secured externally with 0 Prolene suture and placed external drain bag. Patient tolerated the procedure well. COMPLICATIONS: COMPLICATIONS none IMPRESSION: 1. Technically successful percutaneous cholecystostomy tube placement with ultrasound and fluoroscopic guidance. Electronically Signed   By: Lucrezia Europe M.D.   On: 04/30/2016 13:39   Mr Abdomen Mrcp Moise Boring Contast  Result Date: 05/01/2016 CLINICAL DATA:  71 year old male with history of abdominal pain yesterday evening, found to have evidence of acute cholecystitis on abdominal ultrasound. EXAM: MRI ABDOMEN WITHOUT AND WITH CONTRAST (INCLUDING MRCP) TECHNIQUE: Multiplanar multisequence MR imaging of the abdomen was performed both before and after the administration of intravenous contrast. Heavily T2-weighted images of the biliary and pancreatic ducts were obtained, and three-dimensional MRCP images were rendered by post processing. CONTRAST:  5mL MULTIHANCE GADOBENATE DIMEGLUMINE 529 MG/ML IV SOLN COMPARISON:  No priors. FINDINGS: Lower chest: Unremarkable. Hepatobiliary: No cystic or solid hepatic lesions. MRCP images demonstrate  no intra or  extrahepatic biliary ductal dilatation. Common bile duct measures 5 mm in the porta hepatis. No filling defect in the common bile duct to suggest choledocholithiasis. Multiple small filling defects within the gallbladder, compatible with biliary sludge. Gallbladder wall appears mildly thickened measuring up to 9 mm, and there is a small amount of pericholecystic fluid. Gallbladder appears only moderately distended. Pancreas: No pancreatic mass. No pancreatic ductal dilatation on MRCP images. No pancreatic or peripancreatic fluid or inflammatory changes. Spleen:  Unremarkable. Adrenals/Urinary Tract: Bilateral kidneys and bilateral adrenal glands are normal in appearance. There is no hydroureteronephrosis in the visualized abdomen. Stomach/Bowel: Visualized portions are unremarkable. Vascular/Lymphatic: Aortic atherosclerosis, without evidence of aneurysm in the visualized abdominal vasculature. No lymphadenopathy noted in the abdomen. Other: No significant volume of ascites noted in the visualized portions of the peritoneal cavity. Musculoskeletal: No aggressive osseous lesions are identified in the visualized portions of the skeleton. IMPRESSION: 1. Cholelithiasis with findings suggestive of acute cholecystitis, as previously demonstrated on the ultrasound examination. 2. No evidence of choledocholithiasis or findings to suggest biliary tract obstruction. 3. No other acute findings noted elsewhere in the abdomen. 4. Aortic atherosclerosis. Electronically Signed   By: Vinnie Langton M.D.   On: 05/01/2016 10:21    Anti-infectives: Anti-infectives    Start     Dose/Rate Route Frequency Ordered Stop   04/28/16 1400  piperacillin-tazobactam (ZOSYN) IVPB 3.375 g     3.375 g 12.5 mL/hr over 240 Minutes Intravenous Every 8 hours 04/28/16 0751     04/28/16 0600  piperacillin-tazobactam (ZOSYN) IVPB 3.375 g     3.375 g 100 mL/hr over 30 Minutes Intravenous  Once 04/28/16 0557 04/28/16 7741       Assessment/Plan: Acute cholecystitis improving w chole tube Continue A/Bs Replace lytes NO evidence of CBD stones and LFT improving He will need interval cholecystectomy at some point in time No need for emergent intervention   Caroleen Hamman, MD, FACS  05/01/2016

## 2016-05-01 NOTE — Telephone Encounter (Signed)
Pt called and stated that he has no been taking Tiotropium Bromide Monohydrate (SPIRIVA RESPIMAT) 2.5 MCG/ACT AERS because it was to expensive. Can we please remove this from his medication list. Pt is currently in the hospital and they keep trying to give it to him. Please advise, thank you! Call pt @ [phone]

## 2016-05-01 NOTE — Evaluation (Signed)
Physical Therapy Evaluation Patient Details Name: Ricky Abbott MRN: [id number] DOB: 01-15-1946 Today's Date: 05/01/2016   History of Present Illness  Pt presented to the ED with abdominal pain and nausea/vomitting. Pt admitted for acute cholecystitis on 3/19 with cholecystostomy tube placed on 3/21. Pt has PMH of CAD, HLD, HTN, Hep C, PTSD, DM, sarcoidosis, COPD, s/p coronary artery stent, s/p cardiac cath.  Clinical Impression  Pt was independent with all transfers and ambulation and pt reports that he is performing at baseline functional level. Pt had no loss of balance during PT session. Recommend no further PT follow up at this time. Will complete PT order. Please refer pt back to PT if any change in pt status or acute PT needs are identified.    Follow Up Recommendations No PT follow up    Equipment Recommendations  None recommended by PT    Recommendations for Other Services       Precautions / Restrictions Precautions Precautions: None      Mobility  Bed Mobility               General bed mobility comments: Not assessed due to pt sitting in chair prior to session and pt wished to return to chair after PT session.  Transfers Overall transfer level: Independent (Privided CGA for safetly, but pt steady without loss of balance.) Equipment used: None                Ambulation/Gait Ambulation/Gait assistance: Independent Ambulation Distance (Feet): 360 Feet Assistive device: None Gait Pattern/deviations: WFL(Within Functional Limits)   Gait velocity interpretation: at or above normal speed for age/gender General Gait Details: Pt. independent with ambulation; PT provided CGA for safety, but pt. was steady and did not present with any gait deviations. Pt reported mild burning in his legs at the end of ambulating, but said that this occurs at baseline and pt has dicussed this with his primary care doctor.  Stairs            Wheelchair Mobility     Modified Rankin (Stroke Patients Only)       Balance Overall balance assessment: Independent  During ambulation pt performed head turns R, L, and down (pt has hx of dizziness with cervical extension, so did not assess pt with looking up) without any loss of balance; pt also increased and decreased speed during ambulation without loss of balance.                                          Pertinent Vitals/Pain Pain Assessment: 0-10 Pain Score: 8  Pain Location: Cholecystostomy tube site on R side of trunk. Pain Descriptors / Indicators: Constant;Sore Pain Intervention(s): Premedicated before session (Pt reported RN gave him pain meds prior to PT session.)  Pt BP in sitting prior to standing with PT was 111/61 and HR and O2 sats remained WFL before and after session; pt did not report any dizziness throughout session until he returned to the chair and pt reported that he thought it was due to the pain medicine.     Home Living Family/patient expects to be discharged to:: Private residence Living Arrangements: Alone   Type of Home: Apartment Home Access: Level entry     Home Layout: One level Home Equipment: None      Prior Function Level of Independence: Independent  Hand Dominance        Extremity/Trunk Assessment   Upper Extremity Assessment Upper Extremity Assessment: Overall WFL for tasks assessed (Pt. AROM and grip stregth assessed and WFL. )    Lower Extremity Assessment Lower Extremity Assessment: Overall WFL for tasks assessed (Pt. AROM and LE MMTs assessed and WFL.)    Cervical / Trunk Assessment Cervical / Trunk Assessment: Normal  Communication   Communication: No difficulties  Cognition Arousal/Alertness: Awake/alert Behavior During Therapy: WFL for tasks assessed/performed Overall Cognitive Status: Within Functional Limits for tasks assessed                      General Comments General comments (skin  integrity, edema, etc.):   Exercises     Assessment/Plan    PT Assessment Patent does not need any further PT services  PT Problem List         PT Treatment Interventions      PT Goals (Current goals can be found in the Care Plan section)  Acute Rehab PT Goals Patient Stated Goal: to go home and return to normal routine. PT Goal Formulation: All assessment and education complete, DC therapy Time For Goal Achievement: 05/01/16 Potential to Achieve Goals: Good    Frequency     Barriers to discharge        Co-evaluation               End of Session Equipment Utilized During Treatment: Gait belt Activity Tolerance: Patient tolerated treatment well;No increased pain Patient left: in chair;with call bell/phone within reach;with nursing/sitter in room (Nurse tech came in to take blood sugar at end of PT session.)   PT Visit Diagnosis: Difficulty in walking, not elsewhere classified (R26.2)         Time: 4975-3005 PT Time Calculation (min) (ACUTE ONLY): 13 min   Charges:         PT G Codes:         Ricky Abbott, SPT 05/01/2016, 2:39 PM

## 2016-05-01 NOTE — Progress Notes (Signed)
[facility] [place] NAME: Ricky Abbott    MR#:  [id number]  DATE OF BIRTH:  [birth year]  SUBJECTIVE:  Sitting out in the chair for complaints once some regular food He is status post cholecystostomy tube placement by interventional radiology. Denies any complaints at present.  REVIEW OF SYSTEMS:   Review of Systems  Constitutional: Negative for chills, fever and weight loss.  HENT: Negative for ear discharge, ear pain and nosebleeds.   Eyes: Negative for blurred vision, pain and discharge.  Respiratory: Negative for sputum production, shortness of breath, wheezing and stridor.   Cardiovascular: Negative for chest pain, palpitations, orthopnea and PND.  Gastrointestinal: Negative for diarrhea, nausea and vomiting.  Genitourinary: Negative for frequency and urgency.  Musculoskeletal: Negative for back pain and joint pain.  Neurological: Positive for weakness. Negative for sensory change, speech change and focal weakness.  Psychiatric/Behavioral: Negative for depression and hallucinations. The patient is not nervous/anxious.    Tolerating Diet:Clear liquid Tolerating PT: Pending  DRUG ALLERGIES:   Allergies  Allergen Reactions  . Peanut-Containing Drug Products Swelling    Swelling, rash,     VITALS:  Blood pressure (!) 100/46, pulse (!) 59, temperature 98.2 F (36.8 C), temperature source Oral, resp. rate 16, height 5\' 10"  (1.778 m), weight 91.6 kg (202 lb), SpO2 95 %.  PHYSICAL EXAMINATION:   Physical Exam  GENERAL:  71 y.o.-year-old patient lying in the bed with no acute distress.  EYES: Pupils equal, round, reactive to light and accommodation. No scleral icterus. Extraocular muscles intact.  HEENT: Head atraumatic, normocephalic. Oropharynx and nasopharynx clear.  NECK:  Supple, no jugular venous distention. No thyroid enlargement, no tenderness.  LUNGS: Normal breath sounds bilaterally, no wheezing, rales, rhonchi. No  use of accessory muscles of respiration.  CARDIOVASCULAR: S1, S2 normal. No murmurs, rubs, or gallops.  ABDOMEN: Soft, nontender, nondistended. Bowel sounds present. No organomegaly or mass. Right upper quadrant cholecystostomy tube present EXTREMITIES: No cyanosis, clubbing or edema b/l.    NEUROLOGIC: Cranial nerves II through XII are intact. No focal Motor or sensory deficits b/l.   PSYCHIATRIC:  patient is alert but somewhat groggy secondary to sedation SKIN: No obvious rash, lesion, or ulcer.   LABORATORY PANEL:  CBC  Recent Labs Lab 05/01/16 0411  WBC 8.4  HGB 12.4*  HCT 36.5*  PLT 173    Chemistries   Recent Labs Lab 04/28/16 0433  05/01/16 0411  NA 136  < > 130*  K 3.7  < > 3.1*  CL 102  < > 100*  CO2 27  < > 25  GLUCOSE 170*  < > 110*  BUN 20  < > 19  CREATININE 0.85  < > 1.34*  CALCIUM 9.2  < > 7.9*  MG 1.6*  --   --   AST 26  < > 51*  ALT 24  < > 94*  ALKPHOS 61  < > 113  BILITOT 0.8  < > 2.1*  < > = values in this interval not displayed. Cardiac Enzymes  Recent Labs Lab 04/28/16 0433  TROPONINI <0.03   RADIOLOGY:  Ir Perc Cholecystostomy  Result Date: 04/30/2016 CLINICAL DATA:  Acute calculus cholecystitis, epigastric pain, coronary artery disease. EXAM: PERCUTANEOUS CHOLECYSTOSTOMY TUBE PLACEMENT WITH ULTRASOUND AND FLUOROSCOPIC GUIDANCE FLUOROSCOPY TIME:  0.7 minutes (263 mGym2 DAP) TECHNIQUE: The procedure, risks (including but not limited to bleeding, infection, organ damage ), benefits, and alternatives were explained to the patient. Questions regarding the  procedure were encouraged and answered. The patient understands and consents to the procedure. Survey ultrasound of the abdomen was performed and an appropriate skin entry site was identified. Skin site was marked, prepped with chlorhexidine, and draped in usual sterile fashion, and infiltrated locally with 1% lidocaine. Intravenous Fentanyl and Versed were administered as conscious sedation  during continuous monitoring of the patient's level of consciousness and physiological / cardiorespiratory status by the radiology RN, with a total moderate sedation time of 14 minutes. Under real-time ultrasound guidance, gallbladder was accessed using a transhepatic approach with a 21-gauge needle. Ultrasound image documentation was saved. Bile returned through the hub. Needle was exchanged over a 018 guidewire for transitional dilator which allowed placement of 035 J wire. Over this, a 10.2 French pigtail catheter was advanced and formed centrally in the gallbladder lumen. Approximately 90 mL of purulent bile were aspirated. Small contrast injection confirmed appropriate position. Catheter secured externally with 0 Prolene suture and placed external drain bag. Patient tolerated the procedure well. COMPLICATIONS: COMPLICATIONS none IMPRESSION: 1. Technically successful percutaneous cholecystostomy tube placement with ultrasound and fluoroscopic guidance. Electronically Signed   By: Lucrezia Europe M.D.   On: 04/30/2016 13:39   Mr Abdomen Mrcp Moise Boring Contast  Result Date: 05/01/2016 CLINICAL DATA:  71 year old male with history of abdominal pain yesterday evening, found to have evidence of acute cholecystitis on abdominal ultrasound. EXAM: MRI ABDOMEN WITHOUT AND WITH CONTRAST (INCLUDING MRCP) TECHNIQUE: Multiplanar multisequence MR imaging of the abdomen was performed both before and after the administration of intravenous contrast. Heavily T2-weighted images of the biliary and pancreatic ducts were obtained, and three-dimensional MRCP images were rendered by post processing. CONTRAST:  8mL MULTIHANCE GADOBENATE DIMEGLUMINE 529 MG/ML IV SOLN COMPARISON:  No priors. FINDINGS: Lower chest: Unremarkable. Hepatobiliary: No cystic or solid hepatic lesions. MRCP images demonstrate no intra or extrahepatic biliary ductal dilatation. Common bile duct measures 5 mm in the porta hepatis. No filling defect in the common bile  duct to suggest choledocholithiasis. Multiple small filling defects within the gallbladder, compatible with biliary sludge. Gallbladder wall appears mildly thickened measuring up to 9 mm, and there is a small amount of pericholecystic fluid. Gallbladder appears only moderately distended. Pancreas: No pancreatic mass. No pancreatic ductal dilatation on MRCP images. No pancreatic or peripancreatic fluid or inflammatory changes. Spleen:  Unremarkable. Adrenals/Urinary Tract: Bilateral kidneys and bilateral adrenal glands are normal in appearance. There is no hydroureteronephrosis in the visualized abdomen. Stomach/Bowel: Visualized portions are unremarkable. Vascular/Lymphatic: Aortic atherosclerosis, without evidence of aneurysm in the visualized abdominal vasculature. No lymphadenopathy noted in the abdomen. Other: No significant volume of ascites noted in the visualized portions of the peritoneal cavity. Musculoskeletal: No aggressive osseous lesions are identified in the visualized portions of the skeleton. IMPRESSION: 1. Cholelithiasis with findings suggestive of acute cholecystitis, as previously demonstrated on the ultrasound examination. 2. No evidence of choledocholithiasis or findings to suggest biliary tract obstruction. 3. No other acute findings noted elsewhere in the abdomen. 4. Aortic atherosclerosis. Electronically Signed   By: Vinnie Langton M.D.   On: 05/01/2016 10:21   ASSESSMENT AND PLAN:   Jaegar Croft  is a 71 y.o. male with a known history of Coronary artery disease, diabetes mellitus type 2, hypertension, hyperlipidemia, posttraumatic stress disorder, sarcoidosis presented to the emergency room with abdominal pain since last night 9 PM. The pain is located in the epigastrium and right upper quadrant  * Acute cholecystitis   Broad spectrum ABx IV.zosyn--change to cipro +flagyl   Appreciated surgery  input--pt will need out pt CCK -s/p percutaneous CholeCystostomy tube by  interventional radiology on 04/30/2016  -okay to resume aspirin and heparin by IR. Follow-up Gall bladder  fluid culture results neg in 24 hours -pt will go home with cholecystostomy drain and f/u surgery -MRCP negative for CBD stones  * Hx of CAD   Seen by cardiologist and advised to d/c plavix and just cont asa  * DM   Keep on ISS while NPO.  * HtN   Cont coreg, losartan and amlodipine.  * COPD   No wheezing, cont spiriva.  physical therapy to see patient  Case discussed with Care Management/Social Worker. Management plans discussed with the patient, family and they are in agreement.  CODE STATUS: full  DVT Prophylaxis:heparin  TOTAL TIME TAKING CARE OF THIS PATIENT:30 minutes.  >50% time spent on counselling and coordination of care  POSSIBLE D/C IN 1-2 DAYS, DEPENDING ON CLINICAL CONDITION.  Note: This dictation was prepared with Dragon dictation along with smaller phrase technology. Any transcriptional errors that result from this process are unintentional.  Bristal Steffy M.D on 05/01/2016 at 2:53 PM  Between 7am to 6pm - Pager - [phone]  After 6pm go to [email or web address] - password [email or web address]  Office  [phone]  CC: Primary care physician; Crecencio Mc, MD

## 2016-05-02 ENCOUNTER — Telehealth: Payer: Self-pay | Admitting: Internal Medicine

## 2016-05-02 LAB — GLUCOSE, CAPILLARY: Glucose-Capillary: 135 mg/dL — ABNORMAL HIGH (ref 65–99)

## 2016-05-02 LAB — COMPREHENSIVE METABOLIC PANEL
ALT: 67 U/L — ABNORMAL HIGH (ref 17–63)
ANION GAP: 8 (ref 5–15)
AST: 29 U/L (ref 15–41)
Albumin: 3 g/dL — ABNORMAL LOW (ref 3.5–5.0)
Alkaline Phosphatase: 113 U/L (ref 38–126)
BILIRUBIN TOTAL: 1.2 mg/dL (ref 0.3–1.2)
BUN: 16 mg/dL (ref 6–20)
CALCIUM: 8.4 mg/dL — AB (ref 8.9–10.3)
CO2: 22 mmol/L (ref 22–32)
CREATININE: 0.92 mg/dL (ref 0.61–1.24)
Chloride: 104 mmol/L (ref 101–111)
Glucose, Bld: 125 mg/dL — ABNORMAL HIGH (ref 65–99)
Potassium: 3.6 mmol/L (ref 3.5–5.1)
Sodium: 134 mmol/L — ABNORMAL LOW (ref 135–145)
TOTAL PROTEIN: 6.4 g/dL — AB (ref 6.5–8.1)

## 2016-05-02 MED ORDER — METRONIDAZOLE 500 MG PO TABS: 500.0000 mg | ORAL_TABLET | Freq: Three times a day (TID) | ORAL | 0 refills | Status: AC

## 2016-05-02 MED ORDER — CIPROFLOXACIN HCL 500 MG PO TABS: 500.0000 mg | ORAL_TABLET | Freq: Two times a day (BID) | ORAL | 0 refills | Status: DC

## 2016-05-02 MED ORDER — METRONIDAZOLE 500 MG PO TABS: 500.0000 mg | ORAL_TABLET | Freq: Three times a day (TID) | ORAL | 0 refills | Status: DC

## 2016-05-02 MED ORDER — CIPROFLOXACIN HCL 500 MG PO TABS: 500.0000 mg | ORAL_TABLET | Freq: Two times a day (BID) | ORAL | 0 refills | Status: AC

## 2016-05-02 MED ORDER — OXYCODONE-ACETAMINOPHEN 5-325 MG PO TABS: 1.0000 | ORAL_TABLET | ORAL | 0 refills | Status: DC | PRN

## 2016-05-02 NOTE — Care Management (Addendum)
Informed there are no discharge needs for patient.  discharge order present.  Informed primary nurse that patient needs hands on education regarding management of his cholecystostomy tube .

## 2016-05-02 NOTE — Progress Notes (Signed)
05/02/2016 11:39 AM  Isa Rankin Schiavi to be D/C'd Home per MD order.  Discussed prescriptions and follow up appointments with the patient. Prescriptions given to patient, medication list explained in detail. Pt verbalized understanding.  Allergies as of 05/02/2016      Reactions   Peanut-containing Drug Products Swelling   Swelling, rash,       Medication List    TAKE these medications   amLODipine 10 MG tablet Commonly known as:  NORVASC Take 1 tablet (10 mg total) by mouth daily.   aspirin 81 MG tablet Take 81 mg by mouth daily. Reported on 02/19/2015   atorvastatin 80 MG tablet Commonly known as:  LIPITOR Take 80 mg by mouth daily.   carvedilol 12.5 MG tablet Commonly known as:  COREG Take 0.5 tablets (6.25 mg total) by mouth 2 (two) times daily with a meal.   ciprofloxacin 500 MG tablet Commonly known as:  CIPRO Take 1 tablet (500 mg total) by mouth 2 (two) times daily.   LORazepam 1 MG tablet Commonly known as:  ATIVAN Take 1 tablet (1 mg total) by mouth at bedtime.   losartan 50 MG tablet Commonly known as:  COZAAR Take 1 tablet (50 mg total) by mouth daily.   metroNIDAZOLE 500 MG tablet Commonly known as:  FLAGYL Take 1 tablet (500 mg total) by mouth 3 (three) times daily.   nitroGLYCERIN 0.4 MG SL tablet Commonly known as:  NITROSTAT Place 1 tablet (0.4 mg total) under the tongue every 5 (five) minutes as needed for chest pain.   omeprazole 20 MG capsule Commonly known as:  PRILOSEC Take 1 capsule (20 mg total) by mouth daily.   oxyCODONE-acetaminophen 5-325 MG tablet Commonly known as:  PERCOCET/ROXICET Take 1-2 tablets by mouth every 4 (four) hours as needed for moderate pain or severe pain.   sertraline 100 MG tablet Commonly known as:  ZOLOFT Take 1 tablet (100 mg total) by mouth daily.       Vitals:   05/02/16 0758 05/02/16 0929  BP: (!) 130/59 132/60  Pulse: 67 64  Resp: 20   Temp: 97.8 F (36.6 C)     Skin clean, dry and intact  without evidence of skin break down, no evidence of skin tears noted. IV catheter discontinued intact. Site without signs and symptoms of complications. Dressing and pressure applied. Pt denies pain at this time. No complaints noted.  An After Visit Summary was printed and given to the patient. Patient escorted via [place], and D/C home via private auto.  Dola Argyle

## 2016-05-02 NOTE — Telephone Encounter (Signed)
Pt is being discharged today. FYI, HFU/Dx was Acute Epigastric pain/Biliary drainage tube. No appt avail to sch. Pt needs to be seen in 1 week. Let me know where to sch. Thank you!

## 2016-05-02 NOTE — Telephone Encounter (Signed)
I have placed patient appointment at 11.30 on 05/05/16 please notify patient. Confirm and send note back to nurse.

## 2016-05-02 NOTE — Telephone Encounter (Signed)
Ok. Pt was called and left vm with appt reminder. Thank you!

## 2016-05-02 NOTE — Progress Notes (Signed)
CC: cholecystitis Subjective: Doing much better, no complaints Tolerating diet  Objective: Vital signs in last 24 hours: Temp:  [97.7 F (36.5 C)-98.5 F (36.9 C)] 97.8 F (36.6 C) (03/23 0758) Pulse Rate:  [59-71] 64 (03/23 0929) Resp:  [16-20] 20 (03/23 0758) BP: (100-142)/(46-64) 132/60 (03/23 0929) SpO2:  [95 %-98 %] 98 % (03/23 0758) Last BM Date: 04/27/16  Intake/Output from previous day: 03/22 0701 - 03/23 0700 In: 3036.3 [P.O.:240; I.V.:2261.3; IV [id number]:097] Out: 2830 [Urine:2550; Drains:280] Intake/Output this shift: No intake/output data recorded.  Physical exam: NAD, alert Abd: soft, NT, chole tube in place. No cholecystitis Ext: no edema, well perfused  Lab Results: CBC   Recent Labs  04/30/16 0403 05/01/16 0411  WBC 11.1* 8.4  HGB 13.7 12.4*  HCT 39.9* 36.5*  PLT 199 173   BMET  Recent Labs  05/01/16 0411 05/02/16 0505  NA 130* 134*  K 3.1* 3.6  CL 100* 104  CO2 25 22  GLUCOSE 110* 125*  BUN 19 16  CREATININE 1.34* 0.92  CALCIUM 7.9* 8.4*   PT/INR  Recent Labs  04/30/16 0403  LABPROT 15.1  INR 1.18   ABG No results for input(s): PHART, HCO3 in the last 72 hours.  Invalid input(s): PCO2, PO2  Studies/Results: Ir Perc Cholecystostomy  Result Date: 04/30/2016 CLINICAL DATA:  Acute calculus cholecystitis, epigastric pain, coronary artery disease. EXAM: PERCUTANEOUS CHOLECYSTOSTOMY TUBE PLACEMENT WITH ULTRASOUND AND FLUOROSCOPIC GUIDANCE FLUOROSCOPY TIME:  0.7 minutes (263 mGym2 DAP) TECHNIQUE: The procedure, risks (including but not limited to bleeding, infection, organ damage ), benefits, and alternatives were explained to the patient. Questions regarding the procedure were encouraged and answered. The patient understands and consents to the procedure. Survey ultrasound of the abdomen was performed and an appropriate skin entry site was identified. Skin site was marked, prepped with chlorhexidine, and draped in usual sterile  fashion, and infiltrated locally with 1% lidocaine. Intravenous Fentanyl and Versed were administered as conscious sedation during continuous monitoring of the patient's level of consciousness and physiological / cardiorespiratory status by the radiology RN, with a total moderate sedation time of 14 minutes. Under real-time ultrasound guidance, gallbladder was accessed using a transhepatic approach with a 21-gauge needle. Ultrasound image documentation was saved. Bile returned through the hub. Needle was exchanged over a 018 guidewire for transitional dilator which allowed placement of 035 J wire. Over this, a 10.2 French pigtail catheter was advanced and formed centrally in the gallbladder lumen. Approximately 90 mL of purulent bile were aspirated. Small contrast injection confirmed appropriate position. Catheter secured externally with 0 Prolene suture and placed external drain bag. Patient tolerated the procedure well. COMPLICATIONS: COMPLICATIONS none IMPRESSION: 1. Technically successful percutaneous cholecystostomy tube placement with ultrasound and fluoroscopic guidance. Electronically Signed   By: Lucrezia Europe M.D.   On: 04/30/2016 13:39   Mr Abdomen Mrcp Moise Boring Contast  Result Date: 05/01/2016 CLINICAL DATA:  71 year old male with history of abdominal pain yesterday evening, found to have evidence of acute cholecystitis on abdominal ultrasound. EXAM: MRI ABDOMEN WITHOUT AND WITH CONTRAST (INCLUDING MRCP) TECHNIQUE: Multiplanar multisequence MR imaging of the abdomen was performed both before and after the administration of intravenous contrast. Heavily T2-weighted images of the biliary and pancreatic ducts were obtained, and three-dimensional MRCP images were rendered by post processing. CONTRAST:  62mL MULTIHANCE GADOBENATE DIMEGLUMINE 529 MG/ML IV SOLN COMPARISON:  No priors. FINDINGS: Lower chest: Unremarkable. Hepatobiliary: No cystic or solid hepatic lesions. MRCP images demonstrate no intra or  extrahepatic biliary ductal dilatation.  Common bile duct measures 5 mm in the porta hepatis. No filling defect in the common bile duct to suggest choledocholithiasis. Multiple small filling defects within the gallbladder, compatible with biliary sludge. Gallbladder wall appears mildly thickened measuring up to 9 mm, and there is a small amount of pericholecystic fluid. Gallbladder appears only moderately distended. Pancreas: No pancreatic mass. No pancreatic ductal dilatation on MRCP images. No pancreatic or peripancreatic fluid or inflammatory changes. Spleen:  Unremarkable. Adrenals/Urinary Tract: Bilateral kidneys and bilateral adrenal glands are normal in appearance. There is no hydroureteronephrosis in the visualized abdomen. Stomach/Bowel: Visualized portions are unremarkable. Vascular/Lymphatic: Aortic atherosclerosis, without evidence of aneurysm in the visualized abdominal vasculature. No lymphadenopathy noted in the abdomen. Other: No significant volume of ascites noted in the visualized portions of the peritoneal cavity. Musculoskeletal: No aggressive osseous lesions are identified in the visualized portions of the skeleton. IMPRESSION: 1. Cholelithiasis with findings suggestive of acute cholecystitis, as previously demonstrated on the ultrasound examination. 2. No evidence of choledocholithiasis or findings to suggest biliary tract obstruction. 3. No other acute findings noted elsewhere in the abdomen. 4. Aortic atherosclerosis. Electronically Signed   By: Vinnie Langton M.D.   On: 05/01/2016 10:21    Anti-infectives: Anti-infectives    Start     Dose/Rate Route Frequency Ordered Stop   05/02/16 0000  ciprofloxacin (CIPRO) 500 MG tablet     500 mg Oral 2 times daily 05/02/16 0714     05/02/16 0000  metroNIDAZOLE (FLAGYL) 500 MG tablet     500 mg Oral 3 times daily 05/02/16 0714     05/01/16 1600  metroNIDAZOLE (FLAGYL) 50 mg/ml oral suspension 500 mg  Status:  Discontinued     500 mg Oral 3  times daily 05/01/16 1219 05/01/16 1220   05/01/16 1400  metroNIDAZOLE (FLAGYL) tablet 500 mg     500 mg Oral Every 8 hours 05/01/16 1220     05/01/16 1300  ciprofloxacin (CIPRO) tablet 500 mg     500 mg Oral 2 times daily 05/01/16 1219     04/28/16 1400  piperacillin-tazobactam (ZOSYN) IVPB 3.375 g  Status:  Discontinued     3.375 g 12.5 mL/hr over 240 Minutes Intravenous Every 8 hours 04/28/16 0751 05/01/16 1219   04/28/16 0600  piperacillin-tazobactam (ZOSYN) IVPB 3.375 g     3.375 g 100 mL/hr over 30 Minutes Intravenous  Once 04/28/16 0557 04/28/16 9629      Assessment/Plan: Doing well DC home w 7 days of cipro and flagyl Keep chole tube F/U in our office 2 weeks Interval cholecystectomy 6 weeks   Caroleen Hamman, MD, [facility]  05/02/2016

## 2016-05-03 LAB — CULTURE, BLOOD (ROUTINE X 2)
CULTURE: NO GROWTH
Culture: NO GROWTH
SPECIAL REQUESTS: ADEQUATE

## 2016-05-05 ENCOUNTER — Ambulatory Visit (INDEPENDENT_AMBULATORY_CARE_PROVIDER_SITE_OTHER): Payer: Medicare HMO | Admitting: Internal Medicine

## 2016-05-05 ENCOUNTER — Encounter: Payer: Self-pay | Admitting: Internal Medicine

## 2016-05-05 VITALS — BP 126/78 | HR 69 | Temp 98.0°F | Resp 16 | Ht 70.0 in | Wt 189.0 lb

## 2016-05-05 DIAGNOSIS — K819 Cholecystitis, unspecified: Secondary | ICD-10-CM | POA: Diagnosis not present

## 2016-05-05 DIAGNOSIS — R740 Nonspecific elevation of levels of transaminase and lactic acid dehydrogenase [LDH]: Secondary | ICD-10-CM | POA: Diagnosis not present

## 2016-05-05 DIAGNOSIS — I10 Essential (primary) hypertension: Secondary | ICD-10-CM

## 2016-05-05 DIAGNOSIS — K8000 Calculus of gallbladder with acute cholecystitis without obstruction: Secondary | ICD-10-CM

## 2016-05-05 DIAGNOSIS — R7401 Elevation of levels of liver transaminase levels: Secondary | ICD-10-CM

## 2016-05-05 DIAGNOSIS — E871 Hypo-osmolality and hyponatremia: Secondary | ICD-10-CM

## 2016-05-05 LAB — CBC WITH DIFFERENTIAL/PLATELET
Basophils Absolute: 0.1 10*3/uL (ref 0.0–0.1)
Basophils Relative: 1.4 % (ref 0.0–3.0)
EOS ABS: 0.5 10*3/uL (ref 0.0–0.7)
EOS PCT: 5.9 % — AB (ref 0.0–5.0)
HCT: 43.9 % (ref 39.0–52.0)
HEMOGLOBIN: 14.4 g/dL (ref 13.0–17.0)
LYMPHS ABS: 1.6 10*3/uL (ref 0.7–4.0)
Lymphocytes Relative: 17.7 % (ref 12.0–46.0)
MCHC: 32.8 g/dL (ref 30.0–36.0)
MCV: 81.9 fl (ref 78.0–100.0)
MONO ABS: 1.2 10*3/uL — AB (ref 0.1–1.0)
Monocytes Relative: 13.3 % — ABNORMAL HIGH (ref 3.0–12.0)
NEUTROS PCT: 61.7 % (ref 43.0–77.0)
Neutro Abs: 5.7 10*3/uL (ref 1.4–7.7)
Platelets: 402 10*3/uL — ABNORMAL HIGH (ref 150.0–400.0)
RBC: 5.36 Mil/uL (ref 4.22–5.81)
RDW: 16.1 % — ABNORMAL HIGH (ref 11.5–15.5)
WBC: 9.2 10*3/uL (ref 4.0–10.5)

## 2016-05-05 LAB — COMPREHENSIVE METABOLIC PANEL
ALK PHOS: 106 U/L (ref 39–117)
ALT: 73 U/L — AB (ref 0–53)
AST: 63 U/L — AB (ref 0–37)
Albumin: 4.3 g/dL (ref 3.5–5.2)
BILIRUBIN TOTAL: 1.1 mg/dL (ref 0.2–1.2)
BUN: 18 mg/dL (ref 6–23)
CO2: 30 mEq/L (ref 19–32)
CREATININE: 1.18 mg/dL (ref 0.40–1.50)
Calcium: 10.3 mg/dL (ref 8.4–10.5)
Chloride: 99 mEq/L (ref 96–112)
GFR: 64.72 mL/min (ref >60.00)
Glucose, Bld: 135 mg/dL — ABNORMAL HIGH (ref 70–99)
POTASSIUM: 5.3 meq/L — AB (ref 3.5–5.1)
SODIUM: 138 meq/L (ref 135–145)
TOTAL PROTEIN: 8 g/dL (ref 6.0–8.3)

## 2016-05-05 LAB — BODY FLUID CULTURE

## 2016-05-05 MED ORDER — SODIUM CHLORIDE 0.9 % IR SOLN: 10.0000 mL | Freq: Every day | 1 refills | Status: DC

## 2016-05-05 MED ORDER — SYRINGE (DISPOSABLE) 10 ML MISC: 1 refills | Status: DC

## 2016-05-05 NOTE — Patient Instructions (Signed)
Return on Thursday for dressing change  DO NOT SHOWER .  SHOWERING INCREASES YOUR RISK OF GETTING AN INFECTION IN YOUR WOUND  FLUSH THE CATHETER ONCE A DAY WITH STERILE SALINE USING THE SYRINGES AND BOTTLE I PRESCRIBED   IF YOU CANNOT AFFORD THESE,  YOU CAN RETURN TO THE OFFICE DAILY,  BUT WE WILL BE CLOSED Friday THROUGH Sunday  CONTINUE 1/2 CARVEDILOL TWICE DAILY

## 2016-05-05 NOTE — Progress Notes (Signed)
Subjective:  Patient ID: Ricky Abbott, male    DOB: 1945/09/09  Age: 71 y.o. MRN: [id number]  CC: The primary encounter diagnosis was Hyponatremia. Diagnoses of Cholecystitis, Essential hypertension, Acute calculous cholecystitis, and Transaminitis were also pertinent to this visit.  HPI Ricky Abbott presents for hospital follow up.  He was  discharged on Friday March 23 from [facility] after being admitted on March 19th with RUQ/ epigastric pain and recurrent emesis secondary to acute cholecystitis .  He was treated with empiric antibiotics and a cholecystostomy percutaneous drain was placed.  He was discharged on oral cipro and flagl for 7 days,  With an Chole tube in place, no sterile dressing,  No home health care ordered for dressing changes or catheter flushes ,  And states that he received no direct instructions from nursing on how to flush his catheter or change his dressing,  He was given a 2 week follow up with general surgeon diego pabon.  No discharge summary is done but the progress notes state that the plan is for a cholecystectomy  in 6 weeks   Patient presents today frustrated and concerned about infection.  He was discharged without a sterile dressingn over his cholecystostomy PC drain .  He tried to cover it with a sandhich bag and electrical tape but the makeshift dressing became saturated during his shower.  Review of hospitalization records confirm patient's complaint that he received  NO EDUCATION REGARDING MANAGEMENT OF HIS CHOLECYSTECTOMY TUBE.    Otherwsie he feels fine,  No abd pain or nausea.   He has been eating only egg whites and green/red peppers since discharge due to concern raised by surgeon about his cholesterol being the cause of his GB disease.  He has had an unintentional weight loss of 13 lbs since his last visit with me on Feb 26   Outpatient Medications Prior to Visit  Medication Sig Dispense Refill  . amLODipine (NORVASC) 10 MG tablet Take 1 tablet (10 mg  total) by mouth daily. 90 tablet 1  . aspirin 81 MG tablet Take 81 mg by mouth daily. Reported on 02/19/2015    . atorvastatin (LIPITOR) 80 MG tablet Take 80 mg by mouth daily.    . carvedilol (COREG) 12.5 MG tablet Take 0.5 tablets (6.25 mg total) by mouth 2 (two) times daily with a meal. 180 tablet 1  . ciprofloxacin (CIPRO) 500 MG tablet Take 1 tablet (500 mg total) by mouth 2 (two) times daily. 14 tablet 0  . LORazepam (ATIVAN) 1 MG tablet Take 1 tablet (1 mg total) by mouth at bedtime. 90 tablet 0  . losartan (COZAAR) 50 MG tablet Take 1 tablet (50 mg total) by mouth daily. 90 tablet 3  . metroNIDAZOLE (FLAGYL) 500 MG tablet Take 1 tablet (500 mg total) by mouth 3 (three) times daily. 21 tablet 0  . nitroGLYCERIN (NITROSTAT) 0.4 MG SL tablet Place 1 tablet (0.4 mg total) under the tongue every 5 (five) minutes as needed for chest pain. 25 tablet 1  . omeprazole (PRILOSEC) 20 MG capsule Take 1 capsule (20 mg total) by mouth daily. 90 capsule 1  . oxyCODONE-acetaminophen (PERCOCET/ROXICET) 5-325 MG tablet Take 1-2 tablets by mouth every 4 (four) hours as needed for moderate pain or severe pain. 30 tablet 0  . sertraline (ZOLOFT) 100 MG tablet Take 1 tablet (100 mg total) by mouth daily. 90 tablet 1   No facility-administered medications prior to visit.     Review of Systems;  Patient denies headache, fevers, malaise, skin rash, eye pain, sinus congestion and sinus pain, sore throat, dysphagia,  hemoptysis , cough, dyspnea, wheezing, chest pain, palpitations, orthopnea, edema, abdominal pain, nausea, melena, diarrhea, constipation, flank pain, dysuria, hematuria, urinary  Frequency, nocturia, numbness, tingling, seizures,  Focal weakness, Loss of consciousness,  Tremor, insomnia, depression, anxiety, and suicidal ideation.      Objective:  BP 126/78 (BP Location: Left Arm, Patient Position: Sitting, Cuff Size: Normal)   Pulse 69   Temp 98 F (36.7 C) (Oral)   Resp 16   Ht 5' 10" (1.778 m)    Wt 189 lb (85.7 kg)   SpO2 95%   BMI 27.12 kg/m   BP Readings from Last 3 Encounters:  05/05/16 126/78  05/02/16 132/60  04/15/16 (!) 160/94    Wt Readings from Last 3 Encounters:  05/05/16 189 lb (85.7 kg)  04/30/16 202 lb (91.6 kg)  04/15/16 194 lb 8 oz (88.2 kg)    General appearance: alert, cooperative and appears stated age Neck: no adenopathy, no carotid bruit, supple, symmetrical, trachea midline and thyroid not enlarged, symmetric, no tenderness/mass/nodules Back: symmetric, no curvature. ROM normal. No CVA tenderness. Lungs: clear to auscultation bilaterally Heart: regular rate and rhythm, S1, S2 normal, no murmur, click, rub or gallop Abdomen: soft, non-tender; bowel sounds normal. RUQ percutaneous drain with catheter not dressed with sterile dressing,  No erythema or exudate.  no masses,  no organomegaly Pulses: 2+ and symmetric Skin: Skin color, texture, turgor normal. No rashes or lesions Lymph nodes: Cervical, supraclavicular, and axillary nodes normal.  Lab Results  Component Value Date   HGBA1C 7.1 04/07/2016   HGBA1C 6.8 (H) 09/27/2015   HGBA1C 6.6 (H) 05/29/2015    Lab Results  Component Value Date   CREATININE 1.18 05/05/2016   CREATININE 0.92 05/02/2016   CREATININE 1.34 (H) 05/01/2016    Lab Results  Component Value Date   WBC 9.2 05/05/2016   HGB 14.4 05/05/2016   HCT 43.9 05/05/2016   PLT 402.0 (H) 05/05/2016   GLUCOSE 135 (H) 05/05/2016   CHOL 171 04/18/2016   TRIG 157.0 (H) 04/18/2016   HDL 29.90 (L) 04/18/2016   LDLDIRECT 117.0 04/18/2016   LDLCALC 110 (H) 04/18/2016   ALT 73 (H) 05/05/2016   AST 63 (H) 05/05/2016   NA 138 05/05/2016   K 5.3 (H) 05/05/2016   CL 99 05/05/2016   CREATININE 1.18 05/05/2016   BUN 18 05/05/2016   CO2 30 05/05/2016   TSH 0.95 02/19/2015   PSA 1.82 02/19/2015   INR 1.18 04/30/2016   HGBA1C 7.1 04/07/2016   MICROALBUR 1.3 04/07/2016    Dg Chest Portable 1 View  Result Date: 04/28/2016 CLINICAL  DATA:  Chest pain.  History of heart attack. EXAM: PORTABLE CHEST 1 VIEW COMPARISON:  None. FINDINGS: Normal heart size and pulmonary vascularity. Mediastinal contours appear intact. Calcification of the aorta. No focal airspace disease or consolidation in the lungs. No blunting of costophrenic angles. No pneumothorax. Degenerative changes in the spine. IMPRESSION: No active disease. Electronically Signed   By: Lucienne Capers M.D.   On: 04/28/2016 05:03   US Abdomen Limited Ruq  Result Date: 04/28/2016 CLINICAL DATA:  Epigastric pain for 8 hours. EXAM: US ABDOMEN LIMITED - RIGHT UPPER QUADRANT COMPARISON:  None. FINDINGS: Gallbladder: Cholelithiasis with multiple stones in the gallbladder neck measuring up to the 12 mm diameter. Layering sludge in the gallbladder. No gallbladder wall thickening or edema. Murphy's sign is positive. Common bile duct:  Diameter: 5.2 mm, normal Liver: No focal lesion identified. Within normal limits in parenchymal echogenicity. IMPRESSION: Cholelithiasis and gallbladder sludge with positive Murphy's sign. Changes are consistent with acute cholecystitis in the appropriate clinical setting. Electronically Signed   By: Lucienne Capers M.D.   On: 04/28/2016 05:48    Assessment & Plan:   Problem List Items Addressed This Visit    Acute calculous cholecystitis    s/p admission to [facility] on March 19 for management with cholecystostomy with PC drain, empiric antibiotics, and eventual cholecystectomy.  He appears clinically well today and is tolerating cipro/flagyl without diarrhea.   Patient's catheter site was covered with tegaderm and he will return on thursday for dressing change.  He was advised NOT TO SHOWER to avoid contamination of site.  He required an explanation and encouragement to consider  The alternative of  sponge bathing in the sink. He received no sterile supplies at discharge.  He was given rx for 109 nl syringes and 100 ml sterile saline  Bottles and instructed on  how to flush his catheter daily .  60 minutes was spent with patient reviewing his hospitalization and providing him with the necessary instruction on home care that he did not receive prior to discharge .  Hospital follow up charge was not  applied because we were unable to contact him prior to his appointment .      Essential hypertension    Continue 1/2 dose carvedilol bid amlodipine 10 m g and  Losartan 50  Mg daily       Transaminitis    New onset,  notedly normal in early March. IN the setting of acute cholecystitis managed with drainage catheter and abx.  Has not been flushing drain since hospital discharge or noting the amount of effluent coming from drain .  Will follow, given normal alk phos and clinically improved picture,  Repeat one week    Lab Results  Component Value Date   ALT 73 (H) 05/05/2016   AST 63 (H) 05/05/2016   ALKPHOS 106 05/05/2016   BILITOT 1.1 05/05/2016          Other Visit Diagnoses    Hyponatremia    -  Primary   Relevant Orders   Comprehensive metabolic panel (Completed)   Cholecystitis       Relevant Orders   CBC with Differential/Platelet (Completed)      I am having Mr. Boliver start on Syringe (Disposable) and sodium chloride irrigation. I am also having him maintain his aspirin, nitroGLYCERIN, amLODipine, LORazepam, losartan, carvedilol, omeprazole, sertraline, atorvastatin, oxyCODONE-acetaminophen, ciprofloxacin, and metroNIDAZOLE.  Meds ordered this encounter  Medications  . Syringe, Disposable, (B-D SYRINGE LUER-LOK 10CC) 10 ML MISC    Sig: USE TO FLUSH CATHETER WITH 10 ML SALINE DAILY    Dispense:  10 each    Refill:  1  . sodium chloride irrigation (ARGYLE STERILE SALINE) 0.9 % irrigation    Sig: Irrigate with 10 mLs as directed daily.    Dispense:  100 mL    Refill:  1    There are no discontinued medications.  Follow-up: No Follow-up on file.   Crecencio Mc, MD

## 2016-05-05 NOTE — Progress Notes (Signed)
Unable to reach patient before appointment TO  TCM.

## 2016-05-05 NOTE — Telephone Encounter (Signed)
UN able to reach patient this morning for TCM call.

## 2016-05-05 NOTE — Discharge Summary (Signed)
[place] [facility] [place] NAME: Ricky Abbott    MR#:  [id number]  DATE OF BIRTH:  05/08/1945  DATE OF ADMISSION:  04/28/2016 ADMITTING PHYSICIAN: Saundra Shelling, MD  DATE OF DISCHARGE: 05/02/2016 12:22 PM  PRIMARY CARE PHYSICIAN: Crecencio Mc, MD    ADMISSION DIAGNOSIS:  Acute epigastric pain [R10.13] Platelet inhibition due to Plavix [D69.59, T45.515A] Acute calculous cholecystitis [K80.00] Leukocytosis, unspecified type [D72.829]  DISCHARGE DIAGNOSIS:  Active Problems:   CAD, multiple vessel   Hyperlipidemia   Essential hypertension   S/P coronary artery stent placement   Hepatitis C   PTSD (post-traumatic stress disorder)   Acute calculous cholecystitis   Pre-operative cardiovascular examination   Medication management   SECONDARY DIAGNOSIS:   Past Medical History:  Diagnosis Date  . Allergy   . Asthma   . CAD, multiple vessel   . Diabetes mellitus without complication ([facility])   . History of chicken pox   . Hyperlipidemia   . Hypertension   . MI (myocardial infarction)   . PTSD (post-traumatic stress disorder)    [place] [facility]  . Sarcoidosis of lung [facility])     HOSPITAL COURSE:   KennethJusticeis a 71 y.o.malewith a known history of Coronary artery disease, diabetes mellitus type 2, hypertension, hyperlipidemia, posttraumatic stress disorder, sarcoidosis presented to the emergency room with abdominal pain.  * Acute cholecystitis-Patient was admitted to the hospital started on broad-spectrum IV antibiotics with IV Zosyn and switched over to ofloxacin and Flagyl.  -patient was seen by general surgery to his age fibrillation with RVR did not want to do urgent surgical intervention therefore patient underwent percutaneous cholecystostomy tube done by interventional radiology on 04/30/2016. -Post intervention patient's abdominal pain has improved. He is afebrile and hemodynamically stable. -He is being discharged on oral  ciprofloxacin and Flagyl with follow-up with general surgery as an outpatient for eventual cholecystectomy to be done in the next few weeks.  * Hx of CAD - no acute chest pain while in hospital. Pt. Will resume his ASA, Statin, Coreg.   * DM - while in the hospital pt. Was maintained on SSI.   * HtN - pt. Will Cont coreg, losartan and amlodipine.  * COPD - no acute exacerbation while in hospital.    DISCHARGE CONDITIONS:   Stable.   CONSULTS OBTAINED:  Treatment Team:  Florene Glen, MD  DRUG ALLERGIES:   Allergies  Allergen Reactions  . Peanut-Containing Drug Products Swelling    Swelling, rash,     DISCHARGE MEDICATIONS:   Allergies as of 05/02/2016      Reactions   Peanut-containing Drug Products Swelling   Swelling, rash,       Medication List    TAKE these medications   amLODipine 10 MG tablet Commonly known as:  NORVASC Take 1 tablet (10 mg total) by mouth daily.   aspirin 81 MG tablet Take 81 mg by mouth daily. Reported on 02/19/2015   atorvastatin 80 MG tablet Commonly known as:  LIPITOR Take 80 mg by mouth daily.   carvedilol 12.5 MG tablet Commonly known as:  COREG Take 0.5 tablets (6.25 mg total) by mouth 2 (two) times daily with a meal.   ciprofloxacin 500 MG tablet Commonly known as:  CIPRO Take 1 tablet (500 mg total) by mouth 2 (two) times daily.   LORazepam 1 MG tablet Commonly known as:  ATIVAN Take 1 tablet (1 mg total) by mouth at bedtime.   losartan 50 MG tablet  Commonly known as:  COZAAR Take 1 tablet (50 mg total) by mouth daily.   metroNIDAZOLE 500 MG tablet Commonly known as:  FLAGYL Take 1 tablet (500 mg total) by mouth 3 (three) times daily.   nitroGLYCERIN 0.4 MG SL tablet Commonly known as:  NITROSTAT Place 1 tablet (0.4 mg total) under the tongue every 5 (five) minutes as needed for chest pain.   omeprazole 20 MG capsule Commonly known as:  PRILOSEC Take 1 capsule (20 mg total) by mouth daily.    oxyCODONE-acetaminophen 5-325 MG tablet Commonly known as:  PERCOCET/ROXICET Take 1-2 tablets by mouth every 4 (four) hours as needed for moderate pain or severe pain.   sertraline 100 MG tablet Commonly known as:  ZOLOFT Take 1 tablet (100 mg total) by mouth daily.         DISCHARGE INSTRUCTIONS:   DIET:  Cardiac diet  DISCHARGE CONDITION:  Stable  ACTIVITY:  Activity as tolerated  OXYGEN:  Home Oxygen: No.   Oxygen Delivery: room air  DISCHARGE LOCATION:  home   If you experience worsening of your admission symptoms, develop shortness of breath, life threatening emergency, suicidal or homicidal thoughts you must seek medical attention immediately by calling 911 or calling your MD immediately  if symptoms less severe.  You Must read complete instructions/literature along with all the possible adverse reactions/side effects for all the Medicines you take and that have been prescribed to you. Take any new Medicines after you have completely understood and accpet all the possible adverse reactions/side effects.   Please note  You were cared for by a hospitalist during your hospital stay. If you have any questions about your discharge medications or the care you received while you were in the hospital after you are discharged, you can call the unit and asked to speak with the hospitalist on call if the hospitalist that took care of you is not available. Once you are discharged, your primary care physician will handle any further medical issues. Please note that NO REFILLS for any discharge medications will be authorized once you are discharged, as it is imperative that you return to your primary care physician (or establish a relationship with a primary care physician if you do not have one) for your aftercare needs so that they can reassess your need for medications and monitor your lab values.     Today   Abdominal pain, status post percutaneous cholecystostomy tube  placement. Afebrile. Tolerating a diet well. DC home today with close follow-up with general surgery as an outpatient.  VITAL SIGNS:  Blood pressure 132/60, pulse 64, temperature 97.8 F (36.6 C), temperature source Oral, resp. rate 20, height 5\' 10"  (1.778 m), weight 91.6 kg (202 lb), SpO2 98 %.  I/O:  No intake or output data in the 24 hours ending 05/05/16 1513  PHYSICAL EXAMINATION:  GENERAL:  71 y.o.-year-old patient lying in the bed with no acute distress.  EYES: Pupils equal, round, reactive to light and accommodation. No scleral icterus. Extraocular muscles intact.  HEENT: Head atraumatic, normocephalic. Oropharynx and nasopharynx clear.  NECK:  Supple, no jugular venous distention. No thyroid enlargement, no tenderness.  LUNGS: Normal breath sounds bilaterally, no wheezing, rales,rhonchi. No use of accessory muscles of respiration.  CARDIOVASCULAR: S1, S2 normal. No murmurs, rubs, or gallops.  ABDOMEN: Soft, non-tender, non-distended. Bowel sounds present. No organomegaly or mass. s/p IR guided cholecystostomy tube placement.   EXTREMITIES: No pedal edema, cyanosis, or clubbing.  NEUROLOGIC: Cranial nerves II through XII  are intact. No focal motor or sensory defecits b/l.  PSYCHIATRIC: The patient is alert and oriented x 3.   SKIN: No obvious rash, lesion, or ulcer.   DATA REVIEW:   CBC  Recent Labs Lab 05/01/16 0411  WBC 8.4  HGB 12.4*  HCT 36.5*  PLT 173    Chemistries   Recent Labs Lab 05/02/16 0505  NA 134*  K 3.6  CL 104  CO2 22  GLUCOSE 125*  BUN 16  CREATININE 0.92  CALCIUM 8.4*  AST 29  ALT 67*  ALKPHOS 113  BILITOT 1.2    Cardiac Enzymes No results for input(s): TROPONINI in the last 168 hours.  Microbiology Results  Results for orders placed or performed during the hospital encounter of 04/28/16  Blood culture (routine x 2)     Status: None   Collection Time: 04/28/16  6:09 AM  Result Value Ref Range Status   Specimen Description BLOOD  LEFT HAND  Final   Special Requests   Final    BOTTLES DRAWN AEROBIC AND ANAEROBIC BCAV ADEQUATE VOLUME   Culture NO GROWTH 5 DAYS  Final   Report Status 05/03/2016 FINAL  Final  Blood culture (routine x 2)     Status: None   Collection Time: 04/28/16  6:09 AM  Result Value Ref Range Status   Specimen Description BLOOD RIGHT ANTECUBITAL  Final   Special Requests   Final    BOTTLES DRAWN AEROBIC AND ANAEROBIC Anton HIGH VOLUME   Culture NO GROWTH 5 DAYS  Final   Report Status 05/03/2016 FINAL  Final  Body fluid culture     Status: None   Collection Time: 04/30/16  2:00 PM  Result Value Ref Range Status   Specimen Description GALL BLADDER CHOLECYSTOSTOMY TUBE FLUID, SYRINGE  Final   Special Requests NONE  Final   Gram Stain   Final    ABUNDANT WBC PRESENT, PREDOMINANTLY PMN RARE GRAM POSITIVE COCCI IN PAIRS    Culture   Final    RARE STREPTOCOCCUS ANGINOSIS NO ANAEROBES ISOLATED Performed at [facility], [place] [address], [place], [place] [address]    Report Status 05/05/2016 FINAL  Final   Organism ID, Bacteria STREPTOCOCCUS ANGINOSIS  Final      Susceptibility   Streptococcus anginosis - MIC*    PENICILLIN <=0.06 SENSITIVE Sensitive     CEFTRIAXONE 0.25 SENSITIVE Sensitive     ERYTHROMYCIN <=0.12 SENSITIVE Sensitive     LEVOFLOXACIN <=0.25 SENSITIVE Sensitive     VANCOMYCIN 0.5 SENSITIVE Sensitive     * RARE STREPTOCOCCUS ANGINOSIS    RADIOLOGY:  No results found.    Management plans discussed with the patient, family and they are in agreement.  CODE STATUS:  Code Status History    Date Active Date Inactive Code Status Order ID Comments User Context   04/28/2016  9:13 AM 05/02/2016  3:28 PM Full Code [id number]  Saundra Shelling, MD Inpatient      TOTAL TIME TAKING CARE OF THIS PATIENT: 40 minutes.    Henreitta Leber M.D on 05/05/2016 at 3:13 PM  Between 7am to 6pm - Pager - [phone]  After 6pm go to [email or web address] - Proofreader  [employer]  [facility]  Office  [phone]  CC: Primary care physician; Crecencio Mc, MD

## 2016-05-06 ENCOUNTER — Other Ambulatory Visit: Payer: Self-pay | Admitting: Internal Medicine

## 2016-05-06 ENCOUNTER — Ambulatory Visit
Admission: RE | Admit: 2016-05-06 | Discharge: 2016-05-06 | Disposition: A | Discharge status: Home / Self Care | Payer: Medicare HMO | Source: Ambulatory Visit | Attending: Internal Medicine | Admitting: Internal Medicine

## 2016-05-06 DIAGNOSIS — M5090 Cervical disc disorder, unspecified, unspecified cervical region: Secondary | ICD-10-CM

## 2016-05-06 DIAGNOSIS — M5021 Other cervical disc displacement,  high cervical region: Secondary | ICD-10-CM | POA: Diagnosis not present

## 2016-05-06 DIAGNOSIS — M2578 Osteophyte, vertebrae: Secondary | ICD-10-CM | POA: Diagnosis not present

## 2016-05-06 DIAGNOSIS — M47812 Spondylosis without myelopathy or radiculopathy, cervical region: Secondary | ICD-10-CM | POA: Diagnosis not present

## 2016-05-06 DIAGNOSIS — R42 Dizziness and giddiness: Secondary | ICD-10-CM | POA: Diagnosis not present

## 2016-05-06 DIAGNOSIS — R748 Abnormal levels of other serum enzymes: Secondary | ICD-10-CM

## 2016-05-06 DIAGNOSIS — M5030 Other cervical disc degeneration, unspecified cervical region: Secondary | ICD-10-CM | POA: Insufficient documentation

## 2016-05-06 DIAGNOSIS — M542 Cervicalgia: Secondary | ICD-10-CM | POA: Diagnosis not present

## 2016-05-06 DIAGNOSIS — M4802 Spinal stenosis, cervical region: Secondary | ICD-10-CM | POA: Diagnosis not present

## 2016-05-06 NOTE — Assessment & Plan Note (Addendum)
New onset,  notedly normal in early March. IN the setting of acute cholecystitis managed with drainage catheter and abx.  Has not been flushing drain since hospital discharge or noting the amount of effluent coming from drain .  Will follow, given normal alk phos and clinically improved picture,  Repeat one week    Lab Results  Component Value Date   ALT 73 (H) 05/05/2016   AST 63 (H) 05/05/2016   ALKPHOS 106 05/05/2016   BILITOT 1.1 05/05/2016

## 2016-05-06 NOTE — Assessment & Plan Note (Addendum)
s/p admission to [facility] on March 19 for management with cholecystostomy with PC drain, empiric antibiotics, and eventual cholecystectomy.  He appears clinically well today and is tolerating cipro/flagyl without diarrhea.   Patient's catheter site was covered with tegaderm and he will return on thursday for dressing change.  He was advised NOT TO SHOWER to avoid contamination of site.  He required an explanation and encouragement to consider  The alternative of  sponge bathing in the sink. He received no sterile supplies at discharge.  He was given rx for 109 nl syringes and 100 ml sterile saline  Bottles and instructed on how to flush his catheter daily .  60 minutes was spent with patient reviewing his hospitalization and providing him with the necessary instruction on home care that he did not receive prior to discharge .  Hospital follow up charge was not  applied because we were unable to contact him prior to his appointment .

## 2016-05-06 NOTE — Assessment & Plan Note (Addendum)
Continue 1/2 dose carvedilol bid amlodipine 10 m g and  Losartan 50  Mg daily

## 2016-05-07 ENCOUNTER — Telehealth: Payer: Self-pay | Admitting: Internal Medicine

## 2016-05-07 NOTE — Telephone Encounter (Signed)
Pt called back returning your call. Thank you!  Call pt @ [phone] (may leave msg)

## 2016-05-08 ENCOUNTER — Other Ambulatory Visit: Payer: Self-pay | Admitting: Internal Medicine

## 2016-05-08 ENCOUNTER — Other Ambulatory Visit (INDEPENDENT_AMBULATORY_CARE_PROVIDER_SITE_OTHER): Payer: Medicare HMO

## 2016-05-08 ENCOUNTER — Telehealth: Payer: Self-pay | Admitting: Internal Medicine

## 2016-05-08 DIAGNOSIS — E875 Hyperkalemia: Secondary | ICD-10-CM

## 2016-05-08 DIAGNOSIS — R748 Abnormal levels of other serum enzymes: Secondary | ICD-10-CM | POA: Diagnosis not present

## 2016-05-08 LAB — COMPREHENSIVE METABOLIC PANEL
ALK PHOS: 87 U/L (ref 39–117)
ALT: 43 U/L (ref 0–53)
AST: 25 U/L (ref 0–37)
Albumin: 4.1 g/dL (ref 3.5–5.2)
BUN: 39 mg/dL — AB (ref 6–23)
CO2: 29 mEq/L (ref 19–32)
CREATININE: 1.09 mg/dL (ref 0.40–1.50)
Calcium: 10.2 mg/dL (ref 8.4–10.5)
Chloride: 103 mEq/L (ref 96–112)
GFR: 70.92 mL/min (ref >60.00)
GLUCOSE: 125 mg/dL — AB (ref 70–99)
POTASSIUM: 5.9 meq/L — AB (ref 3.5–5.1)
SODIUM: 137 meq/L (ref 135–145)
TOTAL PROTEIN: 7.5 g/dL (ref 6.0–8.3)
Total Bilirubin: 0.7 mg/dL (ref 0.2–1.2)

## 2016-05-08 MED ORDER — CARVEDILOL 12.5 MG PO TABS: 12.5000 mg | ORAL_TABLET | Freq: Two times a day (BID) | ORAL | 1 refills | Status: DC

## 2016-05-08 MED ORDER — SODIUM POLYSTYRENE SULFONATE 15 GM/60ML PO SUSP: 30.0000 g | Freq: Once | ORAL | 0 refills | Status: DC

## 2016-05-08 MED ORDER — SODIUM POLYSTYRENE SULFONATE 15 GM/60ML PO SUSP: 15.0000 g | Freq: Once | ORAL | 0 refills | Status: AC

## 2016-05-08 NOTE — Telephone Encounter (Signed)
Ricky Abbott has already spoken with this pt and documented in a different message.

## 2016-05-08 NOTE — Telephone Encounter (Signed)
Pharmacy called and needs a new script for sodium polystyrene (KAYEXALATE) 15 GM/60ML suspension with a new set of directions. Please advise, thank you!  [place] [facility] [place] [facility] [place]

## 2016-05-08 NOTE — Telephone Encounter (Signed)
Corrected and sent

## 2016-05-08 NOTE — Telephone Encounter (Signed)
Please advise 

## 2016-05-12 ENCOUNTER — Other Ambulatory Visit (INDEPENDENT_AMBULATORY_CARE_PROVIDER_SITE_OTHER): Payer: Medicare HMO

## 2016-05-12 DIAGNOSIS — E875 Hyperkalemia: Secondary | ICD-10-CM

## 2016-05-12 LAB — BASIC METABOLIC PANEL
BUN: 22 mg/dL (ref 6–23)
CHLORIDE: 105 meq/L (ref 96–112)
CO2: 30 meq/L (ref 19–32)
CREATININE: 0.83 mg/dL (ref 0.40–1.50)
Calcium: 9.6 mg/dL (ref 8.4–10.5)
GFR: 97.13 mL/min (ref >60.00)
Glucose, Bld: 117 mg/dL — ABNORMAL HIGH (ref 70–99)
POTASSIUM: 4.6 meq/L (ref 3.5–5.1)
Sodium: 141 mEq/L (ref 135–145)

## 2016-05-13 ENCOUNTER — Other Ambulatory Visit: Payer: Self-pay | Admitting: Internal Medicine

## 2016-05-13 MED ORDER — ATORVASTATIN CALCIUM 80 MG PO TABS: 80.0000 mg | ORAL_TABLET | Freq: Every day | ORAL | 1 refills | Status: DC

## 2016-05-13 MED ORDER — AMLODIPINE BESYLATE 5 MG PO TABS
5.0000 mg | ORAL_TABLET | Freq: Every day | ORAL | 0 refills | Status: DC
Start: 2016-05-13 — End: 2016-05-15

## 2016-05-13 NOTE — Progress Notes (Signed)
amlodipine 5 mg sent to mail order.

## 2016-05-14 ENCOUNTER — Other Ambulatory Visit: Payer: Self-pay

## 2016-05-15 ENCOUNTER — Ambulatory Visit: Payer: Medicare HMO

## 2016-05-15 ENCOUNTER — Ambulatory Visit (INDEPENDENT_AMBULATORY_CARE_PROVIDER_SITE_OTHER): Payer: Medicare HMO | Admitting: Internal Medicine

## 2016-05-15 ENCOUNTER — Telehealth: Payer: Self-pay | Admitting: Internal Medicine

## 2016-05-15 ENCOUNTER — Encounter: Payer: Self-pay | Admitting: Internal Medicine

## 2016-05-15 ENCOUNTER — Other Ambulatory Visit: Payer: Self-pay | Admitting: Cardiovascular Disease

## 2016-05-15 VITALS — BP 148/84 | HR 69 | Temp 97.7°F | Resp 17 | Ht 70.0 in | Wt 190.2 lb

## 2016-05-15 DIAGNOSIS — E7800 Pure hypercholesterolemia, unspecified: Secondary | ICD-10-CM

## 2016-05-15 DIAGNOSIS — I10 Essential (primary) hypertension: Secondary | ICD-10-CM

## 2016-05-15 DIAGNOSIS — F4310 Post-traumatic stress disorder, unspecified: Secondary | ICD-10-CM

## 2016-05-15 DIAGNOSIS — M4722 Other spondylosis with radiculopathy, cervical region: Secondary | ICD-10-CM | POA: Diagnosis not present

## 2016-05-15 DIAGNOSIS — N138 Other obstructive and reflux uropathy: Secondary | ICD-10-CM

## 2016-05-15 DIAGNOSIS — K8000 Calculus of gallbladder with acute cholecystitis without obstruction: Secondary | ICD-10-CM

## 2016-05-15 DIAGNOSIS — R0609 Other forms of dyspnea: Secondary | ICD-10-CM

## 2016-05-15 DIAGNOSIS — R3911 Hesitancy of micturition: Secondary | ICD-10-CM

## 2016-05-15 DIAGNOSIS — I739 Peripheral vascular disease, unspecified: Secondary | ICD-10-CM

## 2016-05-15 DIAGNOSIS — I2510 Atherosclerotic heart disease of native coronary artery without angina pectoris: Secondary | ICD-10-CM

## 2016-05-15 DIAGNOSIS — E119 Type 2 diabetes mellitus without complications: Secondary | ICD-10-CM

## 2016-05-15 DIAGNOSIS — K521 Toxic gastroenteritis and colitis: Secondary | ICD-10-CM | POA: Diagnosis not present

## 2016-05-15 DIAGNOSIS — N401 Enlarged prostate with lower urinary tract symptoms: Secondary | ICD-10-CM

## 2016-05-15 DIAGNOSIS — Z8739 Personal history of other diseases of the musculoskeletal system and connective tissue: Secondary | ICD-10-CM

## 2016-05-15 DIAGNOSIS — G450 Vertebro-basilar artery syndrome: Secondary | ICD-10-CM

## 2016-05-15 DIAGNOSIS — F1010 Alcohol abuse, uncomplicated: Secondary | ICD-10-CM

## 2016-05-15 DIAGNOSIS — T3695XA Adverse effect of unspecified systemic antibiotic, initial encounter: Secondary | ICD-10-CM

## 2016-05-15 DIAGNOSIS — R42 Dizziness and giddiness: Secondary | ICD-10-CM | POA: Diagnosis not present

## 2016-05-15 DIAGNOSIS — E639 Nutritional deficiency, unspecified: Secondary | ICD-10-CM

## 2016-05-15 MED ORDER — ATORVASTATIN CALCIUM 80 MG PO TABS: 80.0000 mg | ORAL_TABLET | Freq: Every day | ORAL | 1 refills | Status: DC

## 2016-05-15 MED ORDER — AMLODIPINE BESYLATE 5 MG PO TABS: 5.0000 mg | ORAL_TABLET | Freq: Every day | ORAL | 0 refills | Status: DC

## 2016-05-15 MED ORDER — TAMSULOSIN HCL 0.4 MG PO CAPS: 0.4000 mg | ORAL_CAPSULE | Freq: Every day | ORAL | 3 refills | Status: DC

## 2016-05-15 MED ORDER — CARVEDILOL 12.5 MG PO TABS: 12.5000 mg | ORAL_TABLET | Freq: Two times a day (BID) | ORAL | 1 refills | Status: DC

## 2016-05-15 NOTE — Progress Notes (Signed)
Subjective:  Patient ID: Ricky Abbott, male    DOB: 1945-06-28  Age: 71 y.o. MRN: [id number]  CC: The primary encounter diagnosis was Antibiotic-associated diarrhea. Diagnoses of BPH with obstruction/lower urinary tract symptoms, Poor diet, Essential hypertension, Dyspnea on exertion, Acute calculous cholecystitis, Urinary hesitancy due to benign prostatic hyperplasia, Cervical spondylosis with radiculopathy, and VBI (vertebrobasilar insufficiency) were also pertinent to this visit.  HPI Ricky Abbott presents for follow up  on  multiple issues   1) GB drain is still draining, greenish fluid,  No redness at cath site no fevers or nausea.  appt with surgeon is Monday  2) watery diarrhea : started before taking the Kayexelate,  And he has been taking Antibiotics  since discharge , which he  finished on  Monday  But continues have  Multiple  watery stools daily for several weeks,  Had 3  Watery stools Just this morning  .    3) Hyperkalemia:  Resolved,  Was due to dietary sources and/or losartan,  Losartan stopped.  Now Eating dannon lnt n fit yogurt daily along with egg whites.  Had been eating a lot or ramen noodles prior to his hospitalization , and since discharge he was eating a lot of fruit   4) Trouble passing urine.  Has to force,  Stream is weak   5) Hypertension :  bp elevated . Has not resumed the amlodipine .    Outpatient Medications Prior to Visit  Medication Sig Dispense Refill  . aspirin 81 MG tablet Take 81 mg by mouth daily. Reported on 02/19/2015    . LORazepam (ATIVAN) 1 MG tablet Take 1 tablet (1 mg total) by mouth at bedtime. 90 tablet 0  . nitroGLYCERIN (NITROSTAT) 0.4 MG SL tablet Place 1 tablet (0.4 mg total) under the tongue every 5 (five) minutes as needed for chest pain. 25 tablet 1  . omeprazole (PRILOSEC) 20 MG capsule Take 1 capsule (20 mg total) by mouth daily. 90 capsule 1  . oxyCODONE-acetaminophen (PERCOCET/ROXICET) 5-325 MG tablet Take 1-2 tablets by  mouth every 4 (four) hours as needed for moderate pain or severe pain. 30 tablet 0  . sertraline (ZOLOFT) 100 MG tablet Take 1 tablet (100 mg total) by mouth daily. 90 tablet 1  . Sodium Chloride Flush (NORMAL SALINE FLUSH) 0.9 % SOLN     . sodium chloride irrigation (ARGYLE STERILE SALINE) 0.9 % irrigation Irrigate with 10 mLs as directed daily. 100 mL 1  . Syringe, Disposable, (B-D SYRINGE LUER-LOK 10CC) 10 ML MISC USE TO FLUSH CATHETER WITH 10 ML SALINE DAILY 10 each 1  . amLODipine (NORVASC) 5 MG tablet Take 1 tablet (5 mg total) by mouth daily. 90 tablet 0  . atorvastatin (LIPITOR) 80 MG tablet Take 1 tablet (80 mg total) by mouth daily. 90 tablet 1  . carvedilol (COREG) 12.5 MG tablet Take 1 tablet (12.5 mg total) by mouth 2 (two) times daily with a meal. 180 tablet 1   No facility-administered medications prior to visit.     Review of Systems;  Patient denies headache, fevers, malaise, unintentional weight loss, skin rash, eye pain, sinus congestion and sinus pain, sore throat, dysphagia,  hemoptysis , cough, dyspnea, wheezing, chest pain, palpitations, orthopnea, edema, abdominal pain, nausea, melena, , constipation, flank pain, dysuria, hematuria, urinary  Frequency, nocturia, numbness, tingling, seizures,  Focal weakness, Loss of consciousness,  Tremor, insomnia, depression, anxiety, and suicidal ideation.      Objective:  BP (!) 148/84  Pulse 69   Temp 97.7 F (36.5 C) (Oral)   Resp 17   Ht 5\' 10"  (1.778 m)   Wt 190 lb 3.2 oz (86.3 kg)   SpO2 97%   BMI 27.29 kg/m   BP Readings from Last 3 Encounters:  05/15/16 (!) 148/84  05/05/16 126/78  05/02/16 132/60    Wt Readings from Last 3 Encounters:  05/15/16 190 lb 3.2 oz (86.3 kg)  05/05/16 189 lb (85.7 kg)  04/30/16 202 lb (91.6 kg)    General appearance: alert, cooperative and appears stated age Ears: normal TM's and external ear canals both ears Throat: lips, mucosa, and tongue normal; teeth and gums  normal Neck: no adenopathy, no carotid bruit, supple, symmetrical, trachea midline and thyroid not enlarged, symmetric, no tenderness/mass/nodules Back: symmetric, no curvature. ROM normal. No CVA tenderness. Lungs: clear to auscultation bilaterally Heart: regular rate and rhythm, S1, S2 normal, no murmur, click, rub or gallop Abdomen: soft, non-tender; bowel sounds normal; no masses,  no organomegaly Pulses: 2+ and symmetric Skin: Skin color, texture, turgor normal. No rashes or lesions Lymph nodes: Cervical, supraclavicular, and axillary nodes normal.  Lab Results  Component Value Date   HGBA1C 7.1 04/07/2016   HGBA1C 6.8 (H) 09/27/2015   HGBA1C 6.6 (H) 05/29/2015    Lab Results  Component Value Date   CREATININE 0.83 05/12/2016   CREATININE 1.09 05/08/2016   CREATININE 1.18 05/05/2016    Lab Results  Component Value Date   WBC 9.2 05/05/2016   HGB 14.4 05/05/2016   HCT 43.9 05/05/2016   PLT 402.0 (H) 05/05/2016   GLUCOSE 117 (H) 05/12/2016   CHOL 171 04/18/2016   TRIG 157.0 (H) 04/18/2016   HDL 29.90 (L) 04/18/2016   LDLDIRECT 117.0 04/18/2016   LDLCALC 110 (H) 04/18/2016   ALT 43 05/08/2016   AST 25 05/08/2016   NA 141 05/12/2016   K 4.6 05/12/2016   CL 105 05/12/2016   CREATININE 0.83 05/12/2016   BUN 22 05/12/2016   CO2 30 05/12/2016   TSH 0.95 02/19/2015   PSA 1.82 02/19/2015   INR 1.18 04/30/2016   HGBA1C 7.1 04/07/2016   MICROALBUR 1.3 04/07/2016    Mr Cervical Spine Wo Contrast  Result Date: 05/06/2016 CLINICAL DATA:  Neck pain.  Recurrent dizziness with neck extension. EXAM: MRI CERVICAL SPINE WITHOUT CONTRAST TECHNIQUE: Multiplanar, multisequence MR imaging of the cervical spine was performed. No intravenous contrast was administered. COMPARISON:  None. FINDINGS: Alignment: Borderline anterolisthesis at C5-6. Vertebrae: No fracture, evidence of discitis, or bone lesion. Cord: Normal signal and morphology. Posterior Fossa, vertebral arteries, paraspinal  tissues: Loss of expected flow related signal in the V1 and proximal V2 segment on the right, normalized by the upper cervical spine. This could be from slow flow or short segment occlusion. Disc levels: C2-3: Unremarkable. C3-4: Posterior disc osteophyte complex, predominately disc protrusion. Facet spurring with ligamentum flavum thickening and buckling. Mild uncovertebral ridging. Spinal stenosis with early cord flattening. Mild foraminal narrowing bilaterally. C4-5: Posterior disc osteophyte complex, predominately disc protrusion. Mild facet spurring and ligamentous thickening. Mild spinal stenosis without cord deformity. Patent foramina C5-6: Ventral spondylosis which is bulky.  No impingement C6-7: Disc degeneration with bulging and spurring. Bilateral foraminal narrowing from uncovertebral ridging, greater on the left. Ventral subarachnoid space effacement without cord compression. C7-T1:Mild facet spurring.  No impingement IMPRESSION: 1. Spondylosis and disc degeneration. Disc protrusion and ligamentum flavum thickening causes spinal stenosis at C3-4 and C4-5, with early cord mass effect at C3-4. 2.  Mild foraminal narrowing bilaterally at C3-4 and C6-7. 3. Slow or no flow in the proximal right vertebral artery, normalized by the upper cervical spine. Could patient's positional dizziness be related to vertebrobasilar insufficiency? Electronically Signed   By: Monte Fantasia M.D.   On: 05/06/2016 10:12    Assessment & Plan:   Problem List Items Addressed This Visit    Acute calculous cholecystitis    s/p admission to [facility] on March 19 for management with cholecystostomy with PC drain, empiric antibiotics, and eventual cholecystectomy.  He appears clinically well today and  Has finished his course of  cipro/flagyl but now states that he hash been having since discharge.   He does not see the surgeon until Monday 6      Antibiotic-associated diarrhea - Primary    Checking for c dificile , which was  negative       Relevant Orders   Clostridium Difficile by PCR (Completed)   Cervical spondylosis with radiculopathy    Multilevel  By MRI with disk degeneration, protrusion and ligamentum flavum thickening causiing spinal stenosis at c3-5 with early cord mass effect.  Neurosurgical consult recommended.       Dyspnea on exertion    He has mild COPD by most recent PFTS      Essential hypertension    Not well controlled since stopping meds at discharge.  Resuming amlodipine       Relevant Medications   amLODipine (NORVASC) 5 MG tablet   carvedilol (COREG) 12.5 MG tablet   atorvastatin (LIPITOR) 80 MG tablet   Poor diet    Secondary to financial limitations .  Dietary information given. Checking labs today  Lab Results  Component Value Date   ALT 43 05/08/2016   AST 25 05/08/2016   ALKPHOS 87 05/08/2016   BILITOT 0.7 05/08/2016          Urinary hesitancy due to benign prostatic hyperplasia    Trial of flomax       VBI (vertebrobasilar insufficiency)    Suggested by history of positional dizziness with head tilt and MRI findings suggesting a low flow start in the vertebral artery right side .  Will need vascular evaluation as well.       Relevant Medications   amLODipine (NORVASC) 5 MG tablet   carvedilol (COREG) 12.5 MG tablet   atorvastatin (LIPITOR) 80 MG tablet    Other Visit Diagnoses    BPH with obstruction/lower urinary tract symptoms       Relevant Medications   tamsulosin (FLOMAX) 0.4 MG CAPS capsule      I am having Mr. Gloster start on tamsulosin. I am also having him maintain his aspirin, nitroGLYCERIN, LORazepam, omeprazole, sertraline, oxyCODONE-acetaminophen, Syringe (Disposable), sodium chloride irrigation, Normal Saline Flush, amLODipine, carvedilol, and atorvastatin.  Meds ordered this encounter  Medications  . amLODipine (NORVASC) 5 MG tablet    Sig: Take 1 tablet (5 mg total) by mouth daily.    Dispense:  90 tablet    Refill:  0  .  carvedilol (COREG) 12.5 MG tablet    Sig: Take 1 tablet (12.5 mg total) by mouth 2 (two) times daily with a meal.    Dispense:  180 tablet    Refill:  1  . atorvastatin (LIPITOR) 80 MG tablet    Sig: Take 1 tablet (80 mg total) by mouth daily.    Dispense:  90 tablet    Refill:  1  . tamsulosin (FLOMAX) 0.4 MG CAPS capsule  Sig: Take 1 capsule (0.4 mg total) by mouth daily.    Dispense:  30 capsule    Refill:  3   A total of 40 minutes was spent with patient more than half of which was spent in counseling patient on the above mentioned issues , reviewing and explaining recent labs and imaging studies done, and coordination of care. Medications Discontinued During This Encounter  Medication Reason  . amLODipine (NORVASC) 5 MG tablet Reorder  . carvedilol (COREG) 12.5 MG tablet Reorder  . atorvastatin (LIPITOR) 80 MG tablet Reorder    Follow-up: Return in about 4 weeks (around 06/12/2016).   Crecencio Mc, MD

## 2016-05-15 NOTE — Telephone Encounter (Signed)
PT called back returning your call. Thank you!  Call pt @ [phone]

## 2016-05-15 NOTE — Progress Notes (Signed)
Pre visit review using our clinic review tool, if applicable. No additional management support is needed unless otherwise documented below in the visit note. 

## 2016-05-15 NOTE — Telephone Encounter (Signed)
Patient scheduled at 11:30

## 2016-05-15 NOTE — Patient Instructions (Addendum)
Your diarrhea may be due to an infection caused by your recent antibiotics.  We will check your stool today and treat if present   I sent Flomax to [facility] to help shrink your prostate

## 2016-05-16 ENCOUNTER — Other Ambulatory Visit: Payer: Medicare HMO

## 2016-05-16 DIAGNOSIS — T3695XA Adverse effect of unspecified systemic antibiotic, initial encounter: Secondary | ICD-10-CM | POA: Diagnosis not present

## 2016-05-16 DIAGNOSIS — K521 Toxic gastroenteritis and colitis: Secondary | ICD-10-CM

## 2016-05-16 LAB — VAS US LOWER EXTREMITY ARTERIAL DUPLEX
RIGHT PERO MID SYS: -24 cm/s
RIGHT POPLITEAL DIST EDV: 0 cm/s
RIGHT POPLITEAL PROX EDV: 0 cm/s
RIGHT SUPER FEMORAL MID EDV: 0 cm/s
RTIBMIDDIA: -1 cm/s
RTIBMIDSYS: 42 cm/s
RTSFADISTDIA: 0 cm/s
RTSFAPROXDIA: 0 cm/s
Right popliteal dist sys PSV: -65 cm/s
Right popliteal prox sys PSV: 64 cm/s
Right super femoral dist sys PSV: -95 cm/s
Right super femoral mid sys PSV: -85 cm/s
Right super femoral prox sys PSV: -93 cm/s

## 2016-05-16 LAB — VAS US CAROTID
LCCAPSYS: 82 cm/s
LEFT ECA DIAS: 19 cm/s
LEFT VERTEBRAL DIAS: 18 cm/s
LICADDIAS: -19 cm/s
LICAPDIAS: -11 cm/s
LICAPSYS: -76 cm/s
Left CCA dist dias: 20 cm/s
Left CCA dist sys: 75 cm/s
Left CCA prox dias: 20 cm/s
Left ICA dist sys: -70 cm/s
RCCAPDIAS: -17 cm/s
RIGHT ECA DIAS: 30 cm/s
RIGHT VERTEBRAL DIAS: -42 cm/s
Right CCA prox sys: -63 cm/s
Right cca dist sys: -88 cm/s

## 2016-05-17 DIAGNOSIS — M4722 Other spondylosis with radiculopathy, cervical region: Secondary | ICD-10-CM | POA: Insufficient documentation

## 2016-05-17 DIAGNOSIS — T3695XA Adverse effect of unspecified systemic antibiotic, initial encounter: Principal | ICD-10-CM

## 2016-05-17 DIAGNOSIS — R3911 Hesitancy of micturition: Secondary | ICD-10-CM

## 2016-05-17 DIAGNOSIS — K521 Toxic gastroenteritis and colitis: Secondary | ICD-10-CM | POA: Insufficient documentation

## 2016-05-17 DIAGNOSIS — N401 Enlarged prostate with lower urinary tract symptoms: Secondary | ICD-10-CM | POA: Insufficient documentation

## 2016-05-17 DIAGNOSIS — G450 Vertebro-basilar artery syndrome: Secondary | ICD-10-CM | POA: Insufficient documentation

## 2016-05-17 LAB — CLOSTRIDIUM DIFFICILE BY PCR: Toxigenic C. Difficile by PCR: NOT DETECTED

## 2016-05-17 NOTE — Assessment & Plan Note (Signed)
Suggested by history of positional dizziness with head tilt and MRI findings suggesting a low flow start in the vertebral artery right side .  Will need vascular evaluation as well.

## 2016-05-17 NOTE — Assessment & Plan Note (Signed)
Secondary to financial limitations .  Dietary information given. Checking labs today  Lab Results  Component Value Date   ALT 43 05/08/2016   AST 25 05/08/2016   ALKPHOS 87 05/08/2016   BILITOT 0.7 05/08/2016

## 2016-05-17 NOTE — Assessment & Plan Note (Addendum)
Checking for c dificile , which was negative

## 2016-05-17 NOTE — Assessment & Plan Note (Signed)
He has mild COPD by most recent PFTS

## 2016-05-17 NOTE — Assessment & Plan Note (Signed)
Multilevel  By MRI with disk degeneration, protrusion and ligamentum flavum thickening causiing spinal stenosis at c3-5 with early cord mass effect.  Neurosurgical consult recommended.

## 2016-05-17 NOTE — Assessment & Plan Note (Signed)
s/p admission to [facility] on March 19 for management with cholecystostomy with PC drain, empiric antibiotics, and eventual cholecystectomy.  He appears clinically well today and  Has finished his course of  cipro/flagyl but now states that he hash been having since discharge.   He does not see the surgeon until Monday 6

## 2016-05-17 NOTE — Assessment & Plan Note (Signed)
Trial of flomax

## 2016-05-17 NOTE — Assessment & Plan Note (Signed)
Not well controlled since stopping meds at discharge.  Resuming amlodipine

## 2016-05-19 ENCOUNTER — Ambulatory Visit (INDEPENDENT_AMBULATORY_CARE_PROVIDER_SITE_OTHER): Payer: Medicare HMO | Admitting: Surgery

## 2016-05-19 ENCOUNTER — Telehealth: Payer: Self-pay | Admitting: Internal Medicine

## 2016-05-19 ENCOUNTER — Other Ambulatory Visit: Payer: Self-pay

## 2016-05-19 ENCOUNTER — Encounter: Payer: Self-pay | Admitting: Surgery

## 2016-05-19 VITALS — BP 194/95 | HR 98 | Temp 97.8°F | Ht 70.0 in | Wt 191.0 lb

## 2016-05-19 DIAGNOSIS — Z8719 Personal history of other diseases of the digestive system: Secondary | ICD-10-CM

## 2016-05-19 DIAGNOSIS — Z09 Encounter for follow-up examination after completed treatment for conditions other than malignant neoplasm: Secondary | ICD-10-CM

## 2016-05-19 DIAGNOSIS — Z934 Other artificial openings of gastrointestinal tract status: Secondary | ICD-10-CM

## 2016-05-19 MED ORDER — CARVEDILOL 12.5 MG PO TABS: 12.5000 mg | ORAL_TABLET | Freq: Two times a day (BID) | ORAL | 1 refills | Status: DC

## 2016-05-19 NOTE — Patient Instructions (Signed)
Please refer to blue sheet if you have any questions.

## 2016-05-19 NOTE — Progress Notes (Signed)
Patient ID: Ricky Abbott, male   DOB: 19-Jul-1945, 71 y.o.   MRN: [id number]  HPI Ricky Abbott is a 71 y.o. male with a prior history of acute cholecystitis last month requiring percutaneous cholecystostomy tube. A patient of the time was some Plavix and cardiology has been consulted and they feel that he no longer needs the Plavix. He was clear from a cardiovascular perspective. Currently he is asymptomatic, denies any pain or fever. Cholecystostomy tube working adequately. He is taking by mouth. He is back to baseline, able to perform more than 4 MET w/o SOB or C/P  HPI  Past Medical History:  Diagnosis Date  . Allergy   . Asthma   . CAD, multiple vessel   . Diabetes mellitus without complication ([facility])   . History of chicken pox   . Hyperlipidemia   . Hypertension   . MI (myocardial infarction)   . PTSD (post-traumatic stress disorder)    [place] Vet  . Sarcoidosis of lung [facility])     Past Surgical History:  Procedure Laterality Date  . [place], [place]  . CARDIAC CATHETERIZATION     [facility]   . [place], 05/2012   x 2  . IR GENERIC HISTORICAL  04/30/2016   IR PERC CHOLECYSTOSTOMY 04/30/2016 [facility]-INTERV RAD    Family History  Problem Relation Age of Onset  . Cancer Mother   . Hyperlipidemia Mother   . Hypertension Mother   . Cancer Father     stomach  . Hyperlipidemia Father   . Hypertension Father   . Cancer Brother     bone cancer    Social History Social History  Substance Use Topics  . Smoking status: Former Smoker    Packs/day: 4.00    Years: 25.00    Types: Cigarettes    Quit date: 03/24/1981  . Smokeless tobacco: Never Used     Comment: quit 30 years asgo  . Alcohol use No    Allergies  Allergen Reactions  . Peanut-Containing Drug Products Swelling    Swelling, rash,     Current Outpatient Prescriptions  Medication Sig Dispense Refill  . amLODipine (NORVASC) 5 MG tablet Take 1  tablet (5 mg total) by mouth daily. 90 tablet 0  . aspirin 81 MG tablet Take 81 mg by mouth daily. Reported on 02/19/2015    . atorvastatin (LIPITOR) 80 MG tablet Take 1 tablet (80 mg total) by mouth daily. 90 tablet 1  . carvedilol (COREG) 12.5 MG tablet Take 1 tablet (12.5 mg total) by mouth 2 (two) times daily with a meal. 180 tablet 1  . LORazepam (ATIVAN) 1 MG tablet Take 1 tablet (1 mg total) by mouth at bedtime. 90 tablet 0  . nitroGLYCERIN (NITROSTAT) 0.4 MG SL tablet Place 1 tablet (0.4 mg total) under the tongue every 5 (five) minutes as needed for chest pain. 25 tablet 1  . omeprazole (PRILOSEC) 20 MG capsule Take 1 capsule (20 mg total) by mouth daily. 90 capsule 1  . oxyCODONE-acetaminophen (PERCOCET/ROXICET) 5-325 MG tablet Take 1-2 tablets by mouth every 4 (four) hours as needed for moderate pain or severe pain. 30 tablet 0  . sertraline (ZOLOFT) 100 MG tablet Take 1 tablet (100 mg total) by mouth daily. 90 tablet 1  . Sodium Chloride Flush (NORMAL SALINE FLUSH) 0.9 % SOLN     . sodium chloride irrigation (ARGYLE STERILE SALINE) 0.9 % irrigation Irrigate with 10 mLs as  directed daily. 100 mL 1  . Syringe, Disposable, (B-D SYRINGE LUER-LOK 10CC) 10 ML MISC USE TO FLUSH CATHETER WITH 10 ML SALINE DAILY 10 each 1  . tamsulosin (FLOMAX) 0.4 MG CAPS capsule Take 1 capsule (0.4 mg total) by mouth daily. 30 capsule 3   No current facility-administered medications for this visit.      Review of Systems A 10 point review of systems was asked and was negative except for the information on the HPI  Physical Exam Blood pressure (!) 194/95, pulse 98, temperature 97.8 F (36.6 C), temperature source Oral, height 5' 10"  (1.778 m), weight 86.6 kg (191 lb). CONSTITUTIONAL: NAD EYES: Pupils are equal, round, and reactive to light, Sclera are non-icteric. EARS, NOSE, MOUTH AND THROAT: The oropharynx is clear. The oral mucosa is pink and moist. Hearing is intact to voice. LYMPH NODES:  Lymph  nodes in the neck are normal. RESPIRATORY:  Lungs are clear. There is normal respiratory effort, with equal breath sounds bilaterally, and without pathologic use of accessory muscles. CARDIOVASCULAR: Heart is regular without murmurs, gallops, or rubs. GI: The abdomen is  soft, nontender, and nondistended. There are no palpable masses. There is no hepatosplenomegaly. cholecystostomy tube in place GU: Rectal deferred.   MUSCULOSKELETAL: Normal muscle strength and tone. No cyanosis or edema.   SKIN: Turgor is good and there are no pathologic skin lesions or ulcers. NEUROLOGIC: Motor and sensation is grossly normal. Cranial nerves are grossly intact. PSYCH:  Oriented to person, place and time. Affect is normal.  Data Reviewed  I have personally reviewed the patient's imaging, laboratory findings and medical records.    Assessment/Plan Patient with a history of acute cholecystitis status post cholecystectomy tube placement. He is clinically doing much better he is pain-free  And now is off the Plavix. We can safely perform a laparoscopic cholecystectomy in a few weeks. At that time we will remove the drain as well. The risks, benefits, complications, treatment options, and expected outcomes were discussed with the patient. The possibilities of bleeding, recurrent infection, finding a normal gallbladder, perforation of viscus organs, damage to surrounding structures, bile leak, abscess formation, needing a drain placed, the need for additional procedures, reaction to medication, pulmonary aspiration,  failure to diagnose a condition, the possible need to convert to an open procedure, and creating a complication requiring transfusion or operation were discussed with the patient. The patient and/or family concurred with the proposed plan, giving informed consent.     Caroleen Hamman, MD FACS General Surgeon 05/19/2016, 3:53 PM

## 2016-05-19 NOTE — Telephone Encounter (Signed)
Spoke with pt and information is documented in result notes message.

## 2016-05-19 NOTE — Telephone Encounter (Signed)
Pt called back returning your call. Thank you!  Call pt @ [phone]

## 2016-05-23 ENCOUNTER — Telehealth: Payer: Self-pay

## 2016-05-23 ENCOUNTER — Telehealth: Payer: Self-pay | Admitting: Cardiovascular Disease

## 2016-05-23 NOTE — Telephone Encounter (Signed)
Patient has been advised of Surgery Date as well as Pre-Admission appointment date, time, and location.  Surgery Date: 06/05/16  Pre-admit Appointment: 05/27/16 @ 1045am  Patient has been advised to call [phone] the day before surgery between 1-3pm to obtain arrival time.

## 2016-05-23 NOTE — Telephone Encounter (Signed)
Spoke with Ricky Abbott. At [facility] for CPT code [phone] and ICD Z87.19 Authorization not required. Reference number- [id number]

## 2016-05-23 NOTE — Telephone Encounter (Signed)
Call made to patient to verify surgery information. He accepted Surgery information and then stated, "I do not intend on dealing with your office after my surgery, I was very insulted because the girl at the front had to speak with her manager in regards to my copay before my visit. Then she came back and told me that I could be billed for this visit but I would need to have my copay for the future. This is completely unacceptable." I explained to the patient that this is standard practice. I accept responsibility for this and patient would indeed need a co-pay at his next visit if it is prior to his surgery. He was not happy to hear this at all. He thanked me for my time and reviewing surgery information with him and ended the call.

## 2016-05-23 NOTE — Telephone Encounter (Signed)
Received cardiac clearance for 4/26 laparoscopic cholecystectomy with Dr. Dahlia Byes at [facility]. Per Dr. Donivan Scull March 6 OV notes: "Atypical chest pain, presenting for several days at rest while he was eating crackers on his mattress. Symptoms resolved since he was started on beta blocker and losartan. Recommended he call us if he has recurrent symptoms. "  I s/w pt to follow up on chest pain. States he has had no pain since OV or his March 19-23 hospital admission for cholecystitis. States he has told everyone this but he keeps being asked about pain. Informed pt I am following up in order to have him cleared for cholecystectomy.  Pt expresses concerns w/Dr. Corlis Leak office regarding a conversation about paying a co-pay vs being billed.  States he was disrespected and after surgery, he has no intention of following up with that office.  Advised pt to express concerns to the office manager at [place].  He then expressed frustration about the health care industry and insurance companies.   He understands that we will await clearance from Dr. Rockey Situ and once received, will fax to Dr. Corlis Leak office. Pt agreeable with plan.   Clearance placed in MD basket.

## 2016-05-23 NOTE — Telephone Encounter (Signed)
Medical Clearance faxed to Dr.Teresa Tullo at this time.  Cardiac Clearance faxed to Dr. Ida Rogue at this time.

## 2016-05-26 ENCOUNTER — Telehealth: Payer: Self-pay

## 2016-05-26 NOTE — Telephone Encounter (Signed)
Cardiac Clearance obtained from [facility] at this time and will be scanned under Media.

## 2016-05-26 NOTE — Telephone Encounter (Signed)
Clearance has been signed Acceptable risk  no further testing needed

## 2016-05-26 NOTE — Telephone Encounter (Signed)
error 

## 2016-05-26 NOTE — Telephone Encounter (Signed)
Faxed to # provided. Original signed form faxed to [facility] @ [phone].

## 2016-05-26 NOTE — Telephone Encounter (Signed)
Medical Clearance obtained from [place] at this time and will be scanned under Media.

## 2016-05-27 ENCOUNTER — Encounter
Admission: RE | Admit: 2016-05-27 | Discharge: 2016-05-27 | Disposition: A | Discharge status: Home / Self Care | Payer: Medicare HMO | Source: Ambulatory Visit | Attending: Surgery | Admitting: Surgery

## 2016-05-27 DIAGNOSIS — Z01818 Encounter for other preprocedural examination: Secondary | ICD-10-CM | POA: Insufficient documentation

## 2016-05-27 HISTORY — DX: Anxiety disorder, unspecified: F41.9

## 2016-05-27 HISTORY — DX: Benign prostatic hyperplasia without lower urinary tract symptoms: N40.0

## 2016-05-27 HISTORY — DX: Major depressive disorder, single episode, unspecified: F32.9

## 2016-05-27 HISTORY — DX: Chronic obstructive pulmonary disease, unspecified: J44.9

## 2016-05-27 HISTORY — DX: Depression, unspecified: F32.A

## 2016-05-27 HISTORY — DX: Dyspnea, unspecified: R06.00

## 2016-05-27 HISTORY — DX: Gastro-esophageal reflux disease without esophagitis: K21.9

## 2016-05-27 MED ORDER — CHLORHEXIDINE GLUCONATE CLOTH 2 % EX PADS
6.0000 | MEDICATED_PAD | Freq: Once | CUTANEOUS | Status: DC
  Filled 2016-05-27: qty 6

## 2016-05-27 NOTE — Patient Instructions (Signed)
Your procedure is scheduled on: 06/05/16 Thurs Report to Same Day Surgery 2nd floor medical mall [facility] Entrance-take elevator on left to 2nd floor.  Check in with surgery information desk.) To find out your arrival time please call [phone] between 1PM - 3PM on 06/04/16 Wed  Remember: Instructions that are not followed completely may result in serious medical risk, up to and including death, or upon the discretion of your surgeon and anesthesiologist your surgery may need to be rescheduled.    _x___ 1. Do not eat food or drink liquids after midnight. No gum chewing or                              hard candies.     __x__ 2. No Alcohol for 24 hours before or after surgery.   __x__3. No Smoking for 24 prior to surgery.   ____  4. Bring all medications with you on the day of surgery if instructed.    __x__ 5. Notify your doctor if there is any change in your medical condition     (cold, fever, infections).     Do not wear jewelry, make-up, hairpins, clips or nail polish.  Do not wear lotions, powders, or perfumes. You may wear deodorant.  Do not shave 48 hours prior to surgery. Men may shave face and neck.  Do not bring valuables to the hospital.    [facility] is not responsible for any belongings or valuables.               Contacts, dentures or bridgework may not be worn into surgery.  Leave your suitcase in the car. After surgery it may be brought to your room.  For patients admitted to the hospital, discharge time is determined by your                       treatment team.   Patients discharged the day of surgery will not be allowed to drive home.  You will need someone to drive you home and stay with you the night of your procedure.    Please read over the following fact sheets that you were given:   [facility] Preparing for Surgery and or MRSA Information   _x___ Take anti-hypertensive (unless it includes a diuretic), cardiac, seizure, asthma,     anti-reflux and  psychiatric medicines. These include:  1. amLODipine (NORVASC)  2.atorvastatin (LIPITOR  3.carvedilol (COREG  4.omeprazole (PRILOSEC  5.  6.  ____Fleets enema or Magnesium Citrate as directed.   _x___ Use CHG Soap or sage wipes as directed on instruction sheet   ____ Use inhalers on the day of surgery and bring to hospital day of surgery  ____ Stop Metformin and Janumet 2 days prior to surgery.    ____ Take 1/2 of usual insulin dose the night before surgery and none on the morning     surgery.   _x___ Follow recommendations from Cardiologist, Pulmonologist or PCP regarding          stopping Aspirin, Coumadin, Pllavix ,Eliquis, Effient, or Pradaxa, and Pletal.  Stopped Plavix.  Stop Aspirin on 05/31/16  X____Stop Anti-inflammatories such as Advil, Aleve, Ibuprofen, Motrin, Naproxen, Naprosyn, Goodies powders or aspirin products. OK to take Tylenol and                          Celebrex.   _x___ Stop  supplements until after surgery.  But may continue Vitamin D, Vitamin B,       and multivitamin.   ____ Bring C-Pap to the hospital.

## 2016-05-27 NOTE — Pre-Procedure Instructions (Signed)
Cardiac clearance, by Dr Rockey Situ, on chart.

## 2016-06-04 MED ORDER — CEFAZOLIN SODIUM-DEXTROSE 2-4 GM/100ML-% IV SOLN
2.0000 g | INTRAVENOUS | Status: AC
  Administered 2016-06-05: 2 g via INTRAVENOUS

## 2016-06-05 ENCOUNTER — Encounter: Payer: Self-pay | Admitting: *Deleted

## 2016-06-05 ENCOUNTER — Encounter
Admission: RE | Disposition: A | Discharge status: Home / Self Care | Payer: Self-pay | Source: Ambulatory Visit | Attending: Surgery

## 2016-06-05 ENCOUNTER — Ambulatory Visit: Payer: Medicare HMO | Admitting: Anesthesiology

## 2016-06-05 ENCOUNTER — Ambulatory Visit
Admission: RE | Admit: 2016-06-05 | Discharge: 2016-06-05 | Disposition: A | Discharge status: Home / Self Care | Payer: Medicare HMO | Source: Ambulatory Visit | Attending: Surgery | Admitting: Surgery

## 2016-06-05 ENCOUNTER — Ambulatory Visit: Payer: Medicare HMO

## 2016-06-05 DIAGNOSIS — J449 Chronic obstructive pulmonary disease, unspecified: Secondary | ICD-10-CM | POA: Diagnosis not present

## 2016-06-05 DIAGNOSIS — F418 Other specified anxiety disorders: Secondary | ICD-10-CM | POA: Diagnosis not present

## 2016-06-05 DIAGNOSIS — E119 Type 2 diabetes mellitus without complications: Secondary | ICD-10-CM | POA: Diagnosis not present

## 2016-06-05 DIAGNOSIS — K819 Cholecystitis, unspecified: Secondary | ICD-10-CM | POA: Diagnosis not present

## 2016-06-05 DIAGNOSIS — N400 Enlarged prostate without lower urinary tract symptoms: Secondary | ICD-10-CM | POA: Diagnosis not present

## 2016-06-05 DIAGNOSIS — Z7902 Long term (current) use of antithrombotics/antiplatelets: Secondary | ICD-10-CM | POA: Diagnosis not present

## 2016-06-05 DIAGNOSIS — Z955 Presence of coronary angioplasty implant and graft: Secondary | ICD-10-CM | POA: Diagnosis not present

## 2016-06-05 DIAGNOSIS — I2510 Atherosclerotic heart disease of native coronary artery without angina pectoris: Secondary | ICD-10-CM | POA: Diagnosis not present

## 2016-06-05 DIAGNOSIS — K811 Chronic cholecystitis: Secondary | ICD-10-CM | POA: Diagnosis present

## 2016-06-05 DIAGNOSIS — I252 Old myocardial infarction: Secondary | ICD-10-CM | POA: Insufficient documentation

## 2016-06-05 DIAGNOSIS — Z7982 Long term (current) use of aspirin: Secondary | ICD-10-CM | POA: Insufficient documentation

## 2016-06-05 DIAGNOSIS — I10 Essential (primary) hypertension: Secondary | ICD-10-CM | POA: Diagnosis not present

## 2016-06-05 DIAGNOSIS — E785 Hyperlipidemia, unspecified: Secondary | ICD-10-CM | POA: Insufficient documentation

## 2016-06-05 DIAGNOSIS — Z79899 Other long term (current) drug therapy: Secondary | ICD-10-CM | POA: Diagnosis not present

## 2016-06-05 DIAGNOSIS — Z87891 Personal history of nicotine dependence: Secondary | ICD-10-CM | POA: Insufficient documentation

## 2016-06-05 DIAGNOSIS — K8010 Calculus of gallbladder with chronic cholecystitis without obstruction: Secondary | ICD-10-CM | POA: Insufficient documentation

## 2016-06-05 DIAGNOSIS — D860 Sarcoidosis of lung: Secondary | ICD-10-CM | POA: Diagnosis not present

## 2016-06-05 DIAGNOSIS — K8020 Calculus of gallbladder without cholecystitis without obstruction: Secondary | ICD-10-CM | POA: Diagnosis not present

## 2016-06-05 DIAGNOSIS — K219 Gastro-esophageal reflux disease without esophagitis: Secondary | ICD-10-CM | POA: Insufficient documentation

## 2016-06-05 HISTORY — PX: CHOLECYSTECTOMY: SHX55

## 2016-06-05 LAB — GLUCOSE, CAPILLARY
GLUCOSE-CAPILLARY: 124 mg/dL — AB (ref 65–99)
Glucose-Capillary: 132 mg/dL — ABNORMAL HIGH (ref 65–99)

## 2016-06-05 SURGERY — LAPAROSCOPIC CHOLECYSTECTOMY
Anesthesia: General | Wound class: Clean Contaminated

## 2016-06-05 MED ORDER — DEXAMETHASONE SODIUM PHOSPHATE 10 MG/ML IJ SOLN
INTRAMUSCULAR | Status: DC | PRN
  Administered 2016-06-05: 10 mg via INTRAVENOUS

## 2016-06-05 MED ORDER — PROMETHAZINE HCL 25 MG/ML IJ SOLN
6.2500 mg | INTRAMUSCULAR | Status: DC | PRN
  Administered 2016-06-05: 12.5 mg via INTRAVENOUS

## 2016-06-05 MED ORDER — ROCURONIUM BROMIDE 100 MG/10ML IV SOLN
INTRAVENOUS | Status: DC | PRN
  Administered 2016-06-05: 10 mg via INTRAVENOUS
  Administered 2016-06-05: 30 mg via INTRAVENOUS

## 2016-06-05 MED ORDER — SODIUM CHLORIDE 0.9 % IJ SOLN
INTRAMUSCULAR | Status: AC
  Filled 2016-06-05: qty 20

## 2016-06-05 MED ORDER — IOPAMIDOL (ISOVUE-300) INJECTION 61%
INTRAVENOUS | Status: DC | PRN
  Administered 2016-06-05: 15 mL

## 2016-06-05 MED ORDER — DEXAMETHASONE SODIUM PHOSPHATE 10 MG/ML IJ SOLN
INTRAMUSCULAR | Status: AC
  Filled 2016-06-05: qty 1

## 2016-06-05 MED ORDER — OXYCODONE HCL 5 MG PO TABS: 5.0000 mg | ORAL_TABLET | Freq: Once | ORAL | Status: DC | PRN

## 2016-06-05 MED ORDER — ACETAMINOPHEN 10 MG/ML IV SOLN
INTRAVENOUS | Status: DC | PRN
  Administered 2016-06-05: 1000 mg via INTRAVENOUS

## 2016-06-05 MED ORDER — ONDANSETRON HCL 4 MG/2ML IJ SOLN
INTRAMUSCULAR | Status: DC | PRN
  Administered 2016-06-05: 4 mg via INTRAVENOUS

## 2016-06-05 MED ORDER — PROMETHAZINE HCL 25 MG/ML IJ SOLN
INTRAMUSCULAR | Status: AC
  Filled 2016-06-05: qty 1

## 2016-06-05 MED ORDER — ACETAMINOPHEN 10 MG/ML IV SOLN
INTRAVENOUS | Status: AC
  Filled 2016-06-05: qty 100

## 2016-06-05 MED ORDER — CEFAZOLIN SODIUM-DEXTROSE 2-4 GM/100ML-% IV SOLN
INTRAVENOUS | Status: AC
  Administered 2016-06-05: 2 g via INTRAVENOUS
  Filled 2016-06-05: qty 100

## 2016-06-05 MED ORDER — MEPERIDINE HCL 50 MG/ML IJ SOLN: 6.2500 mg | INTRAMUSCULAR | Status: DC | PRN

## 2016-06-05 MED ORDER — FENTANYL CITRATE (PF) 100 MCG/2ML IJ SOLN
INTRAMUSCULAR | Status: DC | PRN
Start: 2016-06-05 — End: 2016-06-05
  Administered 2016-06-05: 50 ug via INTRAVENOUS
  Administered 2016-06-05: 100 ug via INTRAVENOUS
  Administered 2016-06-05: 50 ug via INTRAVENOUS

## 2016-06-05 MED ORDER — KETOROLAC TROMETHAMINE 30 MG/ML IJ SOLN
INTRAMUSCULAR | Status: AC
  Filled 2016-06-05: qty 1

## 2016-06-05 MED ORDER — FENTANYL CITRATE (PF) 100 MCG/2ML IJ SOLN
INTRAMUSCULAR | Status: AC
  Filled 2016-06-05: qty 2

## 2016-06-05 MED ORDER — ATROPINE SULFATE 0.4 MG/ML IJ SOLN
INTRAMUSCULAR | Status: DC | PRN
  Administered 2016-06-05: 0.4 mg via INTRAVENOUS

## 2016-06-05 MED ORDER — SODIUM CHLORIDE 0.9 % IV SOLN
INTRAVENOUS | Status: DC
  Administered 2016-06-05: 12:00:00 via INTRAVENOUS

## 2016-06-05 MED ORDER — OXYCODONE HCL 5 MG/5ML PO SOLN: 5.0000 mg | Freq: Once | ORAL | Status: DC | PRN

## 2016-06-05 MED ORDER — SUGAMMADEX SODIUM 200 MG/2ML IV SOLN
INTRAVENOUS | Status: AC
  Filled 2016-06-05: qty 2

## 2016-06-05 MED ORDER — SUGAMMADEX SODIUM 200 MG/2ML IV SOLN
INTRAVENOUS | Status: DC | PRN
  Administered 2016-06-05: 170 mg via INTRAVENOUS

## 2016-06-05 MED ORDER — KETOROLAC TROMETHAMINE 30 MG/ML IJ SOLN
INTRAMUSCULAR | Status: DC | PRN
  Administered 2016-06-05: 15 mg via INTRAVENOUS

## 2016-06-05 MED ORDER — HYDROCODONE-ACETAMINOPHEN 5-325 MG PO TABS: 1.0000 | ORAL_TABLET | ORAL | 0 refills | Status: DC | PRN

## 2016-06-05 MED ORDER — FENTANYL CITRATE (PF) 100 MCG/2ML IJ SOLN
INTRAMUSCULAR | Status: AC
  Administered 2016-06-05: 50 ug via INTRAVENOUS
  Filled 2016-06-05: qty 2

## 2016-06-05 MED ORDER — BUPIVACAINE-EPINEPHRINE (PF) 0.25% -1:200000 IJ SOLN
INTRAMUSCULAR | Status: AC
  Filled 2016-06-05: qty 30

## 2016-06-05 MED ORDER — ONDANSETRON HCL 4 MG/2ML IJ SOLN
INTRAMUSCULAR | Status: AC
  Filled 2016-06-05: qty 2

## 2016-06-05 MED ORDER — FENTANYL CITRATE (PF) 100 MCG/2ML IJ SOLN
25.0000 ug | INTRAMUSCULAR | Status: DC | PRN
  Administered 2016-06-05 (×3): 50 ug via INTRAVENOUS

## 2016-06-05 MED ORDER — PROPOFOL 10 MG/ML IV BOLUS
INTRAVENOUS | Status: DC | PRN
  Administered 2016-06-05: 150 mg via INTRAVENOUS

## 2016-06-05 SURGICAL SUPPLY — 47 items
APPLICATOR COTTON TIP 6IN STRL (MISCELLANEOUS) IMPLANT
APPLIER CLIP 5 13 M/L LIGAMAX5 (MISCELLANEOUS) ×2
BLADE SURG 15 STRL LF DISP TIS (BLADE) ×1 IMPLANT
BLADE SURG 15 STRL SS (BLADE) ×1
CANISTER SUCT 1200ML W/VALVE (MISCELLANEOUS) ×2 IMPLANT
CHLORAPREP W/TINT 26ML (MISCELLANEOUS) ×2 IMPLANT
CHOLANGIOGRAM CATH TAUT (CATHETERS) ×2 IMPLANT
CLEANER CAUTERY TIP 5X5 PAD (MISCELLANEOUS) ×1 IMPLANT
CLIP APPLIE 5 13 M/L LIGAMAX5 (MISCELLANEOUS) ×1 IMPLANT
DECANTER SPIKE VIAL GLASS SM (MISCELLANEOUS) IMPLANT
DERMABOND ADVANCED (GAUZE/BANDAGES/DRESSINGS) ×1
DERMABOND ADVANCED .7 DNX12 (GAUZE/BANDAGES/DRESSINGS) ×1 IMPLANT
DEVICE TROCAR PUNCTURE CLOSURE (ENDOMECHANICALS) IMPLANT
DRAPE C-ARM XRAY 36X54 (DRAPES) ×2 IMPLANT
ELECT CAUTERY BLADE 6.4 (BLADE) ×2 IMPLANT
ELECT REM PT RETURN 9FT ADLT (ELECTROSURGICAL) ×2
ELECTRODE REM PT RTRN 9FT ADLT (ELECTROSURGICAL) ×1 IMPLANT
ENDOPOUCH RETRIEVER 10 (MISCELLANEOUS) ×2 IMPLANT
GLOVE BIO SURGEON STRL SZ7 (GLOVE) ×10 IMPLANT
GOWN STRL REUS W/ TWL LRG LVL3 (GOWN DISPOSABLE) ×3 IMPLANT
GOWN STRL REUS W/TWL LRG LVL3 (GOWN DISPOSABLE) ×3
IRRIGATION STRYKERFLOW (MISCELLANEOUS) ×1 IMPLANT
IRRIGATOR STRYKERFLOW (MISCELLANEOUS) ×2
IV CATH ANGIO 12GX3 LT BLUE (NEEDLE) ×2 IMPLANT
IV NS 1000ML (IV SOLUTION) ×1
IV NS 1000ML BAXH (IV SOLUTION) ×1 IMPLANT
L-HOOK LAP DISP 36CM (ELECTROSURGICAL) ×2
LHOOK LAP DISP 36CM (ELECTROSURGICAL) ×1 IMPLANT
NEEDLE HYPO 22GX1.5 SAFETY (NEEDLE) ×2 IMPLANT
PACK LAP CHOLECYSTECTOMY (MISCELLANEOUS) ×2 IMPLANT
PAD CLEANER CAUTERY TIP 5X5 (MISCELLANEOUS) ×1
PENCIL ELECTRO HAND CTR (MISCELLANEOUS) ×2 IMPLANT
SCISSORS METZENBAUM CVD 33 (INSTRUMENTS) ×2 IMPLANT
SLEEVE ENDOPATH XCEL 5M (ENDOMECHANICALS) ×4 IMPLANT
SOL ANTI-FOG 6CC FOG-OUT (MISCELLANEOUS) ×1 IMPLANT
SOL FOG-OUT ANTI-FOG 6CC (MISCELLANEOUS) ×1
SPONGE LAP 18X18 5 PK (GAUZE/BANDAGES/DRESSINGS) IMPLANT
STOPCOCK 3 WAY  REPLAC (MISCELLANEOUS) ×2 IMPLANT
SUT ETHIBOND 0 MO6 C/R (SUTURE) IMPLANT
SUT MNCRL AB 4-0 PS2 18 (SUTURE) ×2 IMPLANT
SUT VIC AB 0 CT2 27 (SUTURE) IMPLANT
SUT VICRYL 0 AB UR-6 (SUTURE) ×4 IMPLANT
SYR 20CC LL (SYRINGE) ×2 IMPLANT
TROCAR XCEL BLUNT TIP 100MML (ENDOMECHANICALS) ×2 IMPLANT
TROCAR XCEL NON-BLD 5MMX100MML (ENDOMECHANICALS) ×2 IMPLANT
TUBING INSUFFLATOR HI FLOW (MISCELLANEOUS) ×2 IMPLANT
WATER STERILE IRR 1000ML POUR (IV SOLUTION) ×2 IMPLANT

## 2016-06-05 NOTE — OR Nursing (Signed)
Patient does not have anyone to be at home with him.He lives in an apartment and does not know any of his neighbors. He does live on the first floor. His cousin that is here with him will only get him home and into the apartment.

## 2016-06-05 NOTE — Anesthesia Preprocedure Evaluation (Addendum)
Anesthesia Evaluation  Patient identified by MRN, date of birth, ID band Patient awake    Reviewed: Allergy & Precautions, NPO status , Patient's Chart, lab work & pertinent test results  History of Anesthesia Complications Negative for: history of anesthetic complications  Airway Mallampati: II  TM Distance: >3 FB Neck ROM: Full    Dental  (+) Poor Dentition, Missing, Loose,    Pulmonary asthma , COPD,  COPD inhaler, former smoker,    breath sounds clear to auscultation- rhonchi (-) wheezing      Cardiovascular hypertension, Pt. on medications + CAD, + Past MI, + Cardiac Stents (2014) and + Peripheral Vascular Disease (s/p AAA stent)   Rhythm:Regular Rate:Normal - Systolic murmurs and - Diastolic murmurs    Neuro/Psych PSYCHIATRIC DISORDERS Anxiety Depression negative neurological ROS     GI/Hepatic GERD  ,(+) Hepatitis -, B, C  Endo/Other  diabetes (diet controlled)  Renal/GU negative Renal ROS     Musculoskeletal  (+) Arthritis ,   Abdominal (+) - obese,   Peds  Hematology negative hematology ROS (+)   Anesthesia Other Findings Past Medical History: No date: Allergy No date: Anxiety No date: Asthma No date: CAD, multiple vessel No date: COPD (chronic obstructive pulmonary disease) (* No date: Depression No date: Diabetes mellitus without complication (HCC) No date: Dyspnea No date: GERD (gastroesophageal reflux disease) No date: History of chicken pox No date: Hyperlipidemia No date: Hypertension No date: MI (myocardial infarction) ([place]) No date: Prostate enlargement No date: PTSD (post-traumatic stress disorder)     Comment: [place] Vet No date: Sarcoidosis of lung (HCC)   Reproductive/Obstetrics                            Anesthesia Physical Anesthesia Plan  ASA: III  Anesthesia Plan: General   Post-op Pain Management:    Induction: Intravenous  Airway Management  Planned: Oral ETT  Additional Equipment:   Intra-op Plan:   Post-operative Plan: Extubation in OR  Informed Consent: I have reviewed the patients History and Physical, chart, labs and discussed the procedure including the risks, benefits and alternatives for the proposed anesthesia with the patient or authorized representative who has indicated his/her understanding and acceptance.   Dental advisory given  Plan Discussed with: CRNA and Anesthesiologist  Anesthesia Plan Comments:         Anesthesia Quick Evaluation

## 2016-06-05 NOTE — Anesthesia Procedure Notes (Signed)
Procedure Name: Intubation Date/Time: 06/05/2016 1:34 PM Performed by: Jonna Clark Pre-anesthesia Checklist: Patient identified, Patient being monitored, Timeout performed, Emergency Drugs available and Suction available Patient Re-evaluated:Patient Re-evaluated prior to inductionOxygen Delivery Method: Circle system utilized Preoxygenation: Pre-oxygenation with 100% oxygen Intubation Type: IV induction Ventilation: Mask ventilation without difficulty Laryngoscope Size: Mac and 3 Grade View: Grade I Tube type: Oral Tube size: 7.5 mm Number of attempts: 1 Placement Confirmation: ETT inserted through vocal cords under direct vision,  positive ETCO2 and breath sounds checked- equal and bilateral Secured at: 21 cm Tube secured with: Tape Dental Injury: Teeth and Oropharynx as per pre-operative assessment

## 2016-06-05 NOTE — Interval H&P Note (Signed)
History and Physical Interval Note:  06/05/2016 12:20 PM  Ricky Abbott  has presented today for surgery, with the diagnosis of cholecystitis  The various methods of treatment have been discussed with the patient and family. After consideration of risks, benefits and other options for treatment, the patient has consented to  Procedure(s): LAPAROSCOPIC CHOLECYSTECTOMY (N/A) as a surgical intervention .  The patient's history has been reviewed, patient examined, no change in status, stable for surgery.  I have reviewed the patient's chart and labs.  Questions were answered to the patient's satisfaction.     [place]

## 2016-06-05 NOTE — Discharge Instructions (Signed)
AMBULATORY SURGERY  DISCHARGE INSTRUCTIONS   1) The drugs that you were given will stay in your system until tomorrow so for the next 24 hours you should not:  A) Drive an automobile B) Make any legal decisions C) Drink any alcoholic beverage   2) You may resume regular meals tomorrow.  Today it is better to start with liquids and gradually work up to solid foods.  You may eat anything you prefer, but it is better to start with liquids, then soup and crackers, and gradually work up to solid foods.   3) Please notify your doctor immediately if you have any unusual bleeding, trouble breathing, redness and pain at the surgery site, drainage, fever, or pain not relieved by medication.    4) Additional Instructions:STOOL SOFTENERS WHEN TAKING NARCOTICS   Please contact your physician with any problems or Same Day Surgery at [phone], Monday through Friday 6 am to 4 pm, or [place] at [facility] number at [phone].

## 2016-06-05 NOTE — Op Note (Signed)
Laparoscopic Cholecystectomy  Pre-operative Diagnosis: Chronic cholecystitis  Post-operative Diagnosis: *same  Procedure: Laparoscopic with intraoperative cholangiogram Removal of cholecystostomy tube   Surgeon: Caroleen Hamman, MD FACS  Anesthesia: Gen. with endotracheal tube  Findings: chronic Cholecystitis  Normal cholangiogram. No evidence of obstructive process. Contrast goes all the way to the duodenum.  Estimated Blood Loss: 10cc         Drains: none         Specimens: Gallbladder           Complications: none   Procedure Details  The patient was seen again in the Holding Room. The benefits, complications, treatment options, and expected outcomes were discussed with the patient. The risks of bleeding, infection, recurrence of symptoms, failure to resolve symptoms, bile duct damage, bile duct leak, retained common bile duct stone, bowel injury, any of which could require further surgery and/or ERCP, stent, or papillotomy were reviewed with the patient. The likelihood of improving the patient's symptoms with return to their baseline status is good.  The patient and/or family concurred with the proposed plan, giving informed consent.  The patient was taken to Operating Room, identified as Ricky Abbott and the procedure verified as Laparoscopic Cholecystectomy.  A Time Out was held and the above information confirmed.  Prior to the induction of general anesthesia, antibiotic prophylaxis was administered. VTE prophylaxis was in place. General endotracheal anesthesia was then administered and tolerated well. After the induction, the abdomen was prepped with Chloraprep and draped in the sterile fashion. The patient was positioned in the supine position. We started with a cholangiogram twice a day cholecystostomy tube for strength Isovue was injected through the catheter in the standard fashion. Multiple fluoroscopic views were performed showing widely patent cystic duct and common  bile duct. Without any evidence of feeling defects. Contrast went all the way to the hepatic radicles proximally and in an antegrade fashion to the duodenum. We proceeded with our  Supraumbilical incision, a Cut down technique was used to enter the abdominal cavity and a Hasson trochar was placed after two vicryl stitches were anchored to the fascia. Pneumoperitoneum was then created with CO2 and tolerated well without any adverse changes in the patient's vital signs.  Three 5-mm ports were placed in the right upper quadrant all under direct vision. All skin incisions  were infiltrated with a local anesthetic agent before making the incision and placing the trocars.   The patient was positioned  in reverse Trendelenburg, tilted slightly to the patient's left.  The gallbladder was identified, the fundus grasped and retracted cephalad. Adhesions were lysed bluntly. The infundibulum was grasped and retracted laterally, exposing the peritoneum overlying the triangle of Calot. This was then divided and exposed in a blunt fashion. An extended critical view of the cystic duct and cystic artery was obtained.  The cystic duct was clearly identified and bluntly dissected.   Artery and duct were double clipped and divided.  The gallbladder was taken from the gallbladder fossa in a retrograde fashion with the electrocautery. The gallbladder was removed and placed in an Endocatch bag. We also directly visualized the cholecystostomy tube and removed it in the standard fashion after cutting the anchoring stitches.  The liver bed was irrigated and inspected. Hemostasis was achieved with the electrocautery. Irrigation was utilized and was repeatedly aspirated until clear.  The gallbladder and Endocatch sac were then removed through a port site.   Inspection of the right upper quadrant was performed. No bleeding, bile duct injury or  leak, or bowel injury was noted. Pneumoperitoneum was released.  The periumbilical port site  was closed with interrumpted 0 Vicryl sutures. 4-0 subcuticular Monocryl was used to close the skin. Dermabond was  applied.  The patient was then extubated and brought to the recovery room in stable condition. Sponge, lap, and needle counts were correct at closure and at the conclusion of the case.               Caroleen Hamman, MD, FACS

## 2016-06-05 NOTE — Anesthesia Postprocedure Evaluation (Signed)
Anesthesia Post Note  Patient: Ricky Abbott  Procedure(s) Performed: Procedure(s) (LRB): LAPAROSCOPIC CHOLECYSTECTOMY with cholangiogram (N/A)  Patient location during evaluation: PACU Anesthesia Type: General Level of consciousness: awake and alert Pain management: pain level controlled Vital Signs Assessment: post-procedure vital signs reviewed and stable Respiratory status: spontaneous breathing, nonlabored ventilation, respiratory function stable and patient connected to nasal cannula oxygen Cardiovascular status: blood pressure returned to baseline and stable Postop Assessment: no signs of nausea or vomiting Anesthetic complications: no     Last Vitals:  Vitals:   06/05/16 1530 06/05/16 1550  BP: 127/64 140/69  Pulse: 67 62  Resp: 16 18  Temp:  37 C    Last Pain:  Vitals:   06/05/16 1530  TempSrc:   PainSc: 2                  Sherice Ijames S

## 2016-06-05 NOTE — Transfer of Care (Signed)
Immediate Anesthesia Transfer of Care Note  Patient: Ricky Abbott  Procedure(s) Performed: Procedure(s): LAPAROSCOPIC CHOLECYSTECTOMY with cholangiogram (N/A)  Patient Location: PACU  Anesthesia Type:General  Level of Consciousness: awake, alert  and oriented  Airway & Oxygen Therapy: Patient Spontanous Breathing and Patient connected to face mask oxygen  Post-op Assessment: Report given to RN and Post -op Vital signs reviewed and stable  Post vital signs: Reviewed and stable  Last Vitals:  Vitals:   06/05/16 1131 06/05/16 1454  BP: 130/68 90/75  Pulse: 80 74  Resp: 18 14  Temp: 36.6 C     Last Pain:  Vitals:   06/05/16 1131  TempSrc: Oral         Complications: No apparent anesthesia complications

## 2016-06-05 NOTE — OR Nursing (Signed)
Reviewed all instructions with patient and cousin. Discussed several times the importance of safety once home and what to do for pain, etc.  Patient instructed to call 911 in any emergent situation. Both state they understand instructions provided verbally and in writing.

## 2016-06-05 NOTE — H&P (View-Only) (Signed)
Patient ID: Ricky Abbott, male   DOB: 05-07-1945, 71 y.o.   MRN: [id number]  HPI Ricky Abbott is a 71 y.o. male with a prior history of acute cholecystitis last month requiring percutaneous cholecystostomy tube. A patient of the time was some Plavix and cardiology has been consulted and they feel that he no longer needs the Plavix. He was clear from a cardiovascular perspective. Currently he is asymptomatic, denies any pain or fever. Cholecystostomy tube working adequately. He is taking by mouth. He is back to baseline, able to perform more than 4 MET w/o SOB or C/P  HPI  Past Medical History:  Diagnosis Date  . Allergy   . Asthma   . CAD, multiple vessel   . Diabetes mellitus without complication ([place])   . History of chicken pox   . Hyperlipidemia   . Hypertension   . MI (myocardial infarction)   . PTSD (post-traumatic stress disorder)    [place] [facility]  . Sarcoidosis of lung [facility])     Past Surgical History:  Procedure Laterality Date  . [place], [place]  . CARDIAC CATHETERIZATION     [facility]   . [place], 05/2012   x 2  . IR GENERIC HISTORICAL  04/30/2016   IR PERC CHOLECYSTOSTOMY 04/30/2016 [facility]-INTERV RAD    Family History  Problem Relation Age of Onset  . Cancer Mother   . Hyperlipidemia Mother   . Hypertension Mother   . Cancer Father     stomach  . Hyperlipidemia Father   . Hypertension Father   . Cancer Brother     bone cancer    Social History Social History  Substance Use Topics  . Smoking status: Former Smoker    Packs/day: 4.00    Years: 25.00    Types: Cigarettes    Quit date: 03/24/1981  . Smokeless tobacco: Never Used     Comment: quit 30 years asgo  . Alcohol use No    Allergies  Allergen Reactions  . Peanut-Containing Drug Products Swelling    Swelling, rash,     Current Outpatient Prescriptions  Medication Sig Dispense Refill  . amLODipine (NORVASC) 5 MG tablet Take 1  tablet (5 mg total) by mouth daily. 90 tablet 0  . aspirin 81 MG tablet Take 81 mg by mouth daily. Reported on 02/19/2015    . atorvastatin (LIPITOR) 80 MG tablet Take 1 tablet (80 mg total) by mouth daily. 90 tablet 1  . carvedilol (COREG) 12.5 MG tablet Take 1 tablet (12.5 mg total) by mouth 2 (two) times daily with a meal. 180 tablet 1  . LORazepam (ATIVAN) 1 MG tablet Take 1 tablet (1 mg total) by mouth at bedtime. 90 tablet 0  . nitroGLYCERIN (NITROSTAT) 0.4 MG SL tablet Place 1 tablet (0.4 mg total) under the tongue every 5 (five) minutes as needed for chest pain. 25 tablet 1  . omeprazole (PRILOSEC) 20 MG capsule Take 1 capsule (20 mg total) by mouth daily. 90 capsule 1  . oxyCODONE-acetaminophen (PERCOCET/ROXICET) 5-325 MG tablet Take 1-2 tablets by mouth every 4 (four) hours as needed for moderate pain or severe pain. 30 tablet 0  . sertraline (ZOLOFT) 100 MG tablet Take 1 tablet (100 mg total) by mouth daily. 90 tablet 1  . Sodium Chloride Flush (NORMAL SALINE FLUSH) 0.9 % SOLN     . sodium chloride irrigation (ARGYLE STERILE SALINE) 0.9 % irrigation Irrigate with 10 mLs as  directed daily. 100 mL 1  . Syringe, Disposable, (B-D SYRINGE LUER-LOK 10CC) 10 ML MISC USE TO FLUSH CATHETER WITH 10 ML SALINE DAILY 10 each 1  . tamsulosin (FLOMAX) 0.4 MG CAPS capsule Take 1 capsule (0.4 mg total) by mouth daily. 30 capsule 3   No current facility-administered medications for this visit.      Review of Systems A 10 point review of systems was asked and was negative except for the information on the HPI  Physical Exam Blood pressure (!) 194/95, pulse 98, temperature 97.8 F (36.6 C), temperature source Oral, height 5' 10"  (1.778 m), weight 86.6 kg (191 lb). CONSTITUTIONAL: NAD EYES: Pupils are equal, round, and reactive to light, Sclera are non-icteric. EARS, NOSE, MOUTH AND THROAT: The oropharynx is clear. The oral mucosa is pink and moist. Hearing is intact to voice. LYMPH NODES:  Lymph  nodes in the neck are normal. RESPIRATORY:  Lungs are clear. There is normal respiratory effort, with equal breath sounds bilaterally, and without pathologic use of accessory muscles. CARDIOVASCULAR: Heart is regular without murmurs, gallops, or rubs. GI: The abdomen is  soft, nontender, and nondistended. There are no palpable masses. There is no hepatosplenomegaly. cholecystostomy tube in place GU: Rectal deferred.   MUSCULOSKELETAL: Normal muscle strength and tone. No cyanosis or edema.   SKIN: Turgor is good and there are no pathologic skin lesions or ulcers. NEUROLOGIC: Motor and sensation is grossly normal. Cranial nerves are grossly intact. PSYCH:  Oriented to person, place and time. Affect is normal.  Data Reviewed  I have personally reviewed the patient's imaging, laboratory findings and medical records.    Assessment/Plan Patient with a history of acute cholecystitis status post cholecystectomy tube placement. He is clinically doing much better he is pain-free  And now is off the Plavix. We can safely perform a laparoscopic cholecystectomy in a few weeks. At that time we will remove the drain as well. The risks, benefits, complications, treatment options, and expected outcomes were discussed with the patient. The possibilities of bleeding, recurrent infection, finding a normal gallbladder, perforation of viscus organs, damage to surrounding structures, bile leak, abscess formation, needing a drain placed, the need for additional procedures, reaction to medication, pulmonary aspiration,  failure to diagnose a condition, the possible need to convert to an open procedure, and creating a complication requiring transfusion or operation were discussed with the patient. The patient and/or family concurred with the proposed plan, giving informed consent.     Caroleen Hamman, MD FACS General Surgeon 05/19/2016, 3:53 PM

## 2016-06-05 NOTE — Anesthesia Post-op Follow-up Note (Cosign Needed)
Anesthesia QCDR form completed.        

## 2016-06-06 ENCOUNTER — Encounter: Payer: Self-pay | Admitting: Surgery

## 2016-06-06 ENCOUNTER — Telehealth: Payer: Self-pay | Admitting: Surgery

## 2016-06-06 NOTE — Telephone Encounter (Signed)
Patient would like to know if he needs to resume Plavix. Had surgery pm 06/06/16. Please advise

## 2016-06-06 NOTE — Telephone Encounter (Signed)
It was explained to patient that he may restart his Plavix today as he was told at discharge from surgery yesterday. He verbalizes understanding.

## 2016-06-09 LAB — SURGICAL PATHOLOGY

## 2016-06-12 ENCOUNTER — Ambulatory Visit (INDEPENDENT_AMBULATORY_CARE_PROVIDER_SITE_OTHER): Payer: Medicare HMO | Admitting: Surgery

## 2016-06-12 ENCOUNTER — Telehealth: Payer: Self-pay

## 2016-06-12 ENCOUNTER — Other Ambulatory Visit: Admission: RE | Admit: 2016-06-12 | Payer: Medicare HMO | Source: Ambulatory Visit | Admitting: *Deleted

## 2016-06-12 DIAGNOSIS — R197 Diarrhea, unspecified: Secondary | ICD-10-CM
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
## 2016-06-12 NOTE — Progress Notes (Signed)
s/p lap chole w IOC Doing well overall Main issues is diarrhea. Of note he did have diarrhea even before his lap chole and 1 month ago had C diff tested by his PCP and was negative Apparently he has had some incontinence issues as well No fevers, + PO , no emesis, pain well controlled  PE NAD Abd: soft, NT, incisions c/d/I, healing well, no infection or peritonitis  A/P Doing well Diarrhea re check c diff. HE did have diarrhea even before the procedure. If C diff negative will add imodium, if it is not controlled by Imodium may need to add Cholestyramine. RTC 2 wks

## 2016-06-12 NOTE — Patient Instructions (Addendum)
Please go to the [facility] and have your stool test completed. I will call you with the results and if they are negative you may begin taking Imodium per instructions on packaging. You may buy this at any drugs store but do not begin taking this until you here that testing is negative.  Directions to Medical Mall: When leaving our office, go right. Go all of the way down to the very end of the hallway. You will have a purple wall in front of you. You will now have a tunnel to the hospital on your left hand side. Go through this tunnel and the elevators will be on your left. Go down to the 1st floor and take a slight left. The very first desk on the right hand side is the registration desk.  You may begin the exercises below to help with stool incontinence.  We will see you back in 2 weeks to see how you are doing. Please see appointment below.  If you have any questions or concerns prior to your next scheduled appointment, please call our office.    Kegel Exercises Kegel exercises help strengthen the muscles that support the rectum, small intestine, and bladder. Doing Kegel exercises can help:  Improve bladder and bowel control.  Improve sexual response.  Reduce problems and discomfort during pregnancy. Kegel exercises involve squeezing your pelvic floor muscles, which are the same muscles you squeeze when you try to stop the flow of urine. The exercises can be done while sitting, standing, or lying down, but it is best to vary your position. Phase 1 exercises 1. Squeeze your pelvic floor muscles tight. You should feel a tight lift in your rectal area. Keep your stomach, buttocks, and legs relaxed. 2. Hold the muscles tight for up to 10 seconds. 3. Relax your muscles. Repeat this exercise 50 times a day or as many times as told by your health care provider. Continue to do this exercise for at least 4-6 weeks or for as long as told by your health care provider. This information is not  intended to replace advice given to you by your health care provider. Make sure you discuss any questions you have with your health care provider. Document Released: 01/14/2012 Document Revised: 09/22/2015 Document Reviewed: 12/17/2014 Elsevier Interactive Patient Education  2017 [employer].

## 2016-06-25 ENCOUNTER — Encounter: Payer: Self-pay | Admitting: Surgery

## 2016-07-17 ENCOUNTER — Encounter: Payer: Self-pay | Admitting: Surgery

## 2016-07-28 ENCOUNTER — Telehealth: Payer: Self-pay | Admitting: *Deleted

## 2016-07-28 DIAGNOSIS — R05 Cough: Secondary | ICD-10-CM

## 2016-07-28 DIAGNOSIS — R059 Cough, unspecified: Secondary | ICD-10-CM

## 2016-07-28 MED ORDER — AMLODIPINE BESYLATE 5 MG PO TABS: 5.0000 mg | ORAL_TABLET | Freq: Every day | ORAL | 0 refills | Status: DC

## 2016-07-28 MED ORDER — OMEPRAZOLE 20 MG PO CPDR: 20.0000 mg | DELAYED_RELEASE_CAPSULE | Freq: Every day | ORAL | 0 refills | Status: DC

## 2016-07-28 MED ORDER — CLOPIDOGREL BISULFATE 75 MG PO TABS: 75.0000 mg | ORAL_TABLET | Freq: Every day | ORAL | 0 refills | Status: DC

## 2016-07-28 MED ORDER — LORAZEPAM 1 MG PO TABS: 1.0000 mg | ORAL_TABLET | Freq: Every day | ORAL | 0 refills | Status: DC

## 2016-07-28 NOTE — Telephone Encounter (Signed)
Rx has been printed, signed and faxed.

## 2016-07-28 NOTE — Telephone Encounter (Signed)
Refilled: 01/14/2016 Last OV: 05/15/2016 Next OV: not scheduled   Other medications have already been refilled.

## 2016-07-28 NOTE — Telephone Encounter (Signed)
Medication Refill requested for :amlodipine, lorazepam, clopidogrel and omeprazole  Pharmacy:[facility] [address]  Return Contact : [phone]

## 2016-09-26 ENCOUNTER — Ambulatory Visit: Payer: Self-pay | Admitting: Internal Medicine

## 2016-10-16 ENCOUNTER — Encounter: Payer: Self-pay | Admitting: Internal Medicine

## 2016-10-16 ENCOUNTER — Ambulatory Visit (INDEPENDENT_AMBULATORY_CARE_PROVIDER_SITE_OTHER): Payer: Medicare HMO | Admitting: Internal Medicine

## 2016-10-16 ENCOUNTER — Telehealth: Payer: Self-pay | Admitting: Radiology

## 2016-10-16 ENCOUNTER — Ambulatory Visit (INDEPENDENT_AMBULATORY_CARE_PROVIDER_SITE_OTHER): Payer: Medicare HMO

## 2016-10-16 VITALS — BP 132/70 | HR 73 | Temp 98.1°F | Resp 16 | Ht 70.0 in | Wt 191.2 lb

## 2016-10-16 DIAGNOSIS — E1142 Type 2 diabetes mellitus with diabetic polyneuropathy: Secondary | ICD-10-CM | POA: Diagnosis not present

## 2016-10-16 DIAGNOSIS — E7800 Pure hypercholesterolemia, unspecified: Secondary | ICD-10-CM | POA: Diagnosis not present

## 2016-10-16 DIAGNOSIS — R053 Chronic cough: Secondary | ICD-10-CM

## 2016-10-16 DIAGNOSIS — E119 Type 2 diabetes mellitus without complications: Secondary | ICD-10-CM | POA: Diagnosis not present

## 2016-10-16 DIAGNOSIS — J321 Chronic frontal sinusitis: Secondary | ICD-10-CM

## 2016-10-16 DIAGNOSIS — R0609 Other forms of dyspnea: Secondary | ICD-10-CM

## 2016-10-16 DIAGNOSIS — F4310 Post-traumatic stress disorder, unspecified: Secondary | ICD-10-CM | POA: Diagnosis not present

## 2016-10-16 DIAGNOSIS — R5383 Other fatigue: Secondary | ICD-10-CM

## 2016-10-16 DIAGNOSIS — R05 Cough: Secondary | ICD-10-CM

## 2016-10-16 LAB — CBC WITH DIFFERENTIAL/PLATELET
BASOS ABS: 0.1 10*3/uL (ref 0.0–0.1)
Basophils Relative: 1.5 % (ref 0.0–3.0)
Eosinophils Absolute: 0.9 10*3/uL — ABNORMAL HIGH (ref 0.0–0.7)
Eosinophils Relative: 13.7 % — ABNORMAL HIGH (ref 0.0–5.0)
HEMATOCRIT: 42.9 % (ref 39.0–52.0)
HEMOGLOBIN: 14.2 g/dL (ref 13.0–17.0)
LYMPHS PCT: 24.1 % (ref 12.0–46.0)
Lymphs Abs: 1.5 10*3/uL (ref 0.7–4.0)
MCHC: 33.1 g/dL (ref 30.0–36.0)
MCV: 84.5 fl (ref 78.0–100.0)
MONOS PCT: 14.3 % — AB (ref 3.0–12.0)
Monocytes Absolute: 0.9 10*3/uL (ref 0.1–1.0)
NEUTROS PCT: 46.4 % (ref 43.0–77.0)
Neutro Abs: 2.9 10*3/uL (ref 1.4–7.7)
Platelets: 223 10*3/uL (ref 150.0–400.0)
RBC: 5.07 Mil/uL (ref 4.22–5.81)
RDW: 14.9 % (ref 11.5–15.5)
WBC: 6.3 10*3/uL (ref 4.0–10.5)

## 2016-10-16 LAB — LIPID PANEL
CHOL/HDL RATIO: 5
CHOLESTEROL: 148 mg/dL (ref 0–200)
HDL: 31.2 mg/dL — ABNORMAL LOW (ref >39.00)
LDL CALC: 97 mg/dL (ref 0–99)
NonHDL: 116.91
TRIGLYCERIDES: 101 mg/dL (ref 0.0–149.0)
VLDL: 20.2 mg/dL (ref 0.0–40.0)

## 2016-10-16 LAB — COMPREHENSIVE METABOLIC PANEL
ALBUMIN: 4.2 g/dL (ref 3.5–5.2)
ALT: 33 U/L (ref 0–53)
AST: 22 U/L (ref 0–37)
Alkaline Phosphatase: 68 U/L (ref 39–117)
BUN: 14 mg/dL (ref 6–23)
CALCIUM: 9.2 mg/dL (ref 8.4–10.5)
CHLORIDE: 105 meq/L (ref 96–112)
CO2: 31 meq/L (ref 19–32)
Creatinine, Ser: 0.88 mg/dL (ref 0.40–1.50)
GFR: 90.68 mL/min (ref >60.00)
Glucose, Bld: 121 mg/dL — ABNORMAL HIGH (ref 70–99)
POTASSIUM: 5 meq/L (ref 3.5–5.1)
Sodium: 141 mEq/L (ref 135–145)
Total Bilirubin: 0.5 mg/dL (ref 0.2–1.2)
Total Protein: 7.1 g/dL (ref 6.0–8.3)

## 2016-10-16 LAB — POCT GLYCOSYLATED HEMOGLOBIN (HGB A1C): Hemoglobin A1C: 6.6

## 2016-10-16 MED ORDER — AMOXICILLIN-POT CLAVULANATE 875-125 MG PO TABS: 1.0000 | ORAL_TABLET | Freq: Two times a day (BID) | ORAL | 0 refills | Status: DC

## 2016-10-16 NOTE — Patient Instructions (Addendum)
   I am treating you for chronic bacterial sinusitis .  This will take 2 weeks to clear up.        I am prescribing an antibiotic (augmentin )  to take twice daily to  manage the infection and the inflammation in your sinuses.    I want you to flush your sinuses twice daily with  THE PRELOADED SYRINGES YOU have LEFT OVER FROM YOUR SURGERY  Do this over the sink   Please take a probiotic ( Align, Flora que or [employer]) OR A GENERIC EQUIVALENT OF ONE OF THESE NAME BRAND PROBIOTICS for three weeks since you are taking an  antibiotic to prevent a very serious antibiotic associated infection  Called clostridium dificile colitis that can cause diarrhea, multi organ failure, sepsis and death if not managed.
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
## 2016-10-16 NOTE — Telephone Encounter (Signed)
Pt was brought down by CMA for labs and xray per PCP without being seen first. Pt was very aggravated during chest xray. When asking pt history for reason for exam, pt stated, "I don't even know why I'm getting this done, I just have a damn sinus infection!" Explained to pt that the reason for asking hx is for the radiologist to have a better understanding of the reason for the chest xray. Pt stated, "I don't know why you are asking so many damn fucking questions!" "I am having some trouble breathing for about a month." Pt continued to use explicit language throughout the exam. Advised pt again on why questions were being asked about any symptoms he may be having. Informed CMA and RN team lead of incident during exam. Pt denies any cp, fever, N/V. Pt has hx of asthma, diabetes and hypertension

## 2016-10-18 ENCOUNTER — Encounter: Payer: Self-pay | Admitting: Internal Medicine

## 2016-10-18 DIAGNOSIS — J321 Chronic frontal sinusitis: Secondary | ICD-10-CM | POA: Insufficient documentation

## 2016-10-18 NOTE — Assessment & Plan Note (Signed)
He continues to react adversarially and confrontational to anything unexpected and has been rude and used foul language  To several staff members.

## 2016-10-18 NOTE — Assessment & Plan Note (Signed)
Currently well-controlled on current medications . foot exam is normal today. Patient has microalbuminuria and is not taking lisinopril due to orthostasis noted at last visit in August. Resuming losartan 50 mg daily today.   Patient is tolerating statin therapy for secondary  CAD prevention   Lab Results  Component Value Date   HGBA1C 6.6 10/16/2016   Lab Results  Component Value Date   MICROALBUR 1.3 04/07/2016   Lab Results  Component Value Date   CHOL 148 10/16/2016   HDL 31.20 (L) 10/16/2016   LDLCALC 97 10/16/2016   LDLDIRECT 117.0 04/18/2016   TRIG 101.0 10/16/2016   CHOLHDL 5 10/16/2016   Lab Results  Component Value Date   MICROALBUR 1.3 04/07/2016

## 2016-10-18 NOTE — Assessment & Plan Note (Signed)
Chest x ray is normal.  symptoms may be due to deconditioning ,  His EF was normla in March 2018 (via preop exam)

## 2016-10-18 NOTE — Assessment & Plan Note (Signed)
Symptoms have been present for nearly a month.  augmentin x 2 weeks,  Saline sinus  Flushes,  Probiotics.

## 2016-10-24 ENCOUNTER — Other Ambulatory Visit: Payer: Self-pay | Admitting: Internal Medicine

## 2016-10-24 DIAGNOSIS — R059 Cough, unspecified: Secondary | ICD-10-CM

## 2016-10-24 DIAGNOSIS — R05 Cough: Secondary | ICD-10-CM

## 2016-10-27 ENCOUNTER — Other Ambulatory Visit: Payer: Self-pay | Admitting: Internal Medicine

## 2016-10-27 DIAGNOSIS — N138 Other obstructive and reflux uropathy: Secondary | ICD-10-CM

## 2016-10-27 DIAGNOSIS — N401 Enlarged prostate with lower urinary tract symptoms: Principal | ICD-10-CM

## 2016-10-27 MED ORDER — CARVEDILOL 12.5 MG PO TABS: 12.5000 mg | ORAL_TABLET | Freq: Two times a day (BID) | ORAL | 1 refills | Status: DC

## 2016-10-27 MED ORDER — TAMSULOSIN HCL 0.4 MG PO CAPS: 0.4000 mg | ORAL_CAPSULE | Freq: Every day | ORAL | 3 refills | Status: DC

## 2016-10-27 MED ORDER — AMLODIPINE BESYLATE 5 MG PO TABS: 5.0000 mg | ORAL_TABLET | Freq: Every day | ORAL | 0 refills | Status: DC

## 2016-10-27 NOTE — Telephone Encounter (Signed)
Pt called also requesting refills on amLODipine (NORVASC) 5 MG tablet, carvedilol (COREG) 12.5 MG tablet, and tamsulosin (FLOMAX) 0.4 MG CAPS capsule. Please advise, thank you!  Pharmacy - [facility] [place] [place] [facility] [address] [place]  Call pt @ [phone]

## 2016-11-03 ENCOUNTER — Ambulatory Visit: Payer: Self-pay | Admitting: Cardiovascular Disease

## 2016-11-25 ENCOUNTER — Telehealth: Payer: Self-pay | Admitting: Internal Medicine

## 2016-11-25 NOTE — Telephone Encounter (Signed)
Pt called about needing a refill for carvedilol (COREG) 12.5 MG tablet.  Pharmacy is [id number] - [place], [place]  Call pt @ [phone]. Thank you!

## 2016-11-26 NOTE — Telephone Encounter (Signed)
LMTCB

## 2016-11-26 NOTE — Telephone Encounter (Signed)
Pt called back to follow up on call from this afternoon. Pt states if possible to leave a detailed message. Please advise?  Call pt @ [phone]. Thank you!

## 2016-11-26 NOTE — Telephone Encounter (Signed)
Pt returned office phone call. Pt states you can leave a detailed message.

## 2016-11-27 NOTE — Telephone Encounter (Signed)
Spoke with pt in the office yesterday about his carvedilol rx. Explained to pt that the medication was already at the pharmacy and all he needed to do was call them and let them know that he needed to have it refilled. Pt didn't like that so I called the pharmacy for him and let them know that he was ready for a refill so he could pick it up on his way home.

## 2016-11-28 ENCOUNTER — Other Ambulatory Visit: Payer: Self-pay | Admitting: Internal Medicine

## 2016-11-28 ENCOUNTER — Ambulatory Visit: Payer: Self-pay

## 2016-11-28 ENCOUNTER — Ambulatory Visit (INDEPENDENT_AMBULATORY_CARE_PROVIDER_SITE_OTHER): Payer: Medicare HMO

## 2016-11-28 ENCOUNTER — Telehealth: Payer: Self-pay | Admitting: Cardiovascular Disease

## 2016-11-28 ENCOUNTER — Telehealth: Payer: Self-pay | Admitting: Internal Medicine

## 2016-11-28 DIAGNOSIS — Z23 Encounter for immunization: Secondary | ICD-10-CM | POA: Diagnosis not present

## 2016-11-28 MED ORDER — NITROGLYCERIN 0.4 MG SL SUBL: 0.4000 mg | SUBLINGUAL_TABLET | SUBLINGUAL | 1 refills | Status: DC | PRN

## 2016-11-28 NOTE — Telephone Encounter (Signed)
Pt calling stating he went to Dr Derrel Nip for flu shot  He mentioned to her that lately he's been taking his Nitro like candy He's been having more CP than normal   Was advised to call us

## 2016-11-28 NOTE — Telephone Encounter (Signed)
Refilled. Sent to [facility] on [address].

## 2016-11-28 NOTE — Progress Notes (Signed)
Cardiology Office Note  Alcohol abuse Date:  12/01/2016   ID:  Ricky Abbott, DOB [birth year], MRN [id number]  PCP:  Crecencio Mc, MD   Chief Complaint  Patient presents with  . OTHER    6 month f/u chest pain using Nitro . Meds reviewed verbally with pt.    HPI:  71 yo caucasian male with  diabetes type 2,  PTSD,   coronary artery disease with stent placed 20 years ago in [place],  [place] in April 2014 presenting to [facility] with stent placed at that time  smoking, quit in 1984,  alcohol, depression.  Long history of drinking 40 ounces beers Previous insurance and financial issues affecting his follow-up in clinic and medication compliance.   Previously in [place] Presents for routine follow-up for chest pain, coronary artery disease  In follow-up he reports having periodic chest pain Seems to present 8 to 9 PM, past 6 weeks No pain in the past several days Pain typically when he is sitting in his recliner, arms propped up Also reports having numbness tingling down his right arm when he is sitting there  Also reports having pain in right wrist at site of previous cath  Stress at home  Pain after gall bladder surgery Sore abd at site of lap site Was told he might have umbilical hernia  Previous episodes of chest pain discussed with him, On last clinic visit at 7:53 PM was having chest pain while sitting in bed eating crackers watching TV Denies any significant chest pain on exertion  Gets out of breath walking, chronic issue Legs on fire when he walks  Neck is popping  On previous visit, did not like the way they ran previous stress test. They did not "care about the patient". "Only worried about his peak heart rate"  stress test November 2015 showing no ischemia, there was old scar in the mid to apical inferior wall  Labs reviewed Hemoglobin A1c 6.6 Total cholesterol around 150  EKG personally reviewed by myself on todays visit Shows normal sinus rhythm  rate 76 bpm right bundle branch block no significant ST or T wave changes   Other past medical history reviewed successful treatment of his hepatitis Previous problem with his drinking and PTSD, outburst in his apartment complex, neighbors got mad at him   he reports having back pain and chest pain April 2014. He was taken to [facility], transferred by helicopter to [facility].    history of  PTSD. 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 Has never been treated for this.    PMH:   has a past medical history of Allergy; Anxiety; Asthma; CAD, multiple vessel; COPD (chronic obstructive pulmonary disease) ([place]); Depression; Diabetes mellitus without complication ([place]); Dyspnea; GERD (gastroesophageal reflux disease); History of chicken pox; Hyperlipidemia; Hypertension; MI (myocardial infarction) ([place]); Prostate enlargement; PTSD (post-traumatic stress disorder); and Sarcoidosis of lung ([place]).  PSH:    Past Surgical History:  Procedure Laterality Date  . billary tube placement    . [place], [place]  . CARDIAC CATHETERIZATION     [facility]   . CHOLECYSTECTOMY N/A 06/05/2016   Procedure: LAPAROSCOPIC CHOLECYSTECTOMY with cholangiogram;  Surgeon: Jules Husbands, MD;  Location: [facility] ORS;  Service: General;  Laterality: N/A;  . [place], 05/2012   x 2  . IR GENERIC HISTORICAL  04/30/2016   IR PERC CHOLECYSTOSTOMY 04/30/2016 [facility]INTER[facility] RAD    Current Outpatient  Prescriptions  Medication Sig Dispense Refill  . amLODipine (NORVASC) 5 MG tablet Take 1 tablet (5 mg total) by mouth daily. 90 tablet 0  . aspirin 81 MG tablet Take 81 mg by mouth daily. Reported on 02/19/2015    . atorvastatin (LIPITOR) 80 MG tablet TAKE 1 TABLET BY MOUTH EVERY DAY 90 tablet 1  . carvedilol (COREG) 12.5 MG tablet Take 1 tablet (12.5 mg total) by mouth 2 (two) times daily with a meal. 180 tablet 1  . clopidogrel (PLAVIX) 75 MG tablet TAKE 1 TABLET(75 MG) BY  MOUTH DAILY 90 tablet 1  . LORazepam (ATIVAN) 1 MG tablet TAKE 1 TABLET BY MOUTH AT BEDTIME 90 tablet 0  . nitroGLYCERIN (NITROSTAT) 0.4 MG SL tablet PLACE 1 TABLET UNDER THE TONGUE EVERY 5 MINUTES AS NEEDED FOR CHEST PAIN 75 tablet 1  . omeprazole (PRILOSEC) 20 MG capsule TAKE 1 CAPSULE(20 MG) BY MOUTH DAILY 90 capsule 1  . tamsulosin (FLOMAX) 0.4 MG CAPS capsule Take 1 capsule (0.4 mg total) by mouth daily. 30 capsule 3   No current facility-administered medications for this visit.      Allergies:   Peanut-containing drug products   Social History:  The patient  reports that he quit smoking about 35 years ago. His smoking use included Cigarettes. He has a 100.00 pack-year smoking history. He has never used smokeless tobacco. He reports that he does not drink alcohol or use drugs.   Family History:   family history includes Cancer in his brother, father, and mother; Hyperlipidemia in his father and mother; Hypertension in his father and mother.    Review of Systems: Review of Systems  Constitutional: Negative.   Respiratory: Negative.   Cardiovascular: Positive for chest pain.  Gastrointestinal: Negative.   Musculoskeletal: Negative.   Neurological: Negative.        Leg burning with walking  Psychiatric/Behavioral: Negative.   All other systems reviewed and are negative.    PHYSICAL EXAM: VS:  BP 124/64 (BP Location: Left Arm, Patient Position: Sitting, Cuff Size: Normal)   Pulse 76   Ht 5\' 11"  (1.803 m)   Wt 197 lb 12 oz (89.7 kg)   BMI 27.58 kg/m  , BMI Body mass index is 27.58 kg/m. GEN: Well nourished, well developed, in no acute distress  HEENT: normal  Neck: no JVD, carotid bruits, or masses Cardiac: RRR; no murmurs, rubs, or gallops,no edema unable to appreciate pulse right lower extremity DP or PT Respiratory:  clear to auscultation bilaterally, normal work of breathing GI: soft, nontender, nondistended, + BS MS: no deformity or atrophy  Skin: warm and dry, no  rash Neuro:  Strength and sensation are intact Psych: euthymic mood, full affect    Recent Labs: 04/28/2016: B Natriuretic Peptide 98.0; Magnesium 1.6 10/16/2016: ALT 33; BUN 14; Creatinine, Ser 0.88; Hemoglobin 14.2; Platelets 223.0; Potassium 5.0; Sodium 141    Lipid Panel Lab Results  Component Value Date   CHOL 148 10/16/2016   HDL 31.20 (L) 10/16/2016   LDLCALC 97 10/16/2016   TRIG 101.0 10/16/2016      Wt Readings from Last 3 Encounters:  12/01/16 197 lb 12 oz (89.7 kg)  10/16/16 191 lb 3.2 oz (86.7 kg)  06/05/16 188 lb (85.3 kg)       ASSESSMENT AND PLAN:  CAD, multiple vessel   Atypical chest pain presenting at rest On prior office visit also had atypical chest pain Symptoms concerning for muscle spasms in his chest, reports he beats on his  chest, massages them to make symptoms go away,  Recommended he stop Lipitor for several weeks to see if symptoms improve Suggested he watch his position as he had previous symptoms when spending most of his day in the bed, now spending hours at a time in his recliner, arms going numb  Pure hypercholesterolemia -  Suggested he stopped a atorvastatin for now to see if muscle cramps go away If symptoms improve, could try generic Crestor  Essential hypertension -  Blood pressure is well controlled on today's visit. No changes made to the medications.  Claudication in peripheral vascular disease ([place]) -  Previous lower extremity Doppler with minimal disease on the right Minimal carotid disease  H/O burning pain in leg  neuropathy , previous lower extremity arterial Doppler was essentially normal  PTSD (post-traumatic stress disorder) Likely has underlying depression. Does not get outside the house much,  calls himself a hermit Does not want to be managed by the [facility] hospital  Type 2 diabetes, HbA1c goal < 7% (HCC) Hemoglobin A1c 6.6  Alcohol abuse Previous history of alcohol abuse.  Recommended alcohol cessation   Total  encounter time more than 25 minutes  Greater than 50% was spent in counseling and coordination of care with the patient    Orders Placed This Encounter  Procedures  . EKG 12-Lead     Signed, Esmond Plants, M.D., Ph.D. 12/01/2016  [facility], [place]

## 2016-11-28 NOTE — Telephone Encounter (Signed)
Spoke with patient and he states that he has been "eating his nitro like candy" recently. He reports chest pain with shortness of breath and that when it gets sharp he is not able to breath well. While at Dr. Lupita Dawn office he mentioned he needed refill for his nitro and that was when they advised him to call us for a sooner appointment. Reviewed symptoms to monitor which would require visit to ED and rescheduled him to come in on Monday to see Dr. Rockey Situ. He verbalized understanding of our conversation, agreement with plan, and had no further questions or concerns at this time.

## 2016-11-28 NOTE — Telephone Encounter (Signed)
Pt needs a refill on his nitroGLYCERIN (NITROSTAT) 0.4 MG SL t.ablet.

## 2016-12-01 ENCOUNTER — Encounter: Payer: Self-pay | Admitting: Cardiovascular Disease

## 2016-12-01 ENCOUNTER — Ambulatory Visit (INDEPENDENT_AMBULATORY_CARE_PROVIDER_SITE_OTHER): Payer: Medicare HMO | Admitting: Cardiovascular Disease

## 2016-12-01 VITALS — BP 124/64 | HR 76 | Ht 71.0 in | Wt 197.8 lb

## 2016-12-01 DIAGNOSIS — I25118 Atherosclerotic heart disease of native coronary artery with other forms of angina pectoris: Secondary | ICD-10-CM | POA: Diagnosis not present

## 2016-12-01 DIAGNOSIS — E1142 Type 2 diabetes mellitus with diabetic polyneuropathy: Secondary | ICD-10-CM

## 2016-12-01 DIAGNOSIS — J449 Chronic obstructive pulmonary disease, unspecified: Secondary | ICD-10-CM | POA: Diagnosis not present

## 2016-12-01 DIAGNOSIS — E7800 Pure hypercholesterolemia, unspecified: Secondary | ICD-10-CM

## 2016-12-01 DIAGNOSIS — Z87898 Personal history of other specified conditions: Secondary | ICD-10-CM | POA: Diagnosis not present

## 2016-12-01 DIAGNOSIS — F1011 Alcohol abuse, in remission: Secondary | ICD-10-CM

## 2016-12-01 DIAGNOSIS — Z955 Presence of coronary angioplasty implant and graft: Secondary | ICD-10-CM

## 2016-12-01 DIAGNOSIS — I10 Essential (primary) hypertension: Secondary | ICD-10-CM | POA: Diagnosis not present

## 2016-12-01 DIAGNOSIS — D869 Sarcoidosis, unspecified: Secondary | ICD-10-CM

## 2016-12-01 NOTE — Patient Instructions (Addendum)
Eye doctor for diabetes  Medication Instructions:   Please do an experiment with the atorvastatin Hold for a few weeks to see if chest pains get better If they get better, call the office If no relief, go back on atorvastatin  Labwork:  No new labs needed  Testing/Procedures:  No further testing at this time   Follow-Up: It was a pleasure seeing you in the office today. Please call us if you have new issues that need to be addressed before your next appt.  [phone]  Your physician wants you to follow-up in: as needed   If you need a refill on your cardiac medications before your next appointment, please call your pharmacy.

## 2016-12-08 ENCOUNTER — Ambulatory Visit: Payer: Self-pay | Admitting: Cardiovascular Disease

## 2017-01-16 ENCOUNTER — Ambulatory Visit: Payer: Self-pay | Admitting: Internal Medicine

## 2017-01-20 ENCOUNTER — Ambulatory Visit: Payer: Self-pay | Admitting: Internal Medicine

## 2017-01-22 ENCOUNTER — Ambulatory Visit: Payer: Medicare HMO | Admitting: Internal Medicine

## 2017-01-22 ENCOUNTER — Encounter: Payer: Self-pay | Admitting: Internal Medicine

## 2017-01-22 VITALS — BP 130/72 | HR 85 | Temp 98.1°F | Resp 16 | Ht 71.0 in | Wt 199.6 lb

## 2017-01-22 DIAGNOSIS — I10 Essential (primary) hypertension: Secondary | ICD-10-CM

## 2017-01-22 DIAGNOSIS — N138 Other obstructive and reflux uropathy: Secondary | ICD-10-CM | POA: Diagnosis not present

## 2017-01-22 DIAGNOSIS — E1142 Type 2 diabetes mellitus with diabetic polyneuropathy: Secondary | ICD-10-CM

## 2017-01-22 DIAGNOSIS — R05 Cough: Secondary | ICD-10-CM

## 2017-01-22 DIAGNOSIS — N401 Enlarged prostate with lower urinary tract symptoms: Secondary | ICD-10-CM

## 2017-01-22 DIAGNOSIS — F4312 Post-traumatic stress disorder, chronic: Secondary | ICD-10-CM

## 2017-01-22 DIAGNOSIS — R059 Cough, unspecified: Secondary | ICD-10-CM

## 2017-01-22 MED ORDER — OMEPRAZOLE 20 MG PO CPDR: DELAYED_RELEASE_CAPSULE | ORAL | 1 refills | Status: DC

## 2017-01-22 MED ORDER — TAMSULOSIN HCL 0.4 MG PO CAPS: 0.4000 mg | ORAL_CAPSULE | Freq: Every day | ORAL | 3 refills | Status: DC

## 2017-01-22 NOTE — Progress Notes (Signed)
Subjective:  Patient ID: Ricky Abbott, male    DOB: [birth year]  Age: 71 y.o. MRN: [id number]  CC: The primary encounter diagnosis was DM type 2 with diabetic peripheral neuropathy ([place]). Diagnoses of BPH with obstruction/lower urinary tract symptoms, Cough, Chronic post-traumatic stress disorder (PTSD), and Essential hypertension were also pertinent to this visit.  HPI Ricky Abbott presents for follow up on type 2 DM , hypertension,  Hyperlipidemia  Sept:  Treated for chronic sinusitis with augmentin x 2 weeks.  Never started the antibiotics due to fear of diarrhea .  Symptoms have resolved   Lives in an apartment near the [employer]   Losartan prescribed at last visit for BP  Having a hard time accepting the fact that he is aging.  Gets nostalgic watching things on TV , makes him sad .  Cries a lot . Marland Kitchen Has not been taking zoloft or alprazolam  SINCE HIS GALLBLADDER SURGERY  Sleeping until 12:00 or 1:00 no xanax in several weeks. Ricky Abbott "tranquility and peace."   Skips breakfast regularly,  Drinks a sugar free [employer] drink every morning,  With caffeine  20 ounces.  Has blurred vision every morning for the first hour or two ,  Improves after time .  Last eye exam in July 2017   Foot exam normal      Lab Results  Component Value Date   HGBA1C 6.6 10/16/2016   Lab Results  Component Value Date   MICROALBUR 1.3 04/07/2016     Outpatient Medications Prior to Visit  Medication Sig Dispense Refill  . amLODipine (NORVASC) 5 MG tablet Take 1 tablet (5 mg total) by mouth daily. 90 tablet 0  . aspirin 81 MG tablet Take 81 mg by mouth daily. Reported on 02/19/2015    . atorvastatin (LIPITOR) 80 MG tablet TAKE 1 TABLET BY MOUTH EVERY DAY 90 tablet 1  . carvedilol (COREG) 12.5 MG tablet Take 1 tablet (12.5 mg total) by mouth 2 (two) times daily with a meal. 180 tablet 1  . clopidogrel (PLAVIX) 75 MG tablet TAKE 1 TABLET(75 MG) BY MOUTH DAILY 90 tablet 1   . LORazepam (ATIVAN) 1 MG tablet TAKE 1 TABLET BY MOUTH AT BEDTIME 90 tablet 0  . nitroGLYCERIN (NITROSTAT) 0.4 MG SL tablet PLACE 1 TABLET UNDER THE TONGUE EVERY 5 MINUTES AS NEEDED FOR CHEST PAIN 75 tablet 1  . omeprazole (PRILOSEC) 20 MG capsule TAKE 1 CAPSULE(20 MG) BY MOUTH DAILY 90 capsule 1  . tamsulosin (FLOMAX) 0.4 MG CAPS capsule Take 1 capsule (0.4 mg total) by mouth daily. 30 capsule 3   No facility-administered medications prior to visit.     Review of Systems;  Patient denies headache, fevers, malaise, unintentional weight loss, skin rash, eye pain, sinus congestion and sinus pain, sore throat, dysphagia,  hemoptysis , cough, dyspnea, wheezing, chest pain, palpitations, orthopnea, edema, abdominal pain, nausea, melena, diarrhea, constipation, flank pain, dysuria, hematuria, urinary  Frequency, nocturia, numbness, tingling, seizures,  Focal weakness, Loss of consciousness,  Tremor, insomnia, depression, anxiety, and suicidal ideation.      Objective:  BP 130/72 (BP Location: Left Arm, Patient Position: Sitting, Cuff Size: Normal)   Pulse 85   Temp 98.1 F (36.7 C) (Oral)   Resp 16   Ht 5\' 11"  (1.803 m)   Wt 199 lb 9.6 oz (90.5 kg)   SpO2 96%   BMI 27.84 kg/m   BP Readings from Last 3 Encounters:  01/22/17 130/72  12/01/16 124/64  10/16/16 132/70    Wt Readings from Last 3 Encounters:  01/22/17 199 lb 9.6 oz (90.5 kg)  12/01/16 197 lb 12 oz (89.7 kg)  10/16/16 191 lb 3.2 oz (86.7 kg)    General appearance: alert, cooperative and appears stated age Ears: normal TM's and external ear canals both ears Throat: lips, mucosa, and tongue normal; teeth and gums normal Neck: no adenopathy, no carotid bruit, supple, symmetrical, trachea midline and thyroid not enlarged, symmetric, no tenderness/mass/nodules Back: symmetric, no curvature. ROM normal. No CVA tenderness. Lungs: clear to auscultation bilaterally Heart: regular rate and rhythm, S1, S2 normal, no murmur,  click, rub or gallop Abdomen: soft, non-tender; bowel sounds normal; no masses,  no organomegaly Pulses: 2+ and symmetric Skin: Skin color, texture, turgor normal. No rashes or lesions Lymph nodes: Cervical, supraclavicular, and axillary nodes normal.  Lab Results  Component Value Date   HGBA1C 6.6 10/16/2016   HGBA1C 7.1 04/07/2016   HGBA1C 6.8 (H) 09/27/2015    Lab Results  Component Value Date   CREATININE 0.88 10/16/2016   CREATININE 0.83 05/12/2016   CREATININE 1.09 05/08/2016    Lab Results  Component Value Date   WBC 6.3 10/16/2016   HGB 14.2 10/16/2016   HCT 42.9 10/16/2016   PLT 223.0 10/16/2016   GLUCOSE 121 (H) 10/16/2016   CHOL 148 10/16/2016   TRIG 101.0 10/16/2016   HDL 31.20 (L) 10/16/2016   LDLDIRECT 117.0 04/18/2016   LDLCALC 97 10/16/2016   ALT 33 10/16/2016   AST 22 10/16/2016   NA 141 10/16/2016   K 5.0 10/16/2016   CL 105 10/16/2016   CREATININE 0.88 10/16/2016   BUN 14 10/16/2016   CO2 31 10/16/2016   TSH 0.95 02/19/2015   PSA 1.82 02/19/2015   INR 1.18 04/30/2016   HGBA1C 6.6 10/16/2016   MICROALBUR 1.3 04/07/2016    No results found.  Assessment & Plan:   Problem List Items Addressed This Visit    Chronic post-traumatic stress disorder (PTSD)    Recommended resuming lorazepam and zoloft for recurrence of symptoms.  I also support hisacquiring of a dog for emotional support       DM type 2 with diabetic peripheral neuropathy ([place]) - Primary    Currently well-controlled on current medications . foot exam is normal today. Patient has microalbuminuria and is  Taking losartan 50 mg daily   Patient is tolerating statin therapy for secondary  CAD prevention   Lab Results  Component Value Date   HGBA1C 6.6 10/16/2016   Lab Results  Component Value Date   MICROALBUR 1.3 04/07/2016   Lab Results  Component Value Date   CHOL 148 10/16/2016   HDL 31.20 (L) 10/16/2016   LDLCALC 97 10/16/2016   LDLDIRECT 117.0 04/18/2016   TRIG 101.0  10/16/2016   CHOLHDL 5 10/16/2016   Lab Results  Component Value Date   MICROALBUR 1.3 04/07/2016         Relevant Orders   Hemoglobin A1c   Comprehensive metabolic panel   POCT HgB A1C   Essential hypertension    Well controlled on current regimen. Renal function stable, no changes today.  Lab Results  Component Value Date   CREATININE 0.88 10/16/2016          Other Visit Diagnoses    BPH with obstruction/lower urinary tract symptoms       Relevant Medications   tamsulosin (FLOMAX) 0.4 MG CAPS capsule   Cough  Relevant Medications   omeprazole (PRILOSEC) 20 MG capsule     A total of 25 minutes of face to face time was spent with patient more than half of which was spent in counselling about the above mentioned conditions  and coordination of care  I am having Ricky Abbott maintain his aspirin, clopidogrel, LORazepam, atorvastatin, amLODipine, carvedilol, nitroGLYCERIN, tamsulosin, and omeprazole.  Meds ordered this encounter  Medications  . tamsulosin (FLOMAX) 0.4 MG CAPS capsule    Sig: Take 1 capsule (0.4 mg total) by mouth daily.    Dispense:  30 capsule    Refill:  3  . omeprazole (PRILOSEC) 20 MG capsule    Sig: TAKE 1 CAPSULE(20 MG) BY MOUTH DAILY    Dispense:  90 capsule    Refill:  1    Medications Discontinued During This Encounter  Medication Reason  . tamsulosin (FLOMAX) 0.4 MG CAPS capsule Reorder  . omeprazole (PRILOSEC) 20 MG capsule Reorder    Follow-up: No Follow-up on file.   Crecencio Mc, MD

## 2017-01-22 NOTE — Patient Instructions (Addendum)
I recommend you resume sertraline starting at 50 mg daily after dinner .  Start with 1/2 tablet of the 50 mg  Dose.  Increase  TO FULL TABLET AFTER ONE WEEK IF YOU ARE TOLERATING WITHOUT NAUSEA.  I HIGHLY RECOMMEND RESCUING A DOG.  I WILL WRITE YOU THE LETTER FOR YOUR APARTMENT  TO WAIVE THE FEE BECAUSE YOU NEED AN "EMOTIONAL SUPPORT DOG"      A normal blood sugar is  Fasting (before you eat);  80 to 125  2 hours after a meal:  120 to 160

## 2017-01-24 NOTE — Assessment & Plan Note (Addendum)
Recommended resuming lorazepam and zoloft for recurrence of symptoms.  I also support hisacquiring of a dog for emotional support

## 2017-01-24 NOTE — Assessment & Plan Note (Addendum)
Currently well-controlled on current medications . foot exam is normal today. Patient has microalbuminuria and is  Taking losartan 50 mg daily   Patient is tolerating statin therapy for secondary  CAD prevention   Lab Results  Component Value Date   HGBA1C 6.6 10/16/2016   Lab Results  Component Value Date   MICROALBUR 1.3 04/07/2016   Lab Results  Component Value Date   CHOL 148 10/16/2016   HDL 31.20 (L) 10/16/2016   LDLCALC 97 10/16/2016   LDLDIRECT 117.0 04/18/2016   TRIG 101.0 10/16/2016   CHOLHDL 5 10/16/2016   Lab Results  Component Value Date   MICROALBUR 1.3 04/07/2016

## 2017-01-24 NOTE — Assessment & Plan Note (Signed)
Well controlled on current regimen. Renal function stable, no changes today.  Lab Results  Component Value Date   CREATININE 0.88 10/16/2016

## 2017-02-06 ENCOUNTER — Other Ambulatory Visit: Payer: Self-pay | Admitting: *Deleted

## 2017-02-06 ENCOUNTER — Telehealth: Payer: Self-pay | Admitting: Internal Medicine

## 2017-02-06 MED ORDER — AMLODIPINE BESYLATE 5 MG PO TABS: 5.0000 mg | ORAL_TABLET | Freq: Every day | ORAL | 0 refills | Status: DC

## 2017-02-06 MED ORDER — ATORVASTATIN CALCIUM 80 MG PO TABS: 80.0000 mg | ORAL_TABLET | Freq: Every day | ORAL | 1 refills | Status: DC

## 2017-02-06 NOTE — Telephone Encounter (Signed)
Copied from [place] [id number]. Topic: Quick Communication - Rx Refill/Question >> Feb 06, 2017  7:39 AM Scherrie Gerlach wrote: Has the patient contacted their pharmacy? Pt has switched to another pharmacy, these filled at the other one. Pt request refill  amLODipine (NORVASC) 5 MG tablet atorvastatin (LIPITOR) 80 MG tablet  [id number] [address], [place] [phone] (Phone) [phone] (Fax)

## 2017-02-11 ENCOUNTER — Other Ambulatory Visit: Payer: Self-pay | Admitting: Internal Medicine

## 2017-02-25 MED ORDER — CLOPIDOGREL BISULFATE 75 MG PO TABS: ORAL_TABLET | ORAL | 1 refills | Status: DC

## 2017-02-25 MED ORDER — CARVEDILOL 12.5 MG PO TABS: 12.5000 mg | ORAL_TABLET | Freq: Two times a day (BID) | ORAL | 1 refills | Status: DC

## 2017-02-25 NOTE — Telephone Encounter (Signed)
Copied from [place] [id number]. Topic: Quick Communication - Rx Refill/Question >> Feb 25, 2017  3:05 PM Patrice Paradise wrote: Medication:  carvedilol (COREG) 12.5 MG tablet  clopidogrel (PLAVIX) 75 MG tablet    Has the patient contacted their pharmacy? Yes.     (Agent: If no, request that the patient contact the pharmacy for the refill.)   Preferred Pharmacy (with phone number or street name):   [id number] [address], [place] - [address] [place] [address] Phone: [phone] Fax: [phone]   Agent: Please be advised that RX refills may take up to 3 business days. We ask that you follow-up with your pharmacy.

## 2017-02-26 ENCOUNTER — Telehealth: Payer: Self-pay | Admitting: Internal Medicine

## 2017-02-26 MED ORDER — PANTOPRAZOLE SODIUM 40 MG PO TBEC: 40.0000 mg | DELAYED_RELEASE_TABLET | Freq: Every day | ORAL | 3 refills | Status: DC

## 2017-02-26 NOTE — Telephone Encounter (Signed)
Please advise 

## 2017-02-26 NOTE — Telephone Encounter (Signed)
Pt. Called and said he clarified the medication question. States the pharmacist said the Prilosec interferes with the efficacy of Plavix. This something new.

## 2017-02-26 NOTE — Telephone Encounter (Signed)
Patient has been notified by VM

## 2017-02-26 NOTE — Telephone Encounter (Signed)
It is possible,  But if he has been taking the omeprazole and still needs to stay on something for his stomach,  I willl cal in an alternative to be on the safe side.

## 2017-02-26 NOTE — Addendum Note (Signed)
Addended by: Crecencio Mc on: 02/26/2017 03:42 PM   Modules accepted: Orders

## 2017-02-26 NOTE — Telephone Encounter (Signed)
Pt. Called concerned about his medication - states the pharmacist told him his Prilosec could cause increased bleeding risk with his Plavix. He wants Dr. Derrel Nip to know and wants her advise on this.

## 2017-02-26 NOTE — Telephone Encounter (Signed)
FYI

## 2017-03-06 ENCOUNTER — Telehealth: Payer: Self-pay

## 2017-03-06 MED ORDER — GLUCOSE BLOOD VI STRP: ORAL_STRIP | 2 refills | Status: DC

## 2017-03-06 MED ORDER — ONETOUCH LANCETS MISC: 1.0000 "application " | Freq: Every day | 1 refills | Status: DC

## 2017-03-06 NOTE — Addendum Note (Signed)
Addended by: Adair Laundry on: 03/06/2017 04:21 PM   Modules accepted: Orders

## 2017-03-06 NOTE — Telephone Encounter (Signed)
Copied from [place] [phone]. Topic: General - Other >> Mar 06, 2017  3:51 PM Yvette Rack wrote: Reason for CRM: patient wanting a refill on his lancet needles stating that he had received them at the practice

## 2017-03-06 NOTE — Telephone Encounter (Signed)
Strips and lancets have been refilled

## 2017-03-09 DIAGNOSIS — I2510 Atherosclerotic heart disease of native coronary artery without angina pectoris: Secondary | ICD-10-CM | POA: Insufficient documentation

## 2017-03-09 DIAGNOSIS — E119 Type 2 diabetes mellitus without complications: Secondary | ICD-10-CM | POA: Diagnosis not present

## 2017-03-09 DIAGNOSIS — E7800 Pure hypercholesterolemia, unspecified: Secondary | ICD-10-CM | POA: Insufficient documentation

## 2017-03-09 DIAGNOSIS — I10 Essential (primary) hypertension: Secondary | ICD-10-CM | POA: Diagnosis not present

## 2017-03-24 ENCOUNTER — Ambulatory Visit: Payer: Self-pay

## 2017-04-07 DIAGNOSIS — I10 Essential (primary) hypertension: Secondary | ICD-10-CM | POA: Diagnosis not present

## 2017-04-07 DIAGNOSIS — I2510 Atherosclerotic heart disease of native coronary artery without angina pectoris: Secondary | ICD-10-CM | POA: Diagnosis not present

## 2017-04-07 DIAGNOSIS — E7800 Pure hypercholesterolemia, unspecified: Secondary | ICD-10-CM | POA: Diagnosis not present

## 2017-04-07 DIAGNOSIS — E119 Type 2 diabetes mellitus without complications: Secondary | ICD-10-CM | POA: Diagnosis not present

## 2017-04-23 ENCOUNTER — Ambulatory Visit: Payer: Self-pay | Admitting: Internal Medicine

## 2017-07-15 DIAGNOSIS — E119 Type 2 diabetes mellitus without complications: Secondary | ICD-10-CM | POA: Diagnosis not present

## 2017-07-15 DIAGNOSIS — E7800 Pure hypercholesterolemia, unspecified: Secondary | ICD-10-CM | POA: Diagnosis not present

## 2017-07-22 DIAGNOSIS — E6609 Other obesity due to excess calories: Secondary | ICD-10-CM | POA: Diagnosis not present

## 2017-07-22 DIAGNOSIS — H9193 Unspecified hearing loss, bilateral: Secondary | ICD-10-CM | POA: Diagnosis not present

## 2017-07-22 DIAGNOSIS — Z66 Do not resuscitate: Secondary | ICD-10-CM | POA: Insufficient documentation

## 2017-07-22 DIAGNOSIS — Z0000 Encounter for general adult medical examination without abnormal findings: Secondary | ICD-10-CM | POA: Diagnosis not present

## 2017-07-22 DIAGNOSIS — E7800 Pure hypercholesterolemia, unspecified: Secondary | ICD-10-CM | POA: Diagnosis not present

## 2017-07-22 DIAGNOSIS — Z6831 Body mass index (BMI) 31.0-31.9, adult: Secondary | ICD-10-CM | POA: Diagnosis not present

## 2017-07-22 DIAGNOSIS — I10 Essential (primary) hypertension: Secondary | ICD-10-CM | POA: Diagnosis not present

## 2017-07-22 DIAGNOSIS — E119 Type 2 diabetes mellitus without complications: Secondary | ICD-10-CM | POA: Diagnosis not present

## 2017-07-22 DIAGNOSIS — Z7185 Encounter for immunization safety counseling: Secondary | ICD-10-CM | POA: Insufficient documentation

## 2017-08-03 DIAGNOSIS — J301 Allergic rhinitis due to pollen: Secondary | ICD-10-CM | POA: Diagnosis not present

## 2017-08-03 DIAGNOSIS — H903 Sensorineural hearing loss, bilateral: Secondary | ICD-10-CM | POA: Diagnosis not present

## 2017-08-03 DIAGNOSIS — H9319 Tinnitus, unspecified ear: Secondary | ICD-10-CM | POA: Diagnosis not present

## 2017-08-28 IMAGING — MR MR CERVICAL SPINE W/O CM
5 series · 33 of 48 positions shown · non-contrast
Comparison: None.

CLINICAL DATA: Neck pain.  Recurrent dizziness with neck extension.

EXAM:
MRI CERVICAL SPINE WITHOUT CONTRAST
TECHNIQUE: Multiplanar, multisequence MR imaging of the cervical spine was
performed. No intravenous contrast was administered.

[Series 2: T2 · sagittal · 3.0mm · 0.56mm/px · 6 of 13 slices shown (1 of 2)]
[im 1/13]
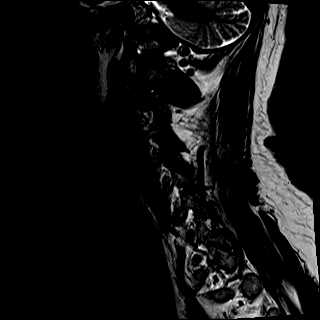
[im 3/13]
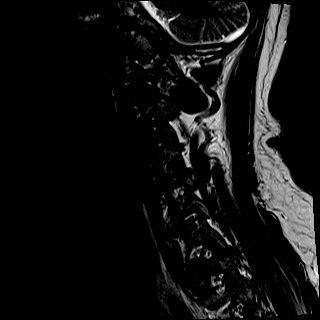
[im 5/13]
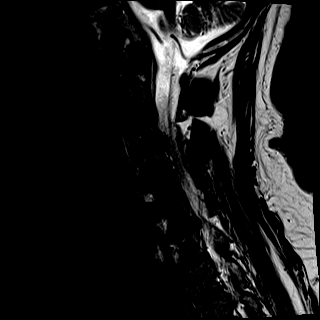
[im 8/13]
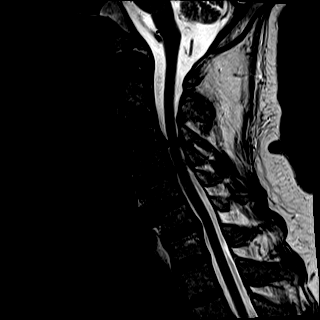
[im 10/13]
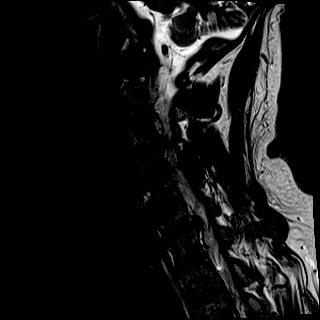
[im 13/13]
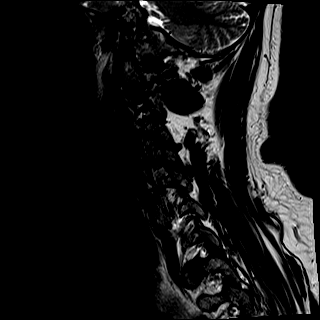

[Series 3: T1 · sagittal · 3.0mm · 0.70mm/px · 7 of 13 slices shown]
[im 1/13]
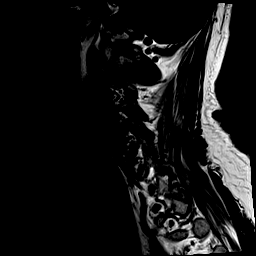
[im 3/13]
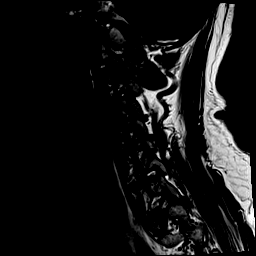
[im 5/13]
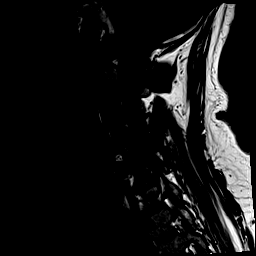
[im 7/13]
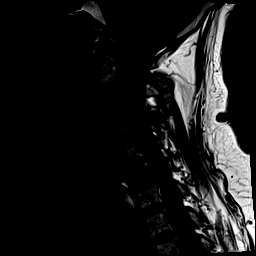
[im 9/13]
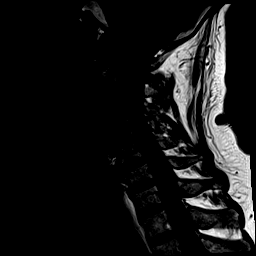
[im 11/13]
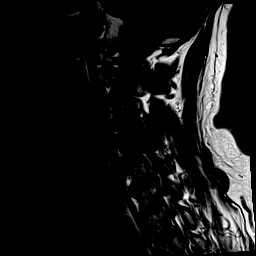
[im 13/13]
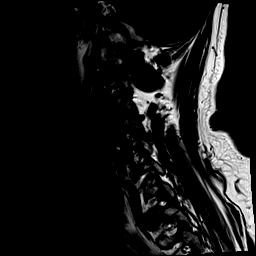

[Series 4: STIR · sagittal · 3.0mm · 0.35mm/px · 7 of 13 slices shown]
[im 1/13]
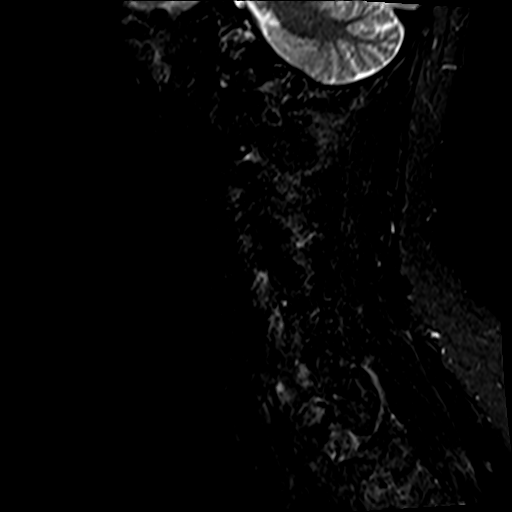
[im 3/13]
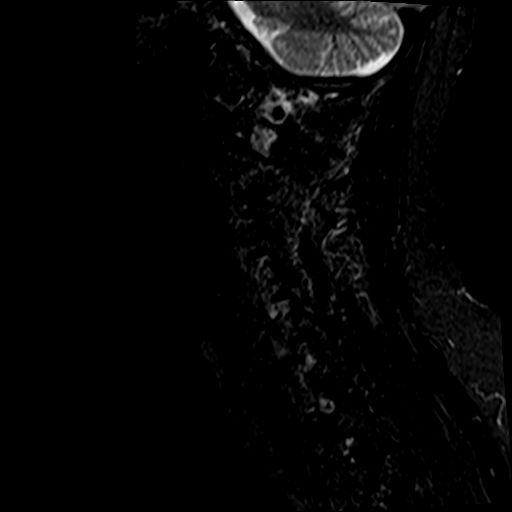
[im 5/13]
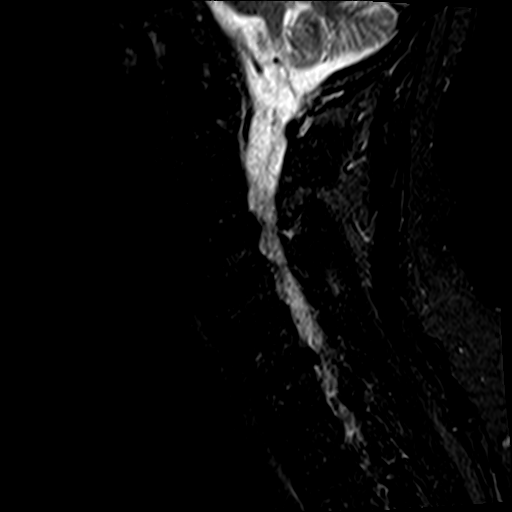
[im 7/13]
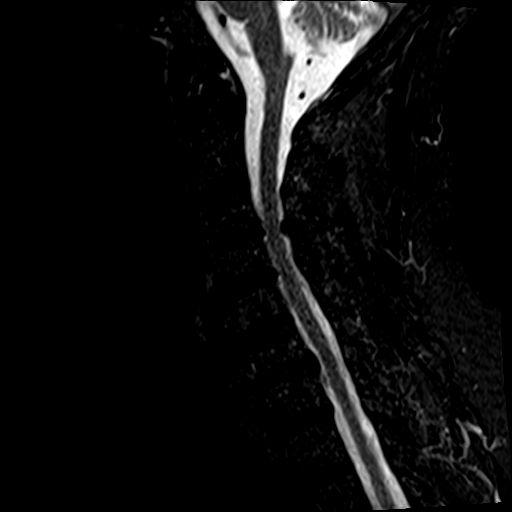
[im 9/13]
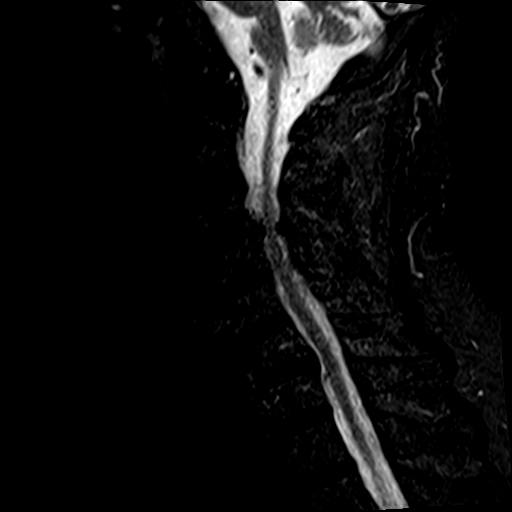
[im 11/13]
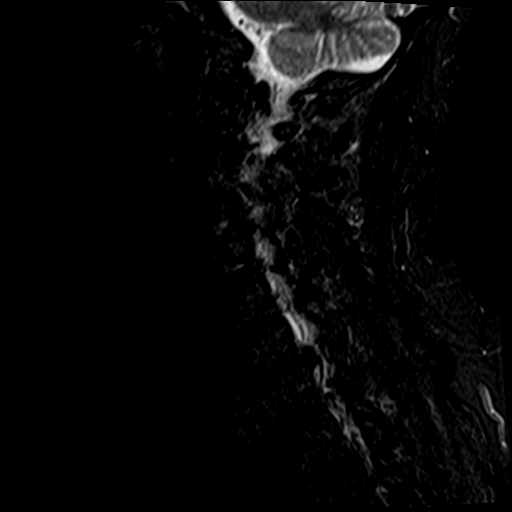
[im 13/13]
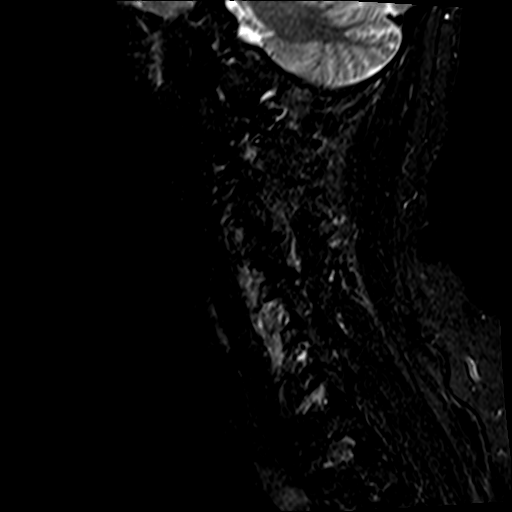

[Series 5: T2 · axial · 3.0mm · 0.62mm/px · z∈[-84,+10]mm · 8 of 27 slices shown (2 of 2)]
[im 1/27]
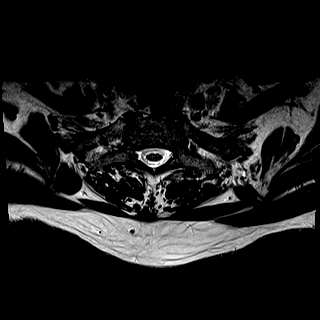
[im 5/27]
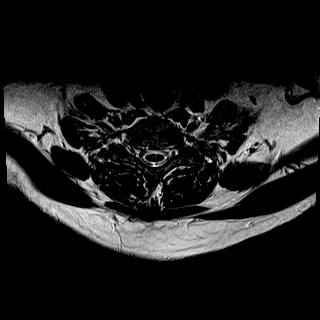
[im 9/27]
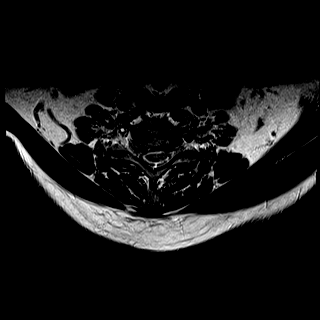
[im 13/27]
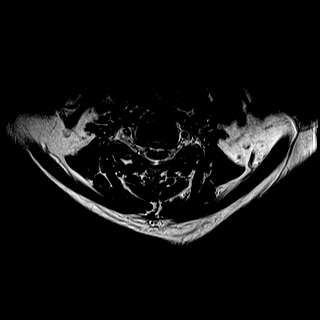
[im 15/27]
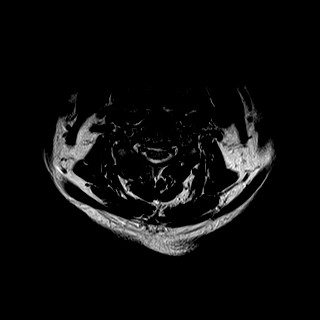
[im 19/27]
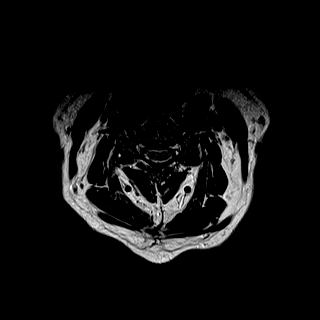
[im 23/27]
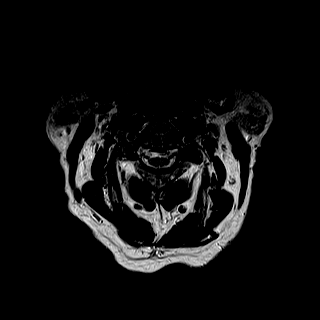
[im 27/27]
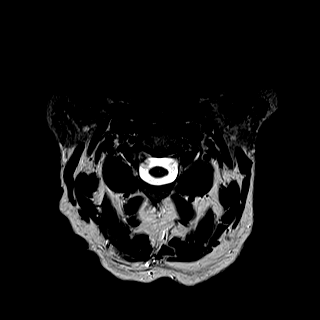

[Series 6: mpgr ax · axial · 3.0mm · 0.39mm/px · z∈[-75,-24]mm · 5 of 27 slices shown]
[im 1/27]
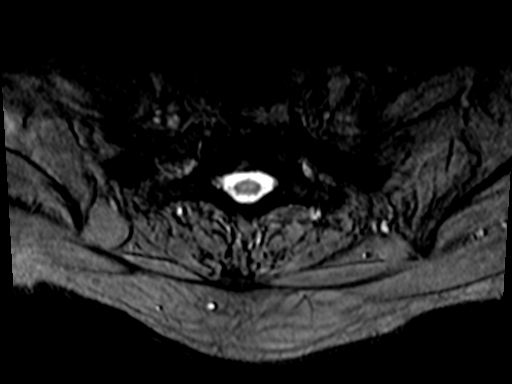
[im 5/27]
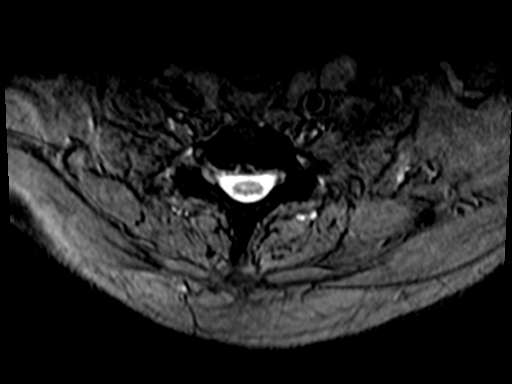
[im 9/27]
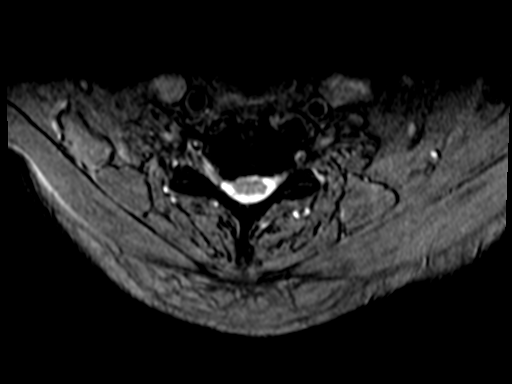
[im 13/27]
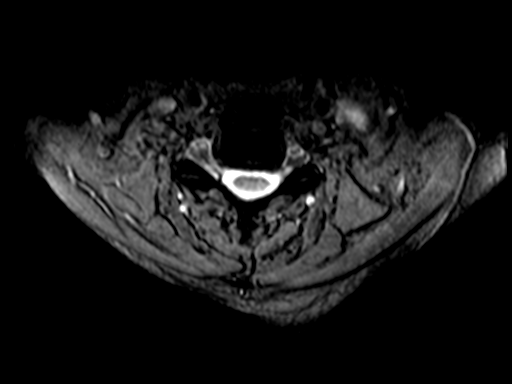
[im 15/27]
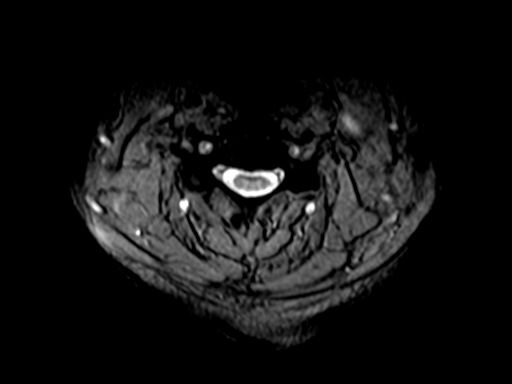

[33 of 48 positions shown; findings below may reference images not displayed]

FINDINGS: Alignment: Borderline anterolisthesis at C5-6.

Vertebrae: No fracture, evidence of discitis, or bone lesion.

Cord: Normal signal and morphology.

Posterior Fossa, vertebral arteries, paraspinal tissues: Loss of
expected flow related signal in the V1 and proximal V2 segment on
the right, normalized by the upper cervical spine. This could be
from slow flow or short segment occlusion.

Disc levels:

C2-3: Unremarkable.

C3-4: Posterior disc osteophyte complex, predominately disc
protrusion. Facet spurring with ligamentum flavum thickening and
buckling. Mild uncovertebral ridging. Spinal stenosis with early
cord flattening. Mild foraminal narrowing bilaterally.

C4-5: Posterior disc osteophyte complex, predominately disc
protrusion. Mild facet spurring and ligamentous thickening. Mild
spinal stenosis without cord deformity. Patent foramina

C5-6: Ventral spondylosis which is bulky.  No impingement

C6-7: Disc degeneration with bulging and spurring. Bilateral
foraminal narrowing from uncovertebral ridging, greater on the left.
Ventral subarachnoid space effacement without cord compression.

C7-T1:Mild facet spurring.  No impingement
IMPRESSION: 1. Spondylosis and disc degeneration. Disc protrusion and ligamentum
flavum thickening causes spinal stenosis at C3-4 and C4-5, with
early cord mass effect at C3-4.
2. Mild foraminal narrowing bilaterally at C3-4 and C6-7.
3. Slow or no flow in the proximal right vertebral artery,
normalized by the upper cervical spine. Could patient's positional
dizziness be related to vertebrobasilar insufficiency?

## 2017-09-27 IMAGING — RF DG CHOLANGIOGRAM OPERATIVE
1 series · 12 of 12 positions shown · non-contrast
Comparison: None.

CLINICAL DATA: Cholecystostomy

EXAM:
INTRAOPERATIVE CHOLANGIOGRAM
TECHNIQUE: Cholangiographic images from the C-arm fluoroscopic device were
submitted for interpretation post-operatively. Please see the
procedural report for the amount of contrast and the fluoroscopy
time utilized.

[Series 1: run · 6 acquisitions, 12 frames shown]
[im 1/6]
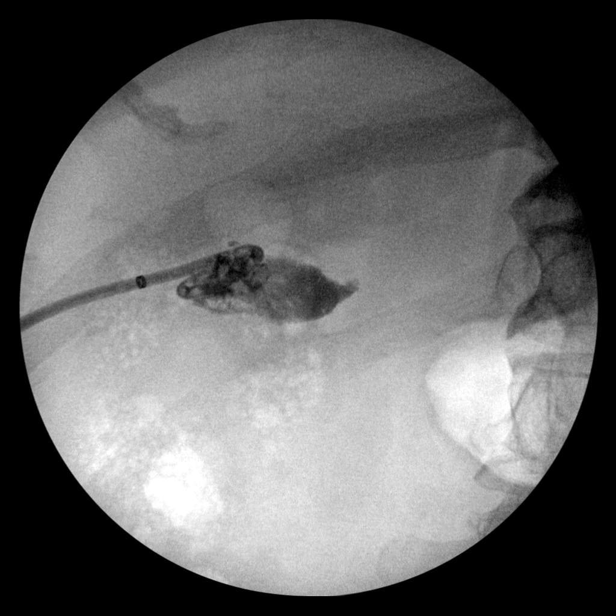
[im 2/6]
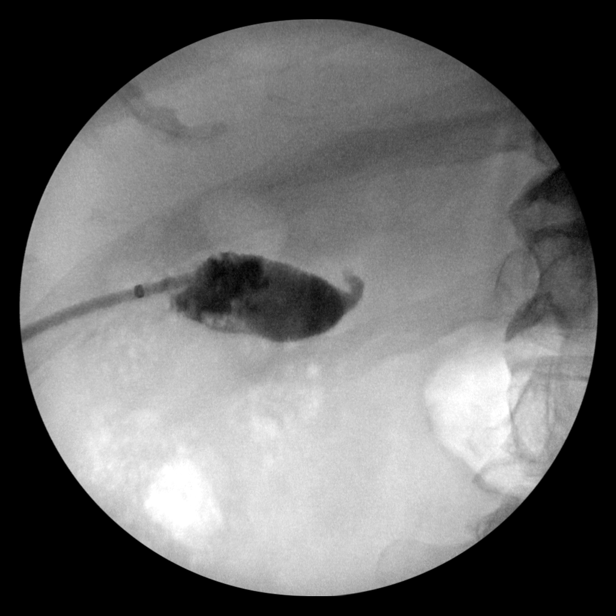
[im 2/6]
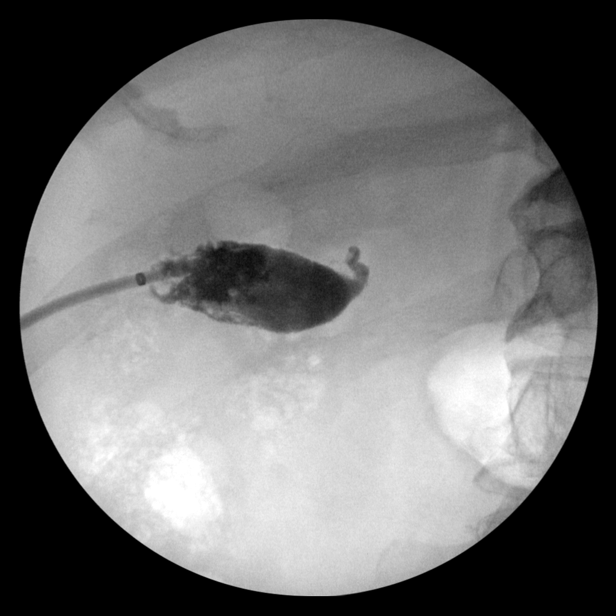
[im 2/6]
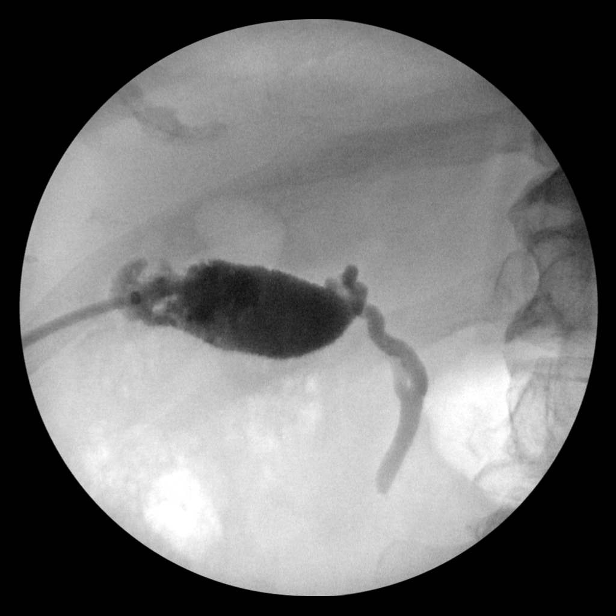
[im 2/6]
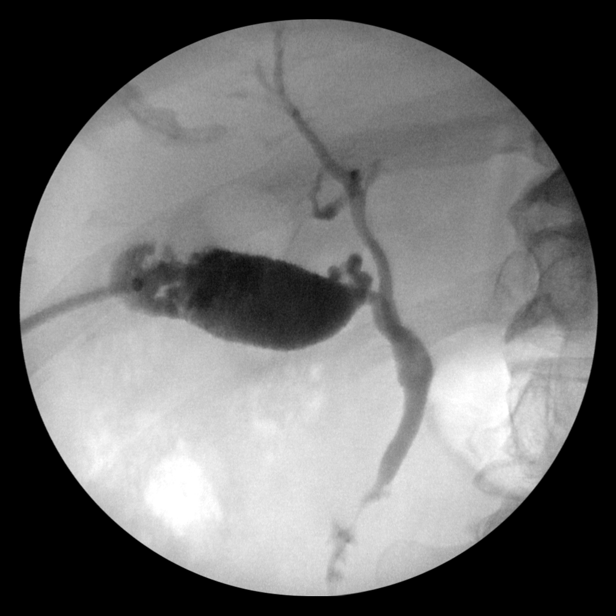
[im 3/6]
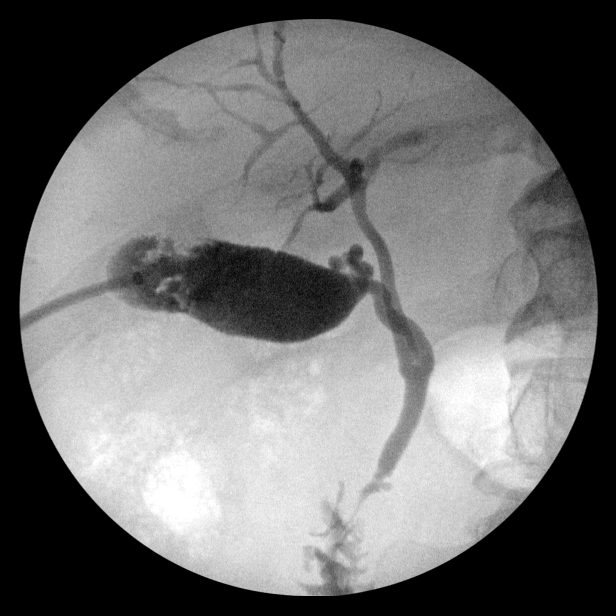
[im 4/6]
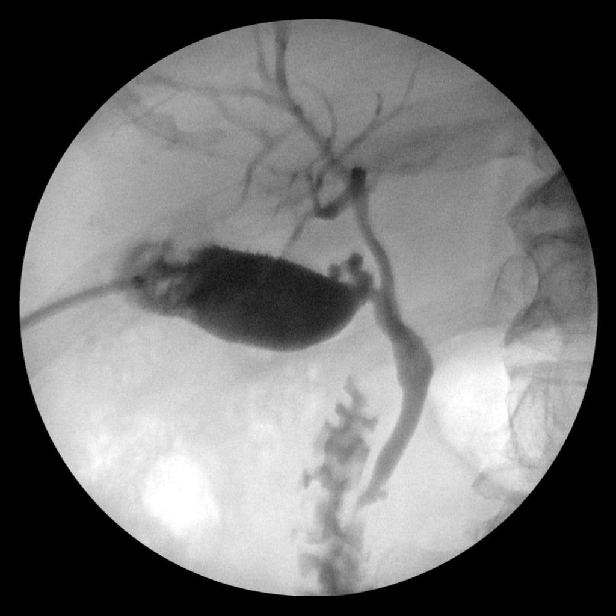
[im 4/6]
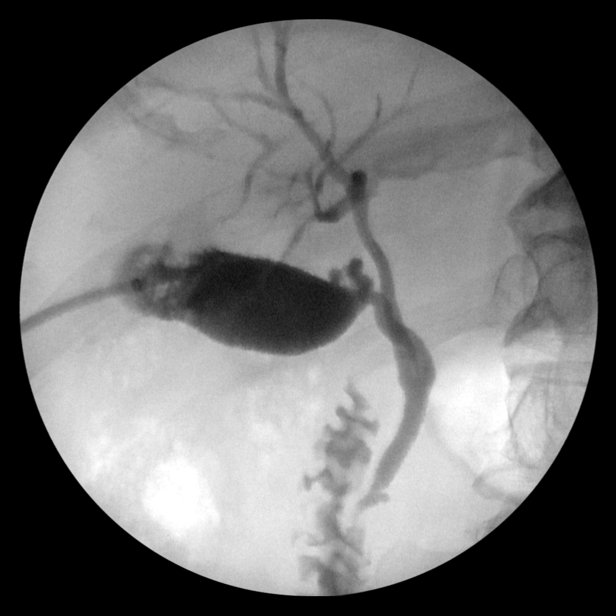
[im 4/6]
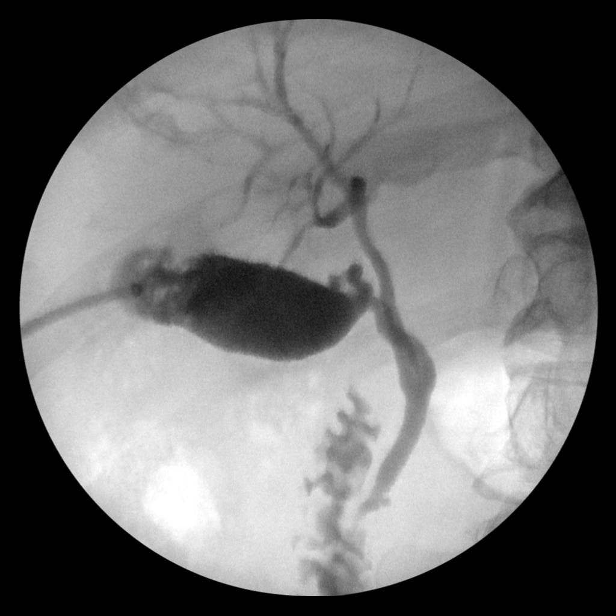
[im 4/6]
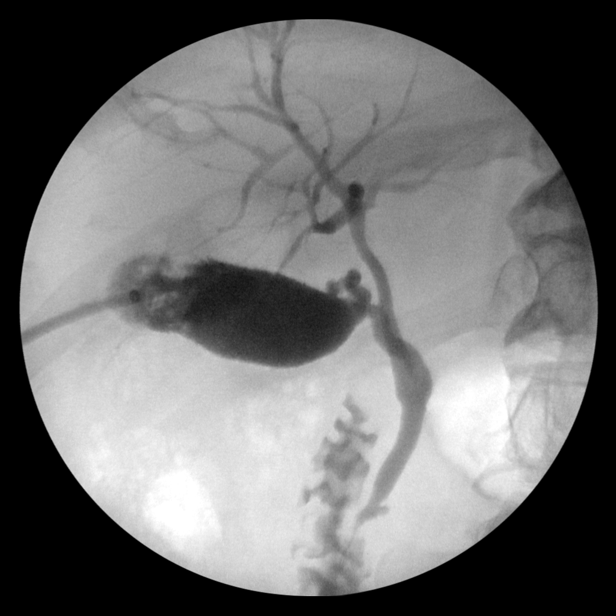
[im 5/6]
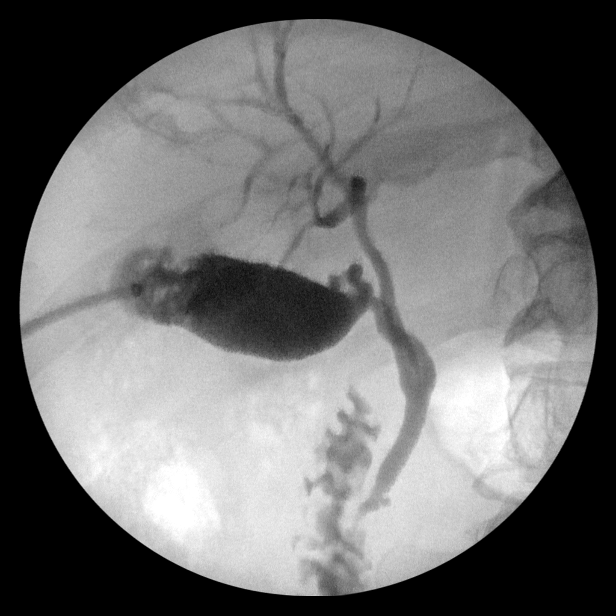
[im 6/6]
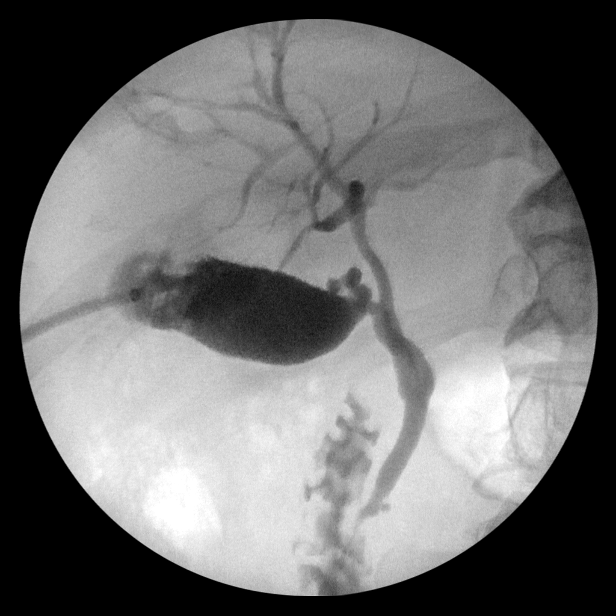

[12 of 12 positions shown; findings below may reference images not displayed]

FINDINGS: Contrast was injected into the existing cholecystostomy tube.
Multiple gallstones are present. Contrast fills the cystic duct,
common bile duct, and duodenum compatible with patency. There is no
evidence of contrast extravasation.
IMPRESSION: Cholelithiasis.

Cystic and common bile ducts are patent.

## 2017-10-01 DIAGNOSIS — I739 Peripheral vascular disease, unspecified: Secondary | ICD-10-CM | POA: Insufficient documentation

## 2017-10-01 DIAGNOSIS — I10 Essential (primary) hypertension: Secondary | ICD-10-CM | POA: Diagnosis not present

## 2017-10-01 DIAGNOSIS — E7800 Pure hypercholesterolemia, unspecified: Secondary | ICD-10-CM | POA: Diagnosis not present

## 2017-10-01 DIAGNOSIS — I2510 Atherosclerotic heart disease of native coronary artery without angina pectoris: Secondary | ICD-10-CM | POA: Diagnosis not present

## 2017-11-24 DIAGNOSIS — I10 Essential (primary) hypertension: Secondary | ICD-10-CM | POA: Diagnosis not present

## 2017-11-24 DIAGNOSIS — E119 Type 2 diabetes mellitus without complications: Secondary | ICD-10-CM | POA: Diagnosis not present

## 2017-11-24 DIAGNOSIS — E7800 Pure hypercholesterolemia, unspecified: Secondary | ICD-10-CM | POA: Diagnosis not present

## 2017-12-01 DIAGNOSIS — I10 Essential (primary) hypertension: Secondary | ICD-10-CM | POA: Diagnosis not present

## 2017-12-01 DIAGNOSIS — E119 Type 2 diabetes mellitus without complications: Secondary | ICD-10-CM | POA: Diagnosis not present

## 2017-12-01 DIAGNOSIS — E7800 Pure hypercholesterolemia, unspecified: Secondary | ICD-10-CM | POA: Diagnosis not present

## 2018-03-23 DIAGNOSIS — I739 Peripheral vascular disease, unspecified: Secondary | ICD-10-CM | POA: Diagnosis not present

## 2018-03-23 DIAGNOSIS — I2510 Atherosclerotic heart disease of native coronary artery without angina pectoris: Secondary | ICD-10-CM | POA: Diagnosis not present

## 2018-03-23 DIAGNOSIS — I10 Essential (primary) hypertension: Secondary | ICD-10-CM | POA: Diagnosis not present

## 2018-04-01 DIAGNOSIS — I2510 Atherosclerotic heart disease of native coronary artery without angina pectoris: Secondary | ICD-10-CM | POA: Diagnosis not present

## 2018-04-01 DIAGNOSIS — I739 Peripheral vascular disease, unspecified: Secondary | ICD-10-CM | POA: Diagnosis not present

## 2018-04-01 DIAGNOSIS — I10 Essential (primary) hypertension: Secondary | ICD-10-CM | POA: Diagnosis not present

## 2018-04-05 DIAGNOSIS — I2510 Atherosclerotic heart disease of native coronary artery without angina pectoris: Secondary | ICD-10-CM | POA: Diagnosis not present

## 2018-04-05 DIAGNOSIS — I739 Peripheral vascular disease, unspecified: Secondary | ICD-10-CM | POA: Diagnosis not present

## 2018-04-05 DIAGNOSIS — I10 Essential (primary) hypertension: Secondary | ICD-10-CM | POA: Diagnosis not present

## 2018-04-05 DIAGNOSIS — E7800 Pure hypercholesterolemia, unspecified: Secondary | ICD-10-CM | POA: Diagnosis not present

## 2018-05-27 DIAGNOSIS — E119 Type 2 diabetes mellitus without complications: Secondary | ICD-10-CM | POA: Diagnosis not present

## 2018-05-27 DIAGNOSIS — E7800 Pure hypercholesterolemia, unspecified: Secondary | ICD-10-CM | POA: Diagnosis not present

## 2018-05-27 DIAGNOSIS — I10 Essential (primary) hypertension: Secondary | ICD-10-CM | POA: Diagnosis not present

## 2018-06-02 DIAGNOSIS — E7800 Pure hypercholesterolemia, unspecified: Secondary | ICD-10-CM | POA: Diagnosis not present

## 2018-06-02 DIAGNOSIS — E1169 Type 2 diabetes mellitus with other specified complication: Secondary | ICD-10-CM | POA: Diagnosis not present

## 2018-06-02 DIAGNOSIS — E785 Hyperlipidemia, unspecified: Secondary | ICD-10-CM | POA: Diagnosis not present

## 2018-06-02 DIAGNOSIS — I10 Essential (primary) hypertension: Secondary | ICD-10-CM | POA: Diagnosis not present

## 2018-10-04 DIAGNOSIS — E785 Hyperlipidemia, unspecified: Secondary | ICD-10-CM | POA: Diagnosis not present

## 2018-10-04 DIAGNOSIS — E7800 Pure hypercholesterolemia, unspecified: Secondary | ICD-10-CM | POA: Diagnosis not present

## 2018-10-04 DIAGNOSIS — E1169 Type 2 diabetes mellitus with other specified complication: Secondary | ICD-10-CM | POA: Diagnosis not present

## 2018-10-04 DIAGNOSIS — I2510 Atherosclerotic heart disease of native coronary artery without angina pectoris: Secondary | ICD-10-CM | POA: Diagnosis not present

## 2018-10-04 DIAGNOSIS — I10 Essential (primary) hypertension: Secondary | ICD-10-CM | POA: Diagnosis not present

## 2018-10-04 DIAGNOSIS — R079 Chest pain, unspecified: Secondary | ICD-10-CM | POA: Diagnosis not present

## 2018-11-25 DIAGNOSIS — I10 Essential (primary) hypertension: Secondary | ICD-10-CM | POA: Diagnosis not present

## 2018-11-25 DIAGNOSIS — E785 Hyperlipidemia, unspecified: Secondary | ICD-10-CM | POA: Diagnosis not present

## 2018-11-25 DIAGNOSIS — E1169 Type 2 diabetes mellitus with other specified complication: Secondary | ICD-10-CM | POA: Diagnosis not present

## 2018-11-25 DIAGNOSIS — Z1159 Encounter for screening for other viral diseases: Secondary | ICD-10-CM | POA: Diagnosis not present

## 2018-11-25 DIAGNOSIS — E7800 Pure hypercholesterolemia, unspecified: Secondary | ICD-10-CM | POA: Diagnosis not present

## 2018-12-02 DIAGNOSIS — M1712 Unilateral primary osteoarthritis, left knee: Secondary | ICD-10-CM | POA: Diagnosis not present

## 2018-12-02 DIAGNOSIS — B182 Chronic viral hepatitis C: Secondary | ICD-10-CM | POA: Diagnosis not present

## 2018-12-02 DIAGNOSIS — E7800 Pure hypercholesterolemia, unspecified: Secondary | ICD-10-CM | POA: Diagnosis not present

## 2018-12-02 DIAGNOSIS — E1169 Type 2 diabetes mellitus with other specified complication: Secondary | ICD-10-CM | POA: Diagnosis not present

## 2018-12-02 DIAGNOSIS — E785 Hyperlipidemia, unspecified: Secondary | ICD-10-CM | POA: Diagnosis not present

## 2018-12-02 DIAGNOSIS — M25562 Pain in left knee: Secondary | ICD-10-CM | POA: Diagnosis not present

## 2018-12-14 ENCOUNTER — Telehealth: Payer: Self-pay | Admitting: Internal Medicine

## 2018-12-14 NOTE — Telephone Encounter (Signed)
I went to talk with the patient he had exited the building. I waited he came back in and stated that I had spoke with him regarding his appointment. I informed him that I did not know he had obtained another primary care physician . I asked the reason for returning to this practice he stated that he was diagnosed with a liver disease and the doctor that he left informed him to labs done by the provider he was going to see. I proceeded to talk. The patient stated wait a minute until I finish what I needed to say as to the reason I left. He continued to talk stating that if he was a Latino or a minority that he could have got an appointment sooner to follow up with Dr. Netty Starring . I informed the patient that I would send a note back to Dr. Derrel Nip to see if she would see him , due to him seeing another physician and she had not see him in 73yrs. The patient stated what are you trying to say you don't want my business. I informed the patient that was not the case and that I would send a message to Dr. Derrel Nip . He said,"cut to the chase why don't you say what you really mean. I stated I did say what I ment and that was I will send a message to the provider. The patient stated I can read what your saying and that he was in the [place] and he new how to read what a person was saying. I reiterated that I would send a message to the provider. He stated that he did not have to see Dr. Derrel Nip he would see one of the other girls they said you have a nurse practitioner or a MD. I said, "allow me to send a message back to ask the provider if she will take you on as her patient. He stated I can see whoever I want to see. I informed him that it is left up to the provider. He stated that he will call his insurance agent. I stated that would be ok to call his insurance company. He came back to say I said insurance agent. He stated that if he were Latino or of a different race that  he would get service and they call this [place] and  being who you are that you should understand. I informed him that his appointment would be canceled. He continued to speak in rage and I stated to him to leave the building . He got in a confrontation with a patient in the lobby ,because of his racist language . I informed him he needed to leave the building now.

## 2018-12-14 NOTE — Telephone Encounter (Signed)
Pt came into the office today checking on his appt and stated that he is coming back to see Dr. Derrel Nip from [facility].I looked In the chart and didn't see a message. I told the patient that I would need to send a message back since he est care elsewhere to see if he could re-establish with Dr. Derrel Nip. Pt then got very anger and said that I needed to quite beating around the bush and just tell him what the hell I meant by sending a message back. I told him again what I was doing and why. Pt asked why I need to ask all the question and I told him that it was our protocol.  He then said that this is [place] and he could chose who he wanted to see without me asking. And this was a bunch of F..king bull.  I then told him I would get my Team lead to come talk to him

## 2019-01-10 ENCOUNTER — Ambulatory Visit: Payer: PPO | Admitting: Internal Medicine

## 2019-01-28 ENCOUNTER — Telehealth: Payer: Self-pay

## 2019-01-28 NOTE — Telephone Encounter (Signed)
Confirmed appointment with patient. klh °

## 2019-02-01 ENCOUNTER — Ambulatory Visit (INDEPENDENT_AMBULATORY_CARE_PROVIDER_SITE_OTHER): Payer: PPO | Admitting: Adult Health

## 2019-02-01 ENCOUNTER — Other Ambulatory Visit: Payer: Self-pay

## 2019-02-01 VITALS — BP 180/100 | HR 67 | Temp 95.8°F | Resp 18 | Ht 71.0 in | Wt 215.0 lb

## 2019-02-01 DIAGNOSIS — E782 Mixed hyperlipidemia: Secondary | ICD-10-CM | POA: Diagnosis not present

## 2019-02-01 DIAGNOSIS — I2510 Atherosclerotic heart disease of native coronary artery without angina pectoris: Secondary | ICD-10-CM | POA: Diagnosis not present

## 2019-02-01 DIAGNOSIS — Z8619 Personal history of other infectious and parasitic diseases: Secondary | ICD-10-CM | POA: Diagnosis not present

## 2019-02-01 DIAGNOSIS — I2583 Coronary atherosclerosis due to lipid rich plaque: Secondary | ICD-10-CM | POA: Diagnosis not present

## 2019-02-01 DIAGNOSIS — F339 Major depressive disorder, recurrent, unspecified: Secondary | ICD-10-CM | POA: Diagnosis not present

## 2019-02-01 DIAGNOSIS — I10 Essential (primary) hypertension: Secondary | ICD-10-CM

## 2019-02-01 DIAGNOSIS — B180 Chronic viral hepatitis B with delta-agent: Secondary | ICD-10-CM

## 2019-02-01 NOTE — Progress Notes (Signed)
[facility] [place], [place] [address]  Internal MEDICINE  Office Visit Note  Patient Name: Ricky Abbott  [id number]  [id number]  Date of Service: 02/01/2019   Complaints/HPI Pt is here for establishment of PCP. Chief Complaint  Patient presents with  . New Patient (Initial Visit)  . Hypertension  . Diabetes  . Hyperlipidemia  . Coronary Artery Disease   HPI Pt is here to establish care.  He has a history of HTN, DM, HLD, Hep C, Hep B, and CAD. Overall He is doing well.  He is a former patient of [facility] and is establishing care with [place] at this time. His DM is well controlled and his most recent A1C is 6.8.  He had baseline labs performed two months ago, and is in need of urine microalbumin, and an eye exam. He reports he will call and make appt at this time for eye exam. His BP is elevated today 180/100      Current Medication: Outpatient Encounter Medications as of 02/01/2019  Medication Sig  . amLODipine (NORVASC) 5 MG tablet TAKE 1 TABLET BY MOUTH EVERY DAY  . aspirin 81 MG tablet Take 81 mg by mouth daily. Reported on 02/19/2015  . atorvastatin (LIPITOR) 80 MG tablet Take 1 tablet (80 mg total) by mouth daily.  . carvedilol (COREG) 12.5 MG tablet Take 1 tablet (12.5 mg total) by mouth 2 (two) times daily with a meal.  . clopidogrel (PLAVIX) 75 MG tablet TAKE 1 TABLET(75 MG) BY MOUTH DAILY  . glucose blood test strip Please check sugar once daily  . LORazepam (ATIVAN) 1 MG tablet TAKE 1 TABLET BY MOUTH AT BEDTIME  . nitroGLYCERIN (NITROSTAT) 0.4 MG SL tablet PLACE 1 TABLET UNDER THE TONGUE EVERY 5 MINUTES AS NEEDED FOR CHEST PAIN  . ONE TOUCH LANCETS MISC 1 application by Does not apply route daily. Check sugar once daily.  . pantoprazole (PROTONIX) 40 MG tablet Take 1 tablet (40 mg total) by mouth daily.  . tamsulosin (FLOMAX) 0.4 MG CAPS capsule Take 1 capsule (0.4 mg total) by mouth daily.   No facility-administered encounter  medications on file as of 02/01/2019.    Surgical History: Past Surgical History:  Procedure Laterality Date  . billary tube placement    . [place], [place]  . CARDIAC CATHETERIZATION     [facility]   . CHOLECYSTECTOMY N/A 06/05/2016   Procedure: LAPAROSCOPIC CHOLECYSTECTOMY with cholangiogram;  Surgeon: Jules Husbands, MD;  Location: [facility] ORS;  Service: General;  Laterality: N/A;  . [place], 05/2012   x 2  . IR GENERIC HISTORICAL  04/30/2016   IR PERC CHOLECYSTOSTOMY 04/30/2016 [facility]INTER[facility]    Medical History: Past Medical History:  Diagnosis Date  . Allergy   . Anxiety   . Asthma   . CAD, multiple vessel   . COPD (chronic obstructive pulmonary disease) ([place])   . Depression   . Diabetes mellitus without complication ([place])   . Dyspnea   . GERD (gastroesophageal reflux disease)   . History of chicken pox   . Hyperlipidemia   . Hypertension   . MI (myocardial infarction) ([place])   . Prostate enlargement   . PTSD (post-traumatic stress disorder)    [place] [facility]  . Sarcoidosis of lung ([place])     Family History: Family History  Problem Relation Age of Onset  . Cancer Mother   . Hyperlipidemia Mother   .  Hypertension Mother   . Cancer Father        stomach  . Hyperlipidemia Father   . Hypertension Father   . Cancer Brother        bone cancer    Social History   Socioeconomic History  . Marital status: Single    Spouse name: Not on file  . Number of children: Not on file  . Years of education: Not on file  . Highest education level: Not on file  Occupational History  . Not on file  Tobacco Use  . Smoking status: Former Smoker    Packs/day: 4.00    Years: 25.00    Pack years: 100.00    Types: Cigarettes    Quit date: 03/24/1981    Years since quitting: 37.8  . Smokeless tobacco: Never Used  . Tobacco comment: quit 30 years asgo  Substance and Sexual Activity  . Alcohol use: No  . Drug use: No  .  Sexual activity: Never  Other Topics Concern  . Not on file  Social History Narrative   Yvone Neu grew up in [place], [place]. He moved to the [place] area in 2013. 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   Social Determinants of Health   Financial Resource Strain:   . Difficulty of Paying Living Expenses: Not on file  Food Insecurity:   . Worried About Charity fundraiser in the Last Year: Not on file  . Ran Out of Food in the Last Year: Not on file  Transportation Needs:   . Lack of Transportation (Medical): Not on file  . Lack of Transportation (Non-Medical): Not on file  Physical Activity:   . Days of Exercise per Week: Not on file  . Minutes of Exercise per Session: Not on file  Stress:   . Feeling of Stress : Not on file  Social Connections:   . Frequency of Communication with Friends and Family: Not on file  . Frequency of Social Gatherings with Friends and Family: Not on file  . Attends Religious Services: Not on file  . Active Member of Clubs or Organizations: Not on file  . Attends Archivist Meetings: Not on file  . Marital Status: Not on file  Intimate Partner Violence:   . Fear of Current or Ex-Partner: Not on file  . Emotionally Abused: Not on file  . Physically Abused: Not on file  . Sexually Abused: Not on file     Review of Systems  Constitutional: Negative.  Negative for chills, fatigue and unexpected weight change.  HENT: Negative.  Negative for congestion, rhinorrhea, sneezing and sore throat.   Eyes: Negative for redness.  Respiratory: Negative.  Negative for cough, chest tightness and shortness of breath.   Cardiovascular: Negative.  Negative for chest pain and palpitations.  Gastrointestinal: Negative.  Negative for abdominal pain, constipation, diarrhea, nausea and vomiting.  Endocrine: Negative.   Genitourinary: Negative.  Negative for dysuria and frequency.  Musculoskeletal: Negative.  Negative for arthralgias, back pain, joint swelling and neck  pain.  Skin: Negative.  Negative for rash.  Allergic/Immunologic: Negative.   Neurological: Negative.  Negative for tremors and numbness.  Hematological: Negative for adenopathy. Does not bruise/bleed easily.  Psychiatric/Behavioral: Negative.  Negative for behavioral problems, sleep disturbance and suicidal ideas. The patient is not nervous/anxious.     Vital Signs: Pulse 67   Temp (!) 95.8 F (35.4 C)   Resp 18   Ht 5\' 11"  (1.803 m)   Wt 215 lb (97.5 kg)  BMI 29.99 kg/m    Physical Exam Vitals and nursing note reviewed.  Constitutional:      General: He is not in acute distress.    Appearance: He is well-developed. He is not diaphoretic.  HENT:     Head: Normocephalic and atraumatic.     Mouth/Throat:     Pharynx: No oropharyngeal exudate.  Eyes:     Pupils: Pupils are equal, round, and reactive to light.  Neck:     Thyroid: No thyromegaly.     Vascular: No JVD.     Trachea: No tracheal deviation.  Cardiovascular:     Rate and Rhythm: Normal rate and regular rhythm.     Heart sounds: Normal heart sounds. No murmur. No friction rub. No gallop.   Pulmonary:     Effort: Pulmonary effort is normal. No respiratory distress.     Breath sounds: Normal breath sounds. No wheezing or rales.  Chest:     Chest wall: No tenderness.  Abdominal:     Palpations: Abdomen is soft.     Tenderness: There is no abdominal tenderness. There is no guarding.  Musculoskeletal:        General: Normal range of motion.     Cervical back: Normal range of motion and neck supple.  Lymphadenopathy:     Cervical: No cervical adenopathy.  Skin:    General: Skin is warm and dry.  Neurological:     Mental Status: He is alert and oriented to person, place, and time.     Cranial Nerves: No cranial nerve deficit.  Psychiatric:        Behavior: Behavior normal.        Thought Content: Thought content normal.        Judgment: Judgment normal.     Assessment/Plan: 1. Essential  hypertension Discussed importance of medication compliance.  Increase Norvasc to 10 mg daily.  Continue corge, and follow up in 2 weeks.   2. Mixed hyperlipidemia Controlled, last lipid panel 11/2018  3. Depression, recurrent ([place]) Stable, continue present management.   4. History of hepatitis C virus infection Last Hep C was elevated, he had referral to gastro, but did not feel safe going at start of covid. - Ambulatory referral to Gastroenterology  5. Hepatitis B and delta, chronic, with coma ([place]) - Ambulatory referral to Gastroenterology  6. Coronary artery disease due to lipid rich plaque PT needs to establish care with new provider, he does not wish to go to [facility] clinic anymore.  - Ambulatory referral to Cardiology  General Counseling: lui wageman understanding of the findings of todays visit and agrees with plan of treatment. I have discussed any further diagnostic evaluation that may be needed or ordered today. We also reviewed his medications today. he has been encouraged to call the office with any questions or concerns that should arise related to todays visit.  No orders of the defined types were placed in this encounter.   No orders of the defined types were placed in this encounter.   Time spent: 30 Minutes   This patient was seen by Orson Gear AGNP-C in Collaboration with Dr Lavera Guise as a part of collaborative care agreement  Kendell Bane AGNP-C Internal Medicine

## 2019-02-10 ENCOUNTER — Telehealth: Payer: Self-pay

## 2019-02-10 NOTE — Telephone Encounter (Signed)
CONFIRMED AND SCREENED FOR 02-15-19 OV. 

## 2019-02-15 ENCOUNTER — Encounter: Payer: Self-pay | Admitting: Adult Health

## 2019-02-15 ENCOUNTER — Ambulatory Visit (INDEPENDENT_AMBULATORY_CARE_PROVIDER_SITE_OTHER): Payer: Medicare Other | Admitting: Adult Health

## 2019-02-15 ENCOUNTER — Other Ambulatory Visit: Payer: Self-pay

## 2019-02-15 VITALS — BP 158/84 | HR 84 | Temp 98.1°F | Resp 16 | Ht 71.0 in | Wt 210.8 lb

## 2019-02-15 DIAGNOSIS — F339 Major depressive disorder, recurrent, unspecified: Secondary | ICD-10-CM

## 2019-02-15 DIAGNOSIS — I2510 Atherosclerotic heart disease of native coronary artery without angina pectoris: Secondary | ICD-10-CM

## 2019-02-15 DIAGNOSIS — Z8619 Personal history of other infectious and parasitic diseases: Secondary | ICD-10-CM

## 2019-02-15 DIAGNOSIS — I2583 Coronary atherosclerosis due to lipid rich plaque: Secondary | ICD-10-CM

## 2019-02-15 DIAGNOSIS — E1140 Type 2 diabetes mellitus with diabetic neuropathy, unspecified: Secondary | ICD-10-CM

## 2019-02-15 DIAGNOSIS — I10 Essential (primary) hypertension: Secondary | ICD-10-CM | POA: Diagnosis not present

## 2019-02-15 DIAGNOSIS — E782 Mixed hyperlipidemia: Secondary | ICD-10-CM | POA: Diagnosis not present

## 2019-02-15 DIAGNOSIS — B180 Chronic viral hepatitis B with delta-agent: Secondary | ICD-10-CM

## 2019-02-15 NOTE — Progress Notes (Signed)
[facility] [place], [place] [address]  Internal MEDICINE  Office Visit Note  Patient Name: Ricky Abbott  [id number]  [id number]  Date of Service: 02/15/2019  Chief Complaint  Patient presents with  . Anxiety  . Hypertension  . Hyperlipidemia    HPI  Pt returned today for follow up on HTN, HLD and anxiety.  Overall he is doing well.  His blood pressure is much improved. Denies Chest pain, Shortness of breath, palpitations, headache, or blurred vision. He denies any complaints.  He is doing well.      Current Medication: Outpatient Encounter Medications as of 02/15/2019  Medication Sig  . aspirin 81 MG tablet Take 81 mg by mouth daily. Reported on 02/19/2015  . carvedilol (COREG) 12.5 MG tablet Take 1 tablet (12.5 mg total) by mouth 2 (two) times daily with a meal.  . clopidogrel (PLAVIX) 75 MG tablet TAKE 1 TABLET(75 MG) BY MOUTH DAILY  . glucose blood test strip Please check sugar once daily  . nitroGLYCERIN (NITROSTAT) 0.4 MG SL tablet PLACE 1 TABLET UNDER THE TONGUE EVERY 5 MINUTES AS NEEDED FOR CHEST PAIN  . ONE TOUCH LANCETS MISC 1 application by Does not apply route daily. Check sugar once daily.  . pantoprazole (PROTONIX) 40 MG tablet Take 1 tablet (40 mg total) by mouth daily.  . rosuvastatin (CRESTOR) 20 MG tablet Take by mouth.  . tamsulosin (FLOMAX) 0.4 MG CAPS capsule Take 1 capsule (0.4 mg total) by mouth daily.  Marland Kitchen telmisartan (MICARDIS) 80 MG tablet TAKE 1 TABLET BY MOUTH EVERY DAY  . [DISCONTINUED] amLODipine (NORVASC) 5 MG tablet TAKE 1 TABLET BY MOUTH EVERY DAY (Patient not taking: Reported on 02/15/2019)  . [DISCONTINUED] atorvastatin (LIPITOR) 80 MG tablet Take 1 tablet (80 mg total) by mouth daily. (Patient not taking: Reported on 02/15/2019)  . [DISCONTINUED] LORazepam (ATIVAN) 1 MG tablet TAKE 1 TABLET BY MOUTH AT BEDTIME (Patient not taking: Reported on 02/15/2019)   No facility-administered encounter medications on file as of 02/15/2019.     Surgical History: Past Surgical History:  Procedure Laterality Date  . billary tube placement    . [place], [place]  . CARDIAC CATHETERIZATION     [facility]   . CHOLECYSTECTOMY N/A 06/05/2016   Procedure: LAPAROSCOPIC CHOLECYSTECTOMY with cholangiogram;  Surgeon: Jules Husbands, MD;  Location: [facility] ORS;  Service: General;  Laterality: N/A;  . [place], 05/2012   x 2  . IR GENERIC HISTORICAL  04/30/2016   IR PERC CHOLECYSTOSTOMY 04/30/2016 [facility]-INTERV RAD    Medical History: Past Medical History:  Diagnosis Date  . Allergy   . Anxiety   . Asthma   . CAD, multiple vessel   . COPD (chronic obstructive pulmonary disease) ([place])   . Depression   . Diabetes mellitus without complication ([place])   . Dyspnea   . GERD (gastroesophageal reflux disease)   . History of chicken pox   . Hyperlipidemia   . Hypertension   . MI (myocardial infarction) ([place])   . Prostate enlargement   . PTSD (post-traumatic stress disorder)    [place] Vet  . Sarcoidosis of lung ([place])     Family History: Family History  Problem Relation Age of Onset  . Cancer Mother   . Hyperlipidemia Mother   . Hypertension Mother   . Cancer Father        stomach  . Hyperlipidemia Father   . Hypertension Father   .  Cancer Brother        bone cancer    Social History   Socioeconomic History  . Marital status: Single    Spouse name: Not on file  . Number of children: Not on file  . Years of education: Not on file  . Highest education level: Not on file  Occupational History  . Not on file  Tobacco Use  . Smoking status: Former Smoker    Packs/day: 4.00    Years: 25.00    Pack years: 100.00    Types: Cigarettes    Quit date: 03/24/1981    Years since quitting: 37.9  . Smokeless tobacco: Never Used  . Tobacco comment: quit 30 years asgo  Substance and Sexual Activity  . Alcohol use: No  . Drug use: No  . Sexual activity: Never  Other Topics  Concern  . Not on file  Social History Narrative   Yvone Neu grew up in [place], [place]. He moved to the [place] area in 2013. 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   Social Determinants of Health   Financial Resource Strain:   . Difficulty of Paying Living Expenses: Not on file  Food Insecurity:   . Worried About Charity fundraiser in the Last Year: Not on file  . Ran Out of Food in the Last Year: Not on file  Transportation Needs:   . Lack of Transportation (Medical): Not on file  . Lack of Transportation (Non-Medical): Not on file  Physical Activity:   . Days of Exercise per Week: Not on file  . Minutes of Exercise per Session: Not on file  Stress:   . Feeling of Stress : Not on file  Social Connections:   . Frequency of Communication with Friends and Family: Not on file  . Frequency of Social Gatherings with Friends and Family: Not on file  . Attends Religious Services: Not on file  . Active Member of Clubs or Organizations: Not on file  . Attends Archivist Meetings: Not on file  . Marital Status: Not on file  Intimate Partner Violence:   . Fear of Current or Ex-Partner: Not on file  . Emotionally Abused: Not on file  . Physically Abused: Not on file  . Sexually Abused: Not on file      Review of Systems  Constitutional: Negative.  Negative for chills, fatigue and unexpected weight change.  HENT: Negative.  Negative for congestion, rhinorrhea, sneezing and sore throat.   Eyes: Negative for redness.  Respiratory: Negative.  Negative for cough, chest tightness and shortness of breath.   Cardiovascular: Negative.  Negative for chest pain and palpitations.  Gastrointestinal: Negative.  Negative for abdominal pain, constipation, diarrhea, nausea and vomiting.  Endocrine: Negative.   Genitourinary: Negative.  Negative for dysuria and frequency.  Musculoskeletal: Negative.  Negative for arthralgias, back pain, joint swelling and neck pain.  Skin: Negative.  Negative  for rash.  Allergic/Immunologic: Negative.   Neurological: Negative.  Negative for tremors and numbness.  Hematological: Negative for adenopathy. Does not bruise/bleed easily.  Psychiatric/Behavioral: Negative.  Negative for behavioral problems, sleep disturbance and suicidal ideas. The patient is not nervous/anxious.     Vital Signs: BP (!) 158/84   Pulse 84   Temp 98.1 F (36.7 C)   Resp 16   Ht 5\' 11"  (1.803 m)   Wt 210 lb 12.8 oz (95.6 kg)   SpO2 98%   BMI 29.40 kg/m    Physical Exam Vitals and nursing note reviewed.  Constitutional:      General: He is not in acute distress.    Appearance: He is well-developed. He is not diaphoretic.  HENT:     Head: Normocephalic and atraumatic.     Mouth/Throat:     Pharynx: No oropharyngeal exudate.  Eyes:     Pupils: Pupils are equal, round, and reactive to light.  Neck:     Thyroid: No thyromegaly.     Vascular: No JVD.     Trachea: No tracheal deviation.  Cardiovascular:     Rate and Rhythm: Normal rate and regular rhythm.     Heart sounds: Normal heart sounds. No murmur. No friction rub. No gallop.   Pulmonary:     Effort: Pulmonary effort is normal. No respiratory distress.     Breath sounds: Normal breath sounds. No wheezing or rales.  Chest:     Chest wall: No tenderness.  Abdominal:     Palpations: Abdomen is soft.     Tenderness: There is no abdominal tenderness. There is no guarding.  Musculoskeletal:        General: Normal range of motion.     Cervical back: Normal range of motion and neck supple.  Lymphadenopathy:     Cervical: No cervical adenopathy.  Skin:    General: Skin is warm and dry.  Neurological:     Mental Status: He is alert and oriented to person, place, and time.     Cranial Nerves: No cranial nerve deficit.  Psychiatric:        Behavior: Behavior normal.        Thought Content: Thought content normal.        Judgment: Judgment normal.     Assessment/Plan: 1. Essential  hypertension Continue medications as directed, slightly elevated today, but much improved.    2. Mixed hyperlipidemia Stable, continue present management.   3. Depression, recurrent (HCC) Overall at baseline.  Continue as before.  4. Type 2 diabetes mellitus with diabetic neuropathy, without long-term current use of insulin ([place]) Controlled, continue present management.   5. History of hepatitis C virus infection See Gi next month as scheduled.   6. Hepatitis B and delta, chronic, with coma ([place]) See GI next month as scheduled.  7. Coronary artery disease due to lipid rich plaque See Dr Rockey Situ as scheduled to re-establish care.  General Counseling: tavari covalt understanding of the findings of todays visit and agrees with plan of treatment. I have discussed any further diagnostic evaluation that may be needed or ordered today. We also reviewed his medications today. he has been encouraged to call the office with any questions or concerns that should arise related to todays visit.    No orders of the defined types were placed in this encounter.   No orders of the defined types were placed in this encounter.   Time spent: 25 Minutes   This patient was seen by Orson Gear AGNP-C in Collaboration with Dr Lavera Guise as a part of collaborative care agreement     Kendell Bane AGNP-C Internal medicine

## 2019-02-21 ENCOUNTER — Other Ambulatory Visit: Payer: Self-pay

## 2019-02-21 MED ORDER — TELMISARTAN 80 MG PO TABS: 80.0000 mg | ORAL_TABLET | Freq: Every day | ORAL | 1 refills | Status: DC

## 2019-03-02 ENCOUNTER — Telehealth: Payer: Self-pay

## 2019-03-02 ENCOUNTER — Telehealth: Payer: Self-pay | Admitting: Cardiovascular Disease

## 2019-03-02 NOTE — Telephone Encounter (Signed)
Patient has been called and states he will be coming to our office for further visits.

## 2019-03-02 NOTE — Telephone Encounter (Signed)
Lmom to call back. 

## 2019-03-02 NOTE — Telephone Encounter (Signed)
-----   Message from Britt Bottom, [place] sent at 03/01/2019  4:24 PM EST ----- Pt has f/u with Dr.Gollan 03/08/2019 pt last seen 11/2016.  I was looking in pt's chart and looks like pt has been following up with [facility] cardiology last seen 09/2018. Please advise if pt will be continuing with [facility] or will be switching his care to [place].  Thank you, Lenda Kelp

## 2019-03-02 NOTE — Telephone Encounter (Signed)
-----   Message from Britt Bottom, [place] sent at 03/01/2019  4:24 PM EST ----- Pt has f/u with Dr.Gollan 03/08/2019 pt last seen 11/2016.  I was looking in pt's chart and looks like pt has been following up with [facility] cardiology last seen 09/2018. Please advise if pt will be continuing with [facility] or will be switching his care to [place].  Thank you, Lenda Kelp

## 2019-03-06 NOTE — Progress Notes (Signed)
Cardiology Office Note  Alcohol abuse Date:  03/08/2019   ID:  Ricky Abbott, DOB Oct 22, 1945, MRN [id number]  PCP:  Kendell Bane, NP   Chief Complaint  Patient presents with  . Other    12 month follow up. patient c.o chest pain. He stated that he has been taking nitro pretty regularly. Meds reviewed verbally with patient.     HPI:  74 yo caucasian male with  diabetes type 2,  PTSD,   coronary artery disease with stent placed 20 years ago in [place],  [place] in April 2014 presenting to [facility] with stent placed at that time  smoking, quit in 1984,  alcohol, depression, Long history of drinking 40 ounces beers Previous insurance and financial issues affecting his follow-up in clinic and medication compliance.   Previously in [place] Presents for routine follow-up for chest pain, coronary artery disease  Labs in 11/2018 HBA1C 6.8 CR 1.0 LFTS normal Total chol 165, LDL 108  Inactive, sedentary  EKG personally reviewed by myself on todays visit Shows NSR rate 80 bpm, no significant ST or T wave changes  Other past medical hx stress test November 2015 showing no ischemia, there was old scar in the mid to apical inferior wall   successful treatment of his hepatitis Previous problem with his drinking and PTSD, outburst in his apartment complex, neighbors got mad at him   he reports having back pain and chest pain April 2014. He was taken to [facility], transferred by helicopter to [facility].    history of  PTSD. 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 Has never been treated for this.    PMH:   has a past medical history of Allergy, Anxiety, Asthma, CAD, multiple vessel, COPD (chronic obstructive pulmonary disease) ([place]), Depression, Diabetes mellitus without complication ([place]), Dyspnea, GERD (gastroesophageal reflux disease), History of chicken pox, Hyperlipidemia, Hypertension, MI (myocardial infarction) ([place]), Prostate enlargement, PTSD (post-traumatic stress  disorder), and Sarcoidosis of lung ([place]).  PSH:    Past Surgical History:  Procedure Laterality Date  . billary tube placement    . [place], [place]  . CARDIAC CATHETERIZATION     [facility]   . CHOLECYSTECTOMY N/A 06/05/2016   Procedure: LAPAROSCOPIC CHOLECYSTECTOMY with cholangiogram;  Surgeon: Jules Husbands, MD;  Location: [facility] ORS;  Service: General;  Laterality: N/A;  . [place], 05/2012   x 2  . IR GENERIC HISTORICAL  04/30/2016   IR PERC CHOLECYSTOSTOMY 04/30/2016 [facility]-INTERV RAD    Current Outpatient Medications  Medication Sig Dispense Refill  . aspirin 81 MG tablet Take 81 mg by mouth daily. Reported on 02/19/2015    . carvedilol (COREG) 12.5 MG tablet Take 1 tablet (12.5 mg total) by mouth 2 (two) times daily with a meal. 180 tablet 1  . clopidogrel (PLAVIX) 75 MG tablet TAKE 1 TABLET(75 MG) BY MOUTH DAILY 90 tablet 1  . nitroGLYCERIN (NITROSTAT) 0.4 MG SL tablet PLACE 1 TABLET UNDER THE TONGUE EVERY 5 MINUTES AS NEEDED FOR CHEST PAIN 75 tablet 1  . ONE TOUCH LANCETS MISC 1 application by Does not apply route daily. Check sugar once daily. 100 each 1  . pantoprazole (PROTONIX) 40 MG tablet Take 1 tablet (40 mg total) by mouth daily. 30 tablet 3  . rosuvastatin (CRESTOR) 20 MG tablet Take by mouth.    . tamsulosin (FLOMAX) 0.4 MG CAPS capsule Take 1 capsule (0.4 mg total) by mouth daily.  30 capsule 3  . telmisartan (MICARDIS) 80 MG tablet Take 1 tablet (80 mg total) by mouth daily. 90 tablet 1   No current facility-administered medications for this visit.    Allergies:   Peanut-containing drug products   Social History:  The patient  reports that he quit smoking about 37 years ago. His smoking use included cigarettes. He has a 100.00 pack-year smoking history. He has never used smokeless tobacco. He reports that he does not drink alcohol or use drugs.   Family History:   family history includes Cancer in his brother,  father, and mother; Hyperlipidemia in his father and mother; Hypertension in his father and mother.    Review of Systems: Review of Systems  Constitutional: Negative.   HENT: Negative.   Respiratory: Negative.   Cardiovascular: Negative.   Gastrointestinal: Negative.   Musculoskeletal: Negative.   Neurological: Negative.        Leg burning with walking  Psychiatric/Behavioral: Negative.   All other systems reviewed and are negative.   PHYSICAL EXAM: VS:  BP (!) 162/70 (BP Location: Left Arm, Patient Position: Sitting, Cuff Size: Normal)   Pulse 80   Ht 5\' 11"  (1.803 m)   Wt 212 lb (96.2 kg)   BMI 29.57 kg/m  , BMI Body mass index is 29.57 kg/m. GEN: Well nourished, well developed, in no acute distress  HEENT: normal  Neck: no JVD, carotid bruits, or masses Cardiac: RRR; no murmurs, rubs, or gallops,no edema unable to appreciate pulse right lower extremity DP or PT Respiratory:  clear to auscultation bilaterally, normal work of breathing GI: soft, nontender, nondistended, + BS MS: no deformity or atrophy  Skin: warm and dry, no rash Neuro:  Strength and sensation are intact Psych: euthymic mood, full affect   Recent Labs: No results found for requested labs within last 8760 hours.    Lipid Panel Lab Results  Component Value Date   CHOL 148 10/16/2016   HDL 31.20 (L) 10/16/2016   LDLCALC 97 10/16/2016   TRIG 101.0 10/16/2016      Wt Readings from Last 3 Encounters:  03/08/19 212 lb (96.2 kg)  02/15/19 210 lb 12.8 oz (95.6 kg)  02/01/19 215 lb (97.5 kg)      ASSESSMENT AND PLAN:  CAD, multiple vessel   Prior atypical chest pain Stay on crestor We will add zetia for goal LDL <70  Pure hypercholesterolemia -  crestor Add zetia , goal LDL <70  Essential hypertension -  Mildly elevated, suggested he monitor at home  Claudication in peripheral vascular disease ([place]) -   lower extremity Doppler with minimal disease on the right Minimal carotid  disease  H/O burning pain in leg  neuropathy , previous lower extremity arterial Doppler was essentially normal  PTSD (post-traumatic stress disorder) Does not want to be managed by the [facility] hospital Again today  Type 2 diabetes, HbA1c goal < 7% (HCC) Hemoglobin A1c 6.8 He is monitoring diet Suggested he start walking ptogram  Alcohol abuse Previous history of alcohol abuse.  Recommended alcohol cessation   Total encounter time more than 25 minutes  Greater than 50% was spent in counseling and coordination of care with the patient    Orders Placed This Encounter  Procedures  . EKG 12-Lead    Signed, Esmond Plants, M.D., Ph.D. 03/08/2019  [place], [place]

## 2019-03-08 ENCOUNTER — Encounter: Payer: Self-pay | Admitting: Cardiovascular Disease

## 2019-03-08 ENCOUNTER — Ambulatory Visit: Payer: PPO | Admitting: Cardiovascular Disease

## 2019-03-08 ENCOUNTER — Other Ambulatory Visit: Payer: Self-pay

## 2019-03-08 VITALS — BP 142/70 | HR 80 | Ht 71.0 in | Wt 212.0 lb

## 2019-03-08 DIAGNOSIS — G450 Vertebro-basilar artery syndrome: Secondary | ICD-10-CM | POA: Diagnosis not present

## 2019-03-08 DIAGNOSIS — E782 Mixed hyperlipidemia: Secondary | ICD-10-CM | POA: Diagnosis not present

## 2019-03-08 DIAGNOSIS — I10 Essential (primary) hypertension: Secondary | ICD-10-CM | POA: Diagnosis not present

## 2019-03-08 DIAGNOSIS — I2510 Atherosclerotic heart disease of native coronary artery without angina pectoris: Secondary | ICD-10-CM

## 2019-03-08 MED ORDER — EZETIMIBE 10 MG PO TABS: 10.0000 mg | ORAL_TABLET | Freq: Every day | ORAL | 3 refills | Status: DC

## 2019-03-08 NOTE — Patient Instructions (Addendum)
Medication Instructions:   Please start zetia for choloesterol Stay on crestor/rosuvastatin   If you need a refill on your cardiac medications before your next appointment, please call your pharmacy.    Lab work: No new labs needed   If you have labs (blood work) drawn today and your tests are completely normal, you will receive your results only by: Marland Kitchen MyChart Message (if you have MyChart) OR . A paper copy in the mail If you have any lab test that is abnormal or we need to change your treatment, we will call you to review the results.   Testing/Procedures: No new testing needed   Follow-Up: At [facility], you and your health needs are our priority.  As part of our continuing mission to provide you with exceptional heart care, we have created designated Provider Care Teams.  These Care Teams include your primary Cardiologist (physician) and Advanced Practice Providers (APPs -  Physician Assistants and Nurse Practitioners) who all work together to provide you with the care you need, when you need it.  . You will need a follow up appointment in 12 months   . Providers on your designated Care Team:   . Murray Hodgkins, NP . Christell Faith, PA-C . Marrianne Mood, PA-C  Any Other Special Instructions Will Be Listed Below (If Applicable).  For educational health videos Log in to : www.myemmi.com Or : [email or web address], password : [email or web address]

## 2019-03-23 ENCOUNTER — Other Ambulatory Visit: Payer: Self-pay

## 2019-03-23 MED ORDER — ROSUVASTATIN CALCIUM 20 MG PO TABS: 20.0000 mg | ORAL_TABLET | Freq: Every day | ORAL | 1 refills | Status: DC

## 2019-04-05 ENCOUNTER — Other Ambulatory Visit: Payer: Self-pay

## 2019-04-05 ENCOUNTER — Ambulatory Visit: Payer: Medicare Other | Admitting: Gastroenterology

## 2019-04-05 VITALS — BP 136/82 | Temp 98.1°F | Ht 71.0 in | Wt 208.8 lb

## 2019-04-05 DIAGNOSIS — R768 Other specified abnormal immunological findings in serum: Secondary | ICD-10-CM

## 2019-04-05 NOTE — Progress Notes (Signed)
Jonathon Bellows MD, MRCP(U.K) [address]  [place]  [place], [place] [address]  Main: [phone]  Fax: [phone]   Gastroenterology Consultation  Referring Provider:     Kendell Bane, NP Primary Care Physician:  Kendell Bane, NP Primary Gastroenterologist:  Dr. Jonathon Bellows  Reason for Consultation:     Referred for hepatitis B and C        HPI:   Ricky Abbott is a 74 y.o. y/o male referred for consultation & management  By  Kendell Bane, NP.     Referred for hepatitis B and C  11/25/2018: Hepatitis C virus antibody positive.  Liver transaminases and functions normal.Hemoglobin on 11/25/2018 was 15 g..  I cannot find any lab results for hepatitis B.  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  He states that over 20 years back he was treated with interferon and ribavirin for both hepatitis C and B.  He recalls the gastroenterologist told him that he had been cured.  He has had a tattoo which is received in the [employer]  Nonprofessional.  He also recollects that he might have contracted hepatitis B and C from "truck stops".   No other complaints presently  Past Medical History:  Diagnosis Date  . Allergy   . Anxiety   . Asthma   . CAD, multiple vessel   . COPD (chronic obstructive pulmonary disease) ([place])   . Depression   . Diabetes mellitus without complication ([place])   . Dyspnea   . GERD (gastroesophageal reflux disease)   . History of chicken pox   . Hyperlipidemia   . Hypertension   . MI (myocardial infarction) ([place])   . Prostate enlargement   . PTSD (post-traumatic stress disorder)    [place] Vet  . Sarcoidosis of lung [facility])     Past Surgical History:  Procedure Laterality Date  . billary tube placement    . [place], [place]  . CARDIAC CATHETERIZATION     [facility]   . CHOLECYSTECTOMY N/A 06/05/2016   Procedure: LAPAROSCOPIC CHOLECYSTECTOMY with cholangiogram;  Surgeon: Jules Husbands,  MD;  Location: [facility] ORS;  Service: General;  Laterality: N/A;  . [place], 05/2012   x 2  . IR GENERIC HISTORICAL  04/30/2016   IR PERC CHOLECYSTOSTOMY 04/30/2016 [facility]INTER[facility] RAD    Prior to Admission medications   Medication Sig Start Date End Date Taking? Authorizing Provider  aspirin 81 MG tablet Take 81 mg by mouth daily. Reported on 02/19/2015 09/02/13   Minna Merritts, MD  carvedilol (COREG) 12.5 MG tablet Take 1 tablet (12.5 mg total) by mouth 2 (two) times daily with a meal. 02/25/17   Crecencio Mc, MD  clopidogrel (PLAVIX) 75 MG tablet TAKE 1 TABLET(75 MG) BY MOUTH DAILY 02/25/17   Crecencio Mc, MD  ezetimibe (ZETIA) 10 MG tablet Take 1 tablet (10 mg total) by mouth daily. 03/08/19   Minna Merritts, MD  nitroGLYCERIN (NITROSTAT) 0.4 MG SL tablet PLACE 1 TABLET UNDER THE TONGUE EVERY 5 MINUTES AS NEEDED FOR CHEST PAIN 12/01/16   Crecencio Mc, MD  ONE TOUCH LANCETS MISC 1 application by Does not apply route daily. Check sugar once daily. 03/06/17   Crecencio Mc, MD  pantoprazole (PROTONIX) 40 MG tablet Take 1 tablet (40 mg total) by mouth daily. 02/26/17   Crecencio Mc, MD  rosuvastatin ([place])  20 MG tablet Take 1 tablet (20 mg total) by mouth daily. 03/23/19   Kendell Bane, NP  tamsulosin (FLOMAX) 0.4 MG CAPS capsule Take 1 capsule (0.4 mg total) by mouth daily. 01/22/17   Crecencio Mc, MD  telmisartan (MICARDIS) 80 MG tablet Take 1 tablet (80 mg total) by mouth daily. 02/21/19   Kendell Bane, NP    Family History  Problem Relation Age of Onset  . Cancer Mother   . Hyperlipidemia Mother   . Hypertension Mother   . Cancer Father        stomach  . Hyperlipidemia Father   . Hypertension Father   . Cancer Brother        bone cancer     Social History   Tobacco Use  . Smoking status: Former Smoker    Packs/day: 4.00    Years: 25.00    Pack years: 100.00    Types: Cigarettes    Quit date: 03/24/1981    Years since quitting: 38.0    . Smokeless tobacco: Never Used  . Tobacco comment: quit 30 years asgo  Substance Use Topics  . Alcohol use: No  . Drug use: No    Allergies as of 04/05/2019 - Review Complete 03/08/2019  Allergen Reaction Noted  . Peanut-containing drug products Swelling 04/28/2016    Review of Systems:    All systems reviewed and negative except where noted in HPI.   Physical Exam:  BP 136/82  No LMP for male patient. Psych:  Alert and cooperative. Normal mood and affect. General:   Alert,  Well-developed, well-nourished, pleasant and cooperative in NAD Head:  Normocephalic and atraumatic. Eyes:  Sclera clear, no icterus.   Conjunctiva pink. Lungs:  Respirations even and unlabored.  Clear throughout to auscultation.   No wheezes, crackles, or rhonchi. No acute distress. Heart:  Regular rate and rhythm; no murmurs, clicks, rubs, or gallops. Neurologic:  Alert and oriented x3;  grossly normal neurologically. Psych:  Alert and cooperative. Normal mood and affect.  Imaging Studies: No results found.  Assessment and Plan:   Ricky Abbott is a 74 y.o. y/o male has been referred for hepatitis B and C.  He has hepatitis C virus antibody that is positive.  No other labs available.  Transaminases are normal.  He recollects he was treated over 20 years back for hepatitis B and C with interferon and ribavirin and was told that he was cured.  It is very likely that he has been cured based on his history.  The hepatitis C virus antibody would remain positive for the rest of his life and unlikely to return back to being negative and probably reflects prior infection.  To confirm that he does not have any active infection I will check his hepatitis C viral load as well as hepatitis B serology as well.  If either were to be positive I will discuss further treatment options.  If there are negative then he would not require any further follow-up.  Follow up based on lab results  Dr Jonathon Bellows  MD,MRCP(U.K)

## 2019-04-09 LAB — HEPATITIS B E ANTIGEN: Hep B E Ag: NEGATIVE

## 2019-04-09 LAB — HEPATITIS B E ANTIBODY: Hep B E Ab: NEGATIVE

## 2019-04-09 LAB — HEPATITIS C GENOTYPE

## 2019-04-09 LAB — HEPATITIS B SURFACE ANTIGEN: Hepatitis B Surface Ag: NEGATIVE

## 2019-04-09 LAB — HEPATITIS B CORE ANTIBODY, TOTAL: Hep B Core Total Ab: POSITIVE — AB

## 2019-04-09 LAB — HEPATITIS A ANTIBODY, TOTAL: hep A Total Ab: NEGATIVE

## 2019-04-09 LAB — HIV ANTIBODY (ROUTINE TESTING W REFLEX): HIV Screen 4th Generation wRfx: NONREACTIVE

## 2019-04-09 LAB — HEPATITIS B SURFACE ANTIBODY,QUALITATIVE: Hep B Surface Ab, Qual: REACTIVE

## 2019-04-28 ENCOUNTER — Encounter: Payer: Self-pay | Admitting: Gastroenterology

## 2019-05-02 ENCOUNTER — Telehealth: Payer: Self-pay | Admitting: Gastroenterology

## 2019-05-02 NOTE — Telephone Encounter (Signed)
Pt called upset very upset stating he was exspecting a call from [place] to let him know what we found he states he has not received a call and he did not want to be transferred to a nurse and that he was done with [place].

## 2019-05-03 NOTE — Telephone Encounter (Signed)
-----   Message from Jonathon Bellows, MD sent at 04/28/2019  4:05 PM EDT ----- Can we find out why the hepatitis C viral load was not performed also ordered.  If not done can we have it done please the patient should not be charged for this as it was an error by the lab

## 2019-05-03 NOTE — Telephone Encounter (Signed)
Spoke with pt and informed him of lab results and that the Hep C viral load was not collected by the lab, I explained to pt that he would need to visit the lab for the remaining lab test. Pt refused and stated he's "not scheduling any more appointments".

## 2019-05-03 NOTE — Telephone Encounter (Signed)
Ricky Bane, NP [name]  [name]

## 2019-05-09 ENCOUNTER — Other Ambulatory Visit: Payer: Self-pay

## 2019-05-09 DIAGNOSIS — N138 Other obstructive and reflux uropathy: Secondary | ICD-10-CM

## 2019-05-09 MED ORDER — PANTOPRAZOLE SODIUM 40 MG PO TBEC: 40.0000 mg | DELAYED_RELEASE_TABLET | Freq: Every day | ORAL | 1 refills | Status: DC

## 2019-05-09 MED ORDER — TAMSULOSIN HCL 0.4 MG PO CAPS: 0.4000 mg | ORAL_CAPSULE | Freq: Every day | ORAL | 1 refills | Status: DC

## 2019-05-09 MED ORDER — CARVEDILOL 12.5 MG PO TABS: 12.5000 mg | ORAL_TABLET | Freq: Two times a day (BID) | ORAL | 1 refills | Status: DC

## 2019-05-09 MED ORDER — CLOPIDOGREL BISULFATE 75 MG PO TABS: ORAL_TABLET | ORAL | 1 refills | Status: DC

## 2019-05-11 ENCOUNTER — Telehealth: Payer: Self-pay

## 2019-05-11 NOTE — Telephone Encounter (Signed)
Confirmed appointment on 05/16/2019 and screened for covid. klh 

## 2019-05-16 ENCOUNTER — Ambulatory Visit (INDEPENDENT_AMBULATORY_CARE_PROVIDER_SITE_OTHER): Payer: Medicare Other | Admitting: Adult Health

## 2019-05-16 ENCOUNTER — Encounter: Payer: Self-pay | Admitting: Adult Health

## 2019-05-16 ENCOUNTER — Other Ambulatory Visit: Payer: Self-pay

## 2019-05-16 VITALS — BP 147/79 | HR 67 | Temp 97.5°F | Resp 16 | Ht 71.0 in | Wt 205.6 lb

## 2019-05-16 DIAGNOSIS — E1165 Type 2 diabetes mellitus with hyperglycemia: Secondary | ICD-10-CM

## 2019-05-16 DIAGNOSIS — E782 Mixed hyperlipidemia: Secondary | ICD-10-CM | POA: Diagnosis not present

## 2019-05-16 DIAGNOSIS — K219 Gastro-esophageal reflux disease without esophagitis: Secondary | ICD-10-CM

## 2019-05-16 DIAGNOSIS — I10 Essential (primary) hypertension: Secondary | ICD-10-CM

## 2019-05-16 DIAGNOSIS — F339 Major depressive disorder, recurrent, unspecified: Secondary | ICD-10-CM

## 2019-05-16 LAB — POCT GLYCOSYLATED HEMOGLOBIN (HGB A1C): Hemoglobin A1C: 6.5 % — AB (ref 4.0–5.6)

## 2019-05-16 NOTE — Progress Notes (Signed)
[facility] [place], [place] [address]  Internal MEDICINE  Office Visit Note  Patient Name: Ricky Abbott  [id number]  [id number]  Date of Service: 05/16/2019  Chief Complaint  Patient presents with  . Follow-up  . Depression  . Diabetes  . Gastroesophageal Reflux  . Hyperlipidemia  . Hypertension    HPI  Pt is here for follow up on depression, dm, Gerd, HTn and HLD.  Overall he is doing well. His blood pressure is mostly stable.  His pressure today is 147/79.  Denies Chest pain, Shortness of breath, palpitations, headache, or blurred vision.  He is taking Crestor and Zetia for HLD.  His GERD is controlled mostly by Protonix.  Somedays he has break through symptoms.      Current Medication: Outpatient Encounter Medications as of 05/16/2019  Medication Sig  . aspirin 81 MG tablet Take 81 mg by mouth daily. Reported on 02/19/2015  . carvedilol (COREG) 12.5 MG tablet Take 1 tablet (12.5 mg total) by mouth 2 (two) times daily with a meal.  . clopidogrel (PLAVIX) 75 MG tablet TAKE 1 TABLET(75 MG) BY MOUTH DAILY  . ezetimibe (ZETIA) 10 MG tablet Take 1 tablet (10 mg total) by mouth daily.  . nitroGLYCERIN (NITROSTAT) 0.4 MG SL tablet PLACE 1 TABLET UNDER THE TONGUE EVERY 5 MINUTES AS NEEDED FOR CHEST PAIN  . ONE TOUCH LANCETS MISC 1 application by Does not apply route daily. Check sugar once daily.  . pantoprazole (PROTONIX) 40 MG tablet Take 1 tablet (40 mg total) by mouth daily.  . rosuvastatin (CRESTOR) 20 MG tablet Take 1 tablet (20 mg total) by mouth daily.  . tamsulosin (FLOMAX) 0.4 MG CAPS capsule Take 1 capsule (0.4 mg total) by mouth daily.  Marland Kitchen telmisartan (MICARDIS) 80 MG tablet Take 1 tablet (80 mg total) by mouth daily.   No facility-administered encounter medications on file as of 05/16/2019.    Surgical History: Past Surgical History:  Procedure Laterality Date  . billary tube placement    . [place],  [place]  . CARDIAC CATHETERIZATION     [facility]   . CHOLECYSTECTOMY N/A 06/05/2016   Procedure: LAPAROSCOPIC CHOLECYSTECTOMY with cholangiogram;  Surgeon: Jules Husbands, MD;  Location: [facility] ORS;  Service: General;  Laterality: N/A;  . [place], 05/2012   x 2  . IR GENERIC HISTORICAL  04/30/2016   IR PERC CHOLECYSTOSTOMY 04/30/2016 [facility]-INTERV RAD    Medical History: Past Medical History:  Diagnosis Date  . Allergy   . Anxiety   . Asthma   . CAD, multiple vessel   . COPD (chronic obstructive pulmonary disease) ([place])   . Depression   . Diabetes mellitus without complication ([place])   . Dyspnea   . GERD (gastroesophageal reflux disease)   . History of chicken pox   . Hyperlipidemia   . Hypertension   . MI (myocardial infarction) ([place])   . Prostate enlargement   . PTSD (post-traumatic stress disorder)    [place] Vet  . Sarcoidosis of lung ([place])     Family History: Family History  Problem Relation Age of Onset  . Cancer Mother   . Hyperlipidemia Mother   . Hypertension Mother   . Cancer Father        stomach  . Hyperlipidemia Father   . Hypertension Father   . Cancer Brother        bone cancer    Social History  Socioeconomic History  . Marital status: Single    Spouse name: Not on file  . Number of children: Not on file  . Years of education: Not on file  . Highest education level: Not on file  Occupational History  . Not on file  Tobacco Use  . Smoking status: Former Smoker    Packs/day: 4.00    Years: 25.00    Pack years: 100.00    Types: Cigarettes    Quit date: 03/24/1981    Years since quitting: 38.1  . Smokeless tobacco: Never Used  . Tobacco comment: quit 30 years asgo  Substance and Sexual Activity  . Alcohol use: No  . Drug use: No  . Sexual activity: Never  Other Topics Concern  . Not on file  Social History Narrative   Ricky Abbott grew up in [place], [place]. He moved to the [place] area in 2013. [place] Veteran. Retired Dietitian.   Social Determinants of Health   Financial Resource Strain:   . Difficulty of Paying Living Expenses:   Food Insecurity:   . Worried About Charity fundraiser in the Last Year:   . Arboriculturist in the Last Year:   Transportation Needs:   . Film/video editor (Medical):   Marland Kitchen Lack of Transportation (Non-Medical):   Physical Activity:   . Days of Exercise per Week:   . Minutes of Exercise per Session:   Stress:   . Feeling of Stress :   Social Connections:   . Frequency of Communication with Friends and Family:   . Frequency of Social Gatherings with Friends and Family:   . Attends Religious Services:   . Active Member of Clubs or Organizations:   . Attends Archivist Meetings:   Marland Kitchen Marital Status:   Intimate Partner Violence:   . Fear of Current or Ex-Partner:   . Emotionally Abused:   Marland Kitchen Physically Abused:   . Sexually Abused:       Review of Systems  Constitutional: Negative.  Negative for chills, fatigue and unexpected weight change.  HENT: Negative.  Negative for congestion, rhinorrhea, sneezing and sore throat.   Eyes: Negative for redness.  Respiratory: Negative.  Negative for cough, chest tightness and shortness of breath.   Cardiovascular: Negative.  Negative for chest pain and palpitations.  Gastrointestinal: Negative.  Negative for abdominal pain, constipation, diarrhea, nausea and vomiting.  Endocrine: Negative.   Genitourinary: Negative.  Negative for dysuria and frequency.  Musculoskeletal: Negative.  Negative for arthralgias, back pain, joint swelling and neck pain.  Skin: Negative.  Negative for rash.  Allergic/Immunologic: Negative.   Neurological: Negative.  Negative for tremors and numbness.  Hematological: Negative for adenopathy. Does not bruise/bleed easily.  Psychiatric/Behavioral: Negative.  Negative for behavioral problems, sleep disturbance and suicidal ideas. The patient is not nervous/anxious.     Vital Signs: BP (!)  147/79   Pulse 67   Temp (!) 97.5 F (36.4 C)   Resp 16   Ht 5\' 11"  (1.803 m)   Wt 205 lb 9.6 oz (93.3 kg)   SpO2 95%   BMI 28.68 kg/m    Physical Exam Vitals and nursing note reviewed.  Constitutional:      General: He is not in acute distress.    Appearance: He is well-developed. He is not diaphoretic.  HENT:     Head: Normocephalic and atraumatic.     Mouth/Throat:     Pharynx: No oropharyngeal exudate.  Eyes:  Pupils: Pupils are equal, round, and reactive to light.  Neck:     Thyroid: No thyromegaly.     Vascular: No JVD.     Trachea: No tracheal deviation.  Cardiovascular:     Rate and Rhythm: Normal rate and regular rhythm.     Heart sounds: Normal heart sounds. No murmur. No friction rub. No gallop.   Pulmonary:     Effort: Pulmonary effort is normal. No respiratory distress.     Breath sounds: Normal breath sounds. No wheezing or rales.  Chest:     Chest wall: No tenderness.  Abdominal:     Palpations: Abdomen is soft.     Tenderness: There is no abdominal tenderness. There is no guarding.  Musculoskeletal:        General: Normal range of motion.     Cervical back: Normal range of motion and neck supple.  Lymphadenopathy:     Cervical: No cervical adenopathy.  Skin:    General: Skin is warm and dry.  Neurological:     Mental Status: He is alert and oriented to person, place, and time.     Cranial Nerves: No cranial nerve deficit.  Psychiatric:        Behavior: Behavior normal.        Thought Content: Thought content normal.        Judgment: Judgment normal.    Assessment/Plan: 1. Uncontrolled type 2 diabetes mellitus with hyperglycemia (HCC) A1C is 6.5, which is stable.  - POCT HgB A1C  2. Essential hypertension Stable, continue present management.  3. Mixed hyperlipidemia Stable, continue meds a prescribed.   4. Gastroesophageal reflux disease without esophagitis Continue Protonix.   5. Depression, recurrent ([place]) Has symptoms at times,  does not currently take medication.    General Counseling: durel timian understanding of the findings of todays visit and agrees with plan of treatment. I have discussed any further diagnostic evaluation that may be needed or ordered today. We also reviewed his medications today. he has been encouraged to call the office with any questions or concerns that should arise related to todays visit.    Orders Placed This Encounter  Procedures  . POCT HgB A1C    No orders of the defined types were placed in this encounter.   Time spent: 30 Minutes   This patient was seen by Orson Gear AGNP-C in Collaboration with Dr Lavera Guise as a part of collaborative care agreement     Kendell Bane AGNP-C Internal medicine

## 2019-07-11 ENCOUNTER — Other Ambulatory Visit: Payer: Self-pay | Admitting: Adult Health

## 2019-07-15 ENCOUNTER — Other Ambulatory Visit: Payer: Self-pay | Admitting: Adult Health

## 2019-08-26 ENCOUNTER — Telehealth: Payer: Self-pay | Admitting: Cardiovascular Disease

## 2019-08-26 ENCOUNTER — Other Ambulatory Visit: Payer: Self-pay

## 2019-08-26 ENCOUNTER — Telehealth (INDEPENDENT_AMBULATORY_CARE_PROVIDER_SITE_OTHER): Payer: Self-pay | Admitting: Gastroenterology

## 2019-08-26 DIAGNOSIS — Z1211 Encounter for screening for malignant neoplasm of colon: Secondary | ICD-10-CM

## 2019-08-26 NOTE — Telephone Encounter (Signed)
   [facility] Pre-operative Risk Assessment    HEARTCARE STAFF: - Please ensure there is not already an duplicate clearance open for this procedure. - Under Visit Info/Reason for Call, type in Other and utilize the format Clearance MM/DD/YY or Clearance TBD. Do not use dashes or single digits. - If request is for dental extraction, please clarify the # of teeth to be extracted.  Request for surgical clearance:  1. What type of surgery is being performed? Colonoscopy   2. When is this surgery scheduled? 09/27/19  3. What type of clearance is required (medical clearance vs. Pharmacy clearance to hold med vs. Both)? both  4. Are there any medications that need to be held prior to surgery and how long? Plavix instructions - when to stop and restart   5. Practice name and name of physician performing surgery? [facility] - Dr Jonathon Bellows   6. What is the office phone number? [phone]   7.   What is the office fax number? [phone]  8.   Anesthesia type (None, local, MAC, general) ? Not listed    Ricky Abbott 08/26/2019, 3:16 PM  _________________________________________________________________   (provider comments below)

## 2019-08-26 NOTE — Progress Notes (Signed)
Gastroenterology Pre-Procedure Review  Request Date: 09/27/19 Requesting Physician: Dr. Vicente Males  PATIENT REVIEW QUESTIONS: The patient responded to the following health history questions as indicated:    1. Are you having any GI issues? yes (constipation, hernia) 2. Do you have a personal history of Polyps? no 3. Do you have a family history of Colon Cancer or Polyps? no 4. Diabetes Mellitus? no 5. Joint replacements in the past 12 months?no 6. Major health problems in the past 3 months?no 7. Any artificial heart valves, MVP, or defibrillator?no    MEDICATIONS & ALLERGIES:    Patient reports the following regarding taking any anticoagulation/antiplatelet therapy:   Plavix, Coumadin, Eliquis, Xarelto, Lovenox, Pradaxa, Brilinta, or Effient? yes (Plavix blood thinner to be sent) Aspirin? yes (81 mg)  Patient confirms/reports the following medications:  Current Outpatient Medications  Medication Sig Dispense Refill  . aspirin 81 MG tablet Take 81 mg by mouth daily. Reported on 02/19/2015    . carvedilol (COREG) 12.5 MG tablet Take 1 tablet (12.5 mg total) by mouth 2 (two) times daily with a meal. 180 tablet 1  . clopidogrel (PLAVIX) 75 MG tablet TAKE 1 TABLET(75 MG) BY MOUTH DAILY 90 tablet 1  . ezetimibe (ZETIA) 10 MG tablet Take 1 tablet (10 mg total) by mouth daily. 90 tablet 3  . nitroGLYCERIN (NITROSTAT) 0.4 MG SL tablet PLACE 1 TABLET UNDER THE TONGUE EVERY 5 MINUTES AS NEEDED FOR CHEST PAIN 75 tablet 1  . ONE TOUCH LANCETS MISC 1 application by Does not apply route daily. Check sugar once daily. 100 each 1  . pantoprazole (PROTONIX) 40 MG tablet Take 1 tablet (40 mg total) by mouth daily. 90 tablet 1  . rosuvastatin (CRESTOR) 20 MG tablet TAKE 1 TABLET BY MOUTH  DAILY 90 tablet 1  . tamsulosin (FLOMAX) 0.4 MG CAPS capsule Take 1 capsule (0.4 mg total) by mouth daily. 90 capsule 1  . telmisartan (MICARDIS) 80 MG tablet TAKE 1 TABLET BY MOUTH  DAILY 90 tablet 1   No current  facility-administered medications for this visit.    Patient confirms/reports the following allergies:  Allergies  Allergen Reactions  . Peanut-Containing Drug Products Swelling    Swelling, rash, (Peanuts)    Orders Placed This Encounter  Procedures  . Procedural/ Surgical Case Request: COLONOSCOPY WITH PROPOFOL    Standing Status:   Standing    Number of Occurrences:   1    Order Specific Question:   Pre-op diagnosis    Answer:   screening colonoscopy    Order Specific Question:   CPT Code    Answer:   91505    AUTHORIZATION INFORMATION Primary Insurance: 1D#: Group #:  Secondary Insurance: 1D#: Group #:  SCHEDULE INFORMATION: Date: 09/27/19 Time: Location:[facility]

## 2019-08-26 NOTE — Telephone Encounter (Signed)
   Primary Cardiologist: Ida Rogue, MD  Chart reviewed as part of pre-operative protocol coverage. Patient was contacted 08/26/2019 in reference to pre-operative risk assessment for pending surgery as outlined below.  Ricky Abbott was last seen on Dr. Rockey Situ 02/2019. H/o CAD s/p stent 20 yrs ago in [place], [place] 2014 s/p stent at [facility], PTSD, ETOH, depression, DM, hepatitis, insurance, financial issues, HTN, HLD, COPD, sarcoidosis of lung amongst other issues as outlined. Echo 2018 EF 55-60%. Dr. Donivan Scull note outlines stress test November 2015 showing no ischemia, there was old scar in the mid to apical inferior wall.  Has been maintained on DAPT - will route to Dr. Rockey Situ for information on holding Plavix for colonoscopy as below then patient will need call.  Charlie Pitter, PA-C 08/26/2019, 4:32 PM

## 2019-08-28 NOTE — Telephone Encounter (Signed)
Acceptable risk for procedure, hold Plavix, Stay on low-dose aspirin

## 2019-08-31 ENCOUNTER — Telehealth: Payer: Self-pay | Admitting: General Practice

## 2019-08-31 ENCOUNTER — Ambulatory Visit: Payer: Self-pay | Admitting: Surgery

## 2019-08-31 NOTE — Telephone Encounter (Signed)
Primary Cardiologist:Timothy Rockey Situ, MD  Chart reviewed as part of pre-operative protocol coverage. Because of Ricky Abbott's past medical history and time since last visit, he/she will require a follow-up visit in order to better assess preoperative cardiovascular risk.  Pre-op covering staff: - Please schedule appointment and call patient to inform them. - Please contact requesting surgeon's office via preferred method (i.e, phone, fax) to inform them of need for appointment prior to surgery.  If applicable, this message will also be routed to pharmacy pool and/or primary cardiologist for input on holding anticoagulant/antiplatelet agent as requested below so that this information is available at time of patient's appointment.   Deberah Pelton, NP  08/31/2019, 2:31 PM

## 2019-08-31 NOTE — Telephone Encounter (Signed)
Called pt back and scheduled appt for 09/09/2019 @  11:30 AM w/Jacquelyn Mickle Plumb, PA-C. Pt will arrive early wearing a mask  See clearance dated 08-26-2019   Forwarded to requesting party via EPIC

## 2019-08-31 NOTE — Telephone Encounter (Signed)
Ricky Abbott is returning phone call.

## 2019-09-02 ENCOUNTER — Telehealth: Payer: Self-pay | Admitting: Cardiovascular Disease

## 2019-09-02 ENCOUNTER — Ambulatory Visit: Payer: Medicare Other | Admitting: Surgery

## 2019-09-02 ENCOUNTER — Other Ambulatory Visit: Payer: Self-pay

## 2019-09-02 ENCOUNTER — Telehealth: Payer: Self-pay

## 2019-09-02 ENCOUNTER — Encounter: Payer: Self-pay | Admitting: Surgery

## 2019-09-02 VITALS — BP 132/77 | HR 89 | Temp 98.7°F | Resp 12 | Ht 71.0 in | Wt 200.0 lb

## 2019-09-02 DIAGNOSIS — K432 Incisional hernia without obstruction or gangrene: Secondary | ICD-10-CM | POA: Diagnosis not present

## 2019-09-02 DIAGNOSIS — M6208 Separation of muscle (nontraumatic), other site: Secondary | ICD-10-CM

## 2019-09-02 NOTE — Patient Instructions (Addendum)
Faxed Cardiac Clearance to [place] for the Plavix you are currently taking.   Our surgery scheduler will call you within 24-48 hours to schedule your surgery,. Please have the [place] surgery sheet available when speaking with her.   You may try taking Colace or Metamucil for your constipation. Be sure to increase water intake daily.   Constipation, Adult Constipation is when a person:  Poops (has a bowel movement) fewer times in a week than normal.  Has a hard time pooping.  Has poop that is dry, hard, or bigger than normal. Follow these instructions at home: Eating and drinking   Eat foods that have a lot of fiber, such as: ? Fresh fruits and vegetables. ? Whole grains. ? Beans.  Eat less of foods that are high in fat, low in fiber, or overly processed, such as: ? [place] fries. ? Hamburgers. ? Cookies. ? Candy. ? Soda.  Drink enough fluid to keep your pee (urine) clear or pale yellow. General instructions  Exercise regularly or as told by your doctor.  Go to the restroom when you feel like you need to poop. Do not hold it in.  Take over-the-counter and prescription medicines only as told by your doctor. These include any fiber supplements.  Do pelvic floor retraining exercises, such as: ? Doing deep breathing while relaxing your lower belly (abdomen). ? Relaxing your pelvic floor while pooping.  Watch your condition for any changes.  Keep all follow-up visits as told by your doctor. This is important. Contact a doctor if:  You have pain that gets worse.  You have a fever.  You have not pooped for 4 days.  You throw up (vomit).  You are not hungry.  You lose weight.  You are bleeding from the anus.  You have thin, pencil-like poop (stool). Get help right away if:  You have a fever, and your symptoms suddenly get worse.  You leak poop or have blood in your poop.  Your belly feels hard or bigger than normal (is bloated).  You have very bad belly  pain.  You feel dizzy or you faint. This information is not intended to replace advice given to you by your health care provider. Make sure you discuss any questions you have with your health care provider. Document Revised: 01/09/2017 Document Reviewed: 07/18/2015 Elsevier Patient Education  [place]. [employer] Content in Foods  See the following list for the dietary fiber content of some common foods. High-fiber foods High-fiber foods contain 4 grams or more (4g or more) of fiber per serving. They include:  Artichoke (fresh) -- 1 medium has 10.3g of fiber.  Baked beans, plain or vegetarian (canned) --  cup has 5.2g of fiber.  Blackberries or raspberries (fresh) --  cup has 4g of fiber.  Bran cereal --  cup has 8.6g of fiber.  Bulgur (cooked) --  cup has 4g of fiber.  Kidney beans (canned) --  cup has 6.8g of fiber.  Lentils (cooked) --  cup has 7.8g of fiber.  Pear (fresh) -- 1 medium has 5.1g of fiber.  Peas (frozen) --  cup has 4.4g of fiber.  Pinto beans (canned) --  cup has 5.5g of fiber.  Pinto beans (dried and cooked) --  cup has 7.7g of fiber.  Potato with skin (baked) -- 1 medium has 4.4g of fiber.  Quinoa (cooked) --  cup has 5g of fiber.  Soybeans (canned, frozen, or fresh) --  cup has 5.1g of fiber. Moderate-fiber foods Moderate-fiber  foods contain 1-4 grams (1-4g) of fiber per serving. They include:  Almonds -- 1 oz. has 3.5g of fiber.  Apple with skin -- 1 medium has 3.3g of fiber.  Applesauce, sweetened --  cup has 1.5g of fiber.  Bagel, plain -- one 4-inch (10-cm) bagel has 2g of fiber.  Banana -- 1 medium has 3.1g of fiber.  Broccoli (cooked) --  cup has 2.5g of fiber.  Carrots (cooked) --  cup has 2.3g of fiber.  Corn (canned or frozen) --  cup has 2.1g of fiber.  Corn tortilla -- one 6-inch (15-cm) tortilla has 1.5g of fiber.  Green beans (canned) --  cup has 2g of fiber.  Instant oatmeal --  cup has about 2g of  fiber.  Long-grain brown rice (cooked) -- 1 cup has 3.5g of fiber.  Macaroni, enriched (cooked) -- 1 cup has 2.5g of fiber.  Melon -- 1 cup has 1.4g of fiber.  Multigrain cereal --  cup has about 2-4g of fiber.  Orange -- 1 small has 3.1g of fiber.  Potatoes, mashed --  cup has 1.6g of fiber.  Raisins -- 1/4 cup has 1.6g of fiber.  Squash --  cup has 2.9g of fiber.  Sunflower seeds --  cup has 1.1g of fiber.  Tomato -- 1 medium has 1.5g of fiber.  Vegetable or soy patty -- 1 has 3.4g of fiber.  Whole-wheat bread -- 1 slice has 2g of fiber.  Whole-wheat spaghetti --  cup has 3.2g of fiber. Low-fiber foods Low-fiber foods contain less than 1 gram (less than 1g) of fiber per serving. They include:  Egg -- 1 large.  Flour tortilla -- one 6-inch (15-cm) tortilla.  Fruit juice --  cup.  Lettuce -- 1 cup.  Meat, poultry, or fish -- 1 oz.  Milk -- 1 cup.  Spinach (raw) -- 1 cup.  White bread -- 1 slice.  White rice --  cup.  Yogurt --  cup. Actual amounts of fiber in foods may be different depending on processing. Talk with your dietitian about how much fiber you need in your diet. This information is not intended to replace advice given to you by your health care provider. Make sure you discuss any questions you have with your health care provider. Document Revised: 09/20/2015 Document Reviewed: 03/22/2015 Elsevier Patient Education  2020 [place], Adult     A hernia happens when tissue inside your body pushes out through a weak spot in your belly muscles (abdominal wall). This makes a round lump (bulge). The lump may be:  In a scar from surgery that was done in your belly (incisional hernia).  Near your belly button (umbilical hernia).  In your groin (inguinal hernia). Your groin is the area where your leg meets your lower belly (abdomen). This kind of hernia could also be: ? In your scrotum, if you are male. ? In folds of skin around  your vagina, if you are male.  In your upper thigh (femoral hernia).  Inside your belly (hiatal hernia). This happens when your stomach slides above the muscle between your belly and your chest (diaphragm). If your hernia is small and it does not cause pain, you may not need treatment. If your hernia is large or it causes pain, you may need surgery. Follow these instructions at home: Activity  Avoid stretching or overusing (straining) the muscles near your hernia. Straining can happen when you: ? Lift something heavy. ? Poop (have a bowel movement).  Do not lift anything that is heavier than 10 lb (4.5 kg), or the limit that you are told, until your doctor says that it is safe.  Use the strength of your legs when you lift something heavy. Do not use only your back muscles to lift. General instructions  Do these things if told by your doctor so you do not have trouble pooping (constipation): ? Drink enough fluid to keep your pee (urine) pale yellow. ? Eat foods that are high in fiber. These include fresh fruits and vegetables, whole grains, and beans. ? Limit foods that are high in fat and processed sugars. These include foods that are fried or sweet. ? Take medicine for trouble pooping.  When you cough, try to cough gently.  You may try to push your hernia in by very gently pressing on it when you are lying down. Do not try to force the bulge back in if it will not push in easily.  If you are overweight, work with your doctor to lose weight safely.  Do not use any products that have nicotine or tobacco in them. These include cigarettes and e-cigarettes. If you need help quitting, ask your doctor.  If you will be having surgery (hernia repair), watch your hernia for changes in shape, size, or color. Tell your doctor if you see any changes.  Take over-the-counter and prescription medicines only as told by your doctor.  Keep all follow-up visits as told by your doctor. Contact a  doctor if:  You get new pain, swelling, or redness near your hernia.  You poop fewer times in a week than normal.  You have trouble pooping.  You have poop (stool) that is more dry than normal.  You have poop that is harder or larger than normal. Get help right away if:  You have a fever.  You have belly pain that gets worse.  You feel sick to your stomach (nauseous).  You throw up (vomit).  Your hernia cannot be pushed in by very gently pressing on it when you are lying down. Do not try to force the bulge back in if it will not push in easily.  Your hernia: ? Changes in shape or size. ? Changes color. ? Feels hard or it hurts when you touch it. These symptoms may represent a serious problem that is an emergency. Do not wait to see if the symptoms will go away. Get medical help right away. Call your local emergency services (911 in the [place] Summary  A hernia happens when tissue inside your body pushes out through a weak spot in the belly muscles. This creates a bulge.  If your hernia is small and it does not hurt, you may not need treatment. If your hernia is large or it hurts, you may need surgery.  If you will be having surgery, watch your hernia for changes in shape, size, or color. Tell your doctor about any changes. This information is not intended to replace advice given to you by your health care provider. Make sure you discuss any questions you have with your health care provider. Document Revised: 05/20/2018 Document Reviewed: 10/29/2016 Elsevier Patient Education  [place].

## 2019-09-02 NOTE — Telephone Encounter (Signed)
   [facility] Pre-operative Risk Assessment    HEARTCARE STAFF: - Please ensure there is not already an duplicate clearance open for this procedure. - Under Visit Info/Reason for Call, type in Other and utilize the format Clearance MM/DD/YY or Clearance TBD. Do not use dashes or single digits. - If request is for dental extraction, please clarify the # of teeth to be extracted.  Request for surgical clearance:  1. What type of surgery is being performed? Not noted - attempted to get more information but office hours for Friday are 8:30a to 1p  2. When is this surgery scheduled? TBD   3. What type of clearance is required (medical clearance vs. Pharmacy clearance to hold med vs. Both)? both  4. Are there any medications that need to be held prior to surgery and how long? Not listed, please advise   5. Practice name and name of physician performing surgery? [facility] - Dr Jacqulyn Bath Piscoya  6. What is the office phone number? [phone]   7.   What is the office fax number? [phone]  8.   Anesthesia type (None, local, MAC, general) ? Not listed    Ace Gins 09/02/2019, 3:26 PM  _________________________________________________________________   (provider comments below)

## 2019-09-02 NOTE — Telephone Encounter (Signed)
Cardiac Clearance faxed to [place] for Plavix and Aspirin.

## 2019-09-02 NOTE — Progress Notes (Signed)
09/02/2019  Reason for Visit:  Incisional hernia  Referring Provider:  Orson Gear, NP  History of Present Illness: Ricky Abbott is a 74 y.o. male presenting for evaluation of an incisional hernia.  The patient is s/p laparoscopic cholecystectomy with Dr. Dahlia Byes in 05/2016.  The patient reports that right after surgery he had pain in his umbilical incision and felt there was bulging.  This has only worsened with time and he reports that there is persistent pain associated with it.  He reports issues with his bowels and constipation, and discomfort when he strains.  Denies any nausea, vomiting, fevers, chills, issues with the skin overlying the hernia defect.  Of note, he has a history of MI many years ago and is on both Aspirin and Plavix.  Dr. Rockey Situ is his cardiologist.  He is currently scheduled for colonoscopy with Dr. Vicente Males on 09/27/19.  Past Medical History: Past Medical History:  Diagnosis Date  . Allergy   . Anxiety   . Asthma   . CAD, multiple vessel   . COPD (chronic obstructive pulmonary disease) ([place])   . Depression   . Diabetes mellitus without complication ([place])   . Dyspnea   . GERD (gastroesophageal reflux disease)   . History of chicken pox   . Hyperlipidemia   . Hypertension   . MI (myocardial infarction) ([place])   . Prostate enlargement   . PTSD (post-traumatic stress disorder)    [place] [facility]  . Sarcoidosis of lung [facility])      Past Surgical History: Past Surgical History:  Procedure Laterality Date  . billary tube placement    . [place], [place]  . CARDIAC CATHETERIZATION     [facility]   . CHOLECYSTECTOMY N/A 06/05/2016   Procedure: LAPAROSCOPIC CHOLECYSTECTOMY with cholangiogram;  Surgeon: Jules Husbands, MD;  Location: [facility] ORS;  Service: General;  Laterality: N/A;  . [place], 05/2012   x 2  . IR GENERIC HISTORICAL  04/30/2016   IR PERC CHOLECYSTOSTOMY 04/30/2016 [facility]-INTERV RAD    Home  Medications: Prior to Admission medications   Medication Sig Start Date End Date Taking? Authorizing Provider  aspirin 81 MG tablet Take 81 mg by mouth daily. Reported on 02/19/2015 09/02/13  Yes Minna Merritts, MD  carvedilol (COREG) 12.5 MG tablet Take 1 tablet (12.5 mg total) by mouth 2 (two) times daily with a meal. 05/09/19  Yes Scarboro, Audie Clear, NP  clopidogrel (PLAVIX) 75 MG tablet TAKE 1 TABLET(75 MG) BY MOUTH DAILY 05/09/19  Yes Scarboro, Audie Clear, NP  ezetimibe (ZETIA) 10 MG tablet Take 1 tablet (10 mg total) by mouth daily. 03/08/19  Yes Gollan, Kathlene November, MD  nitroGLYCERIN (NITROSTAT) 0.4 MG SL tablet PLACE 1 TABLET UNDER THE TONGUE EVERY 5 MINUTES AS NEEDED FOR CHEST PAIN 12/01/16  Yes Crecencio Mc, MD  ONE TOUCH LANCETS MISC 1 application by Does not apply route daily. Check sugar once daily. 03/06/17  Yes Crecencio Mc, MD  pantoprazole (PROTONIX) 40 MG tablet Take 1 tablet (40 mg total) by mouth daily. 05/09/19  Yes Scarboro, Audie Clear, NP  rosuvastatin (CRESTOR) 20 MG tablet TAKE 1 TABLET BY MOUTH  DAILY 07/18/19  Yes Scarboro, Audie Clear, NP  tamsulosin (FLOMAX) 0.4 MG CAPS capsule Take 1 capsule (0.4 mg total) by mouth daily. 05/09/19  Yes Scarboro, Audie Clear, NP  telmisartan (MICARDIS) 80 MG tablet TAKE 1 TABLET BY MOUTH  DAILY 07/12/19  Yes Scarboro,  Audie Clear, NP    Allergies: Allergies  Allergen Reactions  . Peanut-Containing Drug Products Swelling    Swelling, rash, (Peanuts)    Social History:  reports that he quit smoking about 38 years ago. His smoking use included cigarettes. He has a 100.00 pack-year smoking history. He has never used smokeless tobacco. He reports that he does not drink alcohol and does not use drugs.   Family History: Family History  Problem Relation Age of Onset  . Cancer Mother   . Hyperlipidemia Mother   . Hypertension Mother   . Cancer Father        stomach  . Hyperlipidemia Father   . Hypertension Father   . Cancer Brother        bone cancer     Review of Systems: Review of Systems  Constitutional: Negative for chills and fever.  HENT: Negative for hearing loss.   Respiratory: Negative for shortness of breath.   Cardiovascular: Negative for chest pain.  Gastrointestinal: Positive for abdominal pain and constipation. Negative for diarrhea, nausea and vomiting.  Genitourinary: Negative for dysuria.  Musculoskeletal: Negative for myalgias.  Skin: Negative for rash.  Neurological: Negative for dizziness.  Psychiatric/Behavioral: Negative for depression.    Physical Exam BP (!) 132/77   Pulse 89   Temp 98.7 F (37.1 C) (Oral)   Resp 12   Ht 5\' 11"  (1.803 m)   Wt 200 lb (90.7 kg)   SpO2 96%   BMI 27.89 kg/m  CONSTITUTIONAL: No acute distress HEENT:  Normocephalic, atraumatic, extraocular motion intact. NECK: Trachea is midline, and there is no jugular venous distension.  RESPIRATORY:  Lungs are clear, and breath sounds are equal bilaterally. Normal respiratory effort without pathologic use of accessory muscles. CARDIOVASCULAR: Heart is regular without murmurs, gallops, or rubs. GI: The abdomen is soft, obese, non-distended, with tenderness to palpation at the umbilicus.  He has an incisional hernia at the port site, measuring about 3 cm.  He also has diastasis recti of about 7 cm at its maximum width extending from subxiphoid area to umbilicus. MUSCULOSKELETAL:  Normal muscle strength and tone in all four extremities.  No peripheral edema or cyanosis. SKIN: Skin turgor is normal. There are no pathologic skin lesions.  NEUROLOGIC:  Motor and sensation is grossly normal.  Cranial nerves are grossly intact. PSYCH:  Alert and oriented to person, place and time. Affect is normal.  Laboratory Analysis: None recently  Imaging: None recently  Assessment and Plan: This is a 74 y.o. male presenting with an incisional hernia at the umbilical port site from prior laparoscopic cholecystectomy, as well as diastasis  recti.  Discussed with the patient that we have two options for his surgery.  We can repair the umbilical hernia alone and with that we would close the defect with sutures and place a mesh as well for reinforcement.  The other option would be to repair the umbilical hernia, but also do a plication of the diastasis recti.  The benefit of the latter, although a more aggressive surgery, would be that the hernia repair would also be better reinforced with the plication, rather than allowing mesh and sutures to go on a weaker linea alba.  He would rather have everything done, and he has opted to proceed with robotic assisted umbilical hernia repair and plication of diastasis recti.  We would use mesh to reinforce the entire repair line.  This would warrant an overnight admission for observation, and he would have to wear an abdominal  binder afterwards.  He is in agreement with this.    He is currently scheduled for his colonoscopy on 8/17 with Dr. Vicente Males.  I have discussed with him, and he's ok with doing a combined case.  We'll have our offices coordinate so we can schedule him at the appropriate time.  We'll also send cardiology clearance form and also inquire about stopping both Aspirin and Plavix 1 week prior to surgery.  Discussed with the patient the risks of bleeding, infection, injury to surrounding structures, post-op recovery, pain control, activity restrictions.  Face-to-face time spent with the patient and care providers was 60 minutes, with more than 50% of the time spent counseling, educating, and coordinating care of the patient.     Melvyn Neth, [facility]

## 2019-09-05 NOTE — Telephone Encounter (Signed)
Called the requesting office spoke with surgery scheduler. Patient is having a Robotic Umbilical Hernia and Plication of Diastasis Recti on 09/27/19 with General anesthesia and post 23 hour observation.

## 2019-09-05 NOTE — Telephone Encounter (Signed)
I called the patient to try to figure out if he is truly having 2 surgeries in 1 day, apparent GI service and general surgery has agreed to proceed with both colonoscopy and umbilical hernia repair on the same day. Will defer to Marrianne Mood to clear the patient on follow up 7/30.  I will remove this clearance from pool.

## 2019-09-06 ENCOUNTER — Telehealth: Payer: Self-pay | Admitting: Surgery

## 2019-09-06 NOTE — Telephone Encounter (Signed)
Incoming call from the patient, he is informed of all dates regarding his surgery and voices understanding.

## 2019-09-06 NOTE — Telephone Encounter (Signed)
Outgoing call is made, left message for patient to call.  Please advise patient of Pre-Admission date/time, COVID Testing date and Surgery date.  Surgery Date: 09/27/19 Preadmission Testing Date: 09/16/19 (phone 8a-1p) Covid Testing Date: 09/23/19 - patient advised to go to the [facility] ([place]) between 8a-1p   Also patient needs to call [phone], between 1-3:00pm the day before surgery, to find out what time to arrive for surgery.

## 2019-09-09 ENCOUNTER — Ambulatory Visit: Payer: Medicare Other | Admitting: Physician Assistant

## 2019-09-09 ENCOUNTER — Other Ambulatory Visit: Payer: Self-pay

## 2019-09-09 ENCOUNTER — Encounter: Payer: Self-pay | Admitting: Physician Assistant

## 2019-09-09 VITALS — BP 166/82 | HR 76 | Ht 71.0 in | Wt 202.8 lb

## 2019-09-09 DIAGNOSIS — Z01810 Encounter for preprocedural cardiovascular examination: Secondary | ICD-10-CM

## 2019-09-09 DIAGNOSIS — I10 Essential (primary) hypertension: Secondary | ICD-10-CM

## 2019-09-09 DIAGNOSIS — R0602 Shortness of breath: Secondary | ICD-10-CM

## 2019-09-09 DIAGNOSIS — Z79899 Other long term (current) drug therapy: Secondary | ICD-10-CM

## 2019-09-09 DIAGNOSIS — I739 Peripheral vascular disease, unspecified: Secondary | ICD-10-CM | POA: Diagnosis not present

## 2019-09-09 DIAGNOSIS — I2510 Atherosclerotic heart disease of native coronary artery without angina pectoris: Secondary | ICD-10-CM | POA: Diagnosis not present

## 2019-09-09 DIAGNOSIS — J449 Chronic obstructive pulmonary disease, unspecified: Secondary | ICD-10-CM

## 2019-09-09 DIAGNOSIS — E1142 Type 2 diabetes mellitus with diabetic polyneuropathy: Secondary | ICD-10-CM

## 2019-09-09 DIAGNOSIS — Z955 Presence of coronary angioplasty implant and graft: Secondary | ICD-10-CM

## 2019-09-09 DIAGNOSIS — D869 Sarcoidosis, unspecified: Secondary | ICD-10-CM

## 2019-09-09 DIAGNOSIS — E782 Mixed hyperlipidemia: Secondary | ICD-10-CM

## 2019-09-09 DIAGNOSIS — R0600 Dyspnea, unspecified: Secondary | ICD-10-CM

## 2019-09-09 DIAGNOSIS — R0609 Other forms of dyspnea: Secondary | ICD-10-CM

## 2019-09-09 NOTE — H&P (View-Only) (Signed)
Office Visit    Patient Name: Ricky Abbott Date of Encounter: 09/12/2019  Primary Care Provider:  Crissie Figures, PA-C Primary Cardiologist:  Ida Rogue, MD  Chief Complaint    No chief complaint on file.    74 year old male with history of CAD s/p stent 20 years ago in [place], [place] 2014 s/p stent at [facility], PTSD, EtOH, depression, DM2, hepatitis, insurance and financial issues, hypertension, hyperlipidemia, COPD, sarcoidosis of the lung and presents for preop for GI surgery /colonoscopy and general for umbilical hernia repair on same day.   Past Medical History    Past Medical History:  Diagnosis Date  . Allergy   . Anxiety   . Asthma   . CAD, multiple vessel   . COPD (chronic obstructive pulmonary disease) ([place])   . Depression   . Diabetes mellitus without complication ([place])   . Dyspnea   . GERD (gastroesophageal reflux disease)   . History of chicken pox   . Hyperlipidemia   . Hypertension   . MI (myocardial infarction) ([place])   . Prostate enlargement   . PTSD (post-traumatic stress disorder)    [place] Vet  . Sarcoidosis of lung [facility])    Past Surgical History:  Procedure Laterality Date  . billary tube placement    . [place], [place]  . CARDIAC CATHETERIZATION     [facility]   . CHOLECYSTECTOMY N/A 06/05/2016   Procedure: LAPAROSCOPIC CHOLECYSTECTOMY with cholangiogram;  Surgeon: Jules Husbands, MD;  Location: [facility] ORS;  Service: General;  Laterality: N/A;  . [place], 05/2012   x 2  . IR GENERIC HISTORICAL  04/30/2016   IR PERC CHOLECYSTOSTOMY 04/30/2016 [facility]-INTERV RAD    Allergies  Allergies  Allergen Reactions  . Peanut-Containing Drug Products Swelling    Swelling, rash, (Peanuts)    History of Present Illness    Ricky Abbott is a 74 y.o. male with PMH as above.  He has history of CAD s/p stent 20 years ago on [place], [place] 2014 s/p stent at [facility] med, PTSD, EtOH,  depression, DM, hepatitis, insurance and financial issues, hypertension, hyperlipidemia, sarcoidosis of lung, and here today for preoperative clearance of both colonoscopy and umbilical repair.  He quit smoking back in 1984. He was previously in [place] with PTSD.  He reportedly served 3 tours.  He reported back and chest pain and April 2014.  He was taken to [facility] and transferred by helicopter to [facility] for stenting.Marland Kitchen  He underwent stress test 12/2013 without ischemia.  There was an old scar in the mid to apical inferior wall.  When last seen by Dr. Rockey Situ, notes indicate that he was an active and sedentary.  He reported atypical chest pain and back pain.  BP was mildly elevated with suggestion that he monitor his BP at home.  He had lower extremity edema with minimal disease by Doppler on the right.  He reported neuropathy.  It was noted he had alcohol abuse with recommendation for cessation. EKG showed NSR without significant ST or T changes.    Today, he returns to clinic and notes bilateral lower extremity claudication symptoms. He denies chest pain, palpitations, pnd, orthopnea, n, v, dizziness, syncope, edema, weight gain, or early satiety. He reports DOE and SOB, stating he cannot walk a block. He notes significant deconditioning.    Home Medications    Prior to Admission medications   Medication Sig Start Date  End Date Taking? Authorizing Provider  aspirin 81 MG tablet Take 81 mg by mouth daily. Reported on 02/19/2015 09/02/13  Yes Minna Merritts, MD  carvedilol (COREG) 12.5 MG tablet Take 1 tablet (12.5 mg total) by mouth 2 (two) times daily with a meal. 05/09/19  Yes Scarboro, Audie Clear, NP  clopidogrel (PLAVIX) 75 MG tablet TAKE 1 TABLET(75 MG) BY MOUTH DAILY 05/09/19  Yes Scarboro, Audie Clear, NP  ezetimibe (ZETIA) 10 MG tablet Take 1 tablet (10 mg total) by mouth daily. 03/08/19  Yes Gollan, Kathlene November, MD  nitroGLYCERIN (NITROSTAT) 0.4 MG SL tablet PLACE 1 TABLET UNDER THE  TONGUE EVERY 5 MINUTES AS NEEDED FOR CHEST PAIN 12/01/16  Yes Crecencio Mc, MD  pantoprazole (PROTONIX) 40 MG tablet Take 1 tablet (40 mg total) by mouth daily. 05/09/19  Yes Scarboro, Audie Clear, NP  rosuvastatin (CRESTOR) 20 MG tablet TAKE 1 TABLET BY MOUTH  DAILY 07/18/19  Yes Scarboro, Audie Clear, NP  tamsulosin (FLOMAX) 0.4 MG CAPS capsule Take 1 capsule (0.4 mg total) by mouth daily. 05/09/19  Yes Scarboro, Audie Clear, NP  telmisartan (MICARDIS) 80 MG tablet TAKE 1 TABLET BY MOUTH  DAILY 07/12/19  Yes Kendell Bane, NP    Review of Systems    He denies chest pain, palpitations, pnd, orthopnea, n, v, dizziness, syncope, edema, weight gain, or early satiety. He reports DOE and SOB, stating he cannot walk a block. He notes significant deconditioning.     All other systems reviewed and are otherwise negative except as noted above.  Physical Exam    VS:  BP (!) 166/82   Pulse 76   Ht 5\' 11"  (1.803 m)   Wt 202 lb 12.8 oz (92 kg)   SpO2 98%   BMI 28.28 kg/m  , BMI Body mass index is 28.28 kg/m. GEN: Well nourished, well developed, in no acute distress. HEENT: normal. Neck: Supple, no JVD, carotid bruits, or masses. Cardiac: RRR, no murmurs, rubs, or gallops. No clubbing, cyanosis, edema.  Radials2+ and equal bilaterally.  Unable to find distal and pedal pulses by Doppler with at least 20 minutes spent attempting to find pulses. Respiratory:  Respirations regular and unlabored, clear to auscultation bilaterally. GI: Soft, nontender, nondistended, BS + x 4. MS: no deformity or atrophy. Skin: warm and dry, no rash. Neuro:  Strength and sensation are intact. Psych: Normal affect.  Accessory Clinical Findings    ECG personally reviewed by me today -NSR  70bpm, RBBB, consider LPFB, LVH, poor R wave progression and TW abnormality consider inferior ischemia, QRS 138- no acute changes.  VITALS Reviewed today   Temp Readings from Last 3 Encounters:  09/02/19 98.7 F (37.1 C) (Oral)  05/16/19 (!)  97.5 F (36.4 C)  04/05/19 98.1 F (36.7 C)   BP Readings from Last 3 Encounters:  09/09/19 (!) 166/82  09/02/19 (!) 132/77  05/16/19 (!) 147/79   Pulse Readings from Last 3 Encounters:  09/09/19 76  09/02/19 89  05/16/19 67    Wt Readings from Last 3 Encounters:  09/09/19 202 lb 12.8 oz (92 kg)  09/02/19 200 lb (90.7 kg)  05/16/19 205 lb 9.6 oz (93.3 kg)     LABS  reviewed today   Lab Results  Component Value Date   WBC 6.3 10/16/2016   HGB 14.2 10/16/2016   HCT 42.9 10/16/2016   MCV 84.5 10/16/2016   PLT 223.0 10/16/2016   Lab Results  Component Value Date   CREATININE 0.93 09/09/2019  BUN 22 09/09/2019   NA 136 09/09/2019   K 4.4 09/09/2019   CL 102 09/09/2019   CO2 20 09/09/2019   Lab Results  Component Value Date   ALT 36 09/09/2019   AST 27 09/09/2019   ALKPHOS 88 09/09/2019   BILITOT 0.4 09/09/2019   Lab Results  Component Value Date   CHOL 169 09/09/2019   HDL 41 09/09/2019   LDLCALC 95 09/09/2019   LDLDIRECT 101 (H) 09/09/2019   TRIG 194 (H) 09/09/2019   CHOLHDL 4.1 09/09/2019    Lab Results  Component Value Date   HGBA1C 6.5 (A) 05/16/2019   Lab Results  Component Value Date   TSH 0.95 02/19/2015     STUDIES/PROCEDURES reviewed today   Echo 04/2016 - Left ventricle: The cavity size was at the upper limits of  normal. Wall thickness was at the upper limits of normal.  Systolic function was normal. The estimated ejection fraction was  in the range of 55% to 60%. Wall motion was normal; there were no  regional wall motion abnormalities. Doppler parameters are  consistent with abnormal left ventricular relaxation (grade 1  diastolic dysfunction).  - Left atrium: The atrium was mildly dilated.  - Right ventricle: The cavity size was normal. Systolic function  was normal.   Arterial study 2018 Widely patent arteries above the right knee and throughout the left side. 50-74% stenosis of the right tibio-peroneal  trunk. 30-49% stenosis of the right proximal anterior tibial artery. Three-vessel run-off. bilaterally. See arterial Doppler study of the same date. PV consult recommended.   Carotid  2018 Heterogeneous plaque, bilaterally. 1-39% bilateral ICA stenosis. Patent vertebral arteries with antegrade flow. Normal subclavian arteries, bilaterally.  Assessment & Plan    Preoperative cardiac evaluation --No active cardiac conditions.  DOE reported but euvolemic and well compensated on exam.  EKG without acute ST/T changes.   --Functional capacity for and approximately 4 METS, given he reports he is unable to walk a block given his claudication symptoms.   --Surgery specific risk low/ intermediate (1 to 5%) for umbilical repair / low (less than 1%) for colonoscopy. --Obtain echo, Lexiscan.  Consider also CXR.Marland KitchenReasonable to proceed with colonoscopy and umbilical hernia surgery if Lexiscan performed and ruled low risk & echo without acute structural changes or reduced EF. --Recommend discontinuing clopidogrel 7 days prior to surgery and initiation of aspirin 81 mg daily at that time.  Restart of Plavix as soon as felt safe to do so after surgery.  Bilateral claudication  --Previous ABIs as above.  He continues to suffer from claudication symptoms.  He reports he is unable to walk a block.  Pulses not able to be ascertained via Doppler today.  Repeat lower extremity studies, ordered today.  Depending on the studies, may refer to PVD for further evaluation.  Further recommendations pending results of studies.  Recommend BP, cholesterol, and glycemic control.  HLD --Continue statin.  11/2018 LDL 108.  Goal LDL <70.  Continue Crestor and Zetia.  HTN --BP today suboptimal at 166/82.  Continue medications. Strict BP goal <130/80.  He has dealt with some frustrations today and notes that this is the reason for his elevated pressure.  Repeat pressures at the end of visit much improved.  Will defer medication  changes at this time and have her monitor at home.  DM2 --Strict glycemic control recommended.  05/2019 A1c 6.5.  Continue telmisartan and Coreg.  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  Medication changes: Reassess BP at RTC Labs ordered: BMET, lipid, liver Studies / Imaging ordered: Lexi, echo, ABIs Future considerations: CXR will defer for now  Disposition: 3 months or 11/2019    Arvil Chaco, PA-C 09/12/2019

## 2019-09-09 NOTE — Progress Notes (Signed)
Office Visit    Patient Name: Ricky Abbott Date of Encounter: 09/12/2019  Primary Care Provider:  Crissie Figures, PA-C Primary Cardiologist:  Ida Rogue, MD  Chief Complaint    No chief complaint on file.    74 year old male with history of CAD s/p stent 20 years ago in [place], [place] 2014 s/p stent at [facility], PTSD, EtOH, depression, DM2, hepatitis, insurance and financial issues, hypertension, hyperlipidemia, COPD, sarcoidosis of the lung and presents for preop for GI surgery /colonoscopy and general for umbilical hernia repair on same day.   Past Medical History    Past Medical History:  Diagnosis Date  . Allergy   . Anxiety   . Asthma   . CAD, multiple vessel   . COPD (chronic obstructive pulmonary disease) ([place])   . Depression   . Diabetes mellitus without complication ([place])   . Dyspnea   . GERD (gastroesophageal reflux disease)   . History of chicken pox   . Hyperlipidemia   . Hypertension   . MI (myocardial infarction) ([place])   . Prostate enlargement   . PTSD (post-traumatic stress disorder)    [place] Vet  . Sarcoidosis of lung [facility])    Past Surgical History:  Procedure Laterality Date  . billary tube placement    . [place], [place]  . CARDIAC CATHETERIZATION     [facility]   . CHOLECYSTECTOMY N/A 06/05/2016   Procedure: LAPAROSCOPIC CHOLECYSTECTOMY with cholangiogram;  Surgeon: Jules Husbands, MD;  Location: [facility] ORS;  Service: General;  Laterality: N/A;  . [place], 05/2012   x 2  . IR GENERIC HISTORICAL  04/30/2016   IR PERC CHOLECYSTOSTOMY 04/30/2016 [facility]-INTERV RAD    Allergies  Allergies  Allergen Reactions  . Peanut-Containing Drug Products Swelling    Swelling, rash, (Peanuts)    History of Present Illness    Ricky Abbott is a 74 y.o. male with PMH as above.  He has history of CAD s/p stent 20 years ago on [place], [place] 2014 s/p stent at [facility] med, PTSD, EtOH,  depression, DM, hepatitis, insurance and financial issues, hypertension, hyperlipidemia, sarcoidosis of lung, and here today for preoperative clearance of both colonoscopy and umbilical repair.  He quit smoking back in 1984. He was previously in [place] with PTSD.  He reportedly served 3 tours.  He reported back and chest pain and April 2014.  He was taken to [facility] and transferred by helicopter to [facility] for stenting.Marland Kitchen  He underwent stress test 12/2013 without ischemia.  There was an old scar in the mid to apical inferior wall.  When last seen by Dr. Rockey Situ, notes indicate that he was an active and sedentary.  He reported atypical chest pain and back pain.  BP was mildly elevated with suggestion that he monitor his BP at home.  He had lower extremity edema with minimal disease by Doppler on the right.  He reported neuropathy.  It was noted he had alcohol abuse with recommendation for cessation. EKG showed NSR without significant ST or T changes.    Today, he returns to clinic and notes bilateral lower extremity claudication symptoms. He denies chest pain, palpitations, pnd, orthopnea, n, v, dizziness, syncope, edema, weight gain, or early satiety. He reports DOE and SOB, stating he cannot walk a block. He notes significant deconditioning.    Home Medications    Prior to Admission medications   Medication Sig Start Date  End Date Taking? Authorizing Provider  aspirin 81 MG tablet Take 81 mg by mouth daily. Reported on 02/19/2015 09/02/13  Yes Minna Merritts, MD  carvedilol (COREG) 12.5 MG tablet Take 1 tablet (12.5 mg total) by mouth 2 (two) times daily with a meal. 05/09/19  Yes Scarboro, Audie Clear, NP  clopidogrel (PLAVIX) 75 MG tablet TAKE 1 TABLET(75 MG) BY MOUTH DAILY 05/09/19  Yes Scarboro, Audie Clear, NP  ezetimibe (ZETIA) 10 MG tablet Take 1 tablet (10 mg total) by mouth daily. 03/08/19  Yes Gollan, Kathlene November, MD  nitroGLYCERIN (NITROSTAT) 0.4 MG SL tablet PLACE 1 TABLET UNDER THE  TONGUE EVERY 5 MINUTES AS NEEDED FOR CHEST PAIN 12/01/16  Yes Crecencio Mc, MD  pantoprazole (PROTONIX) 40 MG tablet Take 1 tablet (40 mg total) by mouth daily. 05/09/19  Yes Scarboro, Audie Clear, NP  rosuvastatin (CRESTOR) 20 MG tablet TAKE 1 TABLET BY MOUTH  DAILY 07/18/19  Yes Scarboro, Audie Clear, NP  tamsulosin (FLOMAX) 0.4 MG CAPS capsule Take 1 capsule (0.4 mg total) by mouth daily. 05/09/19  Yes Scarboro, Audie Clear, NP  telmisartan (MICARDIS) 80 MG tablet TAKE 1 TABLET BY MOUTH  DAILY 07/12/19  Yes Kendell Bane, NP    Review of Systems    He denies chest pain, palpitations, pnd, orthopnea, n, v, dizziness, syncope, edema, weight gain, or early satiety. He reports DOE and SOB, stating he cannot walk a block. He notes significant deconditioning.     All other systems reviewed and are otherwise negative except as noted above.  Physical Exam    VS:  BP (!) 166/82   Pulse 76   Ht 5\' 11"  (1.803 m)   Wt 202 lb 12.8 oz (92 kg)   SpO2 98%   BMI 28.28 kg/m  , BMI Body mass index is 28.28 kg/m. GEN: Well nourished, well developed, in no acute distress. HEENT: normal. Neck: Supple, no JVD, carotid bruits, or masses. Cardiac: RRR, no murmurs, rubs, or gallops. No clubbing, cyanosis, edema.  Radials2+ and equal bilaterally.  Unable to find distal and pedal pulses by Doppler with at least 20 minutes spent attempting to find pulses. Respiratory:  Respirations regular and unlabored, clear to auscultation bilaterally. GI: Soft, nontender, nondistended, BS + x 4. MS: no deformity or atrophy. Skin: warm and dry, no rash. Neuro:  Strength and sensation are intact. Psych: Normal affect.  Accessory Clinical Findings    ECG personally reviewed by me today -NSR  70bpm, RBBB, consider LPFB, LVH, poor R wave progression and TW abnormality consider inferior ischemia, QRS 138- no acute changes.  VITALS Reviewed today   Temp Readings from Last 3 Encounters:  09/02/19 98.7 F (37.1 C) (Oral)  05/16/19 (!)  97.5 F (36.4 C)  04/05/19 98.1 F (36.7 C)   BP Readings from Last 3 Encounters:  09/09/19 (!) 166/82  09/02/19 (!) 132/77  05/16/19 (!) 147/79   Pulse Readings from Last 3 Encounters:  09/09/19 76  09/02/19 89  05/16/19 67    Wt Readings from Last 3 Encounters:  09/09/19 202 lb 12.8 oz (92 kg)  09/02/19 200 lb (90.7 kg)  05/16/19 205 lb 9.6 oz (93.3 kg)     LABS  reviewed today   Lab Results  Component Value Date   WBC 6.3 10/16/2016   HGB 14.2 10/16/2016   HCT 42.9 10/16/2016   MCV 84.5 10/16/2016   PLT 223.0 10/16/2016   Lab Results  Component Value Date   CREATININE 0.93 09/09/2019  BUN 22 09/09/2019   NA 136 09/09/2019   K 4.4 09/09/2019   CL 102 09/09/2019   CO2 20 09/09/2019   Lab Results  Component Value Date   ALT 36 09/09/2019   AST 27 09/09/2019   ALKPHOS 88 09/09/2019   BILITOT 0.4 09/09/2019   Lab Results  Component Value Date   CHOL 169 09/09/2019   HDL 41 09/09/2019   LDLCALC 95 09/09/2019   LDLDIRECT 101 (H) 09/09/2019   TRIG 194 (H) 09/09/2019   CHOLHDL 4.1 09/09/2019    Lab Results  Component Value Date   HGBA1C 6.5 (A) 05/16/2019   Lab Results  Component Value Date   TSH 0.95 02/19/2015     STUDIES/PROCEDURES reviewed today   Echo 04/2016 - Left ventricle: The cavity size was at the upper limits of  normal. Wall thickness was at the upper limits of normal.  Systolic function was normal. The estimated ejection fraction was  in the range of 55% to 60%. Wall motion was normal; there were no  regional wall motion abnormalities. Doppler parameters are  consistent with abnormal left ventricular relaxation (grade 1  diastolic dysfunction).  - Left atrium: The atrium was mildly dilated.  - Right ventricle: The cavity size was normal. Systolic function  was normal.   Arterial study 2018 Widely patent arteries above the right knee and throughout the left side. 50-74% stenosis of the right tibio-peroneal  trunk. 30-49% stenosis of the right proximal anterior tibial artery. Three-vessel run-off. bilaterally. See arterial Doppler study of the same date. PV consult recommended.   Carotid  2018 Heterogeneous plaque, bilaterally. 1-39% bilateral ICA stenosis. Patent vertebral arteries with antegrade flow. Normal subclavian arteries, bilaterally.  Assessment & Plan    Preoperative cardiac evaluation --No active cardiac conditions.  DOE reported but euvolemic and well compensated on exam.  EKG without acute ST/T changes.   --Functional capacity for and approximately 4 METS, given he reports he is unable to walk a block given his claudication symptoms.   --Surgery specific risk low/ intermediate (1 to 5%) for umbilical repair / low (less than 1%) for colonoscopy. --Obtain echo, Lexiscan.  Consider also CXR.Marland KitchenReasonable to proceed with colonoscopy and umbilical hernia surgery if Lexiscan performed and ruled low risk & echo without acute structural changes or reduced EF. --Recommend discontinuing clopidogrel 7 days prior to surgery and initiation of aspirin 81 mg daily at that time.  Restart of Plavix as soon as felt safe to do so after surgery.  Bilateral claudication  --Previous ABIs as above.  He continues to suffer from claudication symptoms.  He reports he is unable to walk a block.  Pulses not able to be ascertained via Doppler today.  Repeat lower extremity studies, ordered today.  Depending on the studies, may refer to PVD for further evaluation.  Further recommendations pending results of studies.  Recommend BP, cholesterol, and glycemic control.  HLD --Continue statin.  11/2018 LDL 108.  Goal LDL <70.  Continue Crestor and Zetia.  HTN --BP today suboptimal at 166/82.  Continue medications. Strict BP goal <130/80.  He has dealt with some frustrations today and notes that this is the reason for his elevated pressure.  Repeat pressures at the end of visit much improved.  Will defer medication  changes at this time and have her monitor at home.  DM2 --Strict glycemic control recommended.  05/2019 A1c 6.5.  Continue telmisartan and Coreg.  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  Medication changes: Reassess BP at RTC Labs ordered: BMET, lipid, liver Studies / Imaging ordered: Lexi, echo, ABIs Future considerations: CXR will defer for now  Disposition: 3 months or 11/2019    Arvil Chaco, PA-C 09/12/2019

## 2019-09-09 NOTE — Patient Instructions (Addendum)
Medication Instructions:   1)  You may hold plavix (clopidegrel) x 7 days prior to your surgery/ procedure  2) Continue aspirin   *If you need a refill on your cardiac medications before your next appointment, please call your pharmacy*   Lab Work: - Your physician recommends that you have lab work today: lipid/ direct LDL/ CMET  If you have labs (blood work) drawn today and your tests are completely normal, you will receive your results only by: Marland Kitchen MyChart Message (if you have MyChart) OR . A paper copy in the mail If you have any lab test that is abnormal or we need to change your treatment, we will call you to review the results.   Testing/Procedures: 1) Echocardiogram - Your physician has requested that you have an echocardiogram. Echocardiography is a painless test that uses sound waves to create images of your heart. It provides your doctor with information about the size and shape of your heart and how well your heart's chambers and valves are working. This procedure takes approximately one hour. There are no restrictions for this procedure.  2) Lower extremity arterial duplex & ABI's - Your physician has requested that you have an ankle brachial index (ABI). During this test an ultrasound and blood pressure cuff are used to evaluate the arteries that supply the arms and legs with blood. Allow thirty minutes for this exam. There are no restrictions or special instructions.  - Your physician has requested that you have a lower extremity arterial duplex. This test is an ultrasound of the arteries in the legs. It looks at arterial blood flow in the legs. Allow one hour for Lower Arterial scans. There are no restrictions or special instructions   3) Lexiscan Myoview (Chemical Stress Test) - Your physician has requested that you have a lexiscan myoview.   [place]  Your caregiver has ordered a Stress Test with nuclear imaging. The purpose of this test is to evaluate the blood  supply to your heart muscle. This procedure is referred to as a "Non-Invasive Stress Test." This is because other than having an IV started in your vein, nothing is inserted or "invades" your body. Cardiac stress tests are done to find areas of poor blood flow to the heart by determining the extent of coronary artery disease (CAD). Some patients exercise on a treadmill, which naturally increases the blood flow to your heart, while others who are  unable to walk on a treadmill due to physical limitations have a pharmacologic/chemical stress agent called Lexiscan . This medicine will mimic walking on a treadmill by temporarily increasing your coronary blood flow.   Please note: these test may take anywhere between 2-4 hours to complete  PLEASE REPORT TO [place] AT THE FIRST DESK WILL DIRECT YOU WHERE TO GO  Date of Procedure:_____________________________________  Arrival Time for Procedure:______________________________  Instructions regarding medication:   _x___ : You may take all of your regular medications with enough water to get them down safely the morning of your test  PLEASE NOTIFY THE OFFICE AT LEAST 24 HOURS IN ADVANCE IF YOU ARE UNABLE TO [place].  [phone] AND  PLEASE NOTIFY NUCLEAR MEDICINE AT [facility] AT LEAST 24 HOURS IN ADVANCE IF YOU ARE UNABLE TO KEEP YOUR APPOINTMENT. [phone]  How to prepare for your Myoview test:  1. Do not eat or drink after midnight 2. No caffeine for 24 hours prior to test 3. No smoking 24 hours prior to test.  4. Your medication may be taken with water.  If your doctor stopped a medication because of this test, do not take that medication. 5. Ladies, please do not wear dresses.  Skirts or pants are appropriate. Please wear a short sleeve shirt. 6. No perfume, cologne or lotion. 7. Wear comfortable walking shoes. No heels!    Follow-Up: At [facility], you and your health needs are our  priority.  As part of our continuing mission to provide you with exceptional heart care, we have created designated Provider Care Teams.  These Care Teams include your primary Cardiologist (physician) and Advanced Practice Providers (APPs -  Physician Assistants and Nurse Practitioners) who all work together to provide you with the care you need, when you need it.  We recommend signing up for the patient portal called "MyChart".  Sign up information is provided on this After Visit Summary.  MyChart is used to connect with patients for Virtual Visits (Telemedicine).  Patients are able to view lab/test results, encounter notes, upcoming appointments, etc.  Non-urgent messages can be sent to your provider as well.   To learn more about what you can do with MyChart, go to [email or web address].    Your next appointment:   October 2021   The format for your next appointment:   In Person  Provider:    You may see Ida Rogue, MD or one of the following Advanced Practice Providers on your designated Care Team:    Murray Hodgkins, NP  Christell Faith, PA-C  Marrianne Mood, PA-C    Other Instructions    Echocardiogram An echocardiogram is a procedure that uses painless sound waves (ultrasound) to produce an image of the heart. Images from an echocardiogram can provide important information about:  Signs of coronary artery disease (CAD).  Aneurysm detection. An aneurysm is a weak or damaged part of an artery wall that bulges out from the normal force of blood pumping through the body.  Heart size and shape. Changes in the size or shape of the heart can be associated with certain conditions, including heart failure, aneurysm, and CAD.  Heart muscle function.  Heart valve function.  Signs of a past heart attack.  Fluid buildup around the heart.  Thickening of the heart muscle.  A tumor or infectious growth around the heart valves. Tell a health care provider about:  Any  allergies you have.  All medicines you are taking, including vitamins, herbs, eye drops, creams, and over-the-counter medicines.  Any blood disorders you have.  Any surgeries you have had.  Any medical conditions you have.  Whether you are pregnant or may be pregnant. What are the risks? Generally, this is a safe procedure. However, problems may occur, including:  Allergic reaction to dye (contrast) that may be used during the procedure. What happens before the procedure? No specific preparation is needed. You may eat and drink normally. What happens during the procedure?   An IV tube may be inserted into one of your veins.  You may receive contrast through this tube. A contrast is an injection that improves the quality of the pictures from your heart.  A gel will be applied to your chest.  A wand-like tool (transducer) will be moved over your chest. The gel will help to transmit the sound waves from the transducer.  The sound waves will harmlessly bounce off of your heart to allow the heart images to be captured in real-time motion. The images will be recorded on a computer. The procedure  may vary among health care providers and hospitals. What happens after the procedure?  You may return to your normal, everyday life, including diet, activities, and medicines, unless your health care provider tells you not to do that. Summary  An echocardiogram is a procedure that uses painless sound waves (ultrasound) to produce an image of the heart.  Images from an echocardiogram can provide important information about the size and shape of your heart, heart muscle function, heart valve function, and fluid buildup around your heart.  You do not need to do anything to prepare before this procedure. You may eat and drink normally.  After the echocardiogram is completed, you may return to your normal, everyday life, unless your health care provider tells you not to do that. This  information is not intended to replace advice given to you by your health care provider. Make sure you discuss any questions you have with your health care provider. Document Revised: 05/20/2018 Document Reviewed: 03/01/2016 Elsevier Patient Education  2020 [place].    Cardiac Nuclear Scan A cardiac nuclear scan is a test that measures blood flow to the heart when a person is resting and when he or she is exercising. The test looks for problems such as:  Not enough blood reaching a portion of the heart.  The heart muscle not working normally. You may need this test if:  You have heart disease.  You have had abnormal lab results.  You have had heart surgery or a balloon procedure to open up blocked arteries (angioplasty).  You have chest pain.  You have shortness of breath. In this test, a radioactive dye (tracer) is injected into your bloodstream. After the tracer has traveled to your heart, an imaging device is used to measure how much of the tracer is absorbed by or distributed to various areas of your heart. This procedure is usually done at a hospital and takes 2-4 hours. Tell a health care provider about:  Any allergies you have.  All medicines you are taking, including vitamins, herbs, eye drops, creams, and over-the-counter medicines.  Any problems you or family members have had with anesthetic medicines.  Any blood disorders you have.  Any surgeries you have had.  Any medical conditions you have.  Whether you are pregnant or may be pregnant. What are the risks? Generally, this is a safe procedure. However, problems may occur, including:  Serious chest pain and heart attack. This is only a risk if the stress portion of the test is done.  Rapid heartbeat.  Sensation of warmth in your chest. This usually passes quickly.  Allergic reaction to the tracer. What happens before the procedure?  Ask your health care provider about changing or stopping your  regular medicines. This is especially important if you are taking diabetes medicines or blood thinners.  Follow instructions from your health care provider about eating or drinking restrictions.  Remove your jewelry on the day of the procedure. What happens during the procedure?  An IV will be inserted into one of your veins.  Your health care provider will inject a small amount of radioactive tracer through the IV.  You will wait for 20-40 minutes while the tracer travels through your bloodstream.  Your heart activity will be monitored with an electrocardiogram (ECG).  You will lie down on an exam table.  Images of your heart will be taken for about 15-20 minutes.  You may also have a stress test. For this test, one of the following may  be done: ? You will exercise on a treadmill or stationary bike. While you exercise, your heart's activity will be monitored with an ECG, and your blood pressure will be checked. ? You will be given medicines that will increase blood flow to parts of your heart. This is done if you are unable to exercise.  When blood flow to your heart has peaked, a tracer will again be injected through the IV.  After 20-40 minutes, you will get back on the exam table and have more images taken of your heart.  Depending on the type of tracer used, scans may need to be repeated 3-4 hours later.  Your IV line will be removed when the procedure is over. The procedure may vary among health care providers and hospitals. What happens after the procedure?  Unless your health care provider tells you otherwise, you may return to your normal schedule, including diet, activities, and medicines.  Unless your health care provider tells you otherwise, you may increase your fluid intake. This will help to flush the contrast dye from your body. Drink enough fluid to keep your urine pale yellow.  Ask your health care provider, or the department that is doing the test: ? When will  my results be ready? ? How will I get my results? Summary  A cardiac nuclear scan measures the blood flow to the heart when a person is resting and when he or she is exercising.  Tell your health care provider if you are pregnant.  Before the procedure, ask your health care provider about changing or stopping your regular medicines. This is especially important if you are taking diabetes medicines or blood thinners.  After the procedure, unless your health care provider tells you otherwise, increase your fluid intake. This will help flush the contrast dye from your body.  After the procedure, unless your health care provider tells you otherwise, you may return to your normal schedule, including diet, activities, and medicines. This information is not intended to replace advice given to you by your health care provider. Make sure you discuss any questions you have with your health care provider. Document Revised: 07/13/2017 Document Reviewed: 07/13/2017 Elsevier Patient Education  [place].

## 2019-09-10 LAB — LIPID PANEL
Chol/HDL Ratio: 4.1 ratio (ref 0.0–5.0)
Cholesterol, Total: 169 mg/dL (ref 100–199)
HDL: 41 mg/dL (ref >39)
LDL Chol Calc (NIH): 95 mg/dL (ref 0–99)
Triglycerides: 194 mg/dL — ABNORMAL HIGH (ref 0–149)
VLDL Cholesterol Cal: 33 mg/dL (ref 5–40)

## 2019-09-10 LAB — COMPREHENSIVE METABOLIC PANEL
ALT: 36 IU/L (ref 0–44)
AST: 27 IU/L (ref 0–40)
Albumin/Globulin Ratio: 1.7 (ref 1.2–2.2)
Albumin: 4.5 g/dL (ref 3.7–4.7)
Alkaline Phosphatase: 88 IU/L (ref 48–121)
BUN/Creatinine Ratio: 24 (ref 10–24)
BUN: 22 mg/dL (ref 8–27)
Bilirubin Total: 0.4 mg/dL (ref 0.0–1.2)
CO2: 20 mmol/L (ref 20–29)
Calcium: 9.3 mg/dL (ref 8.6–10.2)
Chloride: 102 mmol/L (ref 96–106)
Creatinine, Ser: 0.93 mg/dL (ref 0.76–1.27)
GFR calc Af Amer: 93 mL/min/{1.73_m2} (ref >59)
GFR calc non Af Amer: 81 mL/min/{1.73_m2} (ref >59)
Globulin, Total: 2.6 g/dL (ref 1.5–4.5)
Glucose: 111 mg/dL — ABNORMAL HIGH (ref 65–99)
Potassium: 4.4 mmol/L (ref 3.5–5.2)
Sodium: 136 mmol/L (ref 134–144)
Total Protein: 7.1 g/dL (ref 6.0–8.5)

## 2019-09-10 LAB — LDL CHOLESTEROL, DIRECT: LDL Direct: 101 mg/dL — ABNORMAL HIGH (ref 0–99)

## 2019-09-15 ENCOUNTER — Telehealth: Payer: Self-pay | Admitting: Physician Assistant

## 2019-09-15 ENCOUNTER — Ambulatory Visit: Payer: Medicare Other | Admitting: Adult Health

## 2019-09-15 DIAGNOSIS — I2510 Atherosclerotic heart disease of native coronary artery without angina pectoris: Secondary | ICD-10-CM

## 2019-09-15 MED ORDER — ROSUVASTATIN CALCIUM 40 MG PO TABS: 40.0000 mg | ORAL_TABLET | Freq: Every day | ORAL | 3 refills | Status: DC

## 2019-09-15 NOTE — Telephone Encounter (Signed)
Patient calling to discuss recent lab testing results  ° °Please call  ° °

## 2019-09-15 NOTE — Telephone Encounter (Signed)
Call to patient to review labs.   He verbalized understanding and reported that Elenor Quinones, PA wanted to speak with his PCP. He gave information on [place], [place], to speak with her you should visit the website [facility] and search provider.   rx updated and [facility] 6-8 weeks.   Advised pt to call for any further questions or concerns.

## 2019-09-15 NOTE — Telephone Encounter (Signed)
-----   Message from Arvil Chaco, PA-C sent at 09/14/2019  4:46 PM EDT ----- Renal function and electrolytes stable. Cholesterol shows LDL 101 and above goal of below 70. Triglycerides also elevated at 194. Would recommend he increase to Crestor 40mg  daily and recheck lipids and liver in 6 - 8 weeks. We can wait to discuss this at his next office visit, if preferred.

## 2019-09-16 ENCOUNTER — Other Ambulatory Visit: Payer: Self-pay

## 2019-09-16 ENCOUNTER — Encounter
Admission: RE | Admit: 2019-09-16 | Discharge: 2019-09-16 | Disposition: A | Discharge status: Home / Self Care | Payer: Medicare Other | Source: Ambulatory Visit | Attending: Surgery | Admitting: Surgery

## 2019-09-16 NOTE — Patient Instructions (Addendum)
Your procedure is scheduled on: Tuesday, August 17 Report to Day Surgery on the 2nd floor of the [facility]. To find out your arrival time, please call [phone] between 1PM - 3PM on: Monday, August 16  REMEMBER: Instructions that are not followed completely may result in serious medical risk, up to and including death; or upon the discretion of your surgeon and anesthesiologist your surgery may need to be rescheduled.  Do not eat food after midnight the night before surgery.  No gum chewing, lozengers or hard candies.  You may however, drink water up to 2 hours before you are scheduled to arrive for your surgery. Do not drink anything within 2 hours of your scheduled arrival time.  TAKE THESE MEDICATIONS THE MORNING OF SURGERY WITH A SIP OF WATER:  1.  Carvedilol (Coreg) 2.  Pantoprazole (Protonix) - (take one the night before and one on the morning of surgery - helps to prevent nausea after surgery.) 3.  Tamsulosin (Flomax) 4.  Ezetimibe (Zetia) 5.  Rosuvastatin (Crestor)  Stop Plavix 7 days prior to surgery per Cardiologist.  Stop Anti-inflammatories (NSAIDS) such as Advil, Aleve, Ibuprofen, Motrin, Naproxen, Naprosyn and Aspirin based products such as Excedrin, Goodys Powder, BC Powder. (May take Tylenol or Acetaminophen if needed.)  Stop ANY OVER THE COUNTER supplements until after surgery.  No Alcohol for 24 hours before or after surgery.  No Smoking including e-cigarettes for 24 hours prior to surgery.   On the morning of surgery brush your teeth with toothpaste and water, you may rinse your mouth with mouthwash if you wish. Do not swallow any toothpaste or mouthwash.  Do not wear jewelry.  Do not wear lotions, powders, or perfumes.   Do not shave 48 hours prior to surgery.   Contact lenses, hearing aids and dentures may not be worn into surgery.  Do not bring valuables to the hospital. [facility] is not responsible for any missing/lost belongings or  valuables.   Use CHG Soap as directed on instruction sheet.  Notify your doctor if there is any change in your medical condition (cold, fever, infection).  Wear comfortable clothing (specific to your surgery type) to the hospital.  Plan for stool softeners for home use; pain medications have a tendency to cause constipation. You can also help prevent constipation by eating foods high in fiber such as fruits and vegetables and drinking plenty of fluids as your diet allows.  After surgery, you can help prevent lung complications by doing breathing exercises.  Take deep breaths and cough every 1-2 hours. Your doctor may order a device called an Incentive Spirometer to help you take deep breaths. When coughing or sneezing, hold a pillow firmly against your incision with both hands. This is called "splinting." Doing this helps protect your incision. It also decreases belly discomfort.  Please call the [place] Dept. at [phone] if you have any questions about these instructions.

## 2019-09-20 ENCOUNTER — Telehealth: Payer: Self-pay | Admitting: Emergency Medicine

## 2019-09-20 NOTE — Telephone Encounter (Signed)
Called Dr Donivan Scull office in regards to cardiac clearance, spoke to [facility]. Per Lovena Le she is going to send a message to the back and have someone call the office for an update.

## 2019-09-20 NOTE — Telephone Encounter (Signed)
[place] [facility] calling to get details on this clearance status. Office can be reached at [phone]

## 2019-09-20 NOTE — Telephone Encounter (Signed)
   Primary Cardiologist: Ida Rogue, MD  Chart reviewed as part of pre-operative protocol coverage. Patient was contacted 09/20/2019 in reference to pre-operative risk assessment for pending surgery as outlined below.  Ricky Abbott was last seen on 09/09/2019 by Marrianne Mood PA-C.  Due to new or worsening symptoms, Ricky Abbott will require a follow-up visit for further pre-operative risk assessment. A stress Myoview is ordered for 8/12 and an echo cardiogram for 8/27.  Pre-op covering staff: - Please schedule appointment and call patient to inform them. If patient already had an upcoming appointment within acceptable timeframe, please add "pre-op clearance" to the appointment notes so provider is aware. - Please contact requesting surgeon's office via preferred method (i.e, phone, fax) to inform them of need for appointment prior to surgery.  Kerin Ransom, PA-C 09/20/2019, 3:28 PM

## 2019-09-21 ENCOUNTER — Telehealth: Payer: Self-pay

## 2019-09-21 NOTE — Telephone Encounter (Signed)
[facility] calling to check on status  States the surgery was scheduled for 09/27/19

## 2019-09-21 NOTE — Telephone Encounter (Signed)
Spoke with Caryl Pina @ [place] office regarding Cardiac Clearance- it has been sent to the pre-op pool for doctor to review. They will send it after doctors reviews.

## 2019-09-21 NOTE — Telephone Encounter (Signed)
Preop visit note was sent to [facility] when visit was completed.  Recommendation was to complete further workup with Lexiscan and echo. If Lexiscan (scheduled tomorrow) ruled low risk, reasonable to proceed to surgery prior to echo.

## 2019-09-22 ENCOUNTER — Telehealth: Payer: Self-pay | Admitting: Emergency Medicine

## 2019-09-22 ENCOUNTER — Other Ambulatory Visit: Payer: Self-pay

## 2019-09-22 ENCOUNTER — Encounter
Admission: RE | Admit: 2019-09-22 | Discharge: 2019-09-22 | Disposition: A | Discharge status: Home / Self Care | Payer: Medicare Other | Source: Ambulatory Visit | Attending: Physician Assistant | Admitting: Physician Assistant

## 2019-09-22 DIAGNOSIS — R0602 Shortness of breath: Secondary | ICD-10-CM | POA: Diagnosis not present

## 2019-09-22 MED ORDER — TECHNETIUM TC 99M TETROFOSMIN IV KIT
30.0000 | PACK | Freq: Once | INTRAVENOUS | Status: AC | PRN
  Administered 2019-09-22: 31.893 via INTRAVENOUS

## 2019-09-22 MED ORDER — TECHNETIUM TC 99M TETROFOSMIN IV KIT
10.0000 | PACK | Freq: Once | INTRAVENOUS | Status: AC | PRN
  Administered 2019-09-22: 10.796 via INTRAVENOUS

## 2019-09-22 MED ORDER — REGADENOSON 0.4 MG/5ML IV SOLN
0.4000 mg | Freq: Once | INTRAVENOUS | Status: AC
  Administered 2019-09-22: 0.4 mg via INTRAVENOUS

## 2019-09-22 NOTE — Telephone Encounter (Signed)
Iva from Dr Donivan Scull office called back in regards to clearance. Iva states patient needs further cardiac workup. Stress test scheduled for tomorrow 8/13. Per Iva if everything is fine clearance sheet will be faxed back to [place].

## 2019-09-22 NOTE — Telephone Encounter (Signed)
Spoke to [employer], I advised that patient is having stress test and then we will be able to send over cardiac clearance.   RN verbalized understanding and had no further questions at this time.

## 2019-09-23 ENCOUNTER — Other Ambulatory Visit
Admission: RE | Admit: 2019-09-23 | Discharge: 2019-09-23 | Disposition: A | Discharge status: Home / Self Care | Payer: Medicare Other | Source: Ambulatory Visit | Attending: Surgery | Admitting: Surgery

## 2019-09-23 ENCOUNTER — Encounter (HOSPITAL_COMMUNITY): Payer: Self-pay | Admitting: Urgent Care

## 2019-09-23 DIAGNOSIS — Z20822 Contact with and (suspected) exposure to covid-19: Secondary | ICD-10-CM | POA: Insufficient documentation

## 2019-09-23 DIAGNOSIS — Z01812 Encounter for preprocedural laboratory examination: Secondary | ICD-10-CM | POA: Diagnosis present

## 2019-09-23 LAB — CBC
HCT: 44.7 % (ref 39.0–52.0)
Hemoglobin: 14.8 g/dL (ref 13.0–17.0)
MCH: 28.5 pg (ref 26.0–34.0)
MCHC: 33.1 g/dL (ref 30.0–36.0)
MCV: 86 fL (ref 80.0–100.0)
Platelets: 204 10*3/uL (ref 150–400)
RBC: 5.2 MIL/uL (ref 4.22–5.81)
RDW: 13.6 % (ref 11.5–15.5)
WBC: 7.4 10*3/uL (ref 4.0–10.5)
nRBC: 0 % (ref 0.0–0.2)

## 2019-09-23 LAB — NM MYOCAR MULTI W/SPECT W/WALL MOTION / EF
LV dias vol: 73 mL (ref 62–150)
LV sys vol: 32 mL
Peak HR: 94 {beats}/min
Percent HR: 64 %
Rest HR: 67 {beats}/min
SDS: 0
SRS: 3
SSS: 2
TID: 1.16

## 2019-09-23 LAB — SARS CORONAVIRUS 2 (TAT 6-24 HRS): SARS Coronavirus 2: NEGATIVE

## 2019-09-23 NOTE — Pre-Procedure Instructions (Signed)
Patient arrived for covid testing and he was very disgruntled. He had many negative things to say about [facility] and the process of which he has had to undergo.  He was upset that he needed blood work today since he had some bloodwork about 2 weeks ago for cardiology.  I tried to explain what it was for and he felt that this should have been known and done already.

## 2019-09-23 NOTE — Progress Notes (Signed)
[facility] Perioperative Services  Pre-Admission/Anesthesia Testing Clinical Review  Date: 09/23/19  Patient Demographics:  Name: Ricky Abbott DOB:   [birth year] MRN:   [id number]  Planned Surgical Procedure(s):    Case: [id number] Date/Time: 09/27/19 1145   Procedure: XI ROBOT ASSISTED UMBILICAL HERNIA REPAIR AND PLICATION OF DIASTASIS RECTI (N/A )   Anesthesia type: General   Pre-op diagnosis: Umbilical hernia, diastasis recti   Location: [facility] OR ROOM 04 / [place] ORS FOR ANESTHESIA GROUP   Surgeons: Olean Ree, MD     NOTE: Available PAT nursing documentation and vital signs have been reviewed. Clinical nursing staff has updated patient's PMH/PSHx, current medication list, and drug allergies/intolerances to ensure comprehensive history available to assist in medical decision making as it pertains to the aforementioned surgical procedure and anticipated anesthetic course.   Clinical Discussion:  Ricky Abbott is a 74 y.o. male who is submitted for pre-surgical anesthesia review and clearance prior to him undergoing the above procedure. Patient is a Former Smoker (100 pack years; quit 03/1981). Pertinent PMH includes: CAD, MI (2014; s/p PCI), HTN, HLD, T2DM, DOE, asthma, COPD, pulmonary sarcoidosis, GERD (on daily PPI), hepatitis, anxiety, depression, PTSD ([place] veteran).  Patient is followed by cardiology Rockey Situ, MD). He was last seen in the cardiology clinic on 09/09/2019; notes reviewed.  At the time of his last clinic visit patient denied chest pain, palpitations, PND, orthopnea, vertiginous symptoms, peripheral edema, and presyncope/syncope.  Patient with chronic exertional dyspnea and shortness of breath at rest.  Functional capacity documented as being < 4 METS; patient states, "I cannot walk a block".  Patient complained of significant overall deconditioning.  Patient also complained of lower extremity claudication.  Last stress test and 12/2013  revealed no evidence of ischemia, however there was an old scar to the mid apical inferior wall. TTE in 04/2016 revealed and LVEF of 55-60%.  Patient with known trifascicular block (RBBB, LPFB, LBBB). Given patient's PMH, current symptoms, and length of time since his last ischemic evaluation, the decision was made to pursue repeat TTE, Lexiscan, and CXR. Lexiscan repeated on 09/22/2019 showing mild to moderate ischemia in the inferior wall; wall motion normal; estimated LVEF 63% (see full interpretation below). Pending TTE and CXR.   This patient is on daily DAPT therapy using ASA and clopidogrel. He has been instructed on recommendations for holding his antiplatelet medications for 7 days prior to his procedure. The patient has been instructed that his last dose of his anticoagulant will be on 09/20/2019.  He denies previous intra-operative complications with anesthesia. He underwent a general anesthetic course here (ASA III) in 05/2016 with no documented complications.   Vitals with BMI 09/16/2019 09/09/2019 09/02/2019  Height _0  _1  _2   Weight 202 lbs 10 oz 202 lbs 13 oz 200 lbs  BMI 28.27 94.1 74.08  Systolic - 144 818  Diastolic - 82 77  Pulse - 76 89    Providers/Specialists:   NOTE: Primary physician provider listed below. Patient may have been seen by APP or partner within same practice.   PROVIDER ROLE LAST Delight Stare, MD General Surgery 09/02/2019  Crissie Figures, PA-C Primary Care Provider ?  Ida Rogue, MD Cardiology 09/09/2019   Allergies:  Peanut-containing drug products  Current Home Medications:   No current facility-administered medications for this encounter.   Marland Kitchen aspirin 81 MG tablet  . carvedilol (COREG) 12.5 MG tablet  . clopidogrel (PLAVIX) 75 MG tablet  . ezetimibe (ZETIA) 10 MG  tablet  . nitroGLYCERIN (NITROSTAT) 0.4 MG SL tablet  . pantoprazole (PROTONIX) 40 MG tablet  . tamsulosin (FLOMAX) 0.4 MG CAPS capsule  . telmisartan (MICARDIS)  80 MG tablet  . rosuvastatin (CRESTOR) 40 MG tablet   History:   Past Medical History:  Diagnosis Date  . Allergy   . Anxiety   . Asthma   . CAD, multiple vessel   . COPD (chronic obstructive pulmonary disease) ([place])   . Depression   . Diabetes mellitus without complication ([place])   . Dyspnea   . GERD (gastroesophageal reflux disease)   . History of chicken pox   . Hyperlipidemia   . Hypertension   . MI (myocardial infarction) ([place])   . Prostate enlargement   . PTSD (post-traumatic stress disorder)    [place] Vet  . Sarcoidosis of lung [facility])    Past Surgical History:  Procedure Laterality Date  . billary tube placement    . [place], [place]  . CARDIAC CATHETERIZATION     [facility]   . CHOLECYSTECTOMY N/A 06/05/2016   Procedure: LAPAROSCOPIC CHOLECYSTECTOMY with cholangiogram;  Surgeon: Jules Husbands, MD;  Location: [facility] ORS;  Service: General;  Laterality: N/A;  . [place], 05/2012   x 2  . IR GENERIC HISTORICAL  04/30/2016   IR PERC CHOLECYSTOSTOMY 04/30/2016 [facility]-INTERV RAD   Family History  Problem Relation Age of Onset  . Cancer Mother   . Hyperlipidemia Mother   . Hypertension Mother   . Cancer Father        stomach  . Hyperlipidemia Father   . Hypertension Father   . Cancer Brother        bone cancer   Social History   Tobacco Use  . Smoking status: Former Smoker    Packs/day: 4.00    Years: 25.00    Pack years: 100.00    Types: Cigarettes    Quit date: 03/24/1981    Years since quitting: 38.5  . Smokeless tobacco: Never Used  . Tobacco comment: quit 30 years asgo  Vaping Use  . Vaping Use: Never used  Substance Use Topics  . Alcohol use: No  . Drug use: No    Pertinent Clinical Results:  LABS: Labs reviewed: Acceptable for surgery.  Hospital Outpatient Visit on 09/23/2019  Component Date Value Ref Range Status  . SARS Coronavirus 2 09/23/2019 NEGATIVE  NEGATIVE Final   Comment:  (NOTE) SARS-CoV-2 target nucleic acids are NOT DETECTED.  The SARS-CoV-2 RNA is generally detectable in upper and lower respiratory specimens during the acute phase of infection. Negative results do not preclude SARS-CoV-2 infection, do not rule out co-infections with other pathogens, and should not be used as the sole basis for treatment or other patient management decisions. Negative results must be combined with clinical observations, patient history, and epidemiological information. The expected result is Negative.  Fact Sheet for Patients: SugarRoll.be  Fact Sheet for Healthcare Providers: https://www.woods-mathews.com/  This test is not yet approved or cleared by the [place] FDA and  has been authorized for detection and/or diagnosis of SARS-CoV-2 by FDA under an Emergency Use Authorization (EUA). This EUA will remain  in effect (meaning this test can be used) for the duration of the COVID-19 declaration under Se                          ction 564(b)(1) of the Act, 21 U.S.C. section  360bbb-3(b)(1), unless the authorization is terminated or revoked sooner.  Performed at [facility], [place] [address], [place], [place] [address]   . WBC 09/23/2019 7.4  4.0 - 10.5 K/uL Final  . RBC 09/23/2019 5.20  4.22 - 5.81 MIL/uL Final  . Hemoglobin 09/23/2019 14.8  13.0 - 17.0 g/dL Final  . HCT 09/23/2019 44.7  39 - 52 % Final  . MCV 09/23/2019 86.0  80.0 - 100.0 fL Final  . MCH 09/23/2019 28.5  26.0 - 34.0 pg Final  . MCHC 09/23/2019 33.1  30.0 - 36.0 g/dL Final  . RDW 09/23/2019 13.6  11.5 - 15.5 % Final  . Platelets 09/23/2019 204  150 - 400 K/uL Final  . nRBC 09/23/2019 0.0  0.0 - 0.2 % Final   Performed at [facility], [address], [place],  [address]  Hospital Outpatient Visit on 09/22/2019  Component Date Value Ref Range Status  . Rest HR 09/22/2019 67  bpm Final  . Rest BP 09/22/2019 126/66  mmHg Final   . Percent HR 09/22/2019 64  % Final  . Peak HR 09/22/2019 94  bpm Final  . Peak BP 09/22/2019 149/66  mmHg Final  . SSS 09/22/2019 2   Final  . SRS 09/22/2019 3   Final  . SDS 09/22/2019 0   Final  . TID 09/22/2019 1.16   Final  . LV sys vol 09/22/2019 32  mL Final  . LV dias vol 09/22/2019 73  62 - 150 mL Final  Office Visit on 09/09/2019  Component Date Value Ref Range Status  . Cholesterol, Total 09/09/2019 169  100 - 199 mg/dL Final  . Triglycerides 09/09/2019 194* 0 - 149 mg/dL Final  . HDL 09/09/2019 41  >39 mg/dL Final  . VLDL Cholesterol Cal 09/09/2019 33  5 - 40 mg/dL Final  . LDL Chol Calc (NIH) 09/09/2019 95  0 - 99 mg/dL Final  . Chol/HDL Ratio 09/09/2019 4.1  0.0 - 5.0 ratio Final   Comment:                                   T. Chol/HDL Ratio                                             Men  Women                               1/2 Avg.Risk  3.4    3.3                                   Avg.Risk  5.0    4.4                                2X Avg.Risk  9.6    7.1                                3X Avg.Risk 23.4   11.0   . LDL Direct 09/09/2019 101* 0 - 99 mg/dL Final  .  Glucose 09/09/2019 111* 65 - 99 mg/dL Final  . BUN 09/09/2019 22  8 - 27 mg/dL Final  . Creatinine, Ser 09/09/2019 0.93  0.76 - 1.27 mg/dL Final  . GFR calc non Af Amer 09/09/2019 81  >59 mL/min/1.73 Final  . GFR calc Af Amer 09/09/2019 93  >59 mL/min/1.73 Final   Comment: **[facility] currently reports eGFR in compliance with the current**   recommendations of the [employer]. [facility] will   update reporting as new guidelines are published from the NKF-ASN   Task force.   . BUN/Creatinine Ratio 09/09/2019 24  10 - 24 Final  . Sodium 09/09/2019 136  134 - 144 mmol/L Final  . Potassium 09/09/2019 4.4  3.5 - 5.2 mmol/L Final  . Chloride 09/09/2019 102  96 - 106 mmol/L Final  . CO2 09/09/2019 20  20 - 29 mmol/L Final  . Calcium 09/09/2019 9.3  8.6 - 10.2 mg/dL Final  . Total Protein  09/09/2019 7.1  6.0 - 8.5 g/dL Final  . Albumin 09/09/2019 4.5  3.7 - 4.7 g/dL Final  . Globulin, Total 09/09/2019 2.6  1.5 - 4.5 g/dL Final  . Albumin/Globulin Ratio 09/09/2019 1.7  1.2 - 2.2 Final  . Bilirubin Total 09/09/2019 0.4  0.0 - 1.2 mg/dL Final  . Alkaline Phosphatase 09/09/2019 88  48 - 121 IU/L Final  . AST 09/09/2019 27  0 - 40 IU/L Final  . ALT 09/09/2019 36  0 - 44 IU/L Final     ECG: Date: 09/09/2019 Time ECG obtained: 1230 PM Rate: 70 bpm Rhythm: normal sinus; RBBB + FPFB + LBBB = trifascicular block Intervals: PR 174 ms. QTc 462 ms. ST segment and T wave changes: No evidence of acute ST segment elevation or depression Comparison: Similar to previous tracing obtained on 03/22/2019.   IMAGING / PROC/EDURES: LEXISCAN done on 09/22/2019 1. Pharmacological myocardial perfusion imaging study with mild to moderate ischemia in the inferior wall. Unable to exclude diaphragmatic atenuation artifact 2. Normal wall motion. 3. EF estimated at 63% 4. No EKG changes concerning for ischemia at peak stress or in recovery. 5. CT attenuation correction images were unavailable 6. Moderate risk scan 7. Patient notes indicating previously noted predominantly fixed perfusion defect in the inferior wall on stress imaging 2015. Clinical correlation recommended.  Could consider alternate form of imaging such as cardiac CTA versus catheterization if symptoms indicate.  ECHOCARDIOGRAM done on 04/28/2016 1. Left ventricle:   The cavity size was at the upper limits of normal. Wall thickness was at the upper limits of normal.  Systolic function was normal.   The estimated ejection fraction was in the range of 55% to 60%.   Wall motion was normal; there were no regional wall motion abnormalities.   Doppler parameters are consistent with abnormal left ventricular relaxation (grade 1diastolic dysfunction).  2. Left atrium:   The atrium was mildly dilated.  3. Right ventricle:   The  cavity size was normal.   Systolic function was normal.    Impression and Plan:  Ricky Abbott has been referred for pre-anesthesia review and clearance prior him undergoing the planned anesthetic and procedural courses. Available labs, pertinent testing, and imaging results were personally reviewed by me. Recent Lexiscan determined to be moderate risk. Patient pending TTE and CXR. Per cardiology provider, if Ricky Abbott was low risk, the planned procedures could proceed as planned, however this is not the case. In the best interest of the patient undergoing an elective plans are to postpone  procedure pending TTE and proper clearance from the cardiology service line. Surgeon's office has been made aware. Patient has been contacted by surgeon's office and advised to restart his anticoagulation therapy.   Honor Loh, MSN, APRN, FNP-C, CEN [facility]  Peri-operative Services Nurse Practitioner Phone: [phone] 09/23/19 8:32 AM  NOTE: This note has been prepared using Dragon dictation software. Despite my best ability to proofread, there is always the potential that unintentional transcriptional errors may still occur from this process.

## 2019-09-26 ENCOUNTER — Other Ambulatory Visit: Payer: Self-pay

## 2019-09-26 ENCOUNTER — Encounter: Payer: Self-pay | Admitting: Gastroenterology

## 2019-09-26 ENCOUNTER — Telehealth: Payer: Self-pay

## 2019-09-26 ENCOUNTER — Telehealth: Payer: Self-pay | Admitting: Cardiovascular Disease

## 2019-09-26 DIAGNOSIS — Z1211 Encounter for screening for malignant neoplasm of colon: Secondary | ICD-10-CM

## 2019-09-26 NOTE — Telephone Encounter (Signed)
Provider called back and stated patient that he has been off the Plavix since 8/10 and he has been continuing the aspirin (did not take today).

## 2019-09-26 NOTE — Telephone Encounter (Signed)
[facility] calling to check on status of clearance Surgery is tomorrow

## 2019-09-26 NOTE — Telephone Encounter (Signed)
° °  [facility] Pre-operative Risk Assessment    HEARTCARE STAFF: - Please ensure there is not already an duplicate clearance open for this procedure. - Under Visit Info/Reason for Call, type in Other and utilize the format Clearance MM/DD/YY or Clearance TBD. Do not use dashes or single digits. - If request is for dental extraction, please clarify the # of teeth to be extracted.  Request for surgical clearance:  1. What type of surgery is being performed? Robot assisted umbilical hernia repair plication of discitis recti   2. When is this surgery scheduled? 09/27/19  3. What type of clearance is required (medical clearance vs. Pharmacy clearance to hold med vs. Both)? medical  4. Are there any medications that need to be held prior to surgery and how long? Advise on Plavix and aspirin (wanted medication held 1 week prior, unknown if patient has been holding medication at this time)  5. Practice name and name of physician performing surgery? [facility] Dr. Jacqulyn Bath [place]  6. What is the office phone number? [phone]   7.   What is the office fax number? [phone]  8.   Anesthesia type (None, local, MAC, general) ? general   Ricky Abbott 09/26/2019, 11:43 AM  _________________________________________________________________   (provider comments below)

## 2019-09-26 NOTE — Telephone Encounter (Signed)
Called pt and informed pt that he should re-start his Plavix/ASA until he is cleared for surgery and then stop again when cleared after his ECHO 8-27. Pt became loud and saying multiple other complaints about "healthcare system" and was continuing to be loud and disrespectful and had to discontinue this phone call. Per Mickle Plumb, NP per office note 09-09-2019: "Obtain echo, Lexiscan.  Consider also CXR.Marland KitchenReasonable to proceed with colonoscopy and umbilical hernia surgery if Lexiscan performed and ruled low risk & echo without acute structural changes or reduced EF."

## 2019-09-26 NOTE — Telephone Encounter (Signed)
I spoke with the patient to verify that he has been off of his Plavix and Aspirin prior to surgery. He states that he was never told about the Aspirin. He has been off of his Plavix though. I did send a message to Dr Hampton Abbot about this and notified [place].

## 2019-09-26 NOTE — Telephone Encounter (Signed)
Pt is having ECHO on 8-27 and will need to be re-scheduled.  Called [facility] notified Barbera/Scheduler. She will call and reschedule.   Called and notified to restart Plavix and ASA until pt is cleared for procedure. (see other telephone note for further details)

## 2019-09-26 NOTE — Telephone Encounter (Signed)
Deberah Pelton, NP 09/26/19 12:25 PM Note Ricky Abbott is not cleared for surgery at this time. During his 09/09/2019 cardiac clinic visit it was recommended that he undergo nuclear stress testing and echocardiogram prior to cardiac clearance. He has not yet completed these studies. We cannot provide cardiac clearance until we can fully assess his cardiac risk.  Ricky Ng. Cleaver NP-C

## 2019-09-26 NOTE — Telephone Encounter (Signed)
Spoke with Dr Hampton Abbot and he said it was fine for the patient to continue his Aspirin. Heart care had told him to continue it.

## 2019-09-26 NOTE — Telephone Encounter (Signed)
Mr. Dials is not cleared for surgery at this time. During his 09/09/2019 cardiac clinic visit it was recommended that he undergo nuclear stress testing and echocardiogram prior to cardiac clearance. He has not yet completed these studies. We cannot provide cardiac clearance until we can fully assess his cardiac risk.  Jossie Ng. Kairos Panetta NP-C    09/26/2019, 12:25 PM [place] [address] Office [phone] Fax [phone]

## 2019-09-27 ENCOUNTER — Encounter: Admission: RE | Payer: Self-pay | Source: Ambulatory Visit

## 2019-09-27 ENCOUNTER — Telehealth: Payer: Self-pay

## 2019-09-27 SURGERY — COLONOSCOPY WITH PROPOFOL
Anesthesia: General

## 2019-09-27 NOTE — Telephone Encounter (Signed)
Spoke with the patient. Adv the patient that Dr. Rockey Situ would availability to do the cardiac tomorrow 8/18 or Thurs 8/19.  Patient sts that he will need to secure a ride. He will call back to schedule.

## 2019-09-27 NOTE — Telephone Encounter (Signed)
Update: Based on stress test results, and after discussion with patient, he  is scheduled for cardiac catheterization next week 8/26 with updated clearance information to be provided for procedures following cath.

## 2019-09-27 NOTE — Telephone Encounter (Signed)
Patient sts that he would like to have his cardiac cath scheduled for next week. Patient sts that his cousin will be able to pick him up and stay with him for 24 hours after the procedure.+  Patient adv he will need a repeat COVID test on 10/04/19. Patient adv to report to the medical arts drive up test between 8am-1pm.  Patient had recent cmet and cbc.  Patients LH C scheduled for 10/06/19 @ 8:30 am with Dr. Rockey Situ. Patient given verbal pre-procedure instructions.  Patient verbalized understanding to the instructions given. He has no question or concerns at this time.   [facility] Cardiac Cath Instructions   You are scheduled for a Cardiac Cath on: 10/06/19  Please arrive at 7:30 am on the day of your procedure  Please expect a call from our [facility] to pre-register you  Do not eat/drink anything after midnight  Someone will need to drive you home  It is recommended someone be with you for the first 24 hours after your procedure  Wear clothes that are easy to get on/off and wear slip on shoes if possible   Medications bring a current list of all medications with you  _X__ You may take all of your medications the morning of your procedure with enough water to swallow safely   Day of your procedure: Arrive at the [place] entrance.  Free valet service is available.  After entering the [facility] please check-in at the registration desk (1st desk on your right) to receive your armband. After receiving your armband someone will escort you to the cardiac cath/special procedures waiting area.  The usual length of stay after your procedure is about 2 to 3 hours.  This can vary.  If you have any questions, please call our office at [phone], or you may call the cardiac cath lab at [facility] directly at [phone]

## 2019-09-27 NOTE — Telephone Encounter (Signed)
-----   Message from Arvil Chaco, PA-C sent at 09/26/2019  8:20 PM EDT ----- Results discussed in detail with patient during phone call. Plan for cardiac cath. Please call and schedule for [facility]. He is hopeful to have this scheduled as soon as possible, given he is pending preop clearance and also to avoid the need for repeat blood work. Please let me know of the date scheduled with Dr. Rockey Situ. We will hold off on obtaining his echo and LE studies for now, as cath should take precedence.

## 2019-09-27 NOTE — Telephone Encounter (Signed)
Noted. Thank you for fitting him in so quickly!

## 2019-09-29 ENCOUNTER — Other Ambulatory Visit: Payer: Self-pay | Admitting: Physician Assistant

## 2019-09-29 MED ORDER — SODIUM CHLORIDE 0.9% FLUSH
3.0000 mL | Freq: Two times a day (BID) | INTRAVENOUS | Status: AC
  Filled 2019-09-29: qty 3

## 2019-10-03 ENCOUNTER — Telehealth: Payer: Self-pay | Admitting: Physician Assistant

## 2019-10-03 NOTE — Telephone Encounter (Signed)
Please call to discuss if patient needs his echocardiogram before his cath.

## 2019-10-03 NOTE — Telephone Encounter (Signed)
Routing to [employer] to advise.

## 2019-10-03 NOTE — Telephone Encounter (Signed)
I feel it is reasonable for [place] to wait on the echo.   After his cath and depending on its findings (and his symptoms) we can make a decision regarding the echo at follow-up.  Will CC Dr. Rockey Situ on this result note.Marland Kitchen

## 2019-10-03 NOTE — Telephone Encounter (Signed)
Spoke with patient and he verbalized understanding. He is simply wanting to make sure all testing is necessary in order to help himself financially. I expressed my understanding and he was appreciative.

## 2019-10-04 ENCOUNTER — Other Ambulatory Visit
Admission: RE | Admit: 2019-10-04 | Discharge: 2019-10-04 | Disposition: A | Discharge status: Home / Self Care | Payer: Medicare Other | Source: Ambulatory Visit | Attending: Physician Assistant | Admitting: Physician Assistant

## 2019-10-04 ENCOUNTER — Telehealth: Payer: Self-pay | Admitting: Surgery

## 2019-10-04 ENCOUNTER — Other Ambulatory Visit: Payer: Self-pay

## 2019-10-04 DIAGNOSIS — Z20822 Contact with and (suspected) exposure to covid-19: Secondary | ICD-10-CM | POA: Insufficient documentation

## 2019-10-04 DIAGNOSIS — Z01812 Encounter for preprocedural laboratory examination: Secondary | ICD-10-CM | POA: Diagnosis present

## 2019-10-04 LAB — SARS CORONAVIRUS 2 (TAT 6-24 HRS): SARS Coronavirus 2: NEGATIVE

## 2019-10-04 NOTE — Telephone Encounter (Signed)
Per Caryllyn our office will re-fax Cardiac Clearance closer to patient's appointment with Cardiologist on 10/07/19 at scheduled Echo appointment.

## 2019-10-04 NOTE — Telephone Encounter (Signed)
Patient has been rescheduled for his surgery to 11/01/19.  Cardiology would not clear him initially.  Per Sharyn Lull with [facility] patient needs an echo done, he is scheduled for this on 10/07/19.    Will need cardiac clearance prior to surgery.   Thank you.

## 2019-10-05 ENCOUNTER — Telehealth: Payer: Self-pay | Admitting: Surgery

## 2019-10-05 NOTE — Telephone Encounter (Signed)
Updated information regarding rescheduled date for surgery.  Patient has been advised of Pre-Admission date/time, COVID Testing date and Surgery date.  Surgery Date: 11/01/19 Preadmission Testing Date: 09/23/19 (done, cardiology cath and echo pending per patient, awaiting cardiology clearance) Covid Testing Date: 10/28/19 - patient advised to go to the [place] [facility] between 8a-1p  Patient has been made aware to call [phone], between 1-3:00pm the day before surgery, to find out what time to arrive for surgery.

## 2019-10-06 ENCOUNTER — Encounter: Payer: Self-pay | Admitting: Cardiovascular Disease

## 2019-10-06 ENCOUNTER — Other Ambulatory Visit: Payer: Self-pay | Admitting: Cardiovascular Disease

## 2019-10-06 ENCOUNTER — Other Ambulatory Visit: Payer: Self-pay

## 2019-10-06 ENCOUNTER — Ambulatory Visit
Admission: RE | Admit: 2019-10-06 | Discharge: 2019-10-06 | Disposition: A | Discharge status: Home / Self Care | Payer: Medicare Other | Attending: Cardiovascular Disease | Admitting: Cardiovascular Disease

## 2019-10-06 ENCOUNTER — Encounter
Admission: RE | Disposition: A | Discharge status: Home / Self Care | Payer: Medicare Other | Source: Home / Self Care | Attending: Cardiovascular Disease

## 2019-10-06 DIAGNOSIS — D860 Sarcoidosis of lung: Secondary | ICD-10-CM | POA: Insufficient documentation

## 2019-10-06 DIAGNOSIS — E785 Hyperlipidemia, unspecified: Secondary | ICD-10-CM | POA: Diagnosis not present

## 2019-10-06 DIAGNOSIS — I25110 Atherosclerotic heart disease of native coronary artery with unstable angina pectoris: Secondary | ICD-10-CM

## 2019-10-06 DIAGNOSIS — K219 Gastro-esophageal reflux disease without esophagitis: Secondary | ICD-10-CM | POA: Insufficient documentation

## 2019-10-06 DIAGNOSIS — I200 Unstable angina: Secondary | ICD-10-CM

## 2019-10-06 DIAGNOSIS — F4310 Post-traumatic stress disorder, unspecified: Secondary | ICD-10-CM | POA: Diagnosis not present

## 2019-10-06 DIAGNOSIS — Z79899 Other long term (current) drug therapy: Secondary | ICD-10-CM | POA: Insufficient documentation

## 2019-10-06 DIAGNOSIS — I10 Essential (primary) hypertension: Secondary | ICD-10-CM | POA: Diagnosis not present

## 2019-10-06 DIAGNOSIS — I252 Old myocardial infarction: Secondary | ICD-10-CM | POA: Insufficient documentation

## 2019-10-06 DIAGNOSIS — J449 Chronic obstructive pulmonary disease, unspecified: Secondary | ICD-10-CM | POA: Diagnosis not present

## 2019-10-06 DIAGNOSIS — Z87891 Personal history of nicotine dependence: Secondary | ICD-10-CM | POA: Diagnosis not present

## 2019-10-06 DIAGNOSIS — E1142 Type 2 diabetes mellitus with diabetic polyneuropathy: Secondary | ICD-10-CM | POA: Diagnosis present

## 2019-10-06 DIAGNOSIS — Z955 Presence of coronary angioplasty implant and graft: Secondary | ICD-10-CM

## 2019-10-06 DIAGNOSIS — Z7902 Long term (current) use of antithrombotics/antiplatelets: Secondary | ICD-10-CM | POA: Insufficient documentation

## 2019-10-06 DIAGNOSIS — F329 Major depressive disorder, single episode, unspecified: Secondary | ICD-10-CM | POA: Insufficient documentation

## 2019-10-06 DIAGNOSIS — F419 Anxiety disorder, unspecified: Secondary | ICD-10-CM | POA: Insufficient documentation

## 2019-10-06 DIAGNOSIS — I447 Left bundle-branch block, unspecified: Secondary | ICD-10-CM | POA: Insufficient documentation

## 2019-10-06 DIAGNOSIS — I4510 Unspecified right bundle-branch block: Secondary | ICD-10-CM | POA: Insufficient documentation

## 2019-10-06 DIAGNOSIS — E1165 Type 2 diabetes mellitus with hyperglycemia: Secondary | ICD-10-CM | POA: Diagnosis present

## 2019-10-06 DIAGNOSIS — K432 Incisional hernia without obstruction or gangrene: Secondary | ICD-10-CM

## 2019-10-06 DIAGNOSIS — Z7982 Long term (current) use of aspirin: Secondary | ICD-10-CM | POA: Insufficient documentation

## 2019-10-06 DIAGNOSIS — M6208 Separation of muscle (nontraumatic), other site: Secondary | ICD-10-CM

## 2019-10-06 DIAGNOSIS — E1151 Type 2 diabetes mellitus with diabetic peripheral angiopathy without gangrene: Secondary | ICD-10-CM | POA: Diagnosis not present

## 2019-10-06 DIAGNOSIS — I2510 Atherosclerotic heart disease of native coronary artery without angina pectoris: Secondary | ICD-10-CM | POA: Diagnosis present

## 2019-10-06 DIAGNOSIS — R9439 Abnormal result of other cardiovascular function study: Secondary | ICD-10-CM

## 2019-10-06 HISTORY — PX: LEFT HEART CATH AND CORONARY ANGIOGRAPHY: CATH118249

## 2019-10-06 LAB — GLUCOSE, CAPILLARY
Glucose-Capillary: 124 mg/dL — ABNORMAL HIGH (ref 70–99)
Glucose-Capillary: 107 mg/dL — ABNORMAL HIGH (ref 70–99)

## 2019-10-06 SURGERY — LEFT HEART CATH AND CORONARY ANGIOGRAPHY
Anesthesia: Moderate Sedation | Laterality: Left

## 2019-10-06 MED ORDER — ACETAMINOPHEN 325 MG PO TABS: 650.0000 mg | ORAL_TABLET | ORAL | Status: DC | PRN

## 2019-10-06 MED ORDER — HEPARIN (PORCINE) IN NACL 1000-0.9 UT/500ML-% IV SOLN
INTRAVENOUS | Status: DC | PRN
  Administered 2019-10-06: 500 mL

## 2019-10-06 MED ORDER — SODIUM CHLORIDE 0.9 % WEIGHT BASED INFUSION: 1.0000 mL/kg/h | INTRAVENOUS | Status: DC

## 2019-10-06 MED ORDER — SODIUM CHLORIDE 0.9% FLUSH: 3.0000 mL | Freq: Two times a day (BID) | INTRAVENOUS | Status: DC

## 2019-10-06 MED ORDER — MIDAZOLAM HCL 2 MG/2ML IJ SOLN
INTRAMUSCULAR | Status: DC | PRN
  Administered 2019-10-06: 1 mg via INTRAVENOUS

## 2019-10-06 MED ORDER — LIDOCAINE HCL (PF) 1 % IJ SOLN
INTRAMUSCULAR | Status: DC | PRN
  Administered 2019-10-06: 20 mL

## 2019-10-06 MED ORDER — LABETALOL HCL 5 MG/ML IV SOLN: 10.0000 mg | INTRAVENOUS | Status: DC | PRN

## 2019-10-06 MED ORDER — HYDRALAZINE HCL 20 MG/ML IJ SOLN: 10.0000 mg | INTRAMUSCULAR | Status: DC | PRN

## 2019-10-06 MED ORDER — SODIUM CHLORIDE 0.9 % IV SOLN: 250.0000 mL | INTRAVENOUS | Status: DC | PRN

## 2019-10-06 MED ORDER — IOHEXOL 300 MG/ML  SOLN
INTRAMUSCULAR | Status: DC | PRN
  Administered 2019-10-06: 100 mL

## 2019-10-06 MED ORDER — ONDANSETRON HCL 4 MG/2ML IJ SOLN: 4.0000 mg | Freq: Four times a day (QID) | INTRAMUSCULAR | Status: DC | PRN

## 2019-10-06 MED ORDER — SODIUM CHLORIDE 0.9 % WEIGHT BASED INFUSION
3.0000 mL/kg/h | INTRAVENOUS | Status: AC
  Administered 2019-10-06: 3 mL/kg/h via INTRAVENOUS

## 2019-10-06 MED ORDER — FENTANYL CITRATE (PF) 100 MCG/2ML IJ SOLN
INTRAMUSCULAR | Status: DC | PRN
Start: 2019-10-06 — End: 2019-10-06
  Administered 2019-10-06: 25 ug via INTRAVENOUS

## 2019-10-06 MED ORDER — SODIUM CHLORIDE 0.9% FLUSH: 3.0000 mL | INTRAVENOUS | Status: DC | PRN

## 2019-10-06 MED ORDER — FENTANYL CITRATE (PF) 100 MCG/2ML IJ SOLN
INTRAMUSCULAR | Status: AC
  Filled 2019-10-06: qty 2

## 2019-10-06 MED ORDER — ISOSORBIDE MONONITRATE ER 30 MG PO TB24: 30.0000 mg | ORAL_TABLET | Freq: Every day | ORAL | 3 refills | Status: DC

## 2019-10-06 MED ORDER — ASPIRIN 81 MG PO CHEW: 81.0000 mg | CHEWABLE_TABLET | ORAL | Status: DC

## 2019-10-06 MED ORDER — MIDAZOLAM HCL 2 MG/2ML IJ SOLN
INTRAMUSCULAR | Status: AC
  Filled 2019-10-06: qty 2

## 2019-10-06 MED ORDER — LIDOCAINE HCL (PF) 1 % IJ SOLN
INTRAMUSCULAR | Status: AC
  Filled 2019-10-06: qty 30

## 2019-10-06 MED ORDER — CEFAZOLIN SODIUM-DEXTROSE 2-4 GM/100ML-% IV SOLN: 2.0000 g | INTRAVENOUS | Status: DC

## 2019-10-06 SURGICAL SUPPLY — 10 items
CATH INFINITI 5FR ANG PIGTAIL (CATHETERS) ×2 IMPLANT
CATH INFINITI 5FR JL4 (CATHETERS) ×2 IMPLANT
CATH INFINITI JR4 5F (CATHETERS) ×2 IMPLANT
DEVICE CLOSURE MYNXGRIP 5F (Vascular Products) ×2 IMPLANT
KIT MANI 3VAL PERCEP (MISCELLANEOUS) ×2 IMPLANT
NEEDLE PERC 18GX7CM (NEEDLE) ×2 IMPLANT
PACK CARDIAC CATH (CUSTOM PROCEDURE TRAY) ×2 IMPLANT
SHEATH AVANTI 5FR X 11CM (SHEATH) ×2 IMPLANT
WIRE GUIDERIGHT .035X150 (WIRE) ×2 IMPLANT
WIRE HITORQ VERSACORE ST 145CM (WIRE) ×2 IMPLANT

## 2019-10-06 NOTE — Discharge Instructions (Signed)
Angiogram, Care After This sheet gives you information about how to care for yourself after your procedure. Your health care provider may also give you more specific instructions. If you have problems or questions, contact your health care provider. What can I expect after the procedure? After the procedure, it is common to have bruising and tenderness at the catheter insertion area. Follow these instructions at home: Insertion site care  Follow instructions from your health care provider about how to take care of your insertion site. Make sure you: ? Wash your hands with soap and water before you change your bandage (dressing). If soap and water are not available, use hand sanitizer. ? Change your dressing as told by your health care provider. ? Leave stitches (sutures), skin glue, or adhesive strips in place. These skin closures may need to stay in place for 2 weeks or longer. If adhesive strip edges start to loosen and curl up, you may trim the loose edges. Do not remove adhesive strips completely unless your health care provider tells you to do that.  Do not take baths, swim, or use a hot tub until your health care provider approves.  You may shower 24-48 hours after the procedure or as told by your health care provider. ? Gently wash the site with plain soap and water. ? Pat the area dry with a clean towel. ? Do not rub the site. This may cause bleeding.  Do not apply powder or lotion to the site. Keep the site clean and dry.  Check your insertion site every day for signs of infection. Check for: ? Redness, swelling, or pain. ? Fluid or blood. ? Warmth. ? Pus or a bad smell. Activity  Rest as told by your health care provider, usually for 1-2 days.  Do not lift anything that is heavier than 10 lbs. (4.5 kg) or as told by your health care provider.  Do not drive for 24 hours if you were given a medicine to help you relax (sedative).  Do not drive or use heavy machinery while  taking prescription pain medicine. General instructions   Return to your normal activities as told by your health care provider, usually in about a week. Ask your health care provider what activities are safe for you.  If the catheter site starts bleeding, lie flat and put pressure on the site. If the bleeding does not stop, get help right away. This is a medical emergency.  Drink enough fluid to keep your urine clear or pale yellow. This helps flush the contrast dye from your body.  Take over-the-counter and prescription medicines only as told by your health care provider.  Keep all follow-up visits as told by your health care provider. This is important. Contact a health care provider if:  You have a fever or chills.  You have redness, swelling, or pain around your insertion site.  You have fluid or blood coming from your insertion site.  The insertion site feels warm to the touch.  You have pus or a bad smell coming from your insertion site.  You have bruising around the insertion site.  You notice blood collecting in the tissue around the catheter site (hematoma). The hematoma may be painful to the touch. Get help right away if:  You have severe pain at the catheter insertion area.  The catheter insertion area swells very fast.  The catheter insertion area is bleeding, and the bleeding does not stop when you hold steady pressure on the area.    The area near or just beyond the catheter insertion site becomes pale, cool, tingly, or numb. These symptoms may represent a serious problem that is an emergency. Do not wait to see if the symptoms will go away. Get medical help right away. Call your local emergency services (911 in the [place] Do not drive yourself to the hospital. Summary  After the procedure, it is common to have bruising and tenderness at the catheter insertion area.  After the procedure, it is important to rest and drink plenty of fluids.  Do not take baths,  swim, or use a hot tub until your health care provider says it is okay to do so. You may shower 24-48 hours after the procedure or as told by your health care provider.  If the catheter site starts bleeding, lie flat and put pressure on the site. If the bleeding does not stop, get help right away. This is a medical emergency. This information is not intended to replace advice given to you by your health care provider. Make sure you discuss any questions you have with your health care provider. Document Revised: 01/09/2017 Document Reviewed: 01/02/2016 Elsevier Patient Education  2020 Elsevier Inc.  

## 2019-10-07 ENCOUNTER — Ambulatory Visit (INDEPENDENT_AMBULATORY_CARE_PROVIDER_SITE_OTHER): Payer: Medicare Other

## 2019-10-07 DIAGNOSIS — R0602 Shortness of breath: Secondary | ICD-10-CM

## 2019-10-07 DIAGNOSIS — I739 Peripheral vascular disease, unspecified: Secondary | ICD-10-CM | POA: Diagnosis not present

## 2019-10-07 MED ORDER — PERFLUTREN LIPID MICROSPHERE
1.0000 mL | INTRAVENOUS | Status: AC | PRN
  Administered 2019-10-07: 2 mL via INTRAVENOUS

## 2019-10-08 LAB — ECHOCARDIOGRAM COMPLETE
AR max vel: 2.64 cm2
AV Area VTI: 2.31 cm2
AV Area mean vel: 2.4 cm2
AV Mean grad: 4 mmHg
AV Peak grad: 8.4 mmHg
Ao pk vel: 1.45 m/s
Area-P 1/2: 3.11 cm2
Calc EF: 53.7 %
S' Lateral: 2.9 cm
Single Plane A2C EF: 50.1 %
Single Plane A4C EF: 51.9 %

## 2019-10-09 NOTE — Interval H&P Note (Signed)
History and Physical Interval Note:  10/09/2019 3:17 PM  Ricky Abbott  has presented today for surgery, with the diagnosis of LT Cath   Abnormal stress test, CAD, prior PCI to proximal LAD, unstable angina.  The various methods of treatment have been discussed with the patient and family. After consideration of risks, benefits and other options for treatment, the patient has consented to  Procedure(s): LEFT HEART CATH AND CORONARY ANGIOGRAPHY (Left) as a surgical intervention.  The patient's history has been reviewed, patient examined, no change in status, stable for surgery.  I have reviewed the patient's chart and labs.  Questions were answered to the patient's satisfaction.     Ricky Abbott

## 2019-10-09 NOTE — H&P (Signed)
H&P Addendum, precardiac catheterization  Patient was seen and evaluated prior to Cardiac catheterization procedure Symptoms, prior testing details again confirmed with the patient Patient examined, no significant change from prior exam Lab work reviewed in detail personally by myself Patient understands risk and benefit of the procedure, willing to proceed  Signed, Tim Anetta Olvera, MD, Ph.D [facility]    

## 2019-10-11 ENCOUNTER — Telehealth: Payer: Self-pay | Admitting: Cardiovascular Disease

## 2019-10-11 ENCOUNTER — Telehealth: Payer: Self-pay

## 2019-10-11 NOTE — Telephone Encounter (Signed)
   [facility] Pre-operative Risk Assessment    HEARTCARE STAFF: - Please ensure there is not already an duplicate clearance open for this procedure. - Under Visit Info/Reason for Call, type in Other and utilize the format Clearance MM/DD/YY or Clearance TBD. Do not use dashes or single digits. - If request is for dental extraction, please clarify the # of teeth to be extracted.  Request for surgical clearance:  1. What type of surgery is being performed? Robot assist incisional hernia repair  2. When is this surgery scheduled? 11/01/19  3. What type of clearance is required (medical clearance vs. Pharmacy clearance to hold med vs. Both)? both  4. Are there any medications that need to be held prior to surgery and how long? Not listed, please advise if needed  5. Practice name and name of physician performing surgery? [facility] - Dr Jacqulyn Bath Piscoya  6. What is the office phone number? [phone]   7.   What is the office fax number? [phone]  8.   Anesthesia type (None, local, MAC, general) ? general   Ace Gins 10/11/2019, 3:27 PM  _________________________________________________________________   (provider comments below)

## 2019-10-11 NOTE — Telephone Encounter (Signed)
Spoke with patient to give echo results. Patient stated that he has a lot of pain at the groin site from his cath lab procedure last Thurs. 8/27. He described it as a greater than 1 inch hardened area, with pain down the leg that is making it hard for him to lie flat and it is extremely black and blue.   I rescheduled his follow up with Laurann Montana tomorrow morning, and cancelled the apt with Elenor Quinones on 9/13.  Patient was grateful for the follow up call and appointment.

## 2019-10-11 NOTE — Telephone Encounter (Signed)
   Primary Cardiologist: Ida Rogue, MD  Chart reviewed as part of pre-operative protocol coverage. Patient was last seen by Marrianne Mood, PA-C. H/o outlined with CAD s/p prior PCIs, PTSD, ETOH, depression, hepatitis, insurance and financial issues, hypertension, hyperlipidemia, COPD, sarcoidosis of the lung. At recent OV originally for pre-op clearance, he reported DOE as well as claudication. Echo showed normal LV function, mild MR. Underwent screening nuclear stress test which was abnormal on 09/23/19. Had subsequent cath on 10/06/19 by Dr. Rockey Situ - see report - he discussed with interventional colleagues and decision was made to pursue medical therapy.  Will route this note to Dr. Rockey Situ (and cc Jacquelyn as [name]) for final input on clearance for hernia repair as requested below. It is not specified about holding blood thinners but previous clearance listed both ASA + Plavix so would appreciate input from Dr. Rockey Situ on this as well. Dr. Rockey Situ - Please route response to P CV DIV PREOP (the pre-op pool). Thank you.  Charlie Pitter, PA-C 10/11/2019, 3:59 PM

## 2019-10-11 NOTE — Progress Notes (Addendum)
Office Visit    Patient Name: Ricky Abbott Date of Encounter: 10/12/2019  Primary Care Provider:  Crissie Figures, PA-C Primary Cardiologist:  Ida Rogue, MD Electrophysiologist:  None   Chief Complaint    Ricky Abbott is a 74 y.o. male with a hx of CAD (s/p stent 20 year ago in [place], [place] 2014 s/p stent at [facility]), HLD, PTSD, etoh use, depression, DM2, hepatitis, HTN, COPD, sarcoidosis of lung presents today for follow up after cardiac catheterization   Past Medical History    Past Medical History:  Diagnosis Date  . Allergy   . Anxiety   . Asthma   . CAD, multiple vessel   . COPD (chronic obstructive pulmonary disease) ([place])   . Depression   . Diabetes mellitus without complication ([place])   . Dyspnea   . GERD (gastroesophageal reflux disease)   . History of chicken pox   . Hyperlipidemia   . Hypertension   . MI (myocardial infarction) ([place])   . Prostate enlargement   . PTSD (post-traumatic stress disorder)    [place] [employer]  . Sarcoidosis of lung [facility])    Past Surgical History:  Procedure Laterality Date  . billary tube placement    . [place], [place]  . CARDIAC CATHETERIZATION     [facility]   . CHOLECYSTECTOMY N/A 06/05/2016   Procedure: LAPAROSCOPIC CHOLECYSTECTOMY with cholangiogram;  Surgeon: Jules Husbands, MD;  Location: [facility] ORS;  Service: General;  Laterality: N/A;  . [employer], 05/2012   x 2  . IR GENERIC HISTORICAL  04/30/2016   IR PERC CHOLECYSTOSTOMY 04/30/2016 [facility]-INTERV RAD  . LEFT HEART CATH AND CORONARY ANGIOGRAPHY Left 10/06/2019   Procedure: LEFT HEART CATH AND CORONARY ANGIOGRAPHY;  Surgeon: Minna Merritts, MD;  Location: [facility];  Service: Cardiovascular;  Laterality: Left;    Allergies  Allergies  Allergen Reactions  . Peanut-Containing Drug Products Swelling    Swelling, rash, (Peanuts)    History of Present Illness    Ricky Abbott is a 74 y.o. male  with a hx of CAD (s/p stent 20 year ago in [place], [place] 2014 s/p stent at [facility]), HLD, PTSD, etoh use, depression, DM2, hepatitis, HTN, COPD, sarcoidosis of lung. He was last seen for cardiac catheterization.  Seen 09/09/19 for clearance for colonoscopy and umbilical hernia repair. He noted SOB and DOE along with LE claudication symptoms.  He underwent Lexiscan Myoview 09/22/2019 which was moderate risk scan with mild to moderate ischemia of the inferior wall.  He was recommended for cardiac catheterization which was performed 10/06/2019 by Dr. Rockey Situ with ostial-proximal LAD 70% with in-stent restenosis, ostial-mid LM 60%, proximal/mid LAD 60%, first marginal 70%, EF 55 to 65%, normal LVEDP.  Due to mild symptoms he was recommended to pursue medical management.  For worsening anginal symptoms would need to consider CABG with LIMA to LAD.  Imdur was added for blood pressure control.  ABI perform 10/07/19 were stable compared to previous, recommended for repeat study in 1 year, no significant PAD noted. Echo 10/07/19 EF 60-65%, no RWMA, gr1DD, RV normal size/function, mild MR.  Presents today for significant soreness at the right groin catheterization site.  Denies bleeding, exhibit.  Tells me he has difficulty laying on the side for her sitting in the bed as it is sore.  Reports no chest pain, pressure, tightness.  Reports no headache with the addition of Imdur.  Reports no  worsening dyspnea on exertion.  Shares with me that his hernia surgery is likely to be rescheduled due to cancellation of nonelective procedures and he is hopeful to be able to delay his colonoscopy as well to have them done at the same time  EKGs/Labs/Other Studies Reviewed:   The following studies were reviewed today:  Cardiac cath 10/06/19 Ost LAD to Prox LAD lesion is 70% stenosed. Ost LM to Mid LM lesion is 60% stenosed. Prox LAD to Mid LAD lesion is 60% stenosed. 1st Mrg lesion is 70% stenosed. The left ventricular systolic  function is normal. LV end diastolic pressure is normal. The left ventricular ejection fraction is 55-65% by visual estimate. There is no aortic valve stenosis. Echo 04/2016 - Left ventricle: The cavity size was at the upper limits of    normal. Wall thickness was at the upper limits of normal.    Systolic function was normal. The estimated ejection fraction was    in the range of 55% to 60%. Wall motion was normal; there were no    regional wall motion abnormalities. Doppler parameters are    consistent with abnormal left ventricular relaxation (grade 1    diastolic dysfunction).  - Left atrium: The atrium was mildly dilated.  - Right ventricle: The cavity size was normal. Systolic function    was normal.    Arterial study 2018 Widely patent arteries above the right knee and throughout the left side. 50-74% stenosis of the right tibio-peroneal trunk. 30-49% stenosis of the right proximal anterior tibial artery. Three-vessel run-off. bilaterally. See arterial Doppler study of the same date. PV consult recommended.    Carotid  2018 Heterogeneous plaque, bilaterally. 1-39% bilateral ICA stenosis. Patent vertebral arteries with antegrade flow. Normal subclavian arteries, bilaterally.   EKG:  No EKG today. Patient politely declines.   Recent Labs: 09/09/2019: ALT 36; BUN 22; Creatinine, Ser 0.93; Potassium 4.4; Sodium 136 09/23/2019: Hemoglobin 14.8; Platelets 204  Recent Lipid Panel    Component Value Date/Time   CHOL 169 09/09/2019 1356   TRIG 194 (H) 09/09/2019 1356   HDL 41 09/09/2019 1356   CHOLHDL 4.1 09/09/2019 1356   CHOLHDL 5 10/16/2016 0842   VLDL 20.2 10/16/2016 0842   LDLCALC 95 09/09/2019 1356   LDLDIRECT 101 (H) 09/09/2019 1356   LDLDIRECT 117.0 04/18/2016 1202    Home Medications   Current Meds  Medication Sig  . aspirin 81 MG tablet Take 81 mg by mouth daily.   . carvedilol (COREG) 12.5 MG tablet Take 1 tablet (12.5 mg total) by mouth 2 (two) times  daily with a meal.  . clopidogrel (PLAVIX) 75 MG tablet Take 75 mg by mouth daily.  Marland Kitchen ezetimibe (ZETIA) 10 MG tablet Take 1 tablet (10 mg total) by mouth daily.  . isosorbide mononitrate (IMDUR) 30 MG 24 hr tablet Take 1 tablet (30 mg total) by mouth daily.  . nitroGLYCERIN (NITROSTAT) 0.4 MG SL tablet Place 0.4 mg under the tongue every 5 (five) minutes as needed for chest pain.  . pantoprazole (PROTONIX) 40 MG tablet Take 1 tablet (40 mg total) by mouth daily.  . rosuvastatin (CRESTOR) 40 MG tablet Take 1 tablet (40 mg total) by mouth daily.  . tamsulosin (FLOMAX) 0.4 MG CAPS capsule Take 1 capsule (0.4 mg total) by mouth daily.  Marland Kitchen telmisartan (MICARDIS) 80 MG tablet Take 80 mg by mouth daily.    Review of Systems   Review of Systems  Constitutional: Negative for chills, fever and malaise/fatigue.  Cardiovascular:  Positive for dyspnea on exertion. Negative for chest pain, irregular heartbeat, leg swelling, near-syncope, orthopnea, palpitations and syncope.  Respiratory: Negative for cough, shortness of breath and wheezing.   Skin: Positive for poor wound healing.  Gastrointestinal: Negative for melena, nausea and vomiting.  Genitourinary: Negative for hematuria.  Neurological: Negative for dizziness, light-headedness and weakness.   All other systems reviewed and are otherwise negative except as noted above.  Physical Exam    VS:  BP 130/72 (BP Location: Left Arm, Patient Position: Sitting, Cuff Size: Normal)   Pulse 80   Ht 5\' 10"  (1.778 m)   Wt 206 lb 2 oz (93.5 kg)   SpO2 96%   BMI 29.58 kg/m  , BMI Body mass index is 29.58 kg/m. GEN: Well nourished, well developed, in no acute distress. HEENT: normal. Neck: Supple, no JVD, carotid bruits, or masses. Cardiac: RRR, no murmurs, rubs, or gallops. No clubbing, cyanosis, edema.  Radials/PT 2+ and equal bilaterally.  Bilateral femoral 2+ Respiratory:  Respirations regular and unlabored, clear to auscultation bilaterally. GI:  Soft, nontender, nondistended. MS: No deformity or atrophy. Skin: Warm and dry, no rash.  Right groin catheterization site tender on palpation. Purple ecchymosis to the right thigh. On palpation, mobile and firm to touch area of tenderness 0.5"x0.5". Neuro:  Strength and sensation are intact. Psych: Normal affect.  Assessment & Plan    1. CAD -cardiac cath 10/06/2019 with moderate disease of mid to distal left main moderate to severe disease of proximal to mid LAD with in-stent restenosis.  Given mild symptoms to be recommended for medical management.  Reports improvement in breathing and blood pressure since addition of Imdur.  If he is worsening anginal symptoms will need to consider CABG per Dr. Rockey Situ. GDMT includes aspirin, beta blocker, statin, Imdur, He does have very tender right groin site with small knot just beneath the surface of the skin.  Mild ecchymosis, no bleeding nor exudate.  Dr. Rockey Situ was able to assess in clinic and it is likely small fluid or pus collection below the surface.  Start Keflex 500 mg 4 times per day for 5 days.  If it is not improving in 1 week he will call our office.  Will defer ultrasound until that time.  Bilateral femoral/DP 2+.  Plan of care discussed and agreed on by Dr. Rockey Situ.  2. Cardiovascular clearance - Exercise tolerance >4 METS. Known moderate coronary disease recommended for medical management. No indication for further cardiovascular examination. Plan for upcoming colonoscopy/hernia repair. He is deemed acceptable risk for the planned procedure. As he is >1 year from stent appropriate to hold Plavix 3-5 days prior to procedure and resume when deemed safe by surgeon. Would maintain aspirin without interruption.  3. PAD - Recent ABI stable without significant PAD.  Encouraged to remain, continue cholesterol management.  Reports no worsening claudication symptoms.  4. HTN - BP well controlled. Continue current antihypertensive regimen.   5. HLD -  09/09/19 LDL 101.  Continue Zetia 10 mg daily.  Crestor recently uptitrated to 40 mg.  Plan for repeat lipid panel at follow-up.  6. DM2 - Continue to follow with PCP.   7. RBBB, LPFB, LBBB -noted by prior EKG.  He declines EKG today.  No lightheadedness, dizziness, syncope, near syncope.  Disposition: Follow up at the end of October as previously scheduled with Dr. Rockey Situ or APP.   Loel Dubonnet, NP 10/12/2019, 8:10 AM

## 2019-10-12 ENCOUNTER — Encounter: Payer: Self-pay | Admitting: Family

## 2019-10-12 ENCOUNTER — Ambulatory Visit: Payer: Medicare Other | Admitting: Family

## 2019-10-12 ENCOUNTER — Other Ambulatory Visit: Payer: Self-pay

## 2019-10-12 VITALS — BP 130/72 | HR 80 | Ht 70.0 in | Wt 206.1 lb

## 2019-10-12 DIAGNOSIS — L03818 Cellulitis of other sites: Secondary | ICD-10-CM | POA: Diagnosis not present

## 2019-10-12 DIAGNOSIS — I25118 Atherosclerotic heart disease of native coronary artery with other forms of angina pectoris: Secondary | ICD-10-CM | POA: Diagnosis not present

## 2019-10-12 DIAGNOSIS — E785 Hyperlipidemia, unspecified: Secondary | ICD-10-CM | POA: Diagnosis not present

## 2019-10-12 DIAGNOSIS — I10 Essential (primary) hypertension: Secondary | ICD-10-CM | POA: Diagnosis not present

## 2019-10-12 MED ORDER — CEPHALEXIN 500 MG PO CAPS: 500.0000 mg | ORAL_CAPSULE | Freq: Four times a day (QID) | ORAL | 0 refills | Status: DC

## 2019-10-12 NOTE — Patient Instructions (Signed)
Medication Instructions:  Your physician has recommended you make the following change in your medication:   START Keflex 500mg  4 times per day for 5 days  *If you need a refill on your cardiac medications before your next appointment, please call your pharmacy*   Lab Work: No lab work today.   Testing/Procedures: None ordered today.  Follow-Up: At [facility], you and your health needs are our priority.  As part of our continuing mission to provide you with exceptional heart care, we have created designated Provider Care Teams.  These Care Teams include your primary Cardiologist (physician) and Advanced Practice Providers (APPs -  Physician Assistants and Nurse Practitioners) who all work together to provide you with the care you need, when you need it.  We recommend signing up for the patient portal called "MyChart".  Sign up information is provided on this After Visit Summary.  MyChart is used to connect with patients for Virtual Visits (Telemedicine).  Patients are able to view lab/test results, encounter notes, upcoming appointments, etc.  Non-urgent messages can be sent to your provider as well.   To learn more about what you can do with MyChart, go to [email or web address].    Your next appointment:   In October with Dr. Rockey Situ as previously scheduled  The format for your next appointment:   In Person  Provider:    You may see Ida Rogue, MD or one of the following Advanced Practice Providers on your designated Care Team:    Murray Hodgkins, NP  Christell Faith, PA-C  Marrianne Mood, PA-C  Laurann Montana, NP  Other Instructions  If your groin site does not improve over the next week, please call our office.

## 2019-10-13 NOTE — Telephone Encounter (Signed)
Acceptable though moderate risk for procedure He does have underlying coronary disease, medical management has been recommended He can stop Plavix 5 days prior to procedure but would stay on aspirin Would restart Plavix per surgeon following the procedure

## 2019-10-13 NOTE — Progress Notes (Addendum)
Cardiac Clearance has been received from [facility]. The patient has been cleared at moderate risk for surgery. He can stop Plavix 5 days prior to surgery and would stay on Aspirin.  All notes are in Epic.

## 2019-10-20 ENCOUNTER — Ambulatory Visit: Payer: Medicare Other | Admitting: Family

## 2019-10-20 ENCOUNTER — Telehealth: Payer: Self-pay | Admitting: Surgery

## 2019-10-20 ENCOUNTER — Telehealth: Payer: Self-pay | Admitting: Family

## 2019-10-20 NOTE — Telephone Encounter (Signed)
Updated information regarding rescheduled surgery.  Patient has been advised of Pre-Admission date/time, COVID Testing date and Surgery date.  Surgery Date: 12/13/19 Preadmission Testing Date: 12/02/19 (phone 1p-5p) Covid Testing Date: 12/09/19 - patient advised to go to the [place] ([place]) between 8a-1p   Patient has been made aware to call [phone], between 1-3:00pm the day before surgery, to find out what time to arrive for surgery.

## 2019-10-20 NOTE — Telephone Encounter (Signed)
Tells me he has finished the course of antibiotics. Reports still 3/4" "knot" but no longer hurting. He is unable to visualize the right groin well. He is concerned with how hard the spot is. Reports no exudate, no lower extremity weakness or numbness.  Offered him an appointment with Dr. Rockey Situ on 10/25/19 but he told me it was difficult with his schedule. Scheduled for follow up appointment for recheck of groin site 10/28/19 at 0900 with myself.   Loel Dubonnet, NP

## 2019-10-24 ENCOUNTER — Other Ambulatory Visit: Payer: Medicare Other

## 2019-10-24 ENCOUNTER — Ambulatory Visit: Payer: Medicare Other | Admitting: Physician Assistant

## 2019-10-24 ENCOUNTER — Telehealth: Payer: Self-pay | Admitting: Surgery

## 2019-10-24 NOTE — Telephone Encounter (Signed)
Patient had some questions about staying overnight in the hospital. Patient is aware to call back if he has any further questions.

## 2019-10-24 NOTE — Telephone Encounter (Signed)
Patient is calling and has some questions and is asking for one of the nurses to give him a call back. Please call patient and advise.

## 2019-10-28 ENCOUNTER — Other Ambulatory Visit: Payer: Medicare Other

## 2019-10-28 ENCOUNTER — Ambulatory Visit: Payer: Medicare Other | Admitting: Family

## 2019-11-16 NOTE — Progress Notes (Addendum)
[facility] Perioperative Services: Pre-Admission/Anesthesia Testing     Date: 11/16/19  Name: Ricky Abbott MRN:   [id number]  Re: Updated PAT clinical review and cardiac clearance for surgery   Case: [id number] Date/Time: 12/13/19 1145   Procedure: XI ROBOT ASSISTED UMBILICAL HERNIA REPAIR AND PLICATION OF DIASTASIS RECTI (N/A )   Anesthesia type: General   Pre-op diagnosis: Umbilical hernia, diastasis recti   Location: [facility] OR ROOM 04 / [place] ORS FOR ANESTHESIA GROUP   Surgeons: Olean Ree, MD     Patient scheduled for the above procedure initially on 09/27/2019, however was ultimately postponed due to need for further cardiovascular evaluation.  Results from cardiovascular testing included below for review by the surgical team. Patient was seen in follow-up consult on 10/12/2019 by cardiology Gilford Rile, NP); notes reviewed.  Patient complained of continued exertional dyspnea.  Patient on GDMT including ASA, beta-blocker, statin, and nitrate.  Of note, nitrate (isosorbide mononitrate) was added following patient's recent cardiovascular testing he was experiencing bruising and tenderness to the right groin following recent cardiac catheterization.  Concern for possible abscess at catheterization site.  Patient started on 5-day course of cephalexin.  Cardiology notes the patient has documented DASI of >4 METS.  Dr. Rockey Situ (cardiology) notes the patient is "at an acceptable, though MODERATE risk, for planned procedure.  Patient does have underlying CAD, however medical management has been recommended".  Clopidogrel can be held 5 days prior to the procedure, however daily low-dose ASA should be continued without interruption throughout the perioperative course. Patient is scheduled to follow-up with outpatient cardiology towards the end of October 2021 for ongoing evaluation.   ECHOCARDIOGRAM done on 10/07/2019 1. LVEF, by estimation, is 60 to 65%. The LV has normal  functionno RWMAs. LV diastolic parameters are consistent with Grade I diastolic dysfunction (impaired relaxation).  2. RV systolic function is normal. The RV size is normal.  3. Tricuspid regurgitation signal is inadequate for assessing PA pressure.  4. Mild mitral valve regurgitation.    LEFT HEART CATHETERIZATION AND CORONARY ANGIOGRAPHY done on 10/06/2019  1. Moderate disease of mid to distal left main 2. Moderate to severe disease of proximal to the mid LAD with in-stent restenosis 3. Left dominant with small diffusely diseased RCA 4. Grossly normal EF of 55-65% by visual estimate 5. LV end-diastolic pressure is normal 6. No AV stenosis 7. Given mild symptoms recommendations were for medical management.  Worsening angina or anginal equivalent symptoms will need to consider CABG with LIMA to the LAD.   LEXISCAN done on 09/22/2019 1. Pharmacological MPI study with mild to moderate ischemia in the inferior wall 2. Unable to exclude diaphragmatic attenuation artifact 3. Normal wall motion, EF estimated at 63% 4. No EKG changes concerning for ischemia at peak stress or in recovery. 5. Moderate risk scan 6. Patient notes indicating previously noted predominantly fixed perfusion defect in the inferior wall on stress imaging 2015 7. Clinical correlation recommended.  Could consider alternate form of imaging such as cardiac CTA versus catheterization if symptoms indicate.  Impression and Plan:  STANLY SI has been referred for pre-anesthesia review and clearance prior to him undergoing the planned anesthetic and procedural courses. Available labs, pertinent testing, and imaging results were personally reviewed by me. This patient has been appropriately cleared by cardiology.   Based on clinical review performed today (11/16/19), barring any significant acute changes in the patient's overall condition, it is anticipated that he will be able to proceed with the planned surgical  intervention. Any acute changes in clinical condition may necessitate his procedure being postponed and/or cancelled. Pre-surgical instructions were reviewed with the patient during his PAT appointment and questions were fielded by PAT clinical staff.  Honor Loh, MSN, APRN, FNP-C, CEN [facility]  Peri-operative Services Nurse Practitioner Phone: [phone] 11/16/19 8:53 AM

## 2019-11-29 ENCOUNTER — Telehealth: Payer: Self-pay | Admitting: Gastroenterology

## 2019-11-29 NOTE — Telephone Encounter (Signed)
Patient states due to insurance issues he needs to cancel his procedure with Dr. Vicente Males for 11.2.21 and will call back to resch some time next year. Please cancel pt procedure and covid screening appt.

## 2019-11-29 NOTE — Telephone Encounter (Signed)
Procedure has been cancelled.

## 2019-11-30 ENCOUNTER — Telehealth: Payer: Self-pay | Admitting: Surgery

## 2019-11-30 NOTE — Telephone Encounter (Signed)
Received call from the patient, he has now decided not to proceed with surgery on 12/13/19.  Wants to cancel all appointments.  He states that he wants to wait about a year or better.  Is in the process of possibly getting new insurance.  Surgery for 12/13/19 is cancelled at patient request and he is informed that when he decides to proceed with surgery, that he will need to follow up with Dr. Hampton Abbot first before we can proceed rescheduling.  Patient voices understanding and is wished well.

## 2019-12-02 ENCOUNTER — Other Ambulatory Visit: Payer: Medicare Other

## 2019-12-06 ENCOUNTER — Ambulatory Visit: Payer: Medicare Other | Admitting: Cardiovascular Disease

## 2019-12-09 ENCOUNTER — Other Ambulatory Visit: Payer: Medicare Other

## 2019-12-13 ENCOUNTER — Encounter: Admission: RE | Payer: Self-pay | Source: Ambulatory Visit

## 2019-12-13 ENCOUNTER — Telehealth: Payer: Self-pay | Admitting: Cardiovascular Disease

## 2019-12-13 ENCOUNTER — Ambulatory Visit: Admission: RE | Admit: 2019-12-13 | Payer: Medicare Other | Source: Ambulatory Visit | Admitting: Surgery

## 2019-12-13 SURGERY — REPAIR, HERNIA, UMBILICAL, ROBOT-ASSISTED
Anesthesia: General

## 2019-12-13 SURGERY — COLONOSCOPY WITH PROPOFOL
Anesthesia: General

## 2019-12-13 NOTE — Telephone Encounter (Signed)
Patient returning vm.  We were calling to discuss information received from pcp per patient .     Not previous note.

## 2019-12-16 NOTE — Telephone Encounter (Signed)
Patient audibly upset about another call.  He states he does not want to come in sooner as he needs to wait to have the copay.  Patient states this should be communicated to everyone . He states we do not communicate here and we should .  Patient made aware that this would be passed on for everyone  to see per request .    Do not call to be seen sooner .

## 2019-12-21 NOTE — Telephone Encounter (Signed)
He has been scheduled for 01/02/20.  Loel Dubonnet, NP

## 2019-12-30 NOTE — Telephone Encounter (Signed)
Patient cancelled upcoming visit stating  not needed right now will wait until January as scheduled .

## 2020-01-02 ENCOUNTER — Ambulatory Visit: Payer: Medicare Other | Admitting: Family

## 2020-01-30 ENCOUNTER — Encounter: Payer: Self-pay | Admitting: Family

## 2020-02-01 ENCOUNTER — Encounter: Payer: Self-pay | Admitting: Family

## 2020-02-01 ENCOUNTER — Other Ambulatory Visit: Payer: Self-pay

## 2020-02-01 ENCOUNTER — Ambulatory Visit: Payer: Medicare Other | Admitting: Family

## 2020-02-01 VITALS — BP 102/64 | HR 88 | Ht 70.0 in | Wt 208.0 lb

## 2020-02-01 DIAGNOSIS — I10 Essential (primary) hypertension: Secondary | ICD-10-CM | POA: Diagnosis not present

## 2020-02-01 DIAGNOSIS — E782 Mixed hyperlipidemia: Secondary | ICD-10-CM

## 2020-02-01 DIAGNOSIS — I447 Left bundle-branch block, unspecified: Secondary | ICD-10-CM

## 2020-02-01 DIAGNOSIS — R42 Dizziness and giddiness: Secondary | ICD-10-CM | POA: Diagnosis not present

## 2020-02-01 DIAGNOSIS — I951 Orthostatic hypotension: Secondary | ICD-10-CM | POA: Diagnosis not present

## 2020-02-01 DIAGNOSIS — I739 Peripheral vascular disease, unspecified: Secondary | ICD-10-CM

## 2020-02-01 DIAGNOSIS — I4510 Unspecified right bundle-branch block: Secondary | ICD-10-CM

## 2020-02-01 DIAGNOSIS — I445 Left posterior fascicular block: Secondary | ICD-10-CM

## 2020-02-01 DIAGNOSIS — I25118 Atherosclerotic heart disease of native coronary artery with other forms of angina pectoris: Secondary | ICD-10-CM

## 2020-02-01 DIAGNOSIS — Z794 Long term (current) use of insulin: Secondary | ICD-10-CM

## 2020-02-01 DIAGNOSIS — E118 Type 2 diabetes mellitus with unspecified complications: Secondary | ICD-10-CM

## 2020-02-01 MED ORDER — ISOSORBIDE MONONITRATE ER 30 MG PO TB24: 15.0000 mg | ORAL_TABLET | Freq: Every day | ORAL | 2 refills | Status: DC

## 2020-02-01 NOTE — Progress Notes (Signed)
Office Visit    Patient Name: Ricky Abbott Date of Encounter: 02/01/2020  Primary Care Provider:  Crissie Figures, PA-C Primary Cardiologist:  Ida Rogue, MD Electrophysiologist:  None   Chief Complaint    Ricky Abbott is a 74 y.o. male with a hx of CAD (s/p stent 20 year ago in [place], [place] 2014 s/p stent at [facility]), HLD, PTSD, etoh use, depression, DM2, hepatitis, HTN, COPD, sarcoidosis of lung presents today for hypotension.  Past Medical History    Past Medical History:  Diagnosis Date  . Allergy   . Anxiety   . Asthma   . CAD, multiple vessel   . COPD (chronic obstructive pulmonary disease) ([place])   . Depression   . Diabetes mellitus without complication ([place])   . Dyspnea   . GERD (gastroesophageal reflux disease)   . History of chicken pox   . Hyperlipidemia   . Hypertension   . MI (myocardial infarction) ([place])   . Prostate enlargement   . PTSD (post-traumatic stress disorder)    [place] [facility]  . Sarcoidosis of lung [facility])    Past Surgical History:  Procedure Laterality Date  . billary tube placement    . [place], [place]  . CARDIAC CATHETERIZATION     [facility]   . CHOLECYSTECTOMY N/A 06/05/2016   Procedure: LAPAROSCOPIC CHOLECYSTECTOMY with cholangiogram;  Surgeon: Jules Husbands, MD;  Location: [facility] ORS;  Service: General;  Laterality: N/A;  . [place], 05/2012   x 2  . IR GENERIC HISTORICAL  04/30/2016   IR PERC CHOLECYSTOSTOMY 04/30/2016 [facility]-INTERV RAD  . LEFT HEART CATH AND CORONARY ANGIOGRAPHY Left 10/06/2019   Procedure: LEFT HEART CATH AND CORONARY ANGIOGRAPHY;  Surgeon: Minna Merritts, MD;  Location: [place] CV LAB;  Service: Cardiovascular;  Laterality: Left;    Allergies  Allergies  Allergen Reactions  . Peanut-Containing Drug Products Swelling    Swelling, rash, (Peanuts)    History of Present Illness    Ricky Abbott is a 74 y.o. male with a hx of CAD (s/p  stent 20 year ago in [place], [place] 2014 s/p stent at [facility]), HLD, PTSD, etoh use, depression, DM2, hepatitis, HTN, COPD, sarcoidosis of lung. He was last seen 10/12/19.  Seen 09/09/19 for clearance for colonoscopy and umbilical hernia repair. He noted SOB and DOE along with LE claudication symptoms.  He underwent Lexiscan Myoview 09/22/2019 which was moderate risk scan with mild to moderate ischemia of the inferior wall.  He was recommended for cardiac catheterization which was performed 10/06/2019 by Dr. Rockey Situ with ostial-proximal LAD 70% with in-stent restenosis, ostial-mid LM 60%, proximal/mid LAD 60%, first marginal 70%, EF 55 to 65%, normal LVEDP.  Due to mild symptoms he was recommended to pursue medical management.  For worsening anginal symptoms would need to consider CABG with LIMA to LAD.  Imdur was added for blood pressure control.  ABI perform 10/07/19 were stable compared to previous, recommended for repeat study in 1 year, no significant PAD noted. Echo 10/07/19 EF 60-65%, no RWMA, gr1DD, RV normal size/function, mild MR.  Seen in follow-up 10/12/2019. He had some soreness at the right groin catheterization site and was provided with a course of Keflex.  We received communication from his primary care provider that he was having episodes of hypotension and he was encouraged to follow-up with [place].  His torsemide was reduced from 80 mg to 40 mg but he tells me  he has been holding at the direction of his primary care provider.  Reports lightheadedness and near syncope. Happens when he changes positions as well as when sitting to have a bowel movement.  Likely orthostatic hypotension as well as vasovagal.  When asked about hydration, tells me he drinks about 24 oz of unsweetened tea throughout the day.  Tells me he was drinking an energy drink but his primary care asked him to stop.    EKGs/Labs/Other Studies Reviewed:   The following studies were reviewed today:  Cardiac cath 10/06/19 Ost LAD to Prox  LAD lesion is 70% stenosed. Ost LM to Mid LM lesion is 60% stenosed. Prox LAD to Mid LAD lesion is 60% stenosed. 1st Mrg lesion is 70% stenosed. The left ventricular systolic function is normal. LV end diastolic pressure is normal. The left ventricular ejection fraction is 55-65% by visual estimate. There is no aortic valve stenosis. Echo 04/2016 - Left ventricle: The cavity size was at the upper limits of    normal. Wall thickness was at the upper limits of normal.    Systolic function was normal. The estimated ejection fraction was    in the range of 55% to 60%. Wall motion was normal; there were no    regional wall motion abnormalities. Doppler parameters are    consistent with abnormal left ventricular relaxation (grade 1    diastolic dysfunction).  - Left atrium: The atrium was mildly dilated.  - Right ventricle: The cavity size was normal. Systolic function    was normal.    Arterial study 2018 Widely patent arteries above the right knee and throughout the left side. 50-74% stenosis of the right tibio-peroneal trunk. 30-49% stenosis of the right proximal anterior tibial artery. Three-vessel run-off. bilaterally. See arterial Doppler study of the same date. PV consult recommended.    Carotid  2018 Heterogeneous plaque, bilaterally. 1-39% bilateral ICA stenosis. Patent vertebral arteries with antegrade flow. Normal subclavian arteries, bilaterally.   EKG:  No EKG today. Patient politely declines.  Recent Labs: 09/09/2019: ALT 36; BUN 22; Creatinine, Ser 0.93; Potassium 4.4; Sodium 136 09/23/2019: Hemoglobin 14.8; Platelets 204  Recent Lipid Panel    Component Value Date/Time   CHOL 169 09/09/2019 1356   TRIG 194 (H) 09/09/2019 1356   HDL 41 09/09/2019 1356   CHOLHDL 4.1 09/09/2019 1356   CHOLHDL 5 10/16/2016 0842   VLDL 20.2 10/16/2016 0842   LDLCALC 95 09/09/2019 1356   LDLDIRECT 101 (H) 09/09/2019 1356   LDLDIRECT 117.0 04/18/2016 1202    Home Medications    Current Meds  Medication Sig  . albuterol (VENTOLIN HFA) 108 (90 Base) MCG/ACT inhaler   . aspirin 81 MG tablet Take 81 mg by mouth daily.   . carvedilol (COREG) 12.5 MG tablet Take 1 tablet (12.5 mg total) by mouth 2 (two) times daily with a meal.  . clopidogrel (PLAVIX) 75 MG tablet Take 75 mg by mouth daily.  Marland Kitchen ezetimibe (ZETIA) 10 MG tablet Take 1 tablet (10 mg total) by mouth daily.  . isosorbide mononitrate (IMDUR) 30 MG 24 hr tablet Take 1 tablet (30 mg total) by mouth daily.  . nitroGLYCERIN (NITROSTAT) 0.4 MG SL tablet Place 0.4 mg under the tongue every 5 (five) minutes as needed for chest pain.  . pantoprazole (PROTONIX) 40 MG tablet Take 1 tablet (40 mg total) by mouth daily.  . rosuvastatin (CRESTOR) 40 MG tablet Take 1 tablet (40 mg total) by mouth daily.  . sertraline (ZOLOFT) 25 MG tablet  Take by mouth.  . tamsulosin (FLOMAX) 0.4 MG CAPS capsule Take 1 capsule (0.4 mg total) by mouth daily.  Marland Kitchen telmisartan (MICARDIS) 40 MG tablet Take 40 mg by mouth daily.    Review of Systems   All other systems reviewed and are otherwise negative except as noted above.  Physical Exam    VS:  BP 102/64 (BP Location: Left Arm, Patient Position: Sitting, Cuff Size: Normal)   Pulse 88   Ht 5\' 10"  (1.778 m)   Wt 208 lb (94.3 kg)   SpO2 96%   BMI 29.84 kg/m  , BMI Body mass index is 29.84 kg/m. GEN: Well nourished, well developed, in no acute distress. HEENT: normal. Neck: Supple, no JVD, carotid bruits, or masses. Cardiac: RRR, no murmurs, rubs, or gallops. No clubbing, cyanosis, edema.  Radials/PT 2+ and equal bilaterally.  Bilateral femoral 2+ Respiratory:  Respirations regular and unlabored, clear to auscultation bilaterally. GI: Soft, nontender, nondistended. MS: No deformity or atrophy. Skin: Warm and dry, no rash.   Neuro:  Strength and sensation are intact. Psych: Normal affect.  Assessment & Plan    1. Orthostatic hypotension/vasovagal near syncope-has been following  with primary care due to low blood pressure. He endorses lightheadedness with near syncope with position changes or with bearing down to have bowel movement. Likely orthostatic hypotension and vasovagal response. No syncope. Per his report, telmisartan reduced to 40 mg and held by his primary care provider. He has not been taking and blood pressure remains low at 102/64 today. Will discontinue telmisartan. We will reduce his Imdur from 30 mg to 15 mg daily. If blood pressure remains low may consider discontinuation of Imdur versus decrease dose of Coreg. Anticipates that his symptoms are exacerbated by poor fluid intake. He drinks only 24 ounces of unsweetened caffeinated tea per day. Encouraged to increase fluid intake. He has prompt follow-up with his primary care provider in 2 weeks.  2. CAD -Cardiac cath 10/06/2019 with moderate disease of mid to distal left main moderate to severe disease of proximal to mid LAD with in-stent restenosis.  Given mild symptoms to be recommended for medical management.  Reports no chest pain, dyspnea on exertion.  GDMT includes aspirin, Coreg, Imdur, Crestor.  3. PAD - ABI 09/2019 stable without significant PAD.  Encouraged to remain active, continue cholesterol management.  Reports no worsening claudication symptoms.  4. HTN - BP well controlled. Continue current antihypertensive regimen.   5. HLD - 09/09/19 LDL 101.  Continue Zetia 10 mg daily, Crestor 40mg  daily.  Tells me he recently had lab work by his PCP.  Will request for review.  6. DM2 - Continue to follow with PCP.   7. RBBB, LPFB, LBBB - Noted by prior EKG.  He declines EKG today.  Anticipate his present symptoms of lightheadedness are caused by orthostatic hypotension.  If these do not resolve with resolution of hypotension will need repeat EKG and likely ZIO monitor  Disposition: Follow up in 6 month(s) with Dr. Rockey Situ or APP.   Loel Dubonnet, NP 02/01/2020, 8:32 AM

## 2020-02-01 NOTE — Patient Instructions (Addendum)
Medication Instructions:  Your physician has recommended you make the following change in your medication:   CHANGE Isosorbide Mononitrate (Imdur) to 15mg  (half tablet) once daily  STOP Telmisartan  Use Docusate Sodium (Colace) as needed for constipation.  *If you need a refill on your cardiac medications before your next appointment, please call your pharmacy*  Lab Work: None ordered today.   Testing/Procedures: None ordered today.   Follow-Up: At [facility], you and your health needs are our priority.  As part of our continuing mission to provide you with exceptional heart care, we have created designated Provider Care Teams.  These Care Teams include your primary Cardiologist (physician) and Advanced Practice Providers (APPs -  Physician Assistants and Nurse Practitioners) who all work together to provide you with the care you need, when you need it.  We recommend signing up for the patient portal called "MyChart".  Sign up information is provided on this After Visit Summary.  MyChart is used to connect with patients for Virtual Visits (Telemedicine).  Patients are able to view lab/test results, encounter notes, upcoming appointments, etc.  Non-urgent messages can be sent to your provider as well.   To learn more about what you can do with MyChart, go to [email or web address].    Your next appointment:   6 month(s)  The format for your next appointment:   In Person  Provider:   You may see Ida Rogue, MD or one of the following Advanced Practice Providers on your designated Care Team:    Murray Hodgkins, NP  Christell Faith, PA-C  Marrianne Mood, PA-C  Cadence Kathlen Mody, [place]  Laurann Montana, NP   Other Instructions   Recommend increasing your fluid intake and drinking more water.   Orthostatic Hypotension Blood pressure is a measurement of how strongly, or weakly, your blood is pressing against the walls of your arteries. Orthostatic hypotension is a sudden  drop in blood pressure that happens when you quickly change positions, such as when you get up from sitting or lying down. Arteries are blood vessels that carry blood from your heart throughout your body. When blood pressure is too low, you may not get enough blood to your brain or to the rest of your organs. This can cause weakness, light-headedness, rapid heartbeat, and fainting. This can last for just a few seconds or for up to a few minutes. Orthostatic hypotension is usually not a serious problem. However, if it happens frequently or gets worse, it may be a sign of something more serious. What are the causes? This condition may be caused by:  Sudden changes in posture, such as standing up quickly after you have been sitting or lying down.  Blood loss.  Loss of body fluids (dehydration).  Heart problems.  Hormone (endocrine) problems.  Pregnancy.  Severe infection.  Lack of certain nutrients.  Severe allergic reactions (anaphylaxis).  Certain medicines, such as blood pressure medicine or medicines that make the body lose excess fluids (diuretics). Sometimes, this condition can be caused by not taking medicine as directed, such as taking too much of a certain medicine. What increases the risk? The following factors may make you more likely to develop this condition:  Age. Risk increases as you get older.  Conditions that affect the heart or the central nervous system.  Taking certain medicines, such as blood pressure medicine or diuretics.  Being pregnant. What are the signs or symptoms? Symptoms of this condition may include:  Weakness.  Light-headedness.  Dizziness.  Blurred vision.  Fatigue.  Rapid heartbeat.  Fainting, in severe cases. How is this diagnosed? This condition is diagnosed based on:  Your medical history.  Your symptoms.  Your blood pressure measurement. Your health care provider will check your blood pressure when you are: ? Lying  down. ? Sitting. ? Standing. A blood pressure reading is recorded as two numbers, such as "120 over 80" (or 120/80). The first ("top") number is called the systolic pressure. It is a measure of the pressure in your arteries as your heart beats. The second ("bottom") number is called the diastolic pressure. It is a measure of the pressure in your arteries when your heart relaxes between beats. Blood pressure is measured in a unit called mm Hg. Healthy blood pressure for most adults is 120/80. If your blood pressure is below 90/60, you may be diagnosed with hypotension. Other information or tests that may be used to diagnose orthostatic hypotension include:  Your other vital signs, such as your heart rate and temperature.  Blood tests.  Tilt table test. For this test, you will be safely secured to a table that moves you from a lying position to an upright position. Your heart rhythm and blood pressure will be monitored during the test. How is this treated? This condition may be treated by:  Changing your diet. This may involve eating more salt (sodium) or drinking more water.  Taking medicines to raise your blood pressure.  Changing the dosage of certain medicines you are taking that might be lowering your blood pressure.  Wearing compression stockings. These stockings help to prevent blood clots and reduce swelling in your legs. In some cases, you may need to go to the hospital for:  Fluid replacement. This means you will receive fluids through an IV.  Blood replacement. This means you will receive donated blood through an IV (transfusion).  Treating an infection or heart problems, if this applies.  Monitoring. You may need to be monitored while medicines that you are taking wear off. Follow these instructions at home: Eating and drinking   Drink enough fluid to keep your urine pale yellow.  Eat a healthy diet, and follow instructions from your health care provider about eating or  drinking restrictions. A healthy diet includes: ? Fresh fruits and vegetables. ? Whole grains. ? Lean meats. ? Low-fat dairy products.  Eat extra salt only as directed. Do not add extra salt to your diet unless your health care provider told you to do that.  Eat frequent, small meals.  Avoid standing up suddenly after eating. Medicines  Take over-the-counter and prescription medicines only as told by your health care provider. ? Follow instructions from your health care provider about changing the dosage of your current medicines, if this applies. ? Do not stop or adjust any of your medicines on your own. General instructions   Wear compression stockings as told by your health care provider.  Get up slowly from lying down or sitting positions. This gives your blood pressure a chance to adjust.  Avoid hot showers and excessive heat as directed by your health care provider.  Return to your normal activities as told by your health care provider. Ask your health care provider what activities are safe for you.  Do not use any products that contain nicotine or tobacco, such as cigarettes, e-cigarettes, and chewing tobacco. If you need help quitting, ask your health care provider.  Keep all follow-up visits as told by your health care provider. This is important. Contact a  health care provider if you:  Vomit.  Have diarrhea.  Have a fever for more than 2-3 days.  Feel more thirsty than usual.  Feel weak and tired. Get help right away if you:  Have chest pain.  Have a fast or irregular heartbeat.  Develop numbness in any part of your body.  Cannot move your arms or your legs.  Have trouble speaking.  Become sweaty or feel light-headed.  Faint.  Feel short of breath.  Have trouble staying awake.  Feel confused. Summary  Orthostatic hypotension is a sudden drop in blood pressure that happens when you quickly change positions.  Orthostatic hypotension is usually  not a serious problem.  It is diagnosed by having your blood pressure taken lying down, sitting, and then standing.  It may be treated by changing your diet or adjusting your medicines. This information is not intended to replace advice given to you by your health care provider. Make sure you discuss any questions you have with your health care provider. Document Revised: 07/23/2017 Document Reviewed: 07/23/2017 Elsevier Patient Education  2020 [employer].

## 2020-02-14 ENCOUNTER — Encounter: Payer: Self-pay | Admitting: Cardiovascular Disease

## 2020-02-16 ENCOUNTER — Telehealth: Payer: Self-pay | Admitting: *Deleted

## 2020-02-16 NOTE — Telephone Encounter (Signed)
Spoke to [facility] pharmacist and confirmed that the prescription is correct. Ok for patient to take isosorbide mononitrate 15 mg (0.5 tablet) by mouth once a day. They will prepare and send to patient.

## 2020-02-16 NOTE — Telephone Encounter (Signed)
Received a fax from [facility] stating Isosorbide cannot be split in half as this may alter the rate of drug release. Please advise about the prescription.

## 2020-03-07 ENCOUNTER — Ambulatory Visit: Payer: Medicare Other | Admitting: Cardiovascular Disease

## 2020-03-19 ENCOUNTER — Other Ambulatory Visit: Payer: Self-pay

## 2020-03-19 ENCOUNTER — Other Ambulatory Visit: Payer: Self-pay | Admitting: Cardiovascular Disease

## 2020-03-19 MED ORDER — ISOSORBIDE MONONITRATE ER 30 MG PO TB24: 30.0000 mg | ORAL_TABLET | Freq: Every day | ORAL | 3 refills | Status: DC

## 2020-03-19 NOTE — Telephone Encounter (Signed)
Attempted to reach pt. Ricky Abbott and left detail message, okay by DPR. Advised Lamonte Sakai, NP advised that his IMDUR was reduce d/t hypotension. If he is monitoring his BP and is consistently more than 120/80 he could increase to 30 mg tablet daily as before. If BP is running consistently under 110/70, she suggested discontinuation of Imdur altogether since pt does not feel comfortable cutting the pill in half, this medication does not come in a  15 mg tab. Will await pt's call back.     2:25pm   Pt return call, advised as stated above what [name], NP suggested about his IMDUR. Pt reports still has about 10-15 pills left that is already halved and he will take those for BP under 710 systolic and take the 30mg  for BP 626 systolic and above. Pt reports will start monitoring his BP with the machine he received from this clinic a few years ago. Otherwise all questions or concerns were address and no additional concerns at this time. Agreeable to plan, will call back for anything further.

## 2020-03-19 NOTE — Telephone Encounter (Signed)
Imdur was previously reduced to 15mg  daily due to hypotension. If his blood pressure is consistently more than 120/80 he could increase to 30mg  tablet daily. If BP remains low, consistently <110/70 could recommend discontinuation of Imdur.   Loel Dubonnet, NP

## 2020-03-19 NOTE — Telephone Encounter (Signed)
IMDUR was last refilled under you, pt would like change in medication, this medication does not come in a 15 mg tab, pt does not feel comfortable halving the dose d/t pill not being scorned. Any suggestion?  Thanks, [employer]

## 2020-03-19 NOTE — Telephone Encounter (Signed)
*  STAT* If patient is at the pharmacy, call can be transferred to refill team.   1. Which medications need to be refilled? (please list name of each medication and dose if known) isosorbide 15 mg (really wants the 15 mg rather than 30 mg to not have to cut them)  2. Which pharmacy/location (including street and city if local pharmacy) is medication to be sent to?[facility]  3. Do they need a 30 day or 90 day supply? [place]

## 2020-03-19 NOTE — Telephone Encounter (Signed)
Spoke with patient and informed him that isosorbide mononitrate tablets are not available in 15 mg tablets after discussion with Ilene Qua, RN. Patient states the 30mg  tablets are not scored and he has not been able to cut them in half equally. He does not feel comfortable taking the medication this way and would like to know if he can have an alternative drug to isosorbide mononitrate. Please advise.

## 2020-05-02 DIAGNOSIS — E1142 Type 2 diabetes mellitus with diabetic polyneuropathy: Secondary | ICD-10-CM

## 2020-05-02 LAB — GLUCOSE, POCT (MANUAL RESULT ENTRY): POC Glucose: 139 mg/dl — AB (ref 70–99)

## 2020-05-02 NOTE — Congregational Nurse Program (Signed)
  Dept: [place] Nurse Program Note  Date of Encounter: 05/02/2020  Client in for BP and glucose check   Past Medical History: Past Medical History:  Diagnosis Date  . Allergy   . Anxiety   . Asthma   . CAD, multiple vessel   . COPD (chronic obstructive pulmonary disease) ([place])   . Depression   . Diabetes mellitus without complication ([place])   . Dyspnea   . GERD (gastroesophageal reflux disease)   . History of chicken pox   . Hyperlipidemia   . Hypertension   . MI (myocardial infarction) ([place])   . Prostate enlargement   . PTSD (post-traumatic stress disorder)    [place] Vet  . Sarcoidosis of lung [facility])     Encounter Details:  CNP Questionnaire - 05/02/20 1224      Questionnaire   Do you give verbal consent to treat you today? Yes    [facility]   Location Patient Served At Not Applicable   [place]   Patient Status Not Applicable    Medical Provider Yes    Insurance Medicare    Intervention Assess (including screenings)   BP check   Housing/Utilities --   patient has housing   Transportation --   none reported   Interpersonal Safety --   no personal safety concerns   Food --   none reported at this time   Medication --   non reported   Referrals --   none at this time   Screening Referrals --   none needed   ED Visit Averted --   n/a   Life-Saving Intervention Made --   n/a

## 2020-05-09 DIAGNOSIS — E1142 Type 2 diabetes mellitus with diabetic polyneuropathy: Secondary | ICD-10-CM

## 2020-05-09 LAB — GLUCOSE, POCT (MANUAL RESULT ENTRY): POC Glucose: 121 mg/dl — AB (ref 70–99)

## 2020-05-09 NOTE — Congregational Nurse Program (Signed)
  Dept: [phone]   Congregational Nurse Program Note  Date of Encounter: 05/09/2020 BP 142/66 Blood glucose 121 Past Medical History: Past Medical History:  Diagnosis Date  . Allergy   . Anxiety   . Asthma   . CAD, multiple vessel   . COPD (chronic obstructive pulmonary disease) ([place])   . Depression   . Diabetes mellitus without complication ([place])   . Dyspnea   . GERD (gastroesophageal reflux disease)   . History of chicken pox   . Hyperlipidemia   . Hypertension   . MI (myocardial infarction) ([place])   . Prostate enlargement   . PTSD (post-traumatic stress disorder)    [place] Vet  . Sarcoidosis of lung [facility])     Encounter Details:  CNP Questionnaire - 05/09/20 1255      Questionnaire   Do you give verbal consent to treat you today? Yes    Visit Setting Other    Location Patient Served At Not Applicable    Patient Status Not Applicable    Medical Provider Yes    Insurance Medicare    Intervention Assess (including screenings)

## 2020-05-16 DIAGNOSIS — E1142 Type 2 diabetes mellitus with diabetic polyneuropathy: Secondary | ICD-10-CM

## 2020-05-16 LAB — GLUCOSE, POCT (MANUAL RESULT ENTRY): POC Glucose: 133 mg/dl — AB (ref 70–99)

## 2020-05-16 NOTE — Congregational Nurse Program (Signed)
  Dept: [phone]   Congregational Nurse Program Note  Date of Encounter: 05/16/2020 Client to clinic for weekly blood pressure check. BP 128/68 Past Medical History: Past Medical History:  Diagnosis Date  . Allergy   . Anxiety   . Asthma   . CAD, multiple vessel   . COPD (chronic obstructive pulmonary disease) ([place])   . Depression   . Diabetes mellitus without complication ([place])   . Dyspnea   . GERD (gastroesophageal reflux disease)   . History of chicken pox   . Hyperlipidemia   . Hypertension   . MI (myocardial infarction) ([place])   . Prostate enlargement   . PTSD (post-traumatic stress disorder)    [place] Vet  . Sarcoidosis of lung ([place])     Encounter Details:

## 2020-05-23 DIAGNOSIS — E1142 Type 2 diabetes mellitus with diabetic polyneuropathy: Secondary | ICD-10-CM

## 2020-05-23 LAB — GLUCOSE, POCT (MANUAL RESULT ENTRY): POC Glucose: 167 mg/dl — AB (ref 70–99)

## 2020-05-23 NOTE — Congregational Nurse Program (Signed)
  Dept: [place] Nurse Program Note  Date of Encounter: 05/23/2020 Client to clinic for weekly blood pressure and blood sugar check. Client shared he would not be at clinic next week, will retrun on 4/27.  Past Medical History: Past Medical History:  Diagnosis Date  . Allergy   . Anxiety   . Asthma   . CAD, multiple vessel   . COPD (chronic obstructive pulmonary disease) ([place])   . Depression   . Diabetes mellitus without complication ([place])   . Dyspnea   . GERD (gastroesophageal reflux disease)   . History of chicken pox   . Hyperlipidemia   . Hypertension   . MI (myocardial infarction) ([place])   . Prostate enlargement   . PTSD (post-traumatic stress disorder)    [place] Vet  . Sarcoidosis of lung [facility])     Encounter Details:  CNP Questionnaire - 05/23/20 1233      Questionnaire   Do you give verbal consent to treat you today? Yes    Visit Setting Other    Location Patient Served At Not Applicable    Patient Status Not Applicable    Medical Provider Yes    Insurance Medicare    Intervention Assess (including screenings)

## 2020-06-06 DIAGNOSIS — E1142 Type 2 diabetes mellitus with diabetic polyneuropathy: Secondary | ICD-10-CM

## 2020-06-06 NOTE — Congregational Nurse Program (Signed)
  Dept: [place] Nurse Program Note  Date of Encounter: 06/06/2020  Vital signs: Blood pressure 152/56 blood glucose finger stick 135. Education provided on nutrition per patient request.   Past Medical History: Past Medical History:  Diagnosis Date  . Allergy   . Anxiety   . Asthma   . CAD, multiple vessel   . COPD (chronic obstructive pulmonary disease) ([place])   . Depression   . Diabetes mellitus without complication ([place])   . Dyspnea   . GERD (gastroesophageal reflux disease)   . History of chicken pox   . Hyperlipidemia   . Hypertension   . MI (myocardial infarction) ([place])   . Prostate enlargement   . PTSD (post-traumatic stress disorder)    [place] Vet  . Sarcoidosis of lung [facility])     Encounter Details:  CNP Questionnaire - 06/06/20 1041      Questionnaire   Do you give verbal consent to treat you today? Yes    Visit Setting Other    Location Patient Served At Not Applicable    Patient Status Not Applicable    Medical Provider Yes    Insurance Medicare    Intervention Assess (including screenings)

## 2020-06-07 ENCOUNTER — Telehealth: Payer: Self-pay | Admitting: Cardiovascular Disease

## 2020-06-07 NOTE — Telephone Encounter (Signed)
Was able to reach pt regarding their request for a [place] disability parking pass. Advised based on their cardiac history, unable to have Dr. Rockey Situ sign application as they do not meet the [employer] requirement to uphold a parking place card. Advised falseitizing a disability parking pass is illegal in [place] and Dr. Rockey Situ or other provider can be penalize if audited.   Noted by [place] Division of Motor Vehicles it requires  A person to have a cardiac condition to the extent that the person's functional limitations are classified in severity as Class III or Class IV according to standards set by the American Heart Association  Cannot walk 200 feet without stopping to rest  D/t shob associated with CHF  Or other cardiac related condition  There are other criterias that a person can meet outside the cardiologist recommendation and a PCP, pulmonologist, orthopedics, or other speciality can sign off on if their medical condition falls under the  requirements such as   Cannot walk without the use of, or assistance from, a brace, cane, crutch, another person, prosthetic device, wheelchair, or other assistive device  Is restricted by lung disease to such an extent that the person's forced (respiratory) expiratory volume of one second, when measured by spirometry, is   less than one liter, or the arterial oxygen tension is less than 60 mm/hg on room air at rest  Uses portable oxygen  Is severely limited in their ability to walk due to an arthritic, neurological, or orthopedic condition  Is totally blind or whose vision with glasses is so defective as to prevent the performance of ordinary activity for which eyesight is essential, as certified by a   licensed ophthalmologist, optometrist, or the Division of Services for the Blind  Based on other determining factors based on pt's history, reach out to one of their other providers to see if they can effectively grant them a disability parking pass. Pt  verbalized understanding, thankful for the return call and explanation. Will call back with any further concerns or questions.   Has f/u appt with Dr. Rockey Situ 08/01/20 at 8am, requested letter to be mailed to remind pt of appt so he may mark his calender when he returns home. Will send out letter today.

## 2020-06-07 NOTE — Telephone Encounter (Signed)
Patient calling in to ask if he would be qualified to get a handicap placard and if Dr.Gollan would sign off for it  Please advise

## 2020-06-13 DIAGNOSIS — E1142 Type 2 diabetes mellitus with diabetic polyneuropathy: Secondary | ICD-10-CM

## 2020-06-13 LAB — GLUCOSE, POCT (MANUAL RESULT ENTRY)
POC Glucose: 147 mg/dl — AB (ref 70–99)
POC Glucose: 147 mg/dl — AB (ref 70–99)

## 2020-06-13 NOTE — Congregational Nurse Program (Signed)
  Dept: [place] Nurse Program Note  Date of Encounter: 06/13/2020 Client to clinic for weekly blood pressure and blood sugar check. No new concerns. Will continue to attend clinic weekly.   Past Medical History: Past Medical History:  Diagnosis Date  . Allergy   . Anxiety   . Asthma   . CAD, multiple vessel   . COPD (chronic obstructive pulmonary disease) ([place])   . Depression   . Diabetes mellitus without complication ([place])   . Dyspnea   . GERD (gastroesophageal reflux disease)   . History of chicken pox   . Hyperlipidemia   . Hypertension   . MI (myocardial infarction) ([place])   . Prostate enlargement   . PTSD (post-traumatic stress disorder)    [place] Vet  . Sarcoidosis of lung [facility])     Encounter Details:  CNP Questionnaire - 06/13/20 1000      Questionnaire   Do you give verbal consent to treat you today? Yes    Visit Setting Other    Location Patient Served At Not Applicable    Patient Status Not Applicable    Medical Provider Yes    Insurance Medicare    Intervention Assess (including screenings)

## 2020-06-20 DIAGNOSIS — E1142 Type 2 diabetes mellitus with diabetic polyneuropathy: Secondary | ICD-10-CM

## 2020-06-20 LAB — GLUCOSE, POCT (MANUAL RESULT ENTRY): POC Glucose: 117 mg/dl — AB (ref 70–99)

## 2020-06-20 NOTE — Congregational Nurse Program (Signed)
  Dept: [place] Nurse Program Note  Date of Encounter: 06/20/2020 Client in for weekly blood pressure and blood sugar check. No new concerns. BP 122/66, Hr 70, Blood sugar 117, non-fasting.  Past Medical History: Past Medical History:  Diagnosis Date  . Allergy   . Anxiety   . Asthma   . CAD, multiple vessel   . COPD (chronic obstructive pulmonary disease) ([place])   . Depression   . Diabetes mellitus without complication ([place])   . Dyspnea   . GERD (gastroesophageal reflux disease)   . History of chicken pox   . Hyperlipidemia   . Hypertension   . MI (myocardial infarction) ([place])   . Prostate enlargement   . PTSD (post-traumatic stress disorder)    [place] V[place]  . Sarcoidosis of lung [facility])     Encounter Details:  CNP Questionnaire - 06/20/20 1000      Questionnaire   Do you give verbal consent to treat you today? Yes    Visit Setting Other    Location Patient Served At Not Applicable    Patient Status Not Applicable    Medical Provider Yes    Insurance Medicare    Intervention Assess (including screenings)

## 2020-07-11 ENCOUNTER — Other Ambulatory Visit: Payer: Self-pay

## 2020-07-11 DIAGNOSIS — E1142 Type 2 diabetes mellitus with diabetic polyneuropathy: Secondary | ICD-10-CM

## 2020-07-11 LAB — GLUCOSE, POCT (MANUAL RESULT ENTRY): POC Glucose: 147 mg/dl — AB (ref 70–99)

## 2020-07-11 MED ORDER — NITROGLYCERIN 0.4 MG SL SUBL: 0.4000 mg | SUBLINGUAL_TABLET | SUBLINGUAL | 0 refills | Status: DC | PRN

## 2020-07-11 NOTE — Congregational Nurse Program (Signed)
  Dept: [phone]   Congregational Nurse Program Note  Date of Encounter: 07/11/2020 Client to clinic for vital sign and glucose check. VS WNL for clientt, no new concerns. Client plans to continue to come weekly to clinic.  Past Medical History: Past Medical History:  Diagnosis Date  . Allergy   . Anxiety   . Asthma   . CAD, multiple vessel   . COPD (chronic obstructive pulmonary disease) ([place])   . Depression   . Diabetes mellitus without complication ([place])   . Dyspnea   . GERD (gastroesophageal reflux disease)   . History of chicken pox   . Hyperlipidemia   . Hypertension   . MI (myocardial infarction) ([place])   . Prostate enlargement   . PTSD (post-traumatic stress disorder)    [place] Vet  . Sarcoidosis of lung [facility])     Encounter Details:  CNP Questionnaire - 07/11/20 1714      Questionnaire   Do you give verbal consent to treat you today? Yes    Visit Setting Other    Location Patient Served At [place]    Patient Status Not Applicable    Medical Provider Yes    Insurance Medicare    Intervention Assess (including screenings)

## 2020-07-11 NOTE — Telephone Encounter (Signed)
*  STAT* If patient is at the pharmacy, call can be transferred to refill team.   1. Which medications need to be refilled? (please list name of each medication and dose if known) Nitroglycerin  2. Which pharmacy/location (including street and city if local pharmacy) is medication to be sent to? [facility]  3. Do they need a 30 day or 90 day supply? [place]

## 2020-07-18 DIAGNOSIS — E1142 Type 2 diabetes mellitus with diabetic polyneuropathy: Secondary | ICD-10-CM

## 2020-07-18 LAB — GLUCOSE, POCT (MANUAL RESULT ENTRY): POC Glucose: 156 mg/dl — AB (ref 70–99)

## 2020-07-18 NOTE — Congregational Nurse Program (Signed)
  Dept: [place] Nurse Program Note  Date of Encounter: 07/18/2020 Client in for weekly blood sugar and blood pressure check. Blood sugar 156 nonfasting, BP 122/64, hr 73. Nno new concerns at this time.   Past Medical History: Past Medical History:  Diagnosis Date  . Allergy   . Anxiety   . Asthma   . CAD, multiple vessel   . COPD (chronic obstructive pulmonary disease) ([place])   . Depression   . Diabetes mellitus without complication ([place])   . Dyspnea   . GERD (gastroesophageal reflux disease)   . History of chicken pox   . Hyperlipidemia   . Hypertension   . MI (myocardial infarction) ([place])   . Prostate enlargement   . PTSD (post-traumatic stress disorder)    [place] Vet  . Sarcoidosis of lung ([place])     Encounter Details:

## 2020-07-25 NOTE — Congregational Nurse Program (Signed)
  Dept: [phone]   Congregational Nurse Program Note  Date of Encounter: 07/25/2020 Client to clinic for weekly vital sign check. BP 130/75, 77, 99% on room air. No new concerns at this time.    Past Medical History: Past Medical History:  Diagnosis Date   Allergy    Anxiety    Asthma    CAD, multiple vessel    COPD (chronic obstructive pulmonary disease) ([place])    Depression    Diabetes mellitus without complication ([facility])    Dyspnea    GERD (gastroesophageal reflux disease)    History of chicken pox    Hyperlipidemia    Hypertension    MI (myocardial infarction) ([place])    Prostate enlargement    PTSD (post-traumatic stress disorder)    [place] Vet   Sarcoidosis of lung ([place])     Encounter Details:  CNP Questionnaire - 07/25/20 1249       Questionnaire   Do you give verbal consent to treat you today? Yes    Visit Setting Other    Location Patient Served At [place]    Patient Status Not Applicable    Medical Provider Yes    Insurance Medicare    Intervention Assess (including screenings)

## 2020-08-01 ENCOUNTER — Other Ambulatory Visit: Payer: Self-pay

## 2020-08-01 ENCOUNTER — Telehealth: Payer: Self-pay | Admitting: Cardiovascular Disease

## 2020-08-01 ENCOUNTER — Ambulatory Visit: Payer: Medicare Other | Admitting: Cardiovascular Disease

## 2020-08-01 ENCOUNTER — Encounter: Payer: Self-pay | Admitting: Cardiovascular Disease

## 2020-08-01 VITALS — BP 112/54 | HR 74 | Ht 70.0 in | Wt 194.4 lb

## 2020-08-01 DIAGNOSIS — I10 Essential (primary) hypertension: Secondary | ICD-10-CM

## 2020-08-01 DIAGNOSIS — E1142 Type 2 diabetes mellitus with diabetic polyneuropathy: Secondary | ICD-10-CM

## 2020-08-01 DIAGNOSIS — I25118 Atherosclerotic heart disease of native coronary artery with other forms of angina pectoris: Secondary | ICD-10-CM | POA: Diagnosis not present

## 2020-08-01 DIAGNOSIS — R0609 Other forms of dyspnea: Secondary | ICD-10-CM

## 2020-08-01 DIAGNOSIS — I2510 Atherosclerotic heart disease of native coronary artery without angina pectoris: Secondary | ICD-10-CM

## 2020-08-01 DIAGNOSIS — R0600 Dyspnea, unspecified: Secondary | ICD-10-CM

## 2020-08-01 LAB — GLUCOSE, POCT (MANUAL RESULT ENTRY): POC Glucose: 153 mg/dl — AB (ref 70–99)

## 2020-08-01 MED ORDER — NITROGLYCERIN 0.4 MG SL SUBL
0.4000 mg | SUBLINGUAL_TABLET | SUBLINGUAL | 6 refills | Status: DC | PRN
Start: 2020-08-01 — End: 2020-11-06

## 2020-08-01 NOTE — Patient Instructions (Addendum)
Medication Instructions:  No changes  Make sure you are on the crestor and zetia  If you need a refill on your cardiac medications before your next appointment, please call your pharmacy.   Lab work: No new labs needed  Testing/Procedures: No new testing needed  Follow-Up: At [facility], you and your health needs are our priority.  As part of our continuing mission to provide you with exceptional heart care, we have created designated Provider Care Teams.  These Care Teams include your primary Cardiologist (physician) and Advanced Practice Providers (APPs -  Physician Assistants and Nurse Practitioners) who all work together to provide you with the care you need, when you need it.  You will need a follow up appointment in 6 months, APP ok  Providers on your designated Care Team:   Murray Hodgkins, NP Christell Faith, PA-C Marrianne Mood, PA-C [place], [place]  COVID-19 Vaccine Information can be found at: [email or web address] For questions related to vaccine distribution or appointments, please email vaccine@ [email or web address] or call [phone].

## 2020-08-01 NOTE — Telephone Encounter (Signed)
Patient came in to drop off pictures of medications, put in box

## 2020-08-01 NOTE — Telephone Encounter (Signed)
Noted  

## 2020-08-01 NOTE — Progress Notes (Signed)
Cardiology Office Note  Alcohol abuse Date:  08/01/2020   ID:  ABSHIR PAOLINI, DOB 02-09-1946, MRN [id number]  PCP:  Crissie Figures, PA-C   Chief Complaint  Patient presents with   6 month follow up     Patient c/o chest pain very frequently within the past 3 weeks and has shortness of breath. Medications reviewed by the patient verbally.     HPI:  75 yo caucasian male with  diabetes type 2,  PTSD,   coronary artery disease with stent placed 20 years ago in [place],  [place] in April 2014 presenting [facility] with stent placed at that time  smoking, quit in 1984,  alcohol, depression, Long history of drinking 40 ounces beers Previous insurance and financial issues affecting his follow-up in clinic and medication compliance.   Previously in [place] Presents for routine follow-up for chest pain, coronary artery disease  Last seen in clinic by myself January 2021  Cardiac cath 10/06/2019 with moderate disease of mid to distal left main moderate to severe disease of proximal to mid LAD with in-stent restenosis.    Echo  1. Left ventricular ejection fraction, by estimation, is 60 to 65%. The  left ventricle has normal function. The left ventricle has no regional  wall motion abnormalities. Left ventricular diastolic parameters are  consistent with Grade I diastolic  dysfunction (impaired relaxation).   2. Right ventricular systolic function is normal. The right ventricular  size is normal. Tricuspid regurgitation signal is inadequate for assessing  PA pressure.   3. Mild mitral valve regurgitation.  Seen by one of our providers December 2021 At that time reported having hypotension On that visit telmisartan was discontinued Imdur decreased from 30 down to 15 Encouraged to increase his fluid intake  Does not know medications today,  Chest pain April, after dinner while sitting None recently  Labs in 11/2018 HBA1C 6.8 CR 1.0 LFTS normal Total chol 165, LDL  108  Inactive, sedentary  EKG personally reviewed by myself on todays visit Shows NSR rate 74 bpm, no significant ST or T wave changes  Other past medical hx stress test November 2015 showing no ischemia, there was old scar in the mid to apical inferior wall   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 He was taken to [facility], transferred by helicopter to [facility].    history of  PTSD. 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 Has never been treated for this.     PMH:   has a past medical history of Allergy, Anxiety, Asthma, CAD, multiple vessel, COPD (chronic obstructive pulmonary disease) ([place]), Depression, Diabetes mellitus without complication ([place]), Dyspnea, GERD (gastroesophageal reflux disease), History of chicken pox, Hyperlipidemia, Hypertension, MI (myocardial infarction) ([place]), Prostate enlargement, PTSD (post-traumatic stress disorder), and Sarcoidosis of lung ([place]).  PSH:    Past Surgical History:  Procedure Laterality Date   billary tube placement     CARDIAC CATHETERIZATION     [place], [place] N/A 06/05/2016   Procedure: LAPAROSCOPIC CHOLECYSTECTOMY with cholangiogram;  Surgeon: Jules Husbands, MD;  Location: [facility] ORS;  Service: General;  Laterality: N/A;   [place], 05/2012   x 2   IR GENERIC HISTORICAL  04/30/2016   IR  PERC CHOLECYSTOSTOMY 04/30/2016 [facility]-INTERV RAD   LEFT HEART CATH AND CORONARY ANGIOGRAPHY Left 10/06/2019   Procedure: LEFT HEART CATH AND CORONARY ANGIOGRAPHY;  Surgeon: Minna Merritts, MD;  Location: [place] [facility];  Service: Cardiovascular;  Laterality: Left;    Current Outpatient Medications  Medication Sig Dispense Refill   albuterol (VENTOLIN HFA) 108 (90 Base) MCG/ACT inhaler       aspirin 81 MG tablet Take 81 mg by mouth daily.      carvedilol (COREG) 12.5 MG tablet Take 1 tablet (12.5 mg total) by mouth 2 (two) times daily with a meal. 180 tablet 1   clopidogrel (PLAVIX) 75 MG tablet Take 75 mg by mouth daily.     ezetimibe (ZETIA) 10 MG tablet Take 1 tablet (10 mg total) by mouth daily. 90 tablet 3   isosorbide mononitrate (IMDUR) 30 MG 24 hr tablet Take 1 tablet (30 mg total) by mouth daily. Take for BP 678 systolic or above, take half pill for BP 938 systolic or below 90 tablet 3   nitroGLYCERIN (NITROSTAT) 0.4 MG SL tablet Place 1 tablet (0.4 mg total) under the tongue every 5 (five) minutes as needed for chest pain. 25 tablet 0   pantoprazole (PROTONIX) 40 MG tablet Take 1 tablet (40 mg total) by mouth daily. 90 tablet 1   rosuvastatin (CRESTOR) 40 MG tablet Take 1 tablet (40 mg total) by mouth daily. 90 tablet 3   sertraline (ZOLOFT) 25 MG tablet Take by mouth.     sertraline (ZOLOFT) 50 MG tablet Take 1 tablet by mouth daily.     tamsulosin (FLOMAX) 0.4 MG CAPS capsule Take 1 capsule (0.4 mg total) by mouth daily. 90 capsule 1   telmisartan (MICARDIS) 20 MG tablet Take 20 mg by mouth daily.     No current facility-administered medications for this visit.   Facility-Administered Medications Ordered in Other Visits  Medication Dose Route Frequency Provider Last Rate Last Admin   sodium chloride flush (NS) 0.9 % injection 3 mL  3 mL Intravenous Q12H Visser, Jacquelyn D, PA-C        Allergies:   Peanut-containing drug products   Social History:  The patient  reports that he quit smoking about 39 years ago. His smoking use included cigarettes. He has a 100.00 pack-year smoking history. He has never used smokeless tobacco. He reports that he does not drink alcohol and does not use drugs.   Family History:   family history includes Cancer in his brother, father, and mother; Hyperlipidemia in his father and mother; Hypertension in his father and mother.    Review of  Systems: Review of Systems  Constitutional: Negative.   HENT: Negative.    Respiratory: Negative.    Cardiovascular: Negative.   Gastrointestinal: Negative.   Musculoskeletal:  Positive for joint pain.  Neurological: Negative.   Psychiatric/Behavioral: Negative.    All other systems reviewed and are negative.  PHYSICAL EXAM: VS:  BP (!) 112/54 (BP Location: Left Arm, Patient Position: Sitting, Cuff Size: Normal)   Pulse 74   Ht 5\' 10"  (1.778 m)   Wt 194 lb 6 oz (88.2 kg)   SpO2 98%   BMI 27.89 kg/m  , BMI Body mass index is 27.89 kg/m. Constitutional:  oriented to person, place, and time. No distress.  HENT:  Head: Grossly normal Eyes:  no discharge. No scleral icterus.  Neck: No JVD, no carotid bruits  Cardiovascular: Regular rate and rhythm, no murmurs appreciated Pulmonary/Chest: Clear to auscultation bilaterally,  no wheezes or rails Abdominal: Soft.  no distension.  no tenderness.  Musculoskeletal: Normal range of motion Neurological:  normal muscle tone. Coordination normal. No atrophy Skin: Skin warm and dry Psychiatric: normal affect, pleasant  Recent Labs: 09/09/2019: ALT 36; BUN 22; Creatinine, Ser 0.93; Potassium 4.4; Sodium 136 09/23/2019: Hemoglobin 14.8; Platelets 204    Lipid Panel Lab Results  Component Value Date   CHOL 169 09/09/2019   HDL 41 09/09/2019   LDLCALC 95 09/09/2019   TRIG 194 (H) 09/09/2019      Wt Readings from Last 3 Encounters:  08/01/20 194 lb 6 oz (88.2 kg)  02/01/20 208 lb (94.3 kg)  10/12/19 206 lb 2 oz (93.5 kg)      ASSESSMENT AND PLAN:  CAD, multiple vessel   Stay on crestor and zetia for goal LDL <70 Labs through primary care  Pure hypercholesterolemia -  Crestor, zetia  Essential hypertension -  Blood pressure is well controlled on today's visit. No changes made to the medications.  Claudication in peripheral vascular disease ([place]) -   lower extremity Doppler with minimal disease on the right Minimal carotid  disease  H/O burning pain in leg  neuropathy , previous lower extremity arterial Doppler was essentially normal  PTSD (post-traumatic stress disorder) stable  Type 2 diabetes, HbA1c goal < 7% (HCC) Managed through primary care  Alcohol abuse Previous history of alcohol abuse.  Cessation recommended   Total encounter time more than 25 minutes  Greater than 50% was spent in counseling and coordination of care with the patient   Orders Placed This Encounter  Procedures   EKG 12-Lead     Signed, Esmond Plants, M.D., Ph.D. 08/01/2020  [place], [place]

## 2020-08-01 NOTE — Congregational Nurse Program (Signed)
  Dept: [facility] Nurse Program Note  Date of Encounter: 08/01/2020 Client to clinic for weekly vital sign and blood sugar check. He had just come from a cardiology appointment and reports that his telmisartin was discontinued. He returned to clinic a bit later after having seen his PCP and reported that his sertraline had been increased to 100 mg form 50 mg. Discussed possible side effect of increase that could included some sleepiness, he currently takes this medication in the morning,may change to evening. He plans to return to clinic next week as is his usual.  Past Medical History: Past Medical History:  Diagnosis Date   Allergy    Anxiety    Asthma    CAD, multiple vessel    COPD (chronic obstructive pulmonary disease) ([place])    Depression    Diabetes mellitus without complication (HCC)    Dyspnea    GERD (gastroesophageal reflux disease)    History of chicken pox    Hyperlipidemia    Hypertension    MI (myocardial infarction) ([place])    Prostate enlargement    PTSD (post-traumatic stress disorder)    [place] Vet   Sarcoidosis of lung ([place])     Encounter Details:  CNP Questionnaire - 08/01/20 1341       Questionnaire   Do you give verbal consent to treat you today? Yes    Visit Setting Other    Location Patient Served At [facility]    Patient Status Not Applicable    Medical Provider Yes    Insurance Medicare    Intervention Assess (including screenings)

## 2020-08-08 DIAGNOSIS — E1142 Type 2 diabetes mellitus with diabetic polyneuropathy: Secondary | ICD-10-CM

## 2020-08-08 LAB — GLUCOSE, POCT (MANUAL RESULT ENTRY): POC Glucose: 136 mg/dl — AB (ref 70–99)

## 2020-08-08 NOTE — Congregational Nurse Program (Signed)
  Dept: [facility] Nurse Program Note  Date of Encounter: 08/08/2020 Client to clinic for weekly blood pressure and blood sugar check. VSS. Blood glucose 136, he reports not having eaten yet this morning. No new concerns. Client made aware that there will be no clinic on 7/6.   Past Medical History: Past Medical History:  Diagnosis Date   Allergy    Anxiety    Asthma    CAD, multiple vessel    COPD (chronic obstructive pulmonary disease) ([place])    Depression    Diabetes mellitus without complication (HCC)    Dyspnea    GERD (gastroesophageal reflux disease)    History of chicken pox    Hyperlipidemia    Hypertension    MI (myocardial infarction) ([place])    Prostate enlargement    PTSD (post-traumatic stress disorder)    [place] Vet   Sarcoidosis of lung ([place])     Encounter Details:  CNP Questionnaire - 08/08/20 0900       Questionnaire   Do you give verbal consent to treat you today? Yes    Visit Setting Other    Location Patient Served At [facility]    Patient Status Not Applicable    Medical Provider Yes    Insurance Medicare    Intervention Assess (including screenings)

## 2020-08-22 DIAGNOSIS — E1142 Type 2 diabetes mellitus with diabetic polyneuropathy: Secondary | ICD-10-CM

## 2020-08-22 LAB — GLUCOSE, POCT (MANUAL RESULT ENTRY): POC Glucose: 136 mg/dl — AB (ref 70–99)

## 2020-08-22 NOTE — Congregational Nurse Program (Signed)
  Dept: [facility] Nurse Program Note  Date of Encounter: 08/22/2020 Client to clinic for weekly blood pressure and blood sugar check. No new medications or changes in health since last visit. BP 118/62, 59 99% room air. Blood sugar 133, nonfasting   Past Medical History: Past Medical History:  Diagnosis Date   Allergy    Anxiety    Asthma    CAD, multiple vessel    COPD (chronic obstructive pulmonary disease) ([place])    Depression    Diabetes mellitus without complication (HCC)    Dyspnea    GERD (gastroesophageal reflux disease)    History of chicken pox    Hyperlipidemia    Hypertension    MI (myocardial infarction) ([place])    Prostate enlargement    PTSD (post-traumatic stress disorder)    [place] Vet   Sarcoidosis of lung ([place])     Encounter Details:  CNP Questionnaire - 08/22/20 1007       Questionnaire   Do you give verbal consent to treat you today? Yes    Visit Setting Other    Location Patient Served At [facility]    Patient Status Not Applicable    Medical Provider Yes    Insurance Medicare    Intervention Assess (including screenings)

## 2020-08-29 DIAGNOSIS — E1142 Type 2 diabetes mellitus with diabetic polyneuropathy: Secondary | ICD-10-CM

## 2020-08-29 LAB — GLUCOSE, POCT (MANUAL RESULT ENTRY): POC Glucose: 125 mg/dl — AB (ref 70–99)

## 2020-08-29 NOTE — Congregational Nurse Program (Signed)
  Dept: [facility] Nurse Program Note  Date of Encounter: 08/29/2020 Client to clinic for weekly vital sign and blood glucose check..  Discussed his lack of family support. Health Care power of attorney discussed. He reports no family involved in his life, all family in the [place] area. Will continue to  gently discuss importance of support. Client appears to be very comfortable discussing his life challenges and past experiences.   Past Medical History: Past Medical History:  Diagnosis Date   Allergy    Anxiety    Asthma    CAD, multiple vessel    COPD (chronic obstructive pulmonary disease) ([place])    Depression    Diabetes mellitus without complication (HCC)    Dyspnea    GERD (gastroesophageal reflux disease)    History of chicken pox    Hyperlipidemia    Hypertension    MI (myocardial infarction) ([place])    Prostate enlargement    PTSD (post-traumatic stress disorder)    [place] Vet   Sarcoidosis of lung ([place])     Encounter Details:  CNP Questionnaire - 08/29/20 1015       Questionnaire   Do you give verbal consent to treat you today? Yes    Visit Setting Other    Location Patient Served At [facility]    Patient Status Not Applicable    Medical Provider Yes    Insurance Medicare    Intervention Assess (including screenings);Support    Transportation --   client had a car, but is now using a taxi services

## 2020-09-05 NOTE — Congregational Nurse Program (Signed)
  Dept: [phone]   Congregational Nurse Program Note  Date of Encounter: 09/05/2020 Client to clinic for weekly vital sign check. VS WNL. No new concerns or medical appointments since last visit. [place] Kitchen   Past Medical History: Past Medical History:  Diagnosis Date   Allergy    Anxiety    Asthma    CAD, multiple vessel    COPD (chronic obstructive pulmonary disease) ([place])    Depression    Diabetes mellitus without complication (HCC)    Dyspnea    GERD (gastroesophageal reflux disease)    History of chicken pox    Hyperlipidemia    Hypertension    MI (myocardial infarction) ([place])    Prostate enlargement    PTSD (post-traumatic stress disorder)    [place] Vet   Sarcoidosis of lung ([place])     Encounter Details:  CNP Questionnaire - 09/05/20 0915       Questionnaire   Do you give verbal consent to treat you today? Yes    Visit Setting Other    Location Patient Served At [facility]    Patient Status Not Applicable    Medical Provider Yes    Insurance Medicare    Intervention Assess (including screenings);Support    Transportation --   client had a car, but is now using a taxi services

## 2020-09-12 NOTE — Congregational Nurse Program (Signed)
  Dept: [phone]   Congregational Nurse Program Note  Date of Encounter: 09/12/2020 Client to clinic for weekly vital sign check and conversation/support. VSS. No new concerns, he plans to continue weekly clinic visits and is appreciative of time spent with RN.   Past Medical History: Past Medical History:  Diagnosis Date   Allergy    Anxiety    Asthma    CAD, multiple vessel    COPD (chronic obstructive pulmonary disease) ([place])    Depression    Diabetes mellitus without complication (HCC)    Dyspnea    GERD (gastroesophageal reflux disease)    History of chicken pox    Hyperlipidemia    Hypertension    MI (myocardial infarction) ([place])    Prostate enlargement    PTSD (post-traumatic stress disorder)    [place] Vet   Sarcoidosis of lung ([place])     Encounter Details:  CNP Questionnaire - 09/12/20 1000       Questionnaire   Do you give verbal consent to treat you today? Yes    Visit Setting Other    Location Patient Served At [facility]    Patient Status Not Applicable    Medical Provider Yes    Insurance Medicare    Intervention Assess (including screenings);Support    Transportation --   client had a car, but is now using a taxi services

## 2020-09-19 DIAGNOSIS — E1142 Type 2 diabetes mellitus with diabetic polyneuropathy: Secondary | ICD-10-CM

## 2020-09-19 LAB — GLUCOSE, POCT (MANUAL RESULT ENTRY): POC Glucose: 129 mg/dl — AB (ref 70–99)

## 2020-09-19 NOTE — Congregational Nurse Program (Signed)
  Dept: [facility] Nurse Program Note  Date of Encounter: 09/19/2020 Client to clinic for weekly vital signs and blood glucose check. No new concerns. He does have a PCP appointment next week and will be using [employer] transportation for the first time.     Past Medical History: Past Medical History:  Diagnosis Date   Allergy    Anxiety    Asthma    CAD, multiple vessel    COPD (chronic obstructive pulmonary disease) ([place])    Depression    Diabetes mellitus without complication (HCC)    Dyspnea    GERD (gastroesophageal reflux disease)    History of chicken pox    Hyperlipidemia    Hypertension    MI (myocardial infarction) ([place])    Prostate enlargement    PTSD (post-traumatic stress disorder)    [place] Vet   Sarcoidosis of lung ([place])     Encounter Details:  CNP Questionnaire - 09/19/20 0950       Questionnaire   Do you give verbal consent to treat you today? Yes    Visit Setting Other    Location Patient Served At [facility]    Patient Status Not Applicable    Medical Provider Yes    Insurance Medicare    Intervention Assess (including screenings);Support    Transportation --   client had a car, but is now using a taxi services

## 2020-10-03 DIAGNOSIS — E1142 Type 2 diabetes mellitus with diabetic polyneuropathy: Secondary | ICD-10-CM

## 2020-10-03 LAB — GLUCOSE, POCT (MANUAL RESULT ENTRY): POC Glucose: 122 mg/dl — AB (ref 70–99)

## 2020-10-03 NOTE — Congregational Nurse Program (Signed)
  Dept: [facility] Nurse Program Note  Date of Encounter: 10/03/2020 Client to clinic for weekly blood sugar  and vital sign check. VS: 122/78, 79 98% on room air.  No new concerns at this time. Support given.   Past Medical History: Past Medical History:  Diagnosis Date   Allergy    Anxiety    Asthma    CAD, multiple vessel    COPD (chronic obstructive pulmonary disease) ([place])    Depression    Diabetes mellitus without complication (HCC)    Dyspnea    GERD (gastroesophageal reflux disease)    History of chicken pox    Hyperlipidemia    Hypertension    MI (myocardial infarction) ([place])    Prostate enlargement    PTSD (post-traumatic stress disorder)    [place] Vet   Sarcoidosis of lung ([place])     Encounter Details:

## 2020-10-24 ENCOUNTER — Telehealth: Payer: Self-pay | Admitting: Cardiovascular Disease

## 2020-10-24 NOTE — Telephone Encounter (Signed)
Pt c/o of Chest Pain: STAT if CP now or developed within 24 hours  1. Are you having CP right now? Not now, but about 8 minutes ago  2. Are you experiencing any other symptoms (ex. SOB, nausea, vomiting, sweating)? No other symptoms  3. How long have you been experiencing CP? Last night and this morning.  4. Is your CP continuous or coming and going? continuous  5. Have you taken Nitroglycerin? yes ?

## 2020-10-24 NOTE — Telephone Encounter (Signed)
Incoming call from triage, Santiago Glad, RN with Congregational Nurse Program with [employer] calling in on behalf of Ricky Abbott. He was pushing his shopping cart to come see her in clinic as she is only available on Wed for well-check ups and he was having CP, BP 122/80. Nitro made CP better, also having intermittent CP at night.  Santiago Glad, RN is unable to due any diagnostic testing such as EKG or labs. Advised for pt to go to the ED, pt refused. Santiago Glad reports she had been trying to convince him for EMS or ED visit, pt refusing. EKG and trops are appropriate, but pt refused.   Wants an appt to be seen in office and only after 9/28 d/t income check from government. First available is 10/5 with [facility] , pt needs afternoon as he needs transportation or a cab to get him to his appt.   10/5 at 2:30 pm with Mickle Plumb.  Encourage Santiago Glad to persuade pt if possible if the CP continues and Nitro seems to help he needs to be evaluated in the ED. Otherwise we will see pt at his next appt.

## 2020-10-24 NOTE — Congregational Nurse Program (Signed)
  Dept: [facility] Nurse Program Note  Date of Encounter: 10/24/2020 Client to clinic today following a phone call placed by Rn this morning to check in on him. He had not come to clinic in 3 weeks. Client reports having chest pain during visit today. He also reports having chest pain at night, some last night. For which he had been taking nitroglycerin with reported relief.  He took  1 nitroglycerin tab during the visit and had relief. He denied nausea/vomiting, shortness of breath or any referred left arm pain. He reports he had seen his PCP on Monday and did not report this chest pain. With client's permission RN contacted his cardiologist office. RN spoke with triage RN, client was advised to go the [facility] ED for a troponin level, but he declined. Appointment was made for client on October 5 th at 2:30 pm with PA Lorenso Quarry. Client agreeable to this appointment, written information given. Client did not experience any recurrent chest pain  during visit.  RN again advised him to go to the emergency room if he continues to experience chest pain. He voiced understanding. VS; 122/80, 67 oxygen saturations 99% on Room air. Support given, client plans to return to clinic next week.  Past Medical History: Past Medical History:  Diagnosis Date   Allergy    Anxiety    Asthma    CAD, multiple vessel    COPD (chronic obstructive pulmonary disease) ([place])    Depression    Diabetes mellitus without complication (HCC)    Dyspnea    GERD (gastroesophageal reflux disease)    History of chicken pox    Hyperlipidemia    Hypertension    MI (myocardial infarction) ([place])    Prostate enlargement    PTSD (post-traumatic stress disorder)    [place] Vet   Sarcoidosis of lung ([place])     Encounter Details:

## 2020-11-05 ENCOUNTER — Emergency Department: Payer: Medicare Other

## 2020-11-05 ENCOUNTER — Inpatient Hospital Stay
Admission: EM | Admit: 2020-11-05 | Discharge: 2020-11-06 | DRG: 287 | Disposition: A | Discharge status: Other Acute Inpatient Hospital | Payer: Medicare Other | Attending: Internal Medicine | Admitting: Internal Medicine

## 2020-11-05 DIAGNOSIS — Z809 Family history of malignant neoplasm, unspecified: Secondary | ICD-10-CM

## 2020-11-05 DIAGNOSIS — K5900 Constipation, unspecified: Secondary | ICD-10-CM | POA: Diagnosis not present

## 2020-11-05 DIAGNOSIS — I25110 Atherosclerotic heart disease of native coronary artery with unstable angina pectoris: Secondary | ICD-10-CM | POA: Diagnosis not present

## 2020-11-05 DIAGNOSIS — Z87891 Personal history of nicotine dependence: Secondary | ICD-10-CM

## 2020-11-05 DIAGNOSIS — Z91010 Allergy to peanuts: Secondary | ICD-10-CM

## 2020-11-05 DIAGNOSIS — E1142 Type 2 diabetes mellitus with diabetic polyneuropathy: Secondary | ICD-10-CM | POA: Diagnosis present

## 2020-11-05 DIAGNOSIS — Z79899 Other long term (current) drug therapy: Secondary | ICD-10-CM

## 2020-11-05 DIAGNOSIS — R079 Chest pain, unspecified: Secondary | ICD-10-CM | POA: Diagnosis present

## 2020-11-05 DIAGNOSIS — I252 Old myocardial infarction: Secondary | ICD-10-CM | POA: Diagnosis not present

## 2020-11-05 DIAGNOSIS — Z83438 Family history of other disorder of lipoprotein metabolism and other lipidemia: Secondary | ICD-10-CM

## 2020-11-05 DIAGNOSIS — F4310 Post-traumatic stress disorder, unspecified: Secondary | ICD-10-CM | POA: Diagnosis not present

## 2020-11-05 DIAGNOSIS — Z8619 Personal history of other infectious and parasitic diseases: Secondary | ICD-10-CM | POA: Diagnosis not present

## 2020-11-05 DIAGNOSIS — I10 Essential (primary) hypertension: Secondary | ICD-10-CM | POA: Diagnosis present

## 2020-11-05 DIAGNOSIS — K219 Gastro-esophageal reflux disease without esophagitis: Secondary | ICD-10-CM | POA: Diagnosis not present

## 2020-11-05 DIAGNOSIS — D869 Sarcoidosis, unspecified: Secondary | ICD-10-CM | POA: Diagnosis present

## 2020-11-05 DIAGNOSIS — J449 Chronic obstructive pulmonary disease, unspecified: Secondary | ICD-10-CM | POA: Diagnosis not present

## 2020-11-05 DIAGNOSIS — Z20822 Contact with and (suspected) exposure to covid-19: Secondary | ICD-10-CM | POA: Diagnosis not present

## 2020-11-05 DIAGNOSIS — Z7982 Long term (current) use of aspirin: Secondary | ICD-10-CM

## 2020-11-05 DIAGNOSIS — T82855A Stenosis of coronary artery stent, initial encounter: Secondary | ICD-10-CM | POA: Diagnosis not present

## 2020-11-05 DIAGNOSIS — F32A Depression, unspecified: Secondary | ICD-10-CM | POA: Diagnosis present

## 2020-11-05 DIAGNOSIS — Z955 Presence of coronary angioplasty implant and graft: Secondary | ICD-10-CM

## 2020-11-05 DIAGNOSIS — N400 Enlarged prostate without lower urinary tract symptoms: Secondary | ICD-10-CM | POA: Diagnosis present

## 2020-11-05 DIAGNOSIS — I4510 Unspecified right bundle-branch block: Secondary | ICD-10-CM | POA: Diagnosis present

## 2020-11-05 DIAGNOSIS — Y831 Surgical operation with implant of artificial internal device as the cause of abnormal reaction of the patient, or of later complication, without mention of misadventure at the time of the procedure: Secondary | ICD-10-CM | POA: Diagnosis present

## 2020-11-05 DIAGNOSIS — E785 Hyperlipidemia, unspecified: Secondary | ICD-10-CM | POA: Diagnosis not present

## 2020-11-05 DIAGNOSIS — Z7902 Long term (current) use of antithrombotics/antiplatelets: Secondary | ICD-10-CM | POA: Diagnosis not present

## 2020-11-05 DIAGNOSIS — Z8249 Family history of ischemic heart disease and other diseases of the circulatory system: Secondary | ICD-10-CM | POA: Diagnosis not present

## 2020-11-05 LAB — COMPREHENSIVE METABOLIC PANEL
ALT: 30 U/L (ref 0–44)
AST: 26 U/L (ref 15–41)
Albumin: 4.1 g/dL (ref 3.5–5.0)
Alkaline Phosphatase: 75 U/L (ref 38–126)
Anion gap: 7 (ref 5–15)
BUN: 33 mg/dL — ABNORMAL HIGH (ref 8–23)
CO2: 24 mmol/L (ref 22–32)
Calcium: 9.3 mg/dL (ref 8.9–10.3)
Chloride: 108 mmol/L (ref 98–111)
Creatinine, Ser: 0.82 mg/dL (ref 0.61–1.24)
GFR, Estimated: >60 mL/min (ref >60)
Glucose, Bld: 108 mg/dL — ABNORMAL HIGH (ref 70–99)
Potassium: 3.6 mmol/L (ref 3.5–5.1)
Sodium: 139 mmol/L (ref 135–145)
Total Bilirubin: 1 mg/dL (ref 0.3–1.2)
Total Protein: 7.2 g/dL (ref 6.5–8.1)

## 2020-11-05 LAB — CBC WITH DIFFERENTIAL/PLATELET
Abs Immature Granulocytes: 0.02 10*3/uL (ref 0.00–0.07)
Basophils Absolute: 0.1 10*3/uL (ref 0.0–0.1)
Basophils Relative: 1 %
Eosinophils Absolute: 0.7 10*3/uL — ABNORMAL HIGH (ref 0.0–0.5)
Eosinophils Relative: 9 %
HCT: 40.8 % (ref 39.0–52.0)
Hemoglobin: 14.1 g/dL (ref 13.0–17.0)
Immature Granulocytes: 0 %
Lymphocytes Relative: 16 %
Lymphs Abs: 1.2 10*3/uL (ref 0.7–4.0)
MCH: 29.5 pg (ref 26.0–34.0)
MCHC: 34.6 g/dL (ref 30.0–36.0)
MCV: 85.4 fL (ref 80.0–100.0)
Monocytes Absolute: 0.9 10*3/uL (ref 0.1–1.0)
Monocytes Relative: 12 %
Neutro Abs: 4.8 10*3/uL (ref 1.7–7.7)
Neutrophils Relative %: 62 %
Platelets: 173 10*3/uL (ref 150–400)
RBC: 4.78 MIL/uL (ref 4.22–5.81)
RDW: 14.4 % (ref 11.5–15.5)
WBC: 7.7 10*3/uL (ref 4.0–10.5)
nRBC: 0 % (ref 0.0–0.2)

## 2020-11-05 LAB — APTT: aPTT: 28 seconds (ref 24–36)

## 2020-11-05 LAB — PROTIME-INR
INR: 1 (ref 0.8–1.2)
Prothrombin Time: 13.1 seconds (ref 11.4–15.2)

## 2020-11-05 LAB — RESP PANEL BY RT-PCR (FLU A&B, COVID) ARPGX2
Influenza A by PCR: NEGATIVE
Influenza B by PCR: NEGATIVE
SARS Coronavirus 2 by RT PCR: NEGATIVE

## 2020-11-05 LAB — TROPONIN I (HIGH SENSITIVITY): Troponin I (High Sensitivity): 5 ng/L (ref <18)

## 2020-11-05 MED ORDER — NITROGLYCERIN 2 % TD OINT
1.0000 [in_us] | TOPICAL_OINTMENT | Freq: Once | TRANSDERMAL | Status: AC
  Administered 2020-11-06: 1 [in_us] via TOPICAL
  Filled 2020-11-05: qty 1

## 2020-11-05 MED ORDER — MORPHINE SULFATE (PF) 4 MG/ML IV SOLN
4.0000 mg | Freq: Once | INTRAVENOUS | Status: AC
Start: 2020-11-05 — End: 2020-11-05
  Administered 2020-11-05: 4 mg via INTRAVENOUS
  Filled 2020-11-05: qty 1

## 2020-11-05 MED ORDER — ONDANSETRON HCL 4 MG/2ML IJ SOLN
4.0000 mg | Freq: Once | INTRAMUSCULAR | Status: AC
  Administered 2020-11-05: 4 mg via INTRAVENOUS
  Filled 2020-11-05: qty 2

## 2020-11-05 NOTE — ED Provider Notes (Signed)
[facility] Emergency Department Provider Note  ____________________________________________   Event Date/Time   First MD Initiated Contact with Patient 11/05/20 2134     (approximate)  I have reviewed the triage vital signs and the nursing notes.   HISTORY  Chief Complaint Chest pain   HPI Ricky Abbott is a 75 y.o. male with diabetes, coronary disease status post stents in 2014 who comes in with concerns for chest pain.  Patient reports that for the past 2 weeks he has had chest pain that wakes him up from sleep but he normally takes a nitro and the pain goes away.  He states today was the first time where he developed chest pain while watching TV.  He reports that it is a pressure sensation on the center of his chest,, nonradiating, better with nitro, nothing makes it worse.  He reports maybe a little bit of worsening shortness of breath with ambulation but that is been going on for over a year.  He denies any unilateral leg swelling, history of blood clots or shortness of breath at rest.  He states this mostly just the pain that is bothering him.  He does report having some constipation having a bowel movement today where he had to push really hard.  However he denies really any abdominal pain at this time.  Patient was given nitro with EMS as well as aspirin and his symptoms went away.  Patient reports he is taken a total of 6 nitro at home but that they were expired so is not sure if they are working.          Past Medical History:  Diagnosis Date   Allergy    Anxiety    Asthma    CAD, multiple vessel    COPD (chronic obstructive pulmonary disease) ([place])    Depression    Diabetes mellitus without complication (HCC)    Dyspnea    GERD (gastroesophageal reflux disease)    History of chicken pox    Hyperlipidemia    Hypertension    MI (myocardial infarction) ([place])    Prostate enlargement    PTSD (post-traumatic stress disorder)    [place] Vet    Sarcoidosis of lung ([place])     Patient Active Problem List   Diagnosis Date Noted   Unstable angina ([place]) 10/06/2019   Cholecystitis    Urinary hesitancy due to benign prostatic hyperplasia 05/17/2016   Cervical spondylosis with radiculopathy 05/17/2016   VBI (vertebrobasilar insufficiency) 05/17/2016   Pre-operative cardiovascular examination 04/28/2016   Medication management 04/28/2016   History of alcohol abuse 04/15/2016   Poor diet 04/15/2016   Chronic post-traumatic stress disorder (PTSD) 05/23/2015   DM type 2 with diabetic peripheral neuropathy ([place]) 05/21/2015   Erectile dysfunction 02/19/2015   COPD, mild ([place]) 03/08/2014   Dyspnea on exertion 03/02/2014   PTSD (post-traumatic stress disorder) 12/16/2013   Sarcoidosis 12/15/2013   History of hepatitis C virus infection 12/15/2013   S/P coronary artery stent placement 09/04/2013   Other chest pain 09/04/2013   CAD, multiple vessel 08/15/2013   Hyperlipidemia 08/15/2013   Essential hypertension 08/15/2013   Other fatigue 08/15/2013   Tubular adenoma of colon 08/15/2013   Need for tetanus booster 08/15/2013    Past Surgical History:  Procedure Laterality Date   billary tube placement     [place], [place] N/A 06/05/2016  Procedure: LAPAROSCOPIC CHOLECYSTECTOMY with cholangiogram;  Surgeon: Jules Husbands, MD;  Location: [facility] ORS;  Service: General;  Laterality: N/A;   [place], 05/2012   x 2   IR GENERIC HISTORICAL  04/30/2016   IR PERC CHOLECYSTOSTOMY 04/30/2016 [facility]-INTERV RAD   LEFT HEART CATH AND CORONARY ANGIOGRAPHY Left 10/06/2019   Procedure: LEFT HEART CATH AND CORONARY ANGIOGRAPHY;  Surgeon: Minna Merritts, MD;  Location: [facility];  Service: Cardiovascular;  Laterality: Left;    Prior to Admission medications   Medication Sig Start Date End Date Taking? Authorizing Provider   albuterol (VENTOLIN HFA) 108 (90 Base) MCG/ACT inhaler  01/23/20   [provider]  aspirin 81 MG tablet Take 81 mg by mouth daily.  09/02/13   Minna Merritts, MD  carvedilol (COREG) 12.5 MG tablet Take 1 tablet (12.5 mg total) by mouth 2 (two) times daily with a meal. 05/09/19   [place], Audie Clear, NP  clopidogrel (PLAVIX) 75 MG tablet Take 75 mg by mouth daily.    [provider]  ezetimibe (ZETIA) 10 MG tablet Take 1 tablet (10 mg total) by mouth daily. 03/08/19   Minna Merritts, MD  isosorbide mononitrate (IMDUR) 30 MG 24 hr tablet Take 1 tablet (30 mg total) by mouth daily. Take for BP 947 systolic or above, take half pill for BP 096 systolic or below 03/20/34   Loel Dubonnet, NP  nitroGLYCERIN (NITROSTAT) 0.4 MG SL tablet Place 1 tablet (0.4 mg total) under the tongue every 5 (five) minutes as needed for chest pain. 08/01/20   Minna Merritts, MD  pantoprazole (PROTONIX) 40 MG tablet Take 1 tablet (40 mg total) by mouth daily. 05/09/19   Kendell Bane, NP  rosuvastatin (CRESTOR) 40 MG tablet Take 1 tablet (40 mg total) by mouth daily. 09/15/19   Marrianne Mood D, PA-C  sertraline (ZOLOFT) 25 MG tablet Take by mouth. 01/09/20   [provider]  sertraline (ZOLOFT) 50 MG tablet Take 1 tablet by mouth daily. 07/12/20   [provider]  tamsulosin (FLOMAX) 0.4 MG CAPS capsule Take 1 capsule (0.4 mg total) by mouth daily. 05/09/19   Kendell Bane, NP    Allergies Peanut-containing drug products  Family History  Problem Relation Age of Onset   Cancer Mother    Hyperlipidemia Mother    Hypertension Mother    Cancer Father        stomach   Hyperlipidemia Father    Hypertension Father    Cancer Brother        bone cancer    Social History Social History   Tobacco Use   Smoking status: Former    Packs/day: 4.00    Years: 25.00    Pack years: 100.00    Types: Cigarettes    Quit date: 03/24/1981    Years since quitting: 39.6   Smokeless  tobacco: Never   Tobacco comments:    quit 30 years asgo  Vaping Use   Vaping Use: Never used  Substance Use Topics   Alcohol use: No   Drug use: No      Review of Systems Constitutional: No fever/chills Eyes: No visual changes. ENT: No sore throat. Cardiovascular: Positive chest pain Respiratory: Denies shortness of breath. Gastrointestinal: No abdominal pain.  No nausea, no vomiting.  No diarrhea.  No constipation. Genitourinary: Negative for dysuria. Musculoskeletal: Negative for back pain. Skin: Negative for rash. Neurological: Negative for headaches, focal weakness or numbness.  All other ROS negative ____________________________________________   PHYSICAL EXAM:  VITAL SIGNS: ED Triage Vitals [11/05/20 2140]  Enc Vitals Group     BP (!) 153/40     Pulse Rate 73     Resp 18     Temp 98 F (36.7 C)     Temp Source Oral     SpO2 98 %     Weight      Height      Head Circumference      Peak Flow      Pain Score      Pain Loc      Pain Edu?      Excl. in [place]?     Constitutional: Alert and oriented. Well appearing and in no acute distress. Eyes: Conjunctivae are normal. EOMI. Head: Atraumatic. Nose: No congestion/rhinnorhea. Mouth/Throat: Mucous membranes are moist.   Neck: No stridor. Trachea Midline. FROM Cardiovascular: Normal rate, regular rhythm. Grossly normal heart sounds.  Good peripheral circulation. Respiratory: Normal respiratory effort.  No retractions. Lungs CTAB. Gastrointestinal: Soft and nontender. No distention. No abdominal bruits.  Musculoskeletal: No lower extremity tenderness nor edema.  No joint effusions. Neurologic:  Normal speech and language. No gross focal neurologic deficits are appreciated.  Skin:  Skin is warm, dry and intact. No rash noted. Psychiatric: Mood and affect are normal. Speech and behavior are normal. GU: Deferred   ____________________________________________   LABS (all labs ordered are listed, but only  abnormal results are displayed)  Labs Reviewed  RESP PANEL BY RT-PCR (FLU A&B, COVID) ARPGX2  CBC WITH DIFFERENTIAL/PLATELET  COMPREHENSIVE METABOLIC PANEL  PROTIME-INR  APTT  TROPONIN I (HIGH SENSITIVITY)   ____________________________________________   ED ECG REPORT I, Vanessa [place], the attending physician, personally viewed and interpreted this ECG.  Normal sinus rate of 82, no ST elevation, T wave version V2 normal intervals, right bundle branch block ____________________________________________  RADIOLOGY I, Vanessa [place], personally viewed and evaluated these images (plain radiographs) as part of my medical decision making, as well as reviewing the written report by the radiologist.    ____________________________________________   PROCEDURES  Procedure(s) performed (including Critical Care):  Procedures   ____________________________________________   INITIAL IMPRESSION / ASSESSMENT AND PLAN / ED COURSE   Ahmere Hemenway Denne was evaluated in Emergency Department on 11/05/2020 for the symptoms described in the history of present illness. He was evaluated in the context of the global COVID-19 pandemic, which necessitated consideration that the patient might be at risk for infection with the SARS-CoV-2 virus that causes COVID-19. Institutional protocols and algorithms that pertain to the evaluation of patients at risk for COVID-19 are in a state of rapid change based on information released by regulatory bodies including the CDC and federal and state organizations. These policies and algorithms were followed during the patient's care in the ED.    Most Likely DDx:  -Given patient's history this is concerning for the potential of ACS.  On review of records patient had a catheterization with 60-70% stenosis of multivessel's.  Also consider the possibility of constipation but his abdomen does not really seem to be that tender right now but will get x-rays just to see if there  is any evidence of concern for free air /sbo   DDx that was also considered d/t potential to cause harm, but was found less likely based on history and physical (as detailed above): -PNA (no fevers, cough but CXR to evaluate) -PNX (reassured with equal b/l breath sounds, CXR to  evaluate) -Symptomatic anemia (will get H&H) -Pulmonary embolism as no sob at rest, not pleuritic in nature, no hypoxia -Aortic Dissection as no tearing pain and no radiation to the mid back, pulses equal -Pericarditis no rub on exam, EKG changes or hx to suggest dx -Tamponade (no notable SOB, tachycardic, hypotensive) -Esophageal rupture (no h/o diffuse vomitting/no crepitus)   Patient's pain came back and patient was given some IV morphine and IV zofran  Patient handed off to oncoming team pending labs and reevaluation.      ____________________________________________   FINAL CLINICAL IMPRESSION(S) / ED DIAGNOSES   Final diagnoses:  None     MEDICATIONS GIVEN DURING THIS VISIT:  Medications  morphine 4 MG/ML injection 4 mg (has no administration in time range)  ondansetron (ZOFRAN) injection 4 mg (has no administration in time range)     ED Discharge Orders     None        Note:  This document was prepared using Dragon voice recognition software and may include unintentional dictation errors.    Vanessa Mober[place], MD 11/05/20 [phone]

## 2020-11-05 NOTE — ED Triage Notes (Signed)
Patient reports chest pain x 2 weeks, worse tonight substernal; EMS gave 324 mg ASA, Nitroglycerin SL; patient denies pain at this time; Hx of Cardiac stents; skin warm/ dry; A/O x 4; respirations even non-labored

## 2020-11-05 NOTE — ED Provider Notes (Signed)
-----------------------------------------   10:58 PM on 11/05/2020 -----------------------------------------  Assuming care from Dr. Jari Pigg.  In short, ABDULKAREEM Abbott is a 75 y.o. male with a chief complaint of chest pain/heaviness.  Refer to the original H&P for additional details.  The current plan of care is to follow up on labs.   Anticipate admission for unstable angina / high risk chest pain.  Cardiologist is Dr. Rockey Situ.   ----------------------------------------- 11:50 PM on 11/05/2020 -----------------------------------------  Patient still having a little persistent chest pressure with intermittent sharp pain.  Initial troponin negative.  Given ongoing pain and high risk chest pain given known multivessel blockages, I talked to the patient about staying in the hospital since he easily meets definition of unstable angina.  Patient had full dose aspirin prior to arrival.  I am starting heparin bolus plus infusion and ordering 1 inch of nitroglycerin paste.  We can consider nitro infusion after that if necessary. Consulting hospitalist for admission.    ----------------------------------------- 12:23 AM on 11/06/2020 -----------------------------------------  Discussed with Dr. Sidney Abbott who will admit.   Ricky Kehr, MD 11/06/20 [phone]

## 2020-11-06 ENCOUNTER — Other Ambulatory Visit: Payer: Self-pay

## 2020-11-06 ENCOUNTER — Inpatient Hospital Stay (HOSPITAL_COMMUNITY)
Admission: AD | Admit: 2020-11-06 | Discharge: 2020-11-20 | DRG: 236 | Disposition: A | Discharge status: Home Health | Payer: Medicare Other | Source: Other Acute Inpatient Hospital | Attending: Thoracic Surgery (Cardiothoracic Vascular Surgery) | Admitting: Thoracic Surgery (Cardiothoracic Vascular Surgery)

## 2020-11-06 ENCOUNTER — Encounter
Admission: EM | Disposition: A | Discharge status: Other Acute Inpatient Hospital | Payer: Self-pay | Source: Home / Self Care | Attending: Internal Medicine

## 2020-11-06 DIAGNOSIS — Z79899 Other long term (current) drug therapy: Secondary | ICD-10-CM | POA: Diagnosis not present

## 2020-11-06 DIAGNOSIS — D62 Acute posthemorrhagic anemia: Secondary | ICD-10-CM | POA: Diagnosis not present

## 2020-11-06 DIAGNOSIS — E440 Moderate protein-calorie malnutrition: Secondary | ICD-10-CM | POA: Insufficient documentation

## 2020-11-06 DIAGNOSIS — Z809 Family history of malignant neoplasm, unspecified: Secondary | ICD-10-CM | POA: Diagnosis not present

## 2020-11-06 DIAGNOSIS — F4310 Post-traumatic stress disorder, unspecified: Secondary | ICD-10-CM | POA: Diagnosis present

## 2020-11-06 DIAGNOSIS — K5900 Constipation, unspecified: Secondary | ICD-10-CM

## 2020-11-06 DIAGNOSIS — Z7902 Long term (current) use of antithrombotics/antiplatelets: Secondary | ICD-10-CM

## 2020-11-06 DIAGNOSIS — Z8249 Family history of ischemic heart disease and other diseases of the circulatory system: Secondary | ICD-10-CM | POA: Diagnosis not present

## 2020-11-06 DIAGNOSIS — Z7982 Long term (current) use of aspirin: Secondary | ICD-10-CM | POA: Diagnosis not present

## 2020-11-06 DIAGNOSIS — D860 Sarcoidosis of lung: Secondary | ICD-10-CM | POA: Diagnosis present

## 2020-11-06 DIAGNOSIS — I10 Essential (primary) hypertension: Secondary | ICD-10-CM

## 2020-11-06 DIAGNOSIS — Z01810 Encounter for preprocedural cardiovascular examination: Secondary | ICD-10-CM | POA: Diagnosis not present

## 2020-11-06 DIAGNOSIS — R0789 Other chest pain: Secondary | ICD-10-CM | POA: Diagnosis present

## 2020-11-06 DIAGNOSIS — F32A Depression, unspecified: Secondary | ICD-10-CM | POA: Diagnosis present

## 2020-11-06 DIAGNOSIS — N400 Enlarged prostate without lower urinary tract symptoms: Secondary | ICD-10-CM | POA: Diagnosis present

## 2020-11-06 DIAGNOSIS — I252 Old myocardial infarction: Secondary | ICD-10-CM

## 2020-11-06 DIAGNOSIS — Z87891 Personal history of nicotine dependence: Secondary | ICD-10-CM | POA: Diagnosis not present

## 2020-11-06 DIAGNOSIS — Z91010 Allergy to peanuts: Secondary | ICD-10-CM

## 2020-11-06 DIAGNOSIS — E119 Type 2 diabetes mellitus without complications: Secondary | ICD-10-CM | POA: Diagnosis not present

## 2020-11-06 DIAGNOSIS — D869 Sarcoidosis, unspecified: Secondary | ICD-10-CM | POA: Diagnosis not present

## 2020-11-06 DIAGNOSIS — Z951 Presence of aortocoronary bypass graft: Secondary | ICD-10-CM

## 2020-11-06 DIAGNOSIS — I25110 Atherosclerotic heart disease of native coronary artery with unstable angina pectoris: Principal | ICD-10-CM | POA: Diagnosis present

## 2020-11-06 DIAGNOSIS — Z20822 Contact with and (suspected) exposure to covid-19: Secondary | ICD-10-CM | POA: Diagnosis not present

## 2020-11-06 DIAGNOSIS — E785 Hyperlipidemia, unspecified: Secondary | ICD-10-CM

## 2020-11-06 DIAGNOSIS — E1142 Type 2 diabetes mellitus with diabetic polyneuropathy: Secondary | ICD-10-CM | POA: Diagnosis not present

## 2020-11-06 DIAGNOSIS — J449 Chronic obstructive pulmonary disease, unspecified: Secondary | ICD-10-CM | POA: Diagnosis present

## 2020-11-06 DIAGNOSIS — Z6825 Body mass index (BMI) 25.0-25.9, adult: Secondary | ICD-10-CM

## 2020-11-06 DIAGNOSIS — Y831 Surgical operation with implant of artificial internal device as the cause of abnormal reaction of the patient, or of later complication, without mention of misadventure at the time of the procedure: Secondary | ICD-10-CM | POA: Diagnosis present

## 2020-11-06 DIAGNOSIS — K219 Gastro-esophageal reflux disease without esophagitis: Secondary | ICD-10-CM | POA: Diagnosis present

## 2020-11-06 DIAGNOSIS — R040 Epistaxis: Secondary | ICD-10-CM | POA: Diagnosis not present

## 2020-11-06 DIAGNOSIS — R252 Cramp and spasm: Secondary | ICD-10-CM | POA: Diagnosis not present

## 2020-11-06 DIAGNOSIS — E8770 Fluid overload, unspecified: Secondary | ICD-10-CM | POA: Diagnosis not present

## 2020-11-06 DIAGNOSIS — I200 Unstable angina: Secondary | ICD-10-CM | POA: Diagnosis not present

## 2020-11-06 DIAGNOSIS — D72829 Elevated white blood cell count, unspecified: Secondary | ICD-10-CM | POA: Diagnosis present

## 2020-11-06 DIAGNOSIS — J939 Pneumothorax, unspecified: Secondary | ICD-10-CM

## 2020-11-06 DIAGNOSIS — E1169 Type 2 diabetes mellitus with other specified complication: Secondary | ICD-10-CM | POA: Diagnosis not present

## 2020-11-06 DIAGNOSIS — R059 Cough, unspecified: Secondary | ICD-10-CM

## 2020-11-06 DIAGNOSIS — I4510 Unspecified right bundle-branch block: Secondary | ICD-10-CM | POA: Diagnosis present

## 2020-11-06 DIAGNOSIS — D6959 Other secondary thrombocytopenia: Secondary | ICD-10-CM | POA: Diagnosis not present

## 2020-11-06 DIAGNOSIS — M79661 Pain in right lower leg: Secondary | ICD-10-CM | POA: Diagnosis not present

## 2020-11-06 DIAGNOSIS — Z955 Presence of coronary angioplasty implant and graft: Secondary | ICD-10-CM

## 2020-11-06 DIAGNOSIS — Z01811 Encounter for preprocedural respiratory examination: Secondary | ICD-10-CM

## 2020-11-06 DIAGNOSIS — I214 Non-ST elevation (NSTEMI) myocardial infarction: Secondary | ICD-10-CM | POA: Diagnosis not present

## 2020-11-06 DIAGNOSIS — Z794 Long term (current) use of insulin: Secondary | ICD-10-CM

## 2020-11-06 DIAGNOSIS — Z83438 Family history of other disorder of lipoprotein metabolism and other lipidemia: Secondary | ICD-10-CM | POA: Diagnosis not present

## 2020-11-06 DIAGNOSIS — Z8619 Personal history of other infectious and parasitic diseases: Secondary | ICD-10-CM | POA: Diagnosis not present

## 2020-11-06 DIAGNOSIS — R079 Chest pain, unspecified: Secondary | ICD-10-CM | POA: Diagnosis present

## 2020-11-06 DIAGNOSIS — T82855A Stenosis of coronary artery stent, initial encounter: Secondary | ICD-10-CM | POA: Diagnosis not present

## 2020-11-06 DIAGNOSIS — I2510 Atherosclerotic heart disease of native coronary artery without angina pectoris: Secondary | ICD-10-CM | POA: Diagnosis present

## 2020-11-06 DIAGNOSIS — E7800 Pure hypercholesterolemia, unspecified: Secondary | ICD-10-CM | POA: Diagnosis not present

## 2020-11-06 HISTORY — PX: LEFT HEART CATH AND CORONARY ANGIOGRAPHY: CATH118249

## 2020-11-06 LAB — CBC
HCT: 43.9 % (ref 39.0–52.0)
Hemoglobin: 14.8 g/dL (ref 13.0–17.0)
MCH: 28.8 pg (ref 26.0–34.0)
MCHC: 33.7 g/dL (ref 30.0–36.0)
MCV: 85.4 fL (ref 80.0–100.0)
Platelets: 174 10*3/uL (ref 150–400)
RBC: 5.14 MIL/uL (ref 4.22–5.81)
RDW: 14.6 % (ref 11.5–15.5)
WBC: 7.6 10*3/uL (ref 4.0–10.5)
nRBC: 0 % (ref 0.0–0.2)

## 2020-11-06 LAB — CBG MONITORING, ED
Glucose-Capillary: 119 mg/dL — ABNORMAL HIGH (ref 70–99)
Glucose-Capillary: 135 mg/dL — ABNORMAL HIGH (ref 70–99)

## 2020-11-06 LAB — HEMOGLOBIN A1C
Hgb A1c MFr Bld: 6.2 % — ABNORMAL HIGH (ref 4.8–5.6)
Mean Plasma Glucose: 131 mg/dL

## 2020-11-06 LAB — LIPID PANEL
Cholesterol: 132 mg/dL (ref 0–200)
HDL: 36 mg/dL — ABNORMAL LOW (ref >40)
LDL Cholesterol: 67 mg/dL (ref 0–99)
Total CHOL/HDL Ratio: 3.7 RATIO
Triglycerides: 143 mg/dL (ref <150)
VLDL: 29 mg/dL (ref 0–40)

## 2020-11-06 LAB — GLUCOSE, CAPILLARY
Glucose-Capillary: 104 mg/dL — ABNORMAL HIGH (ref 70–99)
Glucose-Capillary: 191 mg/dL — ABNORMAL HIGH (ref 70–99)

## 2020-11-06 LAB — HEPARIN LEVEL (UNFRACTIONATED): Heparin Unfractionated: 0.59 IU/mL (ref 0.30–0.70)

## 2020-11-06 LAB — TROPONIN I (HIGH SENSITIVITY): Troponin I (High Sensitivity): 9 ng/L (ref <18)

## 2020-11-06 SURGERY — LEFT HEART CATH AND CORONARY ANGIOGRAPHY
Anesthesia: Moderate Sedation

## 2020-11-06 MED ORDER — HEPARIN (PORCINE) 25000 UT/250ML-% IV SOLN
1200.0000 [IU]/h | INTRAVENOUS | Status: DC
  Administered 2020-11-06: 1200 [IU]/h via INTRAVENOUS
  Filled 2020-11-06: qty 250

## 2020-11-06 MED ORDER — SERTRALINE HCL 100 MG PO TABS
100.0000 mg | ORAL_TABLET | Freq: Every day | ORAL | Status: DC
  Administered 2020-11-07 – 2020-11-20 (×14): 100 mg via ORAL
  Filled 2020-11-06 (×14): qty 1

## 2020-11-06 MED ORDER — LIDOCAINE HCL (PF) 1 % IJ SOLN
INTRAMUSCULAR | Status: DC | PRN
  Administered 2020-11-06: 2 mL

## 2020-11-06 MED ORDER — MORPHINE SULFATE (PF) 2 MG/ML IV SOLN: 2.0000 mg | INTRAVENOUS | 0 refills | Status: DC | PRN

## 2020-11-06 MED ORDER — ONDANSETRON HCL 4 MG/2ML IJ SOLN: 4.0000 mg | Freq: Four times a day (QID) | INTRAMUSCULAR | Status: DC | PRN

## 2020-11-06 MED ORDER — INSULIN ASPART 100 UNIT/ML IJ SOLN
0.0000 [IU] | Freq: Three times a day (TID) | INTRAMUSCULAR | Status: DC
  Administered 2020-11-09 – 2020-11-11 (×4): 2 [IU] via SUBCUTANEOUS
  Administered 2020-11-12: 3 [IU] via SUBCUTANEOUS
  Administered 2020-11-12 – 2020-11-13 (×3): 2 [IU] via SUBCUTANEOUS

## 2020-11-06 MED ORDER — ROSUVASTATIN CALCIUM 40 MG PO TABS: 40.0000 mg | ORAL_TABLET | Freq: Every day | ORAL | Status: DC

## 2020-11-06 MED ORDER — SODIUM CHLORIDE 0.9 % IV SOLN: 250.0000 mL | INTRAVENOUS | Status: DC | PRN

## 2020-11-06 MED ORDER — SODIUM CHLORIDE 0.9 % WEIGHT BASED INFUSION
3.0000 mL/kg/h | INTRAVENOUS | Status: DC
Start: 2020-11-07 — End: 2020-11-06
  Administered 2020-11-06: 3 mL/kg/h via INTRAVENOUS

## 2020-11-06 MED ORDER — MIDAZOLAM HCL 2 MG/2ML IJ SOLN
INTRAMUSCULAR | Status: AC
  Filled 2020-11-06: qty 2

## 2020-11-06 MED ORDER — HYDRALAZINE HCL 20 MG/ML IJ SOLN: 10.0000 mg | INTRAMUSCULAR | Status: DC | PRN

## 2020-11-06 MED ORDER — EZETIMIBE 10 MG PO TABS: 10.0000 mg | ORAL_TABLET | Freq: Every day | ORAL | Status: DC

## 2020-11-06 MED ORDER — HEPARIN BOLUS VIA INFUSION
4000.0000 [IU] | Freq: Once | INTRAVENOUS | Status: AC
  Administered 2020-11-06: 4000 [IU] via INTRAVENOUS
  Filled 2020-11-06: qty 4000

## 2020-11-06 MED ORDER — ROSUVASTATIN CALCIUM 20 MG PO TABS
40.0000 mg | ORAL_TABLET | Freq: Every day | ORAL | Status: DC
  Administered 2020-11-06: 40 mg via ORAL
  Filled 2020-11-06: qty 2

## 2020-11-06 MED ORDER — POTASSIUM CHLORIDE IN NACL 20-0.9 MEQ/L-% IV SOLN
INTRAVENOUS | Status: DC
  Filled 2020-11-06 (×2): qty 1000

## 2020-11-06 MED ORDER — ACETAMINOPHEN 325 MG PO TABS: 650.0000 mg | ORAL_TABLET | ORAL | Status: DC | PRN

## 2020-11-06 MED ORDER — SODIUM CHLORIDE 0.9% FLUSH: 3.0000 mL | Freq: Two times a day (BID) | INTRAVENOUS | Status: DC

## 2020-11-06 MED ORDER — INSULIN ASPART 100 UNIT/ML IJ SOLN: 0.0000 [IU] | Freq: Four times a day (QID) | INTRAMUSCULAR | Status: DC

## 2020-11-06 MED ORDER — ISOSORBIDE MONONITRATE ER 30 MG PO TB24
30.0000 mg | ORAL_TABLET | Freq: Every day | ORAL | Status: DC
Start: 2020-11-07 — End: 2020-11-07

## 2020-11-06 MED ORDER — TAMSULOSIN HCL 0.4 MG PO CAPS
0.4000 mg | ORAL_CAPSULE | Freq: Every day | ORAL | Status: DC
  Administered 2020-11-06: 0.4 mg via ORAL
  Filled 2020-11-06: qty 1

## 2020-11-06 MED ORDER — PANTOPRAZOLE SODIUM 40 MG PO TBEC
40.0000 mg | DELAYED_RELEASE_TABLET | Freq: Every day | ORAL | Status: DC
  Administered 2020-11-07 – 2020-11-13 (×7): 40 mg via ORAL
  Filled 2020-11-06 (×7): qty 1

## 2020-11-06 MED ORDER — EZETIMIBE 10 MG PO TABS
10.0000 mg | ORAL_TABLET | Freq: Every day | ORAL | Status: DC
  Administered 2020-11-06: 10 mg via ORAL
  Filled 2020-11-06: qty 1

## 2020-11-06 MED ORDER — HEPARIN (PORCINE) 25000 UT/250ML-% IV SOLN
1400.0000 [IU]/h | INTRAVENOUS | Status: DC
  Administered 2020-11-06 – 2020-11-10 (×6): 1200 [IU]/h via INTRAVENOUS
  Administered 2020-11-11: 1250 [IU]/h via INTRAVENOUS
  Administered 2020-11-12 – 2020-11-13 (×2): 1400 [IU]/h via INTRAVENOUS
  Filled 2020-11-06 (×8): qty 250

## 2020-11-06 MED ORDER — LIDOCAINE VISCOUS HCL 2 % MT SOLN
15.0000 mL | Freq: Once | OROMUCOSAL | Status: AC
  Administered 2020-11-06: 15 mL via ORAL
  Filled 2020-11-06: qty 15

## 2020-11-06 MED ORDER — ISOSORBIDE MONONITRATE ER 30 MG PO TB24: 30.0000 mg | ORAL_TABLET | Freq: Every day | ORAL | Status: DC

## 2020-11-06 MED ORDER — INSULIN ASPART 100 UNIT/ML IJ SOLN
0.0000 [IU] | Freq: Every day | INTRAMUSCULAR | Status: DC
Start: 2020-11-06 — End: 2020-11-13

## 2020-11-06 MED ORDER — MORPHINE SULFATE (PF) 2 MG/ML IV SOLN
2.0000 mg | INTRAVENOUS | Status: DC | PRN
Start: 2020-11-06 — End: 2020-11-06

## 2020-11-06 MED ORDER — PANTOPRAZOLE SODIUM 40 MG PO TBEC
40.0000 mg | DELAYED_RELEASE_TABLET | Freq: Every day | ORAL | Status: DC
  Administered 2020-11-06: 40 mg via ORAL
  Filled 2020-11-06: qty 1

## 2020-11-06 MED ORDER — ACETAMINOPHEN 325 MG PO TABS
650.0000 mg | ORAL_TABLET | ORAL | Status: DC | PRN
  Administered 2020-11-10 – 2020-11-12 (×3): 650 mg via ORAL
  Filled 2020-11-06 (×3): qty 2

## 2020-11-06 MED ORDER — SENNOSIDES-DOCUSATE SODIUM 8.6-50 MG PO TABS: 1.0000 | ORAL_TABLET | Freq: Two times a day (BID) | ORAL | Status: DC

## 2020-11-06 MED ORDER — FLEET ENEMA 7-19 GM/118ML RE ENEM: 1.0000 | ENEMA | Freq: Every day | RECTAL | Status: DC | PRN

## 2020-11-06 MED ORDER — TAMSULOSIN HCL 0.4 MG PO CAPS: 0.4000 mg | ORAL_CAPSULE | Freq: Every day | ORAL | Status: DC

## 2020-11-06 MED ORDER — MAGNESIUM HYDROXIDE 400 MG/5ML PO SUSP
15.0000 mL | Freq: Every day | ORAL | Status: DC | PRN
  Administered 2020-11-06 – 2020-11-07 (×2): 15 mL via ORAL
  Filled 2020-11-06 (×2): qty 30

## 2020-11-06 MED ORDER — NITROGLYCERIN 0.4 MG SL SUBL: 0.4000 mg | SUBLINGUAL_TABLET | SUBLINGUAL | Status: DC | PRN

## 2020-11-06 MED ORDER — CARVEDILOL 6.25 MG PO TABS
12.5000 mg | ORAL_TABLET | Freq: Two times a day (BID) | ORAL | Status: DC
  Administered 2020-11-06: 12.5 mg via ORAL
  Filled 2020-11-06: qty 2

## 2020-11-06 MED ORDER — HEPARIN (PORCINE) IN NACL 1000-0.9 UT/500ML-% IV SOLN
INTRAVENOUS | Status: AC
  Filled 2020-11-06: qty 1000

## 2020-11-06 MED ORDER — ONDANSETRON HCL 4 MG/2ML IJ SOLN
4.0000 mg | Freq: Four times a day (QID) | INTRAMUSCULAR | Status: DC | PRN
  Administered 2020-11-09: 4 mg via INTRAVENOUS
  Filled 2020-11-06: qty 2

## 2020-11-06 MED ORDER — MIDAZOLAM HCL 2 MG/2ML IJ SOLN
INTRAMUSCULAR | Status: DC | PRN
  Administered 2020-11-06: 1 mg via INTRAVENOUS

## 2020-11-06 MED ORDER — SERTRALINE HCL 50 MG PO TABS: 50.0000 mg | ORAL_TABLET | Freq: Every day | ORAL | Status: DC

## 2020-11-06 MED ORDER — IOHEXOL 350 MG/ML SOLN
INTRAVENOUS | Status: DC | PRN
  Administered 2020-11-06: 68 mL

## 2020-11-06 MED ORDER — FENTANYL CITRATE (PF) 100 MCG/2ML IJ SOLN
INTRAMUSCULAR | Status: DC | PRN
  Administered 2020-11-06: 25 ug via INTRAVENOUS

## 2020-11-06 MED ORDER — SERTRALINE HCL 50 MG PO TABS
100.0000 mg | ORAL_TABLET | Freq: Every day | ORAL | Status: DC
  Administered 2020-11-06: 100 mg via ORAL
  Filled 2020-11-06: qty 2

## 2020-11-06 MED ORDER — ASPIRIN EC 81 MG PO TBEC
81.0000 mg | DELAYED_RELEASE_TABLET | Freq: Every day | ORAL | Status: DC
  Administered 2020-11-06: 81 mg via ORAL
  Filled 2020-11-06: qty 1

## 2020-11-06 MED ORDER — PANTOPRAZOLE SODIUM 40 MG PO TBEC: 40.0000 mg | DELAYED_RELEASE_TABLET | Freq: Every day | ORAL | Status: DC

## 2020-11-06 MED ORDER — CARVEDILOL 12.5 MG PO TABS: 12.5000 mg | ORAL_TABLET | Freq: Two times a day (BID) | ORAL | Status: DC

## 2020-11-06 MED ORDER — EZETIMIBE 10 MG PO TABS
10.0000 mg | ORAL_TABLET | Freq: Every day | ORAL | Status: DC
  Administered 2020-11-07 – 2020-11-20 (×14): 10 mg via ORAL
  Filled 2020-11-06 (×14): qty 1

## 2020-11-06 MED ORDER — SENNOSIDES-DOCUSATE SODIUM 8.6-50 MG PO TABS
1.0000 | ORAL_TABLET | Freq: Two times a day (BID) | ORAL | Status: DC
  Administered 2020-11-07 – 2020-11-13 (×11): 1 via ORAL
  Filled 2020-11-06 (×13): qty 1

## 2020-11-06 MED ORDER — ASPIRIN EC 81 MG PO TBEC
81.0000 mg | DELAYED_RELEASE_TABLET | Freq: Every day | ORAL | Status: DC
  Administered 2020-11-07 – 2020-11-13 (×7): 81 mg via ORAL
  Filled 2020-11-06 (×7): qty 1

## 2020-11-06 MED ORDER — POLYETHYLENE GLYCOL 3350 17 G PO PACK: 17.0000 g | PACK | Freq: Every day | ORAL | 0 refills | Status: DC

## 2020-11-06 MED ORDER — SERTRALINE HCL 100 MG PO TABS: 100.0000 mg | ORAL_TABLET | Freq: Every day | ORAL | Status: DC

## 2020-11-06 MED ORDER — SODIUM CHLORIDE 0.9 % WEIGHT BASED INFUSION
1.0000 mL/kg/h | INTRAVENOUS | Status: DC
  Administered 2020-11-06: 1 mL/kg/h via INTRAVENOUS

## 2020-11-06 MED ORDER — FLEET ENEMA 7-19 GM/118ML RE ENEM: 1.0000 | ENEMA | Freq: Every day | RECTAL | 0 refills | Status: DC | PRN

## 2020-11-06 MED ORDER — ASPIRIN 81 MG PO TBEC: 81.0000 mg | DELAYED_RELEASE_TABLET | Freq: Every day | ORAL | 11 refills | Status: AC

## 2020-11-06 MED ORDER — HEPARIN (PORCINE) 25000 UT/250ML-% IV SOLN
1200.0000 [IU]/h | INTRAVENOUS | Status: DC
  Administered 2020-11-06: 1200 [IU]/h via INTRAVENOUS

## 2020-11-06 MED ORDER — FENTANYL CITRATE (PF) 100 MCG/2ML IJ SOLN
INTRAMUSCULAR | Status: AC
  Filled 2020-11-06: qty 2

## 2020-11-06 MED ORDER — HEPARIN SODIUM (PORCINE) 1000 UNIT/ML IJ SOLN
INTRAMUSCULAR | Status: AC
  Filled 2020-11-06: qty 1

## 2020-11-06 MED ORDER — ALBUTEROL SULFATE (2.5 MG/3ML) 0.083% IN NEBU: 2.5000 mg | INHALATION_SOLUTION | RESPIRATORY_TRACT | 12 refills | Status: DC | PRN

## 2020-11-06 MED ORDER — ROSUVASTATIN CALCIUM 20 MG PO TABS
40.0000 mg | ORAL_TABLET | Freq: Every day | ORAL | Status: DC
  Administered 2020-11-07 – 2020-11-20 (×14): 40 mg via ORAL
  Filled 2020-11-06 (×14): qty 2

## 2020-11-06 MED ORDER — VERAPAMIL HCL 2.5 MG/ML IV SOLN
INTRAVENOUS | Status: AC
  Filled 2020-11-06: qty 2

## 2020-11-06 MED ORDER — ISOSORBIDE MONONITRATE ER 60 MG PO TB24
30.0000 mg | ORAL_TABLET | Freq: Every day | ORAL | Status: DC
  Administered 2020-11-06: 30 mg via ORAL
  Filled 2020-11-06: qty 1

## 2020-11-06 MED ORDER — ALUM & MAG HYDROXIDE-SIMETH 200-200-20 MG/5ML PO SUSP
30.0000 mL | Freq: Once | ORAL | Status: AC
  Administered 2020-11-06: 30 mL via ORAL
  Filled 2020-11-06: qty 30

## 2020-11-06 MED ORDER — CLOPIDOGREL BISULFATE 75 MG PO TABS
75.0000 mg | ORAL_TABLET | Freq: Every day | ORAL | Status: DC
  Administered 2020-11-06: 75 mg via ORAL
  Filled 2020-11-06: qty 1

## 2020-11-06 MED ORDER — LABETALOL HCL 5 MG/ML IV SOLN: 10.0000 mg | INTRAVENOUS | Status: DC | PRN

## 2020-11-06 MED ORDER — HEPARIN SODIUM (PORCINE) 1000 UNIT/ML IJ SOLN
INTRAMUSCULAR | Status: DC | PRN
  Administered 2020-11-06: 4500 [IU] via INTRAVENOUS

## 2020-11-06 MED ORDER — HEPARIN (PORCINE) IN NACL 1000-0.9 UT/500ML-% IV SOLN
INTRAVENOUS | Status: DC | PRN
  Administered 2020-11-06: 1000 mL

## 2020-11-06 MED ORDER — SODIUM CHLORIDE 0.9% FLUSH
3.0000 mL | INTRAVENOUS | Status: DC | PRN
Start: 2020-11-06 — End: 2020-11-06

## 2020-11-06 MED ORDER — POLYETHYLENE GLYCOL 3350 17 G PO PACK: 17.0000 g | PACK | Freq: Every day | ORAL | Status: DC

## 2020-11-06 MED ORDER — VERAPAMIL HCL 2.5 MG/ML IV SOLN
INTRAVENOUS | Status: DC | PRN
  Administered 2020-11-06: 2.5 mg via INTRA_ARTERIAL

## 2020-11-06 MED ORDER — ALBUTEROL SULFATE (2.5 MG/3ML) 0.083% IN NEBU: 2.5000 mg | INHALATION_SOLUTION | RESPIRATORY_TRACT | Status: DC | PRN

## 2020-11-06 MED ORDER — ALPRAZOLAM 0.5 MG PO TABS: 0.2500 mg | ORAL_TABLET | Freq: Two times a day (BID) | ORAL | Status: DC | PRN

## 2020-11-06 MED ORDER — SODIUM CHLORIDE 0.9% FLUSH: 3.0000 mL | INTRAVENOUS | Status: DC | PRN

## 2020-11-06 MED ORDER — ZOLPIDEM TARTRATE 5 MG PO TABS: 5.0000 mg | ORAL_TABLET | Freq: Every evening | ORAL | Status: DC | PRN

## 2020-11-06 MED ORDER — ZOLPIDEM TARTRATE 5 MG PO TABS: 5.0000 mg | ORAL_TABLET | Freq: Every evening | ORAL | 0 refills | Status: DC | PRN

## 2020-11-06 MED ORDER — TAMSULOSIN HCL 0.4 MG PO CAPS
0.4000 mg | ORAL_CAPSULE | Freq: Every day | ORAL | Status: DC
  Administered 2020-11-07 – 2020-11-19 (×12): 0.4 mg via ORAL
  Filled 2020-11-06 (×12): qty 1

## 2020-11-06 MED ORDER — NITROGLYCERIN 0.4 MG SL SUBL
0.4000 mg | SUBLINGUAL_TABLET | SUBLINGUAL | Status: DC | PRN
Start: 2020-11-06 — End: 2020-11-13
  Administered 2020-11-07 (×5): 0.4 mg via SUBLINGUAL
  Filled 2020-11-06 (×2): qty 1

## 2020-11-06 MED ORDER — CARVEDILOL 12.5 MG PO TABS
12.5000 mg | ORAL_TABLET | Freq: Two times a day (BID) | ORAL | Status: DC
  Administered 2020-11-07 – 2020-11-13 (×13): 12.5 mg via ORAL
  Filled 2020-11-06 (×13): qty 1

## 2020-11-06 MED ORDER — NITROGLYCERIN 0.4 MG SL SUBL: 0.4000 mg | SUBLINGUAL_TABLET | SUBLINGUAL | 12 refills | Status: DC | PRN

## 2020-11-06 MED ORDER — LIDOCAINE HCL 1 % IJ SOLN
INTRAMUSCULAR | Status: AC
  Filled 2020-11-06: qty 20

## 2020-11-06 MED ORDER — SODIUM CHLORIDE 0.9 % IV SOLN: INTRAVENOUS | Status: DC

## 2020-11-06 SURGICAL SUPPLY — 14 items
CATH 5F 110X4 TIG (CATHETERS) ×1 IMPLANT
CATH INFINITI 5FR ANG PIGTAIL (CATHETERS) ×1 IMPLANT
CATH INFINITI JR4 5F (CATHETERS) ×1 IMPLANT
DEVICE RAD TR BAND REGULAR (VASCULAR PRODUCTS) ×1 IMPLANT
DRAPE BRACHIAL (DRAPES) ×1 IMPLANT
GLIDESHEATH SLEND SS 6F .021 (SHEATH) ×1 IMPLANT
GUIDEWIRE INQWIRE 1.5J.035X260 (WIRE) IMPLANT
INQWIRE 1.5J .035X260CM (WIRE) ×2
KIT ENCORE 26 ADVANTAGE (KITS) ×1 IMPLANT
PACK CARDIAC CATH (CUSTOM PROCEDURE TRAY) ×2 IMPLANT
PROTECTION STATION PRESSURIZED (MISCELLANEOUS) ×2
SET ATX SIMPLICITY (MISCELLANEOUS) ×1 IMPLANT
STATION PROTECTION PRESSURIZED (MISCELLANEOUS) IMPLANT
WIRE G HI TQ BMW 190 (WIRE) ×1 IMPLANT

## 2020-11-06 NOTE — H&P (Addendum)
Cardiology Admission History and Physical:   Patient ID: Ricky Abbott MRN: [id number]; DOB: [birth year]   Admission date: 11/06/2020  PCP:  Ricky Burrow, MD   [facility] Providers Cardiologist:  Ricky Rogue, MD   {    Chief Complaint: Chest pain  Patient Profile:   Ricky Abbott is a 75 y.o. male with CAD s/p remote PCI to the RCA with MI in 2014 s/p PCI x2 to the LAD in 2014, DM2, HTN, HLD, hepatitis, sarcoidosis of the lung, COPD, prior tobacco use quitting in 1984, PTSD with 3 tours in [place], EtOH, and depression who is being seen 11/06/2020 for the evaluation of unstable angina with multivessel CAD noted on LHC.  History of Present Illness:   Ricky Abbott was seen in 08/2019 for preoperative cardiac evaluation for colonoscopy and hernia surgery and noted exertional dyspnea. In this setting, he underwent Lexiscan MPI on 09/22/2019 that showed mild to moderate ischemia in the inferior wall with prior notes indicating a predominantly fixed perfusion defect in the inferior wall on stress test imaging in 2015. EF 63%. He subsequently underwent LHC on 10/06/2019 showed ostial to mid left main 60% stenosis, ostial to proximal LAD 70% in-stent restenosis, proximal to mid LAD 60% heavily calcified stenosis, and 70% OM/ramus stenosis, nondominant RCA that was small with proximal 80% stenosis. Medical management was recommended.    ABIs normal bilaterally in 09/2019.    He reports an approximate year long history of worsening exertional angina, dyspnea, and fatigue that has progressively gotten worse with symptoms of angina now occurring at rest over the past couple of weeks. He reports having to stop to rest when ambulating to the laundry mat, which is about 50 yards away from his house. Symptoms of angina have been occurring at nighttime, when laying down and have previously improved with SL NTG, though they would frequently reoccur prompting him to take more SL NTG. Wellness  nurse contacted our office, and he was advised to go to the ED earlier this month. He declined. He has ran out of SL NTG and have been taking an old prescription. On the night of 9/26, he was watching TV when he developed substernal chest pain that radiated to the right shoulder and was associated with diaphoresis. He is uncertain if this pain felt similar to his prior MI. Symptoms did not improve after several SL NTG. Given persistent symptoms, he contacted EMS and was brought to the ED. He received ASA 324 mg in the field prior to arrival. EKG upon arrival showed sinus rhythm with a known RBBB. HS-Tn 5 with a delta troponin of 9. Plain films were not acute. In the ED, he received Maalox, morphine, nitropaste, and was placed on a heparin gtt.  At time of cardiology consult at [facility], he was without chest pain.  He underwent LHC on 11/06/2020 which demonstrated severe three-vessel CAD as detailed below with normal LV systolic function with mid and apical inferior hypokinesis and mildly elevated LVEDP.  Recommendation was made to transfer the patient to [facility] for evaluation of CABG by CVTS and initiate heparin drip 2 hours post TR band removal, which would be approximately 1700 on 9/27.  The patient did receive clopidogrel 75 mg in the [facility] ED on the morning of 9/27.    Past Medical History:  Diagnosis Date   Allergy    Anxiety    Asthma    CAD, multiple vessel    COPD (chronic obstructive pulmonary disease) ([place])  Depression    Diabetes mellitus without complication (HCC)    Dyspnea    GERD (gastroesophageal reflux disease)    History of chicken pox    Hyperlipidemia    Hypertension    MI (myocardial infarction) ([place])    Prostate enlargement    PTSD (post-traumatic stress disorder)    [place] Vet   Sarcoidosis of lung ([place])     Past Surgical History:  Procedure Laterality Date   billary tube placement     [place], [place] N/A 06/05/2016   Procedure: LAPAROSCOPIC CHOLECYSTECTOMY with cholangiogram;  Surgeon: Jules Husbands, MD;  Location: [facility] ORS;  Service: General;  Laterality: N/A;   [place], 05/2012   x 2   IR GENERIC HISTORICAL  04/30/2016   IR PERC CHOLECYSTOSTOMY 04/30/2016 [facility]-INTERV RAD   LEFT HEART CATH AND CORONARY ANGIOGRAPHY Left 10/06/2019   Procedure: LEFT HEART CATH AND CORONARY ANGIOGRAPHY;  Surgeon: Minna Merritts, MD;  Location: [place] CV LAB;  Service: Cardiovascular;  Laterality: Left;     Medications Prior to Admission: Prior to Admission medications   Medication Sig Start Date Ricky Abbott Date Taking? Authorizing Provider  albuterol (VENTOLIN HFA) 108 (90 Base) MCG/ACT inhaler  01/23/20   [provider]  aspirin 81 MG tablet Take 81 mg by mouth daily.  09/02/13   Minna Merritts, MD  carvedilol (COREG) 12.5 MG tablet Take 1 tablet (12.5 mg total) by mouth 2 (two) times daily with a meal. 05/09/19   Scarboro, Audie Clear, NP  clopidogrel (PLAVIX) 75 MG tablet Take 75 mg by mouth daily.    [provider]  ezetimibe (ZETIA) 10 MG tablet Take 1 tablet (10 mg total) by mouth daily. 03/08/19   Minna Merritts, MD  isosorbide mononitrate (IMDUR) 30 MG 24 hr tablet Take 1 tablet (30 mg total) by mouth daily. Take for BP 681 systolic or above, take half pill for BP 157 systolic or below 03/18/18   Loel Dubonnet, NP  nitroGLYCERIN (NITROSTAT) 0.4 MG SL tablet Place 1 tablet (0.4 mg total) under the tongue every 5 (five) minutes as needed for chest pain. 08/01/20   Minna Merritts, MD  pantoprazole (PROTONIX) 40 MG tablet Take 1 tablet (40 mg total) by mouth daily. 05/09/19   Kendell Bane, NP  rosuvastatin (CRESTOR) 40 MG tablet Take 1 tablet (40 mg total) by mouth daily. 09/15/19   Marrianne Mood D, PA-C  sertraline (ZOLOFT) 100 MG tablet Take 100 mg by mouth daily. 10/10/20   [provider]   sertraline (ZOLOFT) 25 MG tablet Take by mouth. Patient not taking: Reported on 11/06/2020 01/09/20   [provider]  sertraline (ZOLOFT) 50 MG tablet Take 1 tablet by mouth daily. Patient not taking: Reported on 11/06/2020 07/12/20   [provider]  tamsulosin (FLOMAX) 0.4 MG CAPS capsule Take 1 capsule (0.4 mg total) by mouth daily. Patient not taking: Reported on 11/06/2020 05/09/19   Kendell Bane, NP     Allergies:    Allergies  Allergen Reactions   Peanut-Containing Drug Products Swelling    Swelling, rash, (Peanuts)    Social History:   Social History   Socioeconomic History   Marital status: Single    Spouse name: Not on file   Number of children: Not on file   Years of education: Not on file  Highest education level: Not on file  Occupational History   Not on file  Tobacco Use   Smoking status: Former    Packs/day: 4.00    Years: 25.00    Pack years: 100.00    Types: Cigarettes    Quit date: 03/24/1981    Years since quitting: 39.6   Smokeless tobacco: Never   Tobacco comments:    quit 30 years asgo  Vaping Use   Vaping Use: Never used  Substance and Sexual Activity   Alcohol use: No   Drug use: No   Sexual activity: Never  Other Topics Concern   Not on file  Social History Narrative   Yvone Neu grew up in [place], [place]. He moved to the [place] area in 2013. 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   Social Determinants of Health   Financial Resource Strain: Not on file  Food Insecurity: Not on file  Transportation Needs: Not on file  Physical Activity: Not on file  Stress: Not on file  Social Connections: Not on file  Intimate Partner Violence: Not on file    Family History:   The patient's family history includes Cancer in his brother, father, and mother; Hyperlipidemia in his father and mother; Hypertension in his father and mother.    ROS:  Please see the history of present illness.  All other ROS reviewed and negative.      Physical Exam/Data:   Vitals:    11/06/20 0207 11/06/20 0400 11/06/20 0600 11/06/20 0900  BP: (!) 147/71 (!) 128/56 (!) 145/67 135/60  Pulse: 72 68 67 68  Resp: 16 16 16     Temp:          TempSrc:          SpO2: 95% 96% 96%    Weight:          Height:            No intake or output data in the 24 hours ending 11/06/20 1022    Filed Weights    11/05/20 2239  Weight: 88 kg    Body mass index is 27.84 kg/m.    General:  Well nourished, well developed, in no acute distress HEENT: Normal Neck: No JVD Vascular: No carotid bruits; Distal pulses 2+ bilaterally   Cardiac:  Normal S1, S2; RRR; I/VI systolic murmur Lungs:  Clear to auscultation bilaterally, no wheezing, rhonchi or rales  Abd: Soft, nontender, no hepatomegaly  Ext: No edema Musculoskeletal:  No deformities, BUE and BLE strength normal and equal Skin: Warm and dry  Neuro:  CNs 2-12 intact, no focal abnormalities noted Psych:  Normal affect    EKG:  The ECG that was done 11/07/2018 was personally reviewed and demonstrates NSR, 82 bpm, RBBB, anterior T wave version  Relevant CV Studies:  LHC 11/06/2020: Conclusions: Severe three-vessel coronary artery disease, as detailed below. Low normal left ventricular systolic function (LVEF 40-98%) with mid and apical inferior hypokinesis.  Mildly elevated left ventricular filling pressure (LVEDP 20-25 mmHg).   Recommendations: Transfer to [facility] for cardiac surgery consultation. Hold clopidogrel pending surgical consultation. Restart IV heparin 2 hours after TR band removal. Aggressive secondary prevention.   Diagnostic Dominance: Co-dominant Left Main  Vessel is large.  Mid LM to Dist LM lesion is 55% stenosed. The lesion is eccentric.  Left Anterior Descending  Vessel is large.  Ost LAD lesion is 75% stenosed.  Prox LAD lesion is 90% stenosed. The lesion was previously treated using a stent (unknown type) over  2 years ago. Previously placed stent displays  restenosis.  Mid LAD lesion is 70% stenosed. 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  Second Diagonal Branch  Vessel is small in size.  Ramus Intermedius  Vessel is moderate in size.  Ramus lesion is 50% stenosed.  Left Circumflex  Vessel is large.  Prox Cx lesion is 55% stenosed. The lesion is eccentric.  First Obtuse Marginal Branch  Vessel is small in size.  1st Mrg lesion is 90% stenosed.  Second Obtuse Marginal Branch  Vessel is small in size. There is mild disease in the vessel.  First Left Posterolateral Branch  Vessel is small in size.  Second Left Posterolateral Branch  Vessel is small in size.  2nd LPL lesion is 30% stenosed.  Third Left Posterolateral Branch  Vessel is small in size.  Right Coronary Artery  Vessel is moderate in size.  Ost RCA to Prox RCA lesion is 70% stenosed. The lesion was previously treated using a stent (unknown type) over 2 years ago.  Mid RCA lesion is 50% stenosed.  Right Posterior Descending Artery  Vessel is small in size.  Intervention  No interventions have been documented. Wall Motion              Left Heart  Left Ventricle The left ventricular size is normal. The left ventricular systolic function is normal. LV Aaren Krog diastolic pressure is mildly elevated. LVEDP 20-25 mmHg. The left ventricular ejection fraction is 50-55% by visual estimate. There are LV function abnormalities due to segmental dysfunction.  Aortic Valve There is no aortic valve stenosis.   Coronary Diagrams  Diagnostic Dominance: Co-dominant  __________  2D echo 10/07/2019: 1. Left ventricular ejection fraction, by estimation, is 60 to 65%. The  left ventricle has normal function. The left ventricle has no regional  wall motion abnormalities. Left ventricular diastolic parameters are  consistent with Grade I diastolic  dysfunction (impaired relaxation).   2. Right ventricular systolic function is normal. The right ventricular  size is normal.  Tricuspid regurgitation signal is inadequate for assessing  PA pressure.   3. Mild mitral valve regurgitation. __________   LHC 10/06/2019: Coronary dominance: Left  Left mainstem:   Large vessel that bifurcates into the LAD and left circumflex, 60% mid to distal left main disease  Left anterior descending (LAD):   Large vessel that extends to the apical region, diagonal branch 2 of moderate size, stent, possibly 2 sequential stents in the proximal LAD with in-stent restenosis estimated at 70%, 60% disease of the LAD after D1, heavily calcified, diffuse disease  Left circumflex (LCx):  Large vessel with OM branch 2, high OM/ramus small vessel with severe disease estimated 80% throughout, mild diffuse disease of the left circumflex  Right coronary artery (RCA): Nondominant vessel, small caliber throughout, heavily diseased, severe proximal disease estimated 80%  Left ventriculography: Left ventricular systolic function is normal, LVEF is estimated at 55%, there is no significant mitral regurgitation , no significant aortic valve stenosis  Final Conclusions:   Moderate disease of mid to distal left main, Moderate to severe disease of proximal to the mid LAD, in-stent restenosis Left dominant with small diffusely diseased RCA Grossly normal EF  Recommendations:  Case discussed with interventional cardiology, given very mild symptoms,  Will pursue medical management Start isosorbide for blood pressure control, For any worsening anginal symptoms may need to consider CABG with LIMA to the LAD The above was discussed with him in detail __________   Carlton Adam MPI  09/22/2019: Pharmacological myocardial perfusion imaging study with mild to moderate ischemia in the inferior wall Unable to exclude diaphragmatic attenuation artifact Normal wall motion, EF estimated at 63% No EKG changes concerning for ischemia at peak stress or in recovery. CT attenuation correction images were  unavailable Moderate risk scan Patient notes indicating previously noted predominantly fixed perfusion defect in the inferior wall on stress imaging 2015 Clinical correlation recommended.  Could consider alternate form of imaging such as cardiac CTA versus catheterization if symptoms indicate. __________   2D echo 04/28/2016: - Left ventricle: The cavity size was at the upper limits of    normal. Wall thickness was at the upper limits of normal.    Systolic function was normal. The estimated ejection fraction was    in the range of 55% to 60%. Wall motion was normal; there were no    regional wall motion abnormalities. Doppler parameters are    consistent with abnormal left ventricular relaxation (grade 1    diastolic dysfunction).  - Left atrium: The atrium was mildly dilated.  - Right ventricle: The cavity size was normal. Systolic function    was normal.    Laboratory Data:  High Sensitivity Troponin:   Recent Labs  Lab 11/05/20 2152 11/06/20 0003  TROPONINIHS 5 9      Chemistry Recent Labs  Lab 11/05/20 2152  NA 139  K 3.6  CL 108  CO2 24  GLUCOSE 108*  BUN 33*  CREATININE 0.82  CALCIUM 9.3  GFRNONAA >60  ANIONGAP 7    Recent Labs  Lab 11/05/20 2152  PROT 7.2  ALBUMIN 4.1  AST 26  ALT 30  ALKPHOS 75  BILITOT 1.0   Lipids  Recent Labs  Lab 11/06/20 0003  CHOL 132  TRIG 143  HDL 36*  LDLCALC 67  CHOLHDL 3.7   Hematology Recent Labs  Lab 11/05/20 2152 11/06/20 0857  WBC 7.7 7.6  RBC 4.78 5.14  HGB 14.1 14.8  HCT 40.8 43.9  MCV 85.4 85.4  MCH 29.5 28.8  MCHC 34.6 33.7  RDW 14.4 14.6  PLT 173 174   Thyroid No results for input(s): TSH, FREET4 in the last 168 hours. BNPNo results for input(s): BNP, PROBNP in the last 168 hours.  DDimer No results for input(s): DDIMER in the last 168 hours.   Radiology/Studies:  DG ABD ACUTE 2+V W 1V CHEST  Result Date: 11/05/2020 IMPRESSION: Negative abdominal radiographs.  No acute cardiopulmonary  disease. Electronically Signed   By: Fidela Salisbury M.D.   On: 11/05/2020 22:49     Assessment and Plan:   CAD involving the native coronary arteries with unstable angina: -Currently chest pain free, high sensitivity troponin negative x 2 -Prior cath films from 09/2019 reviewed with MD -LHC at [facility] demonstrated progression of multivessel disease as outlined above with recommendation to transfer to [facility] for consideration of CABG by CVTS -Last dose of Plavix 75 mg was the morning of 11/06/2020 in the ED, clopidogrel has been held -ASA -Echo pending -Check lipid panel and A1c for further risk stratification  -NPO -Coreg, Imdur, Crestor, Zetia -Resume heparin drip 2 hours post TR band removal which will be approximately 1700 on 11/06/2020   2.   HTN:  -Blood pressure well controlled -PTA Coreg   3.   HLD:  -LDL 67 at [facility] this admission -Continue rosuvastatin 40 mg and Zetia 10 mg   4.   DM2: -Updated A1c pending with a value of 6.5 in 05/2019 -SSI per  IM   5.   PTSD: -PTA Zoloft    6.   GERD: -PTA Protonix    Risk Assessment/Risk Scores:   TIMI Risk Score for Unstable Angina or Non-ST Elevation MI:   The patient's TIMI risk score is 6, which indicates a 41% risk of all cause mortality, new or recurrent myocardial infarction or need for urgent revascularization in the next 14 days.       Severity of Illness: The appropriate patient status for this patient is INPATIENT. Inpatient status is judged to be reasonable and necessary in order to provide the required intensity of service to ensure the patient's safety. The patient's presenting symptoms, physical exam findings, and initial radiographic and laboratory data in the context of their chronic comorbidities is felt to place them at high risk for further clinical deterioration. Furthermore, it is not anticipated that the patient will be medically stable for discharge from the hospital within 2 midnights of admission. The following  factors support the patient status of inpatient.   " The patient's presenting symptoms include accelerating unstable angina. " The worrisome physical exam findings include as above. " The initial radiographic and laboratory data are worrisome because of progression of multivessel CAD. " The chronic co-morbidities include CAD, HTN, HLD, DM2, and prior tobacco use.   * I certify that at the point of admission it is my clinical judgment that the patient will require inpatient hospital care spanning beyond 2 midnights from the point of admission due to high intensity of service, high risk for further deterioration and high frequency of surveillance required.*   For questions or updates, please contact [place] Please consult [email or web address] for contact info under     Signed, Christell Faith, PA-C  11/06/2020 3:14 PM   I have independently seen and examined the patient and agree with the findings and plan, as documented in the PA/NP's note, with the following additions/changes.  75 y/o man with known CAD s/p multiple PCI's and moderate-severe LMCA/LAD and RCA being managed medically, presenting with worsening CP consistent with UA.  Cath today showed worsening dz in proximal LAD.  He has been transferred from [facility] for CABG evaluation.  We will hold clopidogrel and restart IV heparin.  Echo pending.  Continue aggressive secondary prevention.  Nelva Bush, MD [facility]

## 2020-11-06 NOTE — H&P (Signed)
[place]   PATIENT NAME: Ricky Abbott    MR#:  [id number]  DATE OF BIRTH:  February 24, 1945  DATE OF ADMISSION:  11/05/2020  PRIMARY CARE PHYSICIAN: Theotis Burrow, MD   Patient is coming from: Home  REQUESTING/REFERRING PHYSICIAN: Hinda Kehr, MD  CHIEF COMPLAINT:  No chief complaint on file.   HISTORY OF PRESENT ILLNESS:  Ricky Abbott is a 75 y.o. Caucasian male with medical history significant for coronary artery disease, COPD, type 2 diabetes mellitus, GERD, dyslipidemia, hypertension and sarcoidosis, who presented to the emergency room with central acute chest pain felt as pressure and graded 8/10 in severity with associated diaphoresis and feeling clammy.  He denies any nausea or vomiting.  No abdominal pain or melena or bright red bleeding per rectum.  He has been constipated.  He denied any other bleeding diathesis.  He denied any cough or wheezing or dyspnea or palpitations.  He stated that he was watching TV when he had his pain.   ED Course: When he came to the ER vital signs were within normal alert blood pressure was 153/40.  Labs revealed a BUN of 33 with otherwise unremarkable CMP.  High-sensitivity troponin I was 5 and later 9.  CBC was within normal.  Influenza antigens and COVID-19 PCR came back negative. EKG as reviewed by me : EKG showed normal sinus rhythm with a rate of 82 with right bundle branch block and T wave inversion anteroseptally Imaging: 2 view abdomen x-ray showed no acute cardiopulmonary disease and negative abdominal radiographs.  The patient was given 4 mg IV morphine sulfate and 4 mg of IV Zofran and 1 inch of Nitropaste.  He was started on IV heparin with a bolus followed by drip. PAST MEDICAL HISTORY:   Past Medical History:  Diagnosis Date  . Allergy   . Anxiety   . Asthma   . CAD, multiple vessel   . COPD (chronic obstructive pulmonary disease) ([place])   . Depression   . Diabetes mellitus without complication ([place])    . Dyspnea   . GERD (gastroesophageal reflux disease)   . History of chicken pox   . Hyperlipidemia   . Hypertension   . MI (myocardial infarction) ([place])   . Prostate enlargement   . PTSD (post-traumatic stress disorder)    [place] [facility]  . Sarcoidosis of lung ([place])     PAST SURGICAL HISTORY:   Past Surgical History:  Procedure Laterality Date  . billary tube placement    . [place], [place]  . CARDIAC CATHETERIZATION     [facility]   . CHOLECYSTECTOMY N/A 06/05/2016   Procedure: LAPAROSCOPIC CHOLECYSTECTOMY with cholangiogram;  Surgeon: Jules Husbands, MD;  Location: [facility] ORS;  Service: General;  Laterality: N/A;  . [place], 05/2012   x 2  . IR GENERIC HISTORICAL  04/30/2016   IR PERC CHOLECYSTOSTOMY 04/30/2016 [facility]  . LEFT HEART CATH AND CORONARY ANGIOGRAPHY Left 10/06/2019   Procedure: LEFT HEART CATH AND CORONARY ANGIOGRAPHY;  Surgeon: Minna Merritts, MD;  Location: [facility];  Service: Cardiovascular;  Laterality: Left;    SOCIAL HISTORY:   Social History   Tobacco Use  . Smoking status: Former    Packs/day: 4.00    Years: 25.00    Pack years: 100.00    Types: Cigarettes    Quit date: 03/24/1981    Years since quitting: 39.6  . Smokeless  tobacco: Never  . Tobacco comments:    quit 30 years asgo  Substance Use Topics  . Alcohol use: No    FAMILY HISTORY:   Family History  Problem Relation Age of Onset  . Cancer Mother   . Hyperlipidemia Mother   . Hypertension Mother   . Cancer Father        stomach  . Hyperlipidemia Father   . Hypertension Father   . Cancer Brother        bone cancer    DRUG ALLERGIES:   Allergies  Allergen Reactions  . Peanut-Containing Drug Products Swelling    Swelling, rash, (Peanuts)    REVIEW OF SYSTEMS:   ROS As per history of present illness. All pertinent systems were reviewed above. Constitutional, HEENT, cardiovascular, respiratory, GI,  GU, musculoskeletal, neuro, psychiatric, endocrine, integumentary and hematologic systems were reviewed and are otherwise negative/unremarkable except for positive findings mentioned above in the HPI.   MEDICATIONS AT HOME:   Prior to Admission medications   Medication Sig Start Date End Date Taking? Authorizing Provider  aspirin 81 MG tablet Take 81 mg by mouth daily.  09/02/13  Yes Minna Merritts, MD  carvedilol (COREG) 12.5 MG tablet Take 1 tablet (12.5 mg total) by mouth 2 (two) times daily with a meal. 05/09/19  Yes Scarboro, Audie Clear, NP  clopidogrel (PLAVIX) 75 MG tablet Take 75 mg by mouth daily.   Yes [provider]  ezetimibe (ZETIA) 10 MG tablet Take 1 tablet (10 mg total) by mouth daily. 03/08/19  Yes Minna Merritts, MD  isosorbide mononitrate (IMDUR) 30 MG 24 hr tablet Take 1 tablet (30 mg total) by mouth daily. Take for BP 106 systolic or above, take half pill for BP 269 systolic or below 05/18/52  Yes Loel Dubonnet, NP  pantoprazole (PROTONIX) 40 MG tablet Take 1 tablet (40 mg total) by mouth daily. 05/09/19  Yes Scarboro, Audie Clear, NP  rosuvastatin (CRESTOR) 40 MG tablet Take 1 tablet (40 mg total) by mouth daily. 09/15/19  Yes Visser, Jacquelyn D, PA-C  sertraline (ZOLOFT) 100 MG tablet Take 100 mg by mouth daily. 10/10/20  Yes [provider]  albuterol (VENTOLIN HFA) 108 (90 Base) MCG/ACT inhaler  01/23/20   [provider]  nitroGLYCERIN (NITROSTAT) 0.4 MG SL tablet Place 1 tablet (0.4 mg total) under the tongue every 5 (five) minutes as needed for chest pain. 08/01/20   Minna Merritts, MD  sertraline (ZOLOFT) 25 MG tablet Take by mouth. Patient not taking: Reported on 11/06/2020 01/09/20   [provider]  sertraline (ZOLOFT) 50 MG tablet Take 1 tablet by mouth daily. Patient not taking: Reported on 11/06/2020 07/12/20   [provider]  tamsulosin (FLOMAX) 0.4 MG CAPS capsule Take 1 capsule (0.4 mg total) by mouth daily. Patient not  taking: Reported on 11/06/2020 05/09/19   Kendell Bane, NP      VITAL SIGNS:  Blood pressure (!) 128/56, pulse 68, temperature 98 F (36.7 C), temperature source Oral, resp. rate 16, height 5\' 10"  (1.778 m), weight 88 kg, SpO2 96 %.  PHYSICAL EXAMINATION:  Physical Exam  GENERAL:  75 y.o.-year-old Caucasian male patient lying in the bed with no acute distress.  EYES: Pupils equal, round, reactive to light and accommodation. No scleral icterus. Extraocular muscles intact.  HEENT: Head atraumatic, normocephalic. Oropharynx and nasopharynx clear.  NECK:  Supple, no jugular venous distention. No thyroid enlargement, no tenderness.  LUNGS: Normal breath sounds bilaterally, no wheezing,  rales,rhonchi or crepitation. No use of accessory muscles of respiration.  CARDIOVASCULAR: Regular rate and rhythm, S1, S2 normal. No murmurs, rubs, or gallops.  ABDOMEN: Soft, nondistended, nontender. Bowel sounds present. No organomegaly or mass.  EXTREMITIES: No pedal edema, cyanosis, or clubbing.  NEUROLOGIC: Cranial nerves II through XII are intact. Muscle strength 5/5 in all extremities. Sensation intact. Gait not checked.  PSYCHIATRIC: The patient is alert and oriented x 3.  Normal affect and good eye contact. SKIN: No obvious rash, lesion, or ulcer.   LABORATORY PANEL:   CBC Recent Labs  Lab 11/05/20 2152  WBC 7.7  HGB 14.1  HCT 40.8  PLT 173   ------------------------------------------------------------------------------------------------------------------  Chemistries  Recent Labs  Lab 11/05/20 2152  NA 139  K 3.6  CL 108  CO2 24  GLUCOSE 108*  BUN 33*  CREATININE 0.82  CALCIUM 9.3  AST 26  ALT 30  ALKPHOS 75  BILITOT 1.0   ------------------------------------------------------------------------------------------------------------------  Cardiac Enzymes No results for input(s): TROPONINI in the last 168  hours. ------------------------------------------------------------------------------------------------------------------  RADIOLOGY:  DG ABD ACUTE 2+V W 1V CHEST  Result Date: 11/05/2020 CLINICAL DATA:  Substernal chest pain EXAM: DG ABDOMEN ACUTE WITH 1 VIEW CHEST COMPARISON:  None. FINDINGS: There is no evidence of dilated bowel loops or free intraperitoneal air. Cholecystectomy clips are noted within the right upper quadrant. No radiopaque calculi or other significant radiographic abnormality is seen. Heart size and mediastinal contours are within normal limits. Both lungs are clear. IMPRESSION: Negative abdominal radiographs.  No acute cardiopulmonary disease. Electronically Signed   By: Fidela Salisbury M.D.   On: 11/05/2020 22:49      IMPRESSION AND PLAN:  Active Problems:   Chest pain  1.  Chest pain, rule out acute coronary syndrome with history of coronary artery disease. - The patient be admitted to an observation PCU bed. - We will follow serial troponin I's. - We will continue on aspirin as well as as needed sublingual nitroglycerin and morphine sulfate for pain. - We will continue beta-blocker therapy with Coreg and statin therapy as well as anterior. -We will continue IV heparin for now. - Cardiology consult to be obtained. - I notified Dr. Percival Spanish about the patient.  2.  Dyslipidemia. - We will continue statin therapy and Zetia.  3.  GERD. - We will continue PPI therapy.  4.  Constipation. - We will place him on as needed milk of magnesia and Fleet enema.  5.  Depression. - We will continue Zoloft.  6.  BPH. - We will continue Flomax.  7.  Type 2 diabetes mellitus. - We will place the patient on supplemental coverage with NovoLog.  DVT prophylaxis: IV heparin. Code Status: full code.  Family Communication:  The plan of care was discussed in details with the patient (and family). I answered all questions. The patient agreed to proceed with the above mentioned  plan. Further management will depend upon hospital course. Disposition Plan: Back to previous home environment Consults called: Cardiology. All the records are reviewed and case discussed with ED provider.  Status is: Observation  The patient remains OBS appropriate and will d/c before 2 midnights.  Dispo: The patient is from: Home              Anticipated d/c is to: Home              Patient currently is not medically stable to d/c.   Difficult to place patient No  TOTAL TIME TAKING CARE OF  THIS PATIENT: 55 minutes.    Christel Mormon M.D on 11/06/2020 at 5:51 AM  [facility]   From 7 PM-7 AM, contact night-coverage [email or web address]  CC: Primary care physician; Revelo, Elyse Jarvis, MD

## 2020-11-06 NOTE — Consult Note (Signed)
Cardiology Consultation:   Patient ID: THEADORE BLUNCK; [id number]; June 02, 1945   Admit date: 11/05/2020 Date of Consult: 11/06/2020  Primary Care Provider: Theotis Burrow, MD Primary Cardiologist: Ricky Abbott Primary Electrophysiologist:  None   Patient Profile:   Ricky Abbott is a 75 y.o. male with a hx of CAD s/p remote PCI with MI in 2014 s/p PCI x2 to the LAD in 2014, DM2, HTN, HLD, hepatitis, sarcoidosis of the lung, COPD, prior tobacco use quitting in 1984, PTSD with 3 tours in [place], EtOH, and depression who is being seen today for the evaluation of unstable angina at the request of Dr. Sidney Abbott.  History of Present Illness:   Ricky Abbott was seen in 08/2019 for preoperative cardiac evaluation for colonoscopy and hernia surgery and noted exertional dyspnea. In this setting, he underwent Lexiscan MPI on 09/22/2019 that showed mild to moderate ischemia in the inferior wall with prior notes indicating a predominantly fixed perfusion defect in the inferior wall on stress test imaging in 2015. EF 63%. He subsequently underwent LHC on 10/06/2019 showed ostial to mid left main 60% stenosis, ostial to proximal LAD 70% in-stent restenosis, proximal to mid LAD 60% heavily calcified stenosis, and 70% OM/ramus stenosis, nondominant RCA that was small with proximal 80% stenosis. Medical management was recommended.   ABIs normal bilaterally in 09/2019.   He reports an approximate year long history of worsening exertional angina, dyspnea, and fatigue that has progressively gotten worse with symptoms of angina now occurring at rest over the past couple of weeks. He reports having to stop to rest when ambulating to the laundry mat, which is about 50 yards away from his house. Symptoms of angina have been occurring at nighttime, when laying down and have previously improved with SL NTG, though they would frequently reoccur prompting him to take more SL NTG. Wellness nurse contacted our office,  and he was advised to go to the ED earlier this month. He declined. He has ran out of SL NTG and have been taking an old prescription. On the night of 9/26, he was watching TV when he developed substernal chest pain that radiated to the right shoulder and was associated with diaphoresis. He is uncertain if this pain felt similar to his prior MI. Symptoms did not improve after several SL NTG. Given persistent symptoms, he contacted EMS and was brought to the ED. He received ASA 324 mg in the field prior to arrival. EKG upon arrival showed sinus rhythm with a known RBBB. HS-Tn 5 with a delta troponin of 9. Plain films were not acute. In the ED, he received Maalox, morphine, nitropaste, and was placed on a heparin gtt. Currently, he is without angina.    Past Medical History:  Diagnosis Date   Allergy    Anxiety    Asthma    CAD, multiple vessel    COPD (chronic obstructive pulmonary disease) ([place])    Depression    Diabetes mellitus without complication (HCC)    Dyspnea    GERD (gastroesophageal reflux disease)    History of chicken pox    Hyperlipidemia    Hypertension    MI (myocardial infarction) ([place])    Prostate enlargement    PTSD (post-traumatic stress disorder)    [place] Vet   Sarcoidosis of lung ([place])     Past Surgical History:  Procedure Laterality Date   billary tube placement     [place]  CATHETERIZATION     [facility]    CHOLECYSTECTOMY N/A 06/05/2016   Procedure: LAPAROSCOPIC CHOLECYSTECTOMY with cholangiogram;  Surgeon: Ricky Husbands, MD;  Location: [facility] ORS;  Service: General;  Laterality: N/A;   [place], 05/2012   x 2   IR GENERIC HISTORICAL  04/30/2016   IR PERC CHOLECYSTOSTOMY 04/30/2016 [facility]INTER[facility] RAD   LEFT HEART CATH AND CORONARY ANGIOGRAPHY Left 10/06/2019   Procedure: LEFT HEART CATH AND CORONARY ANGIOGRAPHY;  Surgeon: Ricky Merritts, MD;  Location: [place] [facility];  Service:  Cardiovascular;  Laterality: Left;     Home Meds: Prior to Admission medications   Medication Sig Start Date End Date Taking? Authorizing Provider  aspirin 81 MG tablet Take 81 mg by mouth daily.  09/02/13  Yes Ricky Merritts, MD  carvedilol (COREG) 12.5 MG tablet Take 1 tablet (12.5 mg total) by mouth 2 (two) times daily with a meal. 05/09/19  Yes Abbott, Ricky Clear, NP  clopidogrel (PLAVIX) 75 MG tablet Take 75 mg by mouth daily.   Yes [provider]  ezetimibe (ZETIA) 10 MG tablet Take 1 tablet (10 mg total) by mouth daily. 03/08/19  Yes Ricky Merritts, MD  isosorbide mononitrate (IMDUR) 30 MG 24 hr tablet Take 1 tablet (30 mg total) by mouth daily. Take for BP 875 systolic or above, take half pill for BP 643 systolic or below 04/11/93  Yes Ricky Dubonnet, NP  pantoprazole (PROTONIX) 40 MG tablet Take 1 tablet (40 mg total) by mouth daily. 05/09/19  Yes Abbott, Ricky Clear, NP  rosuvastatin (CRESTOR) 40 MG tablet Take 1 tablet (40 mg total) by mouth daily. 09/15/19  Yes Abbott, Ricky D, PA-C  sertraline (ZOLOFT) 100 MG tablet Take 100 mg by mouth daily. 10/10/20  Yes [provider]  albuterol (VENTOLIN HFA) 108 (90 Base) MCG/ACT inhaler  01/23/20   [provider]  nitroGLYCERIN (NITROSTAT) 0.4 MG SL tablet Place 1 tablet (0.4 mg total) under the tongue every 5 (five) minutes as needed for chest pain. 08/01/20   Ricky Merritts, MD  sertraline (ZOLOFT) 25 MG tablet Take by mouth. Patient not taking: Reported on 11/06/2020 01/09/20   [provider]  sertraline (ZOLOFT) 50 MG tablet Take 1 tablet by mouth daily. Patient not taking: Reported on 11/06/2020 07/12/20   [provider]  tamsulosin (FLOMAX) 0.4 MG CAPS capsule Take 1 capsule (0.4 mg total) by mouth daily. Patient not taking: Reported on 11/06/2020 05/09/19   Ricky Bane, NP    Inpatient Medications: Scheduled Meds:  aspirin EC  81 mg Oral Daily   carvedilol  12.5 mg Oral BID WC    clopidogrel  75 mg Oral Daily   ezetimibe  10 mg Oral Daily   insulin aspart  0-9 Units Subcutaneous QID   isosorbide mononitrate  30 mg Oral Daily   pantoprazole  40 mg Oral Daily   rosuvastatin  40 mg Oral Daily   sertraline  100 mg Oral Daily   tamsulosin  0.4 mg Oral Daily   Continuous Infusions:  0.9 % NaCl with KCl 20 mEq / L 100 mL/hr at 11/06/20 0123   heparin 1,200 Units/hr (11/06/20 0040)   PRN Meds: acetaminophen, albuterol, ALPRAZolam, morphine injection, nitroGLYCERIN, ondansetron (ZOFRAN) IV, sodium phosphate, zolpidem  Allergies:   Allergies  Allergen Reactions   Peanut-Containing Drug Products Swelling    Swelling, rash, (Peanuts)    Social History:   Social History   Socioeconomic History  Marital status: Single    Spouse name: Not on file   Number of children: Not on file   Years of education: Not on file   Highest education level: Not on file  Occupational History   Not on file  Tobacco Use   Smoking status: Former    Packs/day: 4.00    Years: 25.00    Pack years: 100.00    Types: Cigarettes    Quit date: 03/24/1981    Years since quitting: 39.6   Smokeless tobacco: Never   Tobacco comments:    quit 30 years asgo  Vaping Use   Vaping Use: Never used  Substance and Sexual Activity   Alcohol use: No   Drug use: No   Sexual activity: Never  Other Topics Concern   Not on file  Social History Narrative   Yvone Neu grew up in [place], [place]. He moved to the [place] area in 2013. 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   Social Determinants of Health   Financial Resource Strain: Not on file  Food Insecurity: Not on file  Transportation Needs: Not on file  Physical Activity: Not on file  Stress: Not on file  Social Connections: Not on file  Intimate Partner Violence: Not on file     Family History:   Family History  Problem Relation Age of Onset   Cancer Mother    Hyperlipidemia Mother    Hypertension Mother    Cancer Father        stomach    Hyperlipidemia Father    Hypertension Father    Cancer Brother        bone cancer    ROS:  Review of Systems  Constitutional:  Positive for malaise/fatigue. Negative for chills, diaphoresis, fever and weight loss.  HENT:  Negative for congestion.   Eyes:  Negative for discharge and redness.  Respiratory:  Positive for shortness of breath. Negative for cough, sputum production and wheezing.   Cardiovascular:  Positive for chest pain and leg swelling. Negative for palpitations, orthopnea, claudication and PND.  Gastrointestinal:  Negative for abdominal pain, heartburn, nausea and vomiting.  Musculoskeletal:  Negative for falls and myalgias.  Skin:  Negative for rash.  Neurological:  Positive for weakness. Negative for dizziness, tingling, tremors, sensory change, speech change, focal weakness and loss of consciousness.  Endo/Heme/Allergies:  Does not bruise/bleed easily.  Psychiatric/Behavioral:  Negative for substance abuse. The patient is not nervous/anxious.   All other systems reviewed and are negative.    Physical Exam/Data:   Vitals:   11/06/20 0207 11/06/20 0400 11/06/20 0600 11/06/20 0900  BP: (!) 147/71 (!) 128/56 (!) 145/67 135/60  Pulse: 72 68 67 68  Resp: 16 16 16    Temp:      TempSrc:      SpO2: 95% 96% 96%   Weight:      Height:       No intake or output data in the 24 hours ending 11/06/20 1022 Filed Weights   11/05/20 2239  Weight: 88 kg   Body mass index is 27.84 kg/m.   Physical Exam: General: Well developed, well nourished, in no acute distress. Head: Normocephalic, atraumatic, sclera non-icteric, no xanthomas, nares without discharge.  Neck: Negative for carotid bruits. JVD not elevated. Lungs: Clear bilaterally to auscultation without wheezes, rales, or rhonchi. Breathing is unlabored. Heart: RRR with S1 S2. I/VI systolic murmur LSB, no rubs, or gallops appreciated. Abdomen: Soft, non-tender, non-distended with normoactive bowel sounds. No  hepatomegaly. No rebound/guarding. No obvious  abdominal masses. Msk:  Strength and tone appear normal for age. Extremities: No clubbing or cyanosis. No edema. Distal pedal pulses are 2+ and equal bilaterally. Neuro: Alert and oriented X 3. No facial asymmetry. No focal deficit. Moves all extremities spontaneously. Psych:  Responds to questions appropriately with a normal affect.   EKG:  The EKG was personally reviewed and demonstrates: NSR, 82 bpm, RBBB, and anterior TWI Telemetry:  Telemetry was personally reviewed and demonstrates: SR  Weights: [employer]   11/05/20 2239  Weight: 88 kg    Relevant CV Studies:  2D echo 10/07/2019: 1. Left ventricular ejection fraction, by estimation, is 60 to 65%. The  left ventricle has normal function. The left ventricle has no regional  wall motion abnormalities. Left ventricular diastolic parameters are  consistent with Grade I diastolic  dysfunction (impaired relaxation).   2. Right ventricular systolic function is normal. The right ventricular  size is normal. Tricuspid regurgitation signal is inadequate for assessing  PA pressure.   3. Mild mitral valve regurgitation. __________  LHC 10/06/2019: Coronary dominance: Left  Left mainstem:   Large vessel that bifurcates into the LAD and left circumflex, 60% mid to distal left main disease  Left anterior descending (LAD):   Large vessel that extends to the apical region, diagonal branch 2 of moderate size, stent, possibly 2 sequential stents in the proximal LAD with in-stent restenosis estimated at 70%, 60% disease of the LAD after D1, heavily calcified, diffuse disease  Left circumflex (LCx):  Large vessel with OM branch 2, high OM/ramus small vessel with severe disease estimated 80% throughout, mild diffuse disease of the left circumflex  Right coronary artery (RCA): Nondominant vessel, small caliber throughout, heavily diseased, severe proximal disease estimated 80%  Left  ventriculography: Left ventricular systolic function is normal, LVEF is estimated at 55%, there is no significant mitral regurgitation , no significant aortic valve stenosis  Final Conclusions:   Moderate disease of mid to distal left main, Moderate to severe disease of proximal to the mid LAD, in-stent restenosis Left dominant with small diffusely diseased RCA Grossly normal EF  Recommendations:  Case discussed with interventional cardiology, given very mild symptoms,  Will pursue medical management Start isosorbide for blood pressure control, For any worsening anginal symptoms may need to consider CABG with LIMA to the LAD The above was discussed with him in detail __________  Lexiscan MPI 09/22/2019: Pharmacological myocardial perfusion imaging study with mild to moderate ischemia in the inferior wall Unable to exclude diaphragmatic attenuation artifact Normal wall motion, EF estimated at 63% No EKG changes concerning for ischemia at peak stress or in recovery. CT attenuation correction images were unavailable Moderate risk scan Patient notes indicating previously noted predominantly fixed perfusion defect in the inferior wall on stress imaging 2015 Clinical correlation recommended.  Could consider alternate form of imaging such as cardiac CTA versus catheterization if symptoms indicate. __________  2D echo 04/28/2016: - Left ventricle: The cavity size was at the upper limits of    normal. Wall thickness was at the upper limits of normal.    Systolic function was normal. The estimated ejection fraction was    in the range of 55% to 60%. Wall motion was normal; there were no    regional wall motion abnormalities. Doppler parameters are    consistent with abnormal left ventricular relaxation (grade 1    diastolic dysfunction).  - Left atrium: The atrium was mildly dilated.  - Right ventricle: The cavity size was normal. Systolic function  was normal.    Laboratory  Data:  Chemistry Recent Labs  Lab 11/05/20 2152  NA 139  K 3.6  CL 108  CO2 24  GLUCOSE 108*  BUN 33*  CREATININE 0.82  CALCIUM 9.3  GFRNONAA >60  ANIONGAP 7    Recent Labs  Lab 11/05/20 2152  PROT 7.2  ALBUMIN 4.1  AST 26  ALT 30  ALKPHOS 75  BILITOT 1.0   Hematology Recent Labs  Lab 11/05/20 2152 11/06/20 0857  WBC 7.7 7.6  RBC 4.78 5.14  HGB 14.1 14.8  HCT 40.8 43.9  MCV 85.4 85.4  MCH 29.5 28.8  MCHC 34.6 33.7  RDW 14.4 14.6  PLT 173 174   Cardiac EnzymesNo results for input(s): TROPONINI in the last 168 hours. No results for input(s): TROPIPOC in the last 168 hours.  BNPNo results for input(s): BNP, PROBNP in the last 168 hours.  DDimer No results for input(s): DDIMER in the last 168 hours.  Radiology/Studies:  DG ABD ACUTE 2+V W 1V CHEST  Result Date: 11/05/2020 IMPRESSION: Negative abdominal radiographs.  No acute cardiopulmonary disease. Electronically Signed   By: Fidela Salisbury M.D.   On: 11/05/2020 22:49    Assessment and Plan:   1. CAD involving the native coronary arteries with unstable angina: -Currently chest pain free, high sensitivity troponin negative x 2 -Prior cath films reviewed with MD -Given escalation of symptoms, and known disease, recommend LHC today -Last dose of Plavix 75 mg was the morning of 11/06/2020 in the ED -ASA -Echo -Check lipid panel and A1c for further risk stratification  -NPO -Coreg, Imdur, Crestor, Zetia -Risks and benefits of cardiac catheterization have been discussed with the patient including risks of bleeding, bruising, infection, kidney damage, stroke, heart attack, and death. The patient understands these risks and is willing to proceed with the procedure. All questions have been answered and concerns listened to  2. HTN:  -Blood pressure well controlled -PTA Coreg  3. HLD:  -LDL 101 in 08/2019 with goal < 70 with a triglyceride of 194 -Repeat fasting lipid panel -PTA Crestor 40 mg and Zetia 10  mg -If LDL remains above goal, recommend [id number] in follow up  4. DM2: -A1c 6.5 in 05/2019 -SSI per IM  5. PTSD: -PTA Zoloft   6. GERD: -PTA Protonix    For questions or updates, please contact [place] Please consult [email or web address] for contact info under Cardiology/STEMI.   Signed, Christell Faith, PA-C [place] Pager: [phone] 11/06/2020, 10:22 AM

## 2020-11-06 NOTE — Interval H&P Note (Signed)
History and Physical Interval Note:  11/06/2020 1:08 PM  Ricky Abbott  has presented today for surgery, with the diagnosis of unstable angina.  The various methods of treatment have been discussed with the patient and family. After consideration of risks, benefits and other options for treatment, the patient has consented to  Procedure(s): LEFT HEART CATH AND CORONARY ANGIOGRAPHY (N/A) as a surgical intervention.  The patient's history has been reviewed, patient examined, no change in status, stable for surgery.  I have reviewed the patient's chart and labs.  Questions were answered to the patient's satisfaction.    Cath Lab Visit (complete for each Cath Lab visit)  Clinical Evaluation Leading to the Procedure:   ACS: Yes.    Non-ACS:  N/A  [name]

## 2020-11-06 NOTE — Progress Notes (Signed)
    Patient signed out to our card master at [facility].  [place] card master has called CVTS consult to [facility].  Patient will be admitted to general cardiology service at [facility] and consulted on by CVTS.

## 2020-11-06 NOTE — Progress Notes (Signed)
ANTICOAGULATION CONSULT NOTE - Follow Up Consult  Pharmacy Consult for Heparin Indication: chest pain/ACS  Allergies  Allergen Reactions   Peanut-Containing Drug Products Swelling    Swelling, rash, (Peanuts)    Patient Measurements: Height: 5\' 10"  (177.8 cm) Weight: 88 kg (194 lb 0.1 oz) IBW/kg (Calculated) : 73 Heparin Dosing Weight: 88 kg  Vital Signs: BP: 135/60 (09/27 0900) Pulse Rate: 68 (09/27 0900)  Labs: Recent Labs    11/05/20 2152 11/06/20 0003 11/06/20 0857  HGB 14.1  --  14.8  HCT 40.8  --  43.9  PLT 173  --  174  APTT 28  --   --   LABPROT 13.1  --   --   INR 1.0  --   --   HEPARINUNFRC  --   --  0.59  CREATININE 0.82  --   --   TROPONINIHS 5 9  --     Estimated Creatinine Clearance: 87 mL/min (by C-G formula based on SCr of 0.82 mg/dL).   Medications:  Scheduled:   aspirin EC  81 mg Oral Daily   carvedilol  12.5 mg Oral BID WC   clopidogrel  75 mg Oral Daily   ezetimibe  10 mg Oral Daily   insulin aspart  0-9 Units Subcutaneous QID   isosorbide mononitrate  30 mg Oral Daily   pantoprazole  40 mg Oral Daily   rosuvastatin  40 mg Oral Daily   sertraline  100 mg Oral Daily   tamsulosin  0.4 mg Oral Daily    Assessment: PMH relevant for CAD, COPD, DM, GERD, HTN, HLD, and sarcoidosis. Patient presents to ER with acute chest pain. Pharmacy consulted to manage heparin infusion for ACS/chest pain.  Goal of Therapy:  Heparin level 0.3-0.7 units/ml Monitor platelets by anticoagulation protocol: Yes   9/27 @ 0932  HL 0.59 - therapeutic x 1  Plan:  Continue heparin infusion at 1200 units/hr Check anti-Xa level in 6 hours and daily while on heparin Continue to monitor H&H and platelets  Marvell Stavola Rodriguez-Guzman PharmD, BCPS 11/06/2020 9:59 AM

## 2020-11-06 NOTE — Discharge Summary (Signed)
Physician Discharge Summary  Ricky Abbott NFA:[id number] DOB: 1945-12-10 DOA: 11/05/2020  PCP: Theotis Burrow, MD  Admit date: 11/05/2020 Discharge date: 11/06/2020  Admitted From: Home  Disposition: Transfer to [facility]  Recommendations for Outpatient Follow-up:  Per discharging team at [facility]: no  Equipment/Devices: none  Discharge Condition: stable  CODE STATUS: full  Diet recommendation: Heart Healthy     Discharge Diagnoses: Active Problems:   Chest pain    Summary of HPI and Hospital Course:  Per H&P by Dr. Sidney Ace: "Ricky Abbott is a 75 y.o. Caucasian male with medical history significant for coronary artery disease, COPD, type 2 diabetes mellitus, GERD, dyslipidemia, hypertension and sarcoidosis, who presented to the emergency room with central acute chest pain felt as pressure and graded 8/10 in severity with associated diaphoresis and feeling clammy.  He denies any nausea or vomiting.  No abdominal pain or melena or bright red bleeding per rectum.  He has been constipated.  He denied any other bleeding diathesis.  He denied any cough or wheezing or dyspnea or palpitations.  He stated that he was watching TV when he had his pain.    ED Course: When he came to the ER vital signs were within normal alert blood pressure was 153/40.  Labs revealed a BUN of 33 with otherwise unremarkable CMP.  High-sensitivity troponin I was 5 and later 9.  CBC was within normal.  Influenza antigens and COVID-19 PCR came back negative. EKG as reviewed by me : EKG showed normal sinus rhythm with a rate of 82 with right bundle branch block and T wave inversion anteroseptally Imaging: 2 view abdomen x-ray showed no acute cardiopulmonary disease and negative abdominal radiographs.  The patient was given 4 mg IV morphine sulfate and 4 mg of IV Zofran and 1 inch of Nitropaste.  He was started on IV heparin with a bolus followed by drip."     Patient was seen by  cardiology and taken to the Cath Lab today where progression of three-vessel coronary disease was noted.  Patient is hemodynamically stable and chest pain-free.  Transferring to [facility] for cardiothoracic surgery evaluation.   Discharge Instructions   Discharge Instructions     AMB Referral to Cardiac Rehabilitation - Phase II   Complete by: As directed    Unstable angina   Diagnosis: Other   After initial evaluation and assessments completed: Virtual Based Care may be provided alone or in conjunction with Phase 2 Cardiac Rehab based on patient barriers.: Yes   Diet - low sodium heart healthy   Complete by: As directed    Increase activity slowly   Complete by: As directed       Allergies as of 11/06/2020       Reactions   Peanut-containing Drug Products Swelling   Swelling, rash, (Peanuts)        Medication List     STOP taking these medications    clopidogrel 75 MG tablet Commonly known as: PLAVIX       TAKE these medications    acetaminophen 325 MG tablet Commonly known as: TYLENOL Take 2 tablets (650 mg total) by mouth every 4 (four) hours as needed for headache or mild pain.   albuterol 108 (90 Base) MCG/ACT inhaler Commonly known as: VENTOLIN HFA What changed: Another medication with the same name was added. Make sure you understand how and when to take each.   albuterol (2.5 MG/3ML) 0.083% nebulizer solution Commonly known as:  PROVENTIL Inhale 3 mLs (2.5 mg total) into the lungs every 4 (four) hours as needed for wheezing or shortness of breath. What changed: You were already taking a medication with the same name, and this prescription was added. Make sure you understand how and when to take each.   aspirin 81 MG tablet Take 81 mg by mouth daily. What changed: Another medication with the same name was added. Make sure you understand how and when to take each.   aspirin 81 MG EC tablet Take 1 tablet (81 mg total) by mouth daily. Swallow  whole. Start taking on: November 07, 2020 What changed: You were already taking a medication with the same name, and this prescription was added. Make sure you understand how and when to take each.   carvedilol 12.5 MG tablet Commonly known as: COREG Take 1 tablet (12.5 mg total) by mouth 2 (two) times daily with a meal.   ezetimibe 10 MG tablet Commonly known as: ZETIA Take 1 tablet (10 mg total) by mouth daily. Start taking on: November 07, 2020   hydrALAZINE 20 MG/ML injection Commonly known as: APRESOLINE Inject 0.5 mLs (10 mg total) into the vein every 20 (twenty) minutes as needed (high blood pressure).   isosorbide mononitrate 30 MG 24 hr tablet Commonly known as: IMDUR Take 1 tablet (30 mg total) by mouth daily. Start taking on: November 07, 2020 What changed: additional instructions   labetalol 5 MG/ML injection Commonly known as: NORMODYNE Inject 2 mLs (10 mg total) into the vein every 10 (ten) minutes as needed.   morphine 2 MG/ML injection Inject 1 mL (2 mg total) into the vein every 2 (two) hours as needed (chest pain).   nitroGLYCERIN 0.4 MG SL tablet Commonly known as: NITROSTAT Place 1 tablet (0.4 mg total) under the tongue every 5 (five) minutes as needed for chest pain.   pantoprazole 40 MG tablet Commonly known as: PROTONIX Take 1 tablet (40 mg total) by mouth daily. Start taking on: November 07, 2020   polyethylene glycol 17 g packet Commonly known as: MIRALAX / GLYCOLAX Take 17 g by mouth daily.   rosuvastatin 40 MG tablet Commonly known as: CRESTOR Take 1 tablet (40 mg total) by mouth daily. Start taking on: November 07, 2020   senna-docusate 8.6-50 MG tablet Commonly known as: Senokot-S Take 1 tablet by mouth 2 (two) times daily.   sertraline 100 MG tablet Commonly known as: ZOLOFT Take 1 tablet (100 mg total) by mouth daily. Start taking on: November 07, 2020 What changed: Another medication with the same name was removed. Continue  taking this medication, and follow the directions you see here.   sodium phosphate 7-19 GM/118ML Enem Place 133 mLs (1 enema total) rectally daily as needed for severe constipation.   tamsulosin 0.4 MG Caps capsule Commonly known as: FLOMAX Take 1 capsule (0.4 mg total) by mouth daily. Start taking on: November 07, 2020   zolpidem 5 MG tablet Commonly known as: AMBIEN Take 1 tablet (5 mg total) by mouth at bedtime as needed for sleep.        Allergies  Allergen Reactions   Peanut-Containing Drug Products Swelling    Swelling, rash, (Peanuts)     If you experience worsening of your admission symptoms, develop shortness of breath, life threatening emergency, suicidal or homicidal thoughts you must seek medical attention immediately by calling 911 or calling your MD immediately  if symptoms less severe.    Please note   You were cared  for by a hospitalist during your hospital stay. If you have any questions about your discharge medications or the care you received while you were in the hospital after you are discharged, you can call the unit and asked to speak with the hospitalist on call if the hospitalist that took care of you is not available. Once you are discharged, your primary care physician will handle any further medical issues. Please note that NO REFILLS for any discharge medications will be authorized once you are discharged, as it is imperative that you return to your primary care physician (or establish a relationship with a primary care physician if you do not have one) for your aftercare needs so that they can reassess your need for medications and monitor your lab values.   Consultations: Cardiology   Procedures/Studies: CARDIAC CATHETERIZATION  Result Date: 11/06/2020 Conclusions: Severe three-vessel coronary artery disease, as detailed below. Low normal left ventricular systolic function (LVEF 38-46%) with mid and apical inferior hypokinesis.  Mildly elevated  left ventricular filling pressure (LVEDP 20-25 mmHg). Recommendations: Transfer to [facility] for cardiac surgery consultation. Hold clopidogrel pending surgical consultation. Restart IV heparin 2 hours after TR band removal. Aggressive secondary prevention. Nelva Bush, MD [facility]  DG ABD ACUTE 2+V W 1V CHEST  Result Date: 11/05/2020 CLINICAL DATA:  Substernal chest pain EXAM: DG ABDOMEN ACUTE WITH 1 VIEW CHEST COMPARISON:  None. FINDINGS: There is no evidence of dilated bowel loops or free intraperitoneal air. Cholecystectomy clips are noted within the right upper quadrant. No radiopaque calculi or other significant radiographic abnormality is seen. Heart size and mediastinal contours are within normal limits. Both lungs are clear. IMPRESSION: Negative abdominal radiographs.  No acute cardiopulmonary disease. Electronically Signed   By: Fidela Salisbury M.D.   On: 11/05/2020 22:49     Left heart cath 11/07/2018   Subjective: Pt seen in ED holding for a bed, prior to going to cath lab.  He was chest pain free.  Reported constipation but no other acute complaints.   Discharge Exam: Vitals:   11/06/20 1500 11/06/20 1515  BP: (!) 99/56 (!) 100/58  Pulse: (!) 59 61  Resp: 11 12  Temp:    SpO2: 96% 94%   Vitals:   11/06/20 1433 11/06/20 1445 11/06/20 1500 11/06/20 1515  BP: (!) 103/56 (!) 99/56 (!) 99/56 (!) 100/58  Pulse: 65 60 (!) 59 61  Resp: 13 11 11 12   Temp:      TempSrc:      SpO2: 95% 95% 96% 94%  Weight:      Height:        General: Pt is alert, awake, not in acute distress Cardiovascular: RRR, S1/S2 +, no rubs, no gallops Respiratory: CTA bilaterally, no wheezing, no rhonchi Abdominal: Soft, NT, ND, bowel sounds + Extremities: no edema, no cyanosis    The results of significant diagnostics from this hospitalization (including imaging, microbiology, ancillary and laboratory) are listed below for reference.     Microbiology: Recent Results (from the past 240  hour(s))  Resp Panel by RT-PCR (Flu A&B, Covid) Nasopharyngeal Swab     Status: None   Collection Time: 11/05/20 10:20 PM   Specimen: Nasopharyngeal Swab; Nasopharyngeal(NP) swabs in vial transport medium  Result Value Ref Range Status   SARS Coronavirus 2 by RT PCR NEGATIVE NEGATIVE Final    Comment: (NOTE) SARS-CoV-2 target nucleic acids are NOT DETECTED.  The SARS-CoV-2 RNA is generally detectable in upper respiratory specimens during the acute phase of infection.  The lowest concentration of SARS-CoV-2 viral copies this assay can detect is 138 copies/mL. A negative result does not preclude SARS-Cov-2 infection and should not be used as the sole basis for treatment or other patient management decisions. A negative result may occur with  improper specimen collection/handling, submission of specimen other than nasopharyngeal swab, presence of viral mutation(s) within the areas targeted by this assay, and inadequate number of viral copies(<138 copies/mL). A negative result must be combined with clinical observations, patient history, and epidemiological information. The expected result is Negative.  Fact Sheet for Patients:  EntrepreneurPulse.com.au  Fact Sheet for Healthcare Providers:  IncredibleEmployment.be  This test is no t yet approved or cleared by the [place] FDA and  has been authorized for detection and/or diagnosis of SARS-CoV-2 by FDA under an Emergency Use Authorization (EUA). This EUA will remain  in effect (meaning this test can be used) for the duration of the COVID-19 declaration under Section 564(b)(1) of the Act, 21 U.S.C.section 360bbb-3(b)(1), unless the authorization is terminated  or revoked sooner.       Influenza A by PCR NEGATIVE NEGATIVE Final   Influenza B by PCR NEGATIVE NEGATIVE Final    Comment: (NOTE) The Xpert Xpress SARS-CoV-2/FLU/RSV plus assay is intended as an aid in the diagnosis of influenza from  Nasopharyngeal swab specimens and should not be used as a sole basis for treatment. Nasal washings and aspirates are unacceptable for Xpert Xpress SARS-CoV-2/FLU/RSV testing.  Fact Sheet for Patients: EntrepreneurPulse.com.au  Fact Sheet for Healthcare Providers: IncredibleEmployment.be  This test is not yet approved or cleared by the [place] FDA and has been authorized for detection and/or diagnosis of SARS-CoV-2 by FDA under an Emergency Use Authorization (EUA). This EUA will remain in effect (meaning this test can be used) for the duration of the COVID-19 declaration under Section 564(b)(1) of the Act, 21 U.S.C. section 360bbb-3(b)(1), unless the authorization is terminated or revoked.  Performed at [facility], [address] [place], [place] [address]      Labs: BNP (last 3 results) No results for input(s): BNP in the last 8760 hours. Basic Metabolic Panel: Recent Labs  Lab 11/05/20 2152  NA 139  K 3.6  CL 108  CO2 24  GLUCOSE 108*  BUN 33*  CREATININE 0.82  CALCIUM 9.3   Liver Function Tests: Recent Labs  Lab 11/05/20 2152  AST 26  ALT 30  ALKPHOS 75  BILITOT 1.0  PROT 7.2  ALBUMIN 4.1   No results for input(s): LIPASE, AMYLASE in the last 168 hours. No results for input(s): AMMONIA in the last 168 hours. CBC: Recent Labs  Lab 11/05/20 2152 11/06/20 0857  WBC 7.7 7.6  NEUTROABS 4.8  --   HGB 14.1 14.8  HCT 40.8 43.9  MCV 85.4 85.4  PLT 173 174   Cardiac Enzymes: No results for input(s): CKTOTAL, CKMB, CKMBINDEX, TROPONINI in the last 168 hours. BNP: Invalid input(s): POCBNP CBG: Recent Labs  Lab 11/06/20 0808 11/06/20 1130 11/06/20 1324  GLUCAP 119* 135* 104*   D-Dimer No results for input(s): DDIMER in the last 72 hours. Hgb A1c No results for input(s): HGBA1C in the last 72 hours. Lipid Profile Recent Labs    11/06/20 0003  CHOL 132  HDL 36*  LDLCALC 67  TRIG 143   CHOLHDL 3.7   Thyroid function studies No results for input(s): TSH, T4TOTAL, T3FREE, THYROIDAB in the last 72 hours.  Invalid input(s): FREET3 Anemia work up No results for input(s): VITAMINB12,  FOLATE, FERRITIN, TIBC, IRON, RETICCTPCT in the last 72 hours. Urinalysis    Component Value Date/Time   BILIRUBINUR neg 04/07/2016 1421   PROTEINUR neg 04/07/2016 1421   UROBILINOGEN 0.2 04/07/2016 1421   NITRITE neg 04/07/2016 1421   LEUKOCYTESUR Negative 04/07/2016 1421   Sepsis Labs Invalid input(s): PROCALCITONIN,  WBC,  LACTICIDVEN Microbiology Recent Results (from the past 240 hour(s))  Resp Panel by RT-PCR (Flu A&B, Covid) Nasopharyngeal Swab     Status: None   Collection Time: 11/05/20 10:20 PM   Specimen: Nasopharyngeal Swab; Nasopharyngeal(NP) swabs in vial transport medium  Result Value Ref Range Status   SARS Coronavirus 2 by RT PCR NEGATIVE NEGATIVE Final    Comment: (NOTE) SARS-CoV-2 target nucleic acids are NOT DETECTED.  The SARS-CoV-2 RNA is generally detectable in upper respiratory specimens during the acute phase of infection. The lowest concentration of SARS-CoV-2 viral copies this assay can detect is 138 copies/mL. A negative result does not preclude SARS-Cov-2 infection and should not be used as the sole basis for treatment or other patient management decisions. A negative result may occur with  improper specimen collection/handling, submission of specimen other than nasopharyngeal swab, presence of viral mutation(s) within the areas targeted by this assay, and inadequate number of viral copies(<138 copies/mL). A negative result must be combined with clinical observations, patient history, and epidemiological information. The expected result is Negative.  Fact Sheet for Patients:  EntrepreneurPulse.com.au  Fact Sheet for Healthcare Providers:  IncredibleEmployment.be  This test is no t yet approved or cleared by the  [place] FDA and  has been authorized for detection and/or diagnosis of SARS-CoV-2 by FDA under an Emergency Use Authorization (EUA). This EUA will remain  in effect (meaning this test can be used) for the duration of the COVID-19 declaration under Section 564(b)(1) of the Act, 21 U.S.C.section 360bbb-3(b)(1), unless the authorization is terminated  or revoked sooner.       Influenza A by PCR NEGATIVE NEGATIVE Final   Influenza B by PCR NEGATIVE NEGATIVE Final    Comment: (NOTE) The Xpert Xpress SARS-CoV-2/FLU/RSV plus assay is intended as an aid in the diagnosis of influenza from Nasopharyngeal swab specimens and should not be used as a sole basis for treatment. Nasal washings and aspirates are unacceptable for Xpert Xpress SARS-CoV-2/FLU/RSV testing.  Fact Sheet for Patients: EntrepreneurPulse.com.au  Fact Sheet for Healthcare Providers: IncredibleEmployment.be  This test is not yet approved or cleared by the [place] FDA and has been authorized for detection and/or diagnosis of SARS-CoV-2 by FDA under an Emergency Use Authorization (EUA). This EUA will remain in effect (meaning this test can be used) for the duration of the COVID-19 declaration under Section 564(b)(1) of the Act, 21 U.S.C. section 360bbb-3(b)(1), unless the authorization is terminated or revoked.  Performed at [facility], [address] [place], [place] [address]      Time coordinating discharge: Over 30 minutes  SIGNED:   Ezekiel Slocumb, DO [facility] 11/06/2020, 3:23 PM   If 7PM-7AM, please contact night-coverage [email or web address]

## 2020-11-06 NOTE — Progress Notes (Signed)
  PROGRESS NOTE    Ricky Abbott  KHT:[id number] DOB: Jun 17, 1945 DOA: 11/05/2020  PCP: Theotis Burrow, MD    LOS - 0    Patient admitted after midnight with chest pain.  Troponin's negative x 2.    Interval subjective: Patient seen in the ED holding for a bed this morning.  He reports resolution of chest pain.  No other acute complaints.  Reports cardiology team had just been to see him recently and plan to do cardiac cath.  Exam: Awake, alert and oriented x4, no acute distress Lungs clear bilaterally without wheezes or rhonchi, normal respiratory effort Heart regular rate and rhythm, no peripheral edema No gross focal neurologic deficits, normal speech Moves all extremities, no edema, normal tone Abdomen soft nontender   Active Problems:   Chest pain    I have reviewed the full H&P by Dr. Sidney Ace in detail, and I agree with the assessment and plan as outlined therein. In addition: --Follow-up cardiology recommendations post cath   No Charge    Ezekiel Slocumb, DO [facility]   If 7PM-7AM, please contact night-coverage [email or web address] 11/06/2020, 7:52 AM

## 2020-11-06 NOTE — Progress Notes (Signed)
PT transferred out with his belongings including his shoes and clothing with phone and wallet in his pants.

## 2020-11-06 NOTE — H&P (View-Only) (Signed)
Cardiology Consultation:   Patient ID: Ricky Abbott CLOS; [id number]; 01/07/1946   Admit date: 11/05/2020 Date of Consult: 11/06/2020  Primary Care Provider: Theotis Burrow, MD Primary Cardiologist: Rockey Situ Primary Electrophysiologist:  None   Patient Profile:   Ricky Abbott is a 75 y.o. male with a hx of CAD s/p remote PCI with MI in 2014 s/p PCI x2 to the LAD in 2014, DM2, HTN, HLD, hepatitis, sarcoidosis of the lung, COPD, prior tobacco use quitting in 1984, PTSD with 3 tours in [place], EtOH, and depression who is being seen today for the evaluation of unstable angina at the request of Dr. Sidney Ace.  History of Present Illness:   Ricky Abbott was seen in 08/2019 for preoperative cardiac evaluation for colonoscopy and hernia surgery and noted exertional dyspnea. In this setting, he underwent Lexiscan MPI on 09/22/2019 that showed mild to moderate ischemia in the inferior wall with prior notes indicating a predominantly fixed perfusion defect in the inferior wall on stress test imaging in 2015. EF 63%. He subsequently underwent LHC on 10/06/2019 showed ostial to mid left main 60% stenosis, ostial to proximal LAD 70% in-stent restenosis, proximal to mid LAD 60% heavily calcified stenosis, and 70% OM/ramus stenosis, nondominant RCA that was small with proximal 80% stenosis. Medical management was recommended.   ABIs normal bilaterally in 09/2019.   He reports an approximate year long history of worsening exertional angina, dyspnea, and fatigue that has progressively gotten worse with symptoms of angina now occurring at rest over the past couple of weeks. He reports having to stop to rest when ambulating to the laundry mat, which is about 50 yards away from his house. Symptoms of angina have been occurring at nighttime, when laying down and have previously improved with SL NTG, though they would frequently reoccur prompting him to take more SL NTG. Wellness nurse contacted our office,  and he was advised to go to the ED earlier this month. He declined. He has ran out of SL NTG and have been taking an old prescription. On the night of 9/26, he was watching TV when he developed substernal chest pain that radiated to the right shoulder and was associated with diaphoresis. He is uncertain if this pain felt similar to his prior MI. Symptoms did not improve after several SL NTG. Given persistent symptoms, he contacted EMS and was brought to the ED. He received ASA 324 mg in the field prior to arrival. EKG upon arrival showed sinus rhythm with a known RBBB. HS-Tn 5 with a delta troponin of 9. Plain films were not acute. In the ED, he received Maalox, morphine, nitropaste, and was placed on a heparin gtt. Currently, he is without angina.    Past Medical History:  Diagnosis Date   Allergy    Anxiety    Asthma    CAD, multiple vessel    COPD (chronic obstructive pulmonary disease) ([place])    Depression    Diabetes mellitus without complication (HCC)    Dyspnea    GERD (gastroesophageal reflux disease)    History of chicken pox    Hyperlipidemia    Hypertension    MI (myocardial infarction) ([place])    Prostate enlargement    PTSD (post-traumatic stress disorder)    [place] Vet   Sarcoidosis of lung ([place])     Past Surgical History:  Procedure Laterality Date   billary tube placement     [facility] [place]  CATHETERIZATION     [facility]    CHOLECYSTECTOMY N/A 06/05/2016   Procedure: LAPAROSCOPIC CHOLECYSTECTOMY with cholangiogram;  Surgeon: Jules Husbands, MD;  Location: [facility] ORS;  Service: General;  Laterality: N/A;   [place], 05/2012   x 2   IR GENERIC HISTORICAL  04/30/2016   IR PERC CHOLECYSTOSTOMY 04/30/2016 [facility]INTER[facility] RAD   LEFT HEART CATH AND CORONARY ANGIOGRAPHY Left 10/06/2019   Procedure: LEFT HEART CATH AND CORONARY ANGIOGRAPHY;  Surgeon: Minna Merritts, MD;  Location: [facility];  Service:  Cardiovascular;  Laterality: Left;     Home Meds: Prior to Admission medications   Medication Sig Start Date End Date Taking? Authorizing Provider  aspirin 81 MG tablet Take 81 mg by mouth daily.  09/02/13  Yes Minna Merritts, MD  carvedilol (COREG) 12.5 MG tablet Take 1 tablet (12.5 mg total) by mouth 2 (two) times daily with a meal. 05/09/19  Yes Scarboro, Audie Clear, NP  clopidogrel (PLAVIX) 75 MG tablet Take 75 mg by mouth daily.   Yes [provider]  ezetimibe (ZETIA) 10 MG tablet Take 1 tablet (10 mg total) by mouth daily. 03/08/19  Yes Minna Merritts, MD  isosorbide mononitrate (IMDUR) 30 MG 24 hr tablet Take 1 tablet (30 mg total) by mouth daily. Take for BP 277 systolic or above, take half pill for BP 824 systolic or below 03/16/51  Yes Loel Dubonnet, NP  pantoprazole (PROTONIX) 40 MG tablet Take 1 tablet (40 mg total) by mouth daily. 05/09/19  Yes Scarboro, Audie Clear, NP  rosuvastatin (CRESTOR) 40 MG tablet Take 1 tablet (40 mg total) by mouth daily. 09/15/19  Yes Visser, Jacquelyn D, PA-C  sertraline (ZOLOFT) 100 MG tablet Take 100 mg by mouth daily. 10/10/20  Yes [provider]  albuterol (VENTOLIN HFA) 108 (90 Base) MCG/ACT inhaler  01/23/20   [provider]  nitroGLYCERIN (NITROSTAT) 0.4 MG SL tablet Place 1 tablet (0.4 mg total) under the tongue every 5 (five) minutes as needed for chest pain. 08/01/20   Minna Merritts, MD  sertraline (ZOLOFT) 25 MG tablet Take by mouth. Patient not taking: Reported on 11/06/2020 01/09/20   [provider]  sertraline (ZOLOFT) 50 MG tablet Take 1 tablet by mouth daily. Patient not taking: Reported on 11/06/2020 07/12/20   [provider]  tamsulosin (FLOMAX) 0.4 MG CAPS capsule Take 1 capsule (0.4 mg total) by mouth daily. Patient not taking: Reported on 11/06/2020 05/09/19   Kendell Bane, NP    Inpatient Medications: Scheduled Meds:  aspirin EC  81 mg Oral Daily   carvedilol  12.5 mg Oral BID WC    clopidogrel  75 mg Oral Daily   ezetimibe  10 mg Oral Daily   insulin aspart  0-9 Units Subcutaneous QID   isosorbide mononitrate  30 mg Oral Daily   pantoprazole  40 mg Oral Daily   rosuvastatin  40 mg Oral Daily   sertraline  100 mg Oral Daily   tamsulosin  0.4 mg Oral Daily   Continuous Infusions:  0.9 % NaCl with KCl 20 mEq / L 100 mL/hr at 11/06/20 0123   heparin 1,200 Units/hr (11/06/20 0040)   PRN Meds: acetaminophen, albuterol, ALPRAZolam, morphine injection, nitroGLYCERIN, ondansetron (ZOFRAN) IV, sodium phosphate, zolpidem  Allergies:   Allergies  Allergen Reactions   Peanut-Containing Drug Products Swelling    Swelling, rash, (Peanuts)    Social History:   Social History   Socioeconomic History  Marital status: Single    Spouse name: Not on file   Number of children: Not on file   Years of education: Not on file   Highest education level: Not on file  Occupational History   Not on file  Tobacco Use   Smoking status: Former    Packs/day: 4.00    Years: 25.00    Pack years: 100.00    Types: Cigarettes    Quit date: 03/24/1981    Years since quitting: 39.6   Smokeless tobacco: Never   Tobacco comments:    quit 30 years asgo  Vaping Use   Vaping Use: Never used  Substance and Sexual Activity   Alcohol use: No   Drug use: No   Sexual activity: Never  Other Topics Concern   Not on file  Social History Narrative   Yvone Neu grew up in [place], [place]. He moved to the [place] area in 2013. 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   Social Determinants of Health   Financial Resource Strain: Not on file  Food Insecurity: Not on file  Transportation Needs: Not on file  Physical Activity: Not on file  Stress: Not on file  Social Connections: Not on file  Intimate Partner Violence: Not on file     Family History:   Family History  Problem Relation Age of Onset   Cancer Mother    Hyperlipidemia Mother    Hypertension Mother    Cancer Father        stomach    Hyperlipidemia Father    Hypertension Father    Cancer Brother        bone cancer    ROS:  Review of Systems  Constitutional:  Positive for malaise/fatigue. Negative for chills, diaphoresis, fever and weight loss.  HENT:  Negative for congestion.   Eyes:  Negative for discharge and redness.  Respiratory:  Positive for shortness of breath. Negative for cough, sputum production and wheezing.   Cardiovascular:  Positive for chest pain and leg swelling. Negative for palpitations, orthopnea, claudication and PND.  Gastrointestinal:  Negative for abdominal pain, heartburn, nausea and vomiting.  Musculoskeletal:  Negative for falls and myalgias.  Skin:  Negative for rash.  Neurological:  Positive for weakness. Negative for dizziness, tingling, tremors, sensory change, speech change, focal weakness and loss of consciousness.  Endo/Heme/Allergies:  Does not bruise/bleed easily.  Psychiatric/Behavioral:  Negative for substance abuse. The patient is not nervous/anxious.   All other systems reviewed and are negative.    Physical Exam/Data:   Vitals:   11/06/20 0207 11/06/20 0400 11/06/20 0600 11/06/20 0900  BP: (!) 147/71 (!) 128/56 (!) 145/67 135/60  Pulse: 72 68 67 68  Resp: 16 16 16    Temp:      TempSrc:      SpO2: 95% 96% 96%   Weight:      Height:       No intake or output data in the 24 hours ending 11/06/20 1022 Filed Weights   11/05/20 2239  Weight: 88 kg   Body mass index is 27.84 kg/m.   Physical Exam: General: Well developed, well nourished, in no acute distress. Head: Normocephalic, atraumatic, sclera non-icteric, no xanthomas, nares without discharge.  Neck: Negative for carotid bruits. JVD not elevated. Lungs: Clear bilaterally to auscultation without wheezes, rales, or rhonchi. Breathing is unlabored. Heart: RRR with S1 S2. I/VI systolic murmur LSB, no rubs, or gallops appreciated. Abdomen: Soft, non-tender, non-distended with normoactive bowel sounds. No  hepatomegaly. No rebound/guarding. No obvious  abdominal masses. Msk:  Strength and tone appear normal for age. Extremities: No clubbing or cyanosis. No edema. Distal pedal pulses are 2+ and equal bilaterally. Neuro: Alert and oriented X 3. No facial asymmetry. No focal deficit. Moves all extremities spontaneously. Psych:  Responds to questions appropriately with a normal affect.   EKG:  The EKG was personally reviewed and demonstrates: NSR, 82 bpm, RBBB, and anterior TWI Telemetry:  Telemetry was personally reviewed and demonstrates: SR  Weights: [employer]   11/05/20 2239  Weight: 88 kg    Relevant CV Studies:  2D echo 10/07/2019: 1. Left ventricular ejection fraction, by estimation, is 60 to 65%. The  left ventricle has normal function. The left ventricle has no regional  wall motion abnormalities. Left ventricular diastolic parameters are  consistent with Grade I diastolic  dysfunction (impaired relaxation).   2. Right ventricular systolic function is normal. The right ventricular  size is normal. Tricuspid regurgitation signal is inadequate for assessing  PA pressure.   3. Mild mitral valve regurgitation. __________  LHC 10/06/2019: Coronary dominance: Left  Left mainstem:   Large vessel that bifurcates into the LAD and left circumflex, 60% mid to distal left main disease  Left anterior descending (LAD):   Large vessel that extends to the apical region, diagonal branch 2 of moderate size, stent, possibly 2 sequential stents in the proximal LAD with in-stent restenosis estimated at 70%, 60% disease of the LAD after D1, heavily calcified, diffuse disease  Left circumflex (LCx):  Large vessel with OM branch 2, high OM/ramus small vessel with severe disease estimated 80% throughout, mild diffuse disease of the left circumflex  Right coronary artery (RCA): Nondominant vessel, small caliber throughout, heavily diseased, severe proximal disease estimated 80%  Left  ventriculography: Left ventricular systolic function is normal, LVEF is estimated at 55%, there is no significant mitral regurgitation , no significant aortic valve stenosis  Final Conclusions:   Moderate disease of mid to distal left main, Moderate to severe disease of proximal to the mid LAD, in-stent restenosis Left dominant with small diffusely diseased RCA Grossly normal EF  Recommendations:  Case discussed with interventional cardiology, given very mild symptoms,  Will pursue medical management Start isosorbide for blood pressure control, For any worsening anginal symptoms may need to consider CABG with LIMA to the LAD The above was discussed with him in detail __________  Lexiscan MPI 09/22/2019: Pharmacological myocardial perfusion imaging study with mild to moderate ischemia in the inferior wall Unable to exclude diaphragmatic attenuation artifact Normal wall motion, EF estimated at 63% No EKG changes concerning for ischemia at peak stress or in recovery. CT attenuation correction images were unavailable Moderate risk scan Patient notes indicating previously noted predominantly fixed perfusion defect in the inferior wall on stress imaging 2015 Clinical correlation recommended.  Could consider alternate form of imaging such as cardiac CTA versus catheterization if symptoms indicate. __________  2D echo 04/28/2016: - Left ventricle: The cavity size was at the upper limits of    normal. Wall thickness was at the upper limits of normal.    Systolic function was normal. The estimated ejection fraction was    in the range of 55% to 60%. Wall motion was normal; there were no    regional wall motion abnormalities. Doppler parameters are    consistent with abnormal left ventricular relaxation (grade 1    diastolic dysfunction).  - Left atrium: The atrium was mildly dilated.  - Right ventricle: The cavity size was normal. Systolic function  was normal.    Laboratory  Data:  Chemistry Recent Labs  Lab 11/05/20 2152  NA 139  K 3.6  CL 108  CO2 24  GLUCOSE 108*  BUN 33*  CREATININE 0.82  CALCIUM 9.3  GFRNONAA >60  ANIONGAP 7    Recent Labs  Lab 11/05/20 2152  PROT 7.2  ALBUMIN 4.1  AST 26  ALT 30  ALKPHOS 75  BILITOT 1.0   Hematology Recent Labs  Lab 11/05/20 2152 11/06/20 0857  WBC 7.7 7.6  RBC 4.78 5.14  HGB 14.1 14.8  HCT 40.8 43.9  MCV 85.4 85.4  MCH 29.5 28.8  MCHC 34.6 33.7  RDW 14.4 14.6  PLT 173 174   Cardiac EnzymesNo results for input(s): TROPONINI in the last 168 hours. No results for input(s): TROPIPOC in the last 168 hours.  BNPNo results for input(s): BNP, PROBNP in the last 168 hours.  DDimer No results for input(s): DDIMER in the last 168 hours.  Radiology/Studies:  DG ABD ACUTE 2+V W 1V CHEST  Result Date: 11/05/2020 IMPRESSION: Negative abdominal radiographs.  No acute cardiopulmonary disease. Electronically Signed   By: Fidela Salisbury M.D.   On: 11/05/2020 22:49    Assessment and Plan:   1. CAD involving the native coronary arteries with unstable angina: -Currently chest pain free, high sensitivity troponin negative x 2 -Prior cath films reviewed with MD -Given escalation of symptoms, and known disease, recommend LHC today -Last dose of Plavix 75 mg was the morning of 11/06/2020 in the ED -ASA -Echo -Check lipid panel and A1c for further risk stratification  -NPO -Coreg, Imdur, Crestor, Zetia -Risks and benefits of cardiac catheterization have been discussed with the patient including risks of bleeding, bruising, infection, kidney damage, stroke, heart attack, and death. The patient understands these risks and is willing to proceed with the procedure. All questions have been answered and concerns listened to  2. HTN:  -Blood pressure well controlled -PTA Coreg  3. HLD:  -LDL 101 in 08/2019 with goal < 70 with a triglyceride of 194 -Repeat fasting lipid panel -PTA Crestor 40 mg and Zetia 10  mg -If LDL remains above goal, recommend [id number] in follow up  4. DM2: -A1c 6.5 in 05/2019 -SSI per IM  5. PTSD: -PTA Zoloft   6. GERD: -PTA Protonix    For questions or updates, please contact [place] Please consult [email or web address] for contact info under Cardiology/STEMI.   Signed, Christell Faith, PA-C [place] Pager: [phone] 11/06/2020, 10:22 AM

## 2020-11-06 NOTE — Progress Notes (Deleted)
ANTICOAGULATION CONSULT NOTE   Pharmacy Consult for Heparin  Indication: chest pain/ACS  Allergies  Allergen Reactions   Peanut-Containing Drug Products Swelling    Swelling, rash, (Peanuts)    Patient Measurements: Height: 5\' 10"  (177.8 cm) Weight: 88 kg (194 lb 0.1 oz) IBW/kg (Calculated) : 73 Heparin Dosing Weight: 88 kg   Vital Signs: BP: 135/60 (09/27 0900) Pulse Rate: 68 (09/27 0900)  Labs: Recent Labs    11/05/20 2152 11/06/20 0003 11/06/20 0857  HGB 14.1  --  14.8  HCT 40.8  --  43.9  PLT 173  --  174  APTT 28  --   --   LABPROT 13.1  --   --   INR 1.0  --   --   HEPARINUNFRC  --   --  0.59  CREATININE 0.82  --   --   TROPONINIHS 5 9  --      Estimated Creatinine Clearance: 87 mL/min (by C-G formula based on SCr of 0.82 mg/dL).   Medical History: Past Medical History:  Diagnosis Date   Allergy    Anxiety    Asthma    CAD, multiple vessel    COPD (chronic obstructive pulmonary disease) ([place])    Depression    Diabetes mellitus without complication (HCC)    Dyspnea    GERD (gastroesophageal reflux disease)    History of chicken pox    Hyperlipidemia    Hypertension    MI (myocardial infarction) ([place])    Prostate enlargement    PTSD (post-traumatic stress disorder)    [place] Vet   Sarcoidosis of lung (HCC)     Medications:  (Not in a hospital admission)  Assessment: Pharmacy consulted to dose heparin in this 75 year old male admitted with ACS/NSTEMI.   CrCl = 87 ml/min No prior anticoag noted.   9/27 0857  HL=0.59  therapeutic, Will continue current rate of 1200 units/hr.  Goal of Therapy:  Heparin level 0.3-0.7 units/ml Monitor platelets by anticoagulation protocol: Yes   Plan:  9/27 0857  HL=0.59  therapeutic, Will continue current rate of 1200 units/hr. Check confirmatory HL in 8 hrs. F/u CBC every 3 days  Tarvis Blossom A 11/06/2020,9:53 AM

## 2020-11-06 NOTE — Progress Notes (Signed)
ANTICOAGULATION CONSULT NOTE - Follow Up Consult  Pharmacy Consult for Heparin Indication: chest pain/ACS  Allergies  Allergen Reactions   Peanut-Containing Drug Products Swelling    Swelling, rash, (Peanuts)    Patient Measurements: Height: 5\' 10"  (177.8 cm) Weight: 88 kg (194 lb 0.1 oz) IBW/kg (Calculated) : 73 Heparin Dosing Weight: 88 kg  Vital Signs: Temp: 98 F (36.7 C) (09/27 1314) BP: 108/61 (09/27 1630) Pulse Rate: 63 (09/27 1630)  Labs: Recent Labs    11/05/20 2152 11/06/20 0003 11/06/20 0857  HGB 14.1  --  14.8  HCT 40.8  --  43.9  PLT 173  --  174  APTT 28  --   --   LABPROT 13.1  --   --   INR 1.0  --   --   HEPARINUNFRC  --   --  0.59  CREATININE 0.82  --   --   TROPONINIHS 5 9  --      Estimated Creatinine Clearance: 87 mL/min (by C-G formula based on SCr of 0.82 mg/dL).   Medications:  Scheduled:   [MAR Hold] aspirin EC  81 mg Oral Daily   [MAR Hold] carvedilol  12.5 mg Oral BID WC   [MAR Hold] ezetimibe  10 mg Oral Daily   [MAR Hold] insulin aspart  0-9 Units Subcutaneous QID   [MAR Hold] isosorbide mononitrate  30 mg Oral Daily   [MAR Hold] pantoprazole  40 mg Oral Daily   [MAR Hold] polyethylene glycol  17 g Oral Daily   [MAR Hold] rosuvastatin  40 mg Oral Daily   [MAR Hold] senna-docusate  1 tablet Oral BID   [MAR Hold] sertraline  100 mg Oral Daily   sodium chloride flush  3 mL Intravenous Q12H   [MAR Hold] tamsulosin  0.4 mg Oral Daily    Assessment: PMH relevant for CAD, COPD, DM, GERD, HTN, HLD, and sarcoidosis. Patient presents to ER with acute chest pain. Pharmacy consulted to manage heparin infusion for ACS/chest pain.  Goal of Therapy:  Heparin level 0.3-0.7 units/ml Monitor platelets by anticoagulation protocol: Yes   9/27 @ 0932  HL 0.59 - therapeutic x 1  Plan:  Resume heparin infusion at 1200 units/hr Check anti-Xa level in 6 hours and daily while on heparin Continue to monitor H&H and platelets  Demaya Hardge  Rodriguez-Guzman PharmD, BCPS 11/06/2020 4:47 PM

## 2020-11-06 NOTE — Progress Notes (Signed)
ANTICOAGULATION CONSULT NOTE - Follow Up Consult  Pharmacy Consult for IV Heparin Indication: chest pain/ACS  Allergies  Allergen Reactions   Peanut-Containing Drug Products Swelling    Swelling, rash, (Peanuts)    Patient Measurements: Height: 5\' 11"  (180.3 cm) Weight: 81.6 kg (179 lb 12.8 oz) IBW/kg (Calculated) : 75.3 Heparin Dosing Weight: 82 kg  Vital Signs: Temp: 97.7 F (36.5 C) (09/27 1810) Temp Source: Oral (09/27 1810) BP: 105/60 (09/27 1900) Pulse Rate: 83 (09/27 1900)  Labs: Recent Labs    11/05/20 2152 11/06/20 0003 11/06/20 0857  HGB 14.1  --  14.8  HCT 40.8  --  43.9  PLT 173  --  174  APTT 28  --   --   LABPROT 13.1  --   --   INR 1.0  --   --   HEPARINUNFRC  --   --  0.59  CREATININE 0.82  --   --   TROPONINIHS 5 9  --     Estimated Creatinine Clearance: 82.9 mL/min (by C-G formula based on SCr of 0.82 mg/dL).   Assessment: 75 yr old man with PMH relevant for CAD, COPD, DM, GERD, HTN, HLD, and sarcoidosis presented to [facility] ER with acute chest pain. Pharmacy was consulted to manage heparin infusion for ACS/chest pain.  Pt is S/P cardiac cath, which showed severe 3-vessel disease. Heparin infusion was resumed at [facility] at 1200 units/hr post cath today ~1700 (pt had therapeutic heparin level of 0.59 units/ml on this infusion rate today pre cath). H/H WNL, stable. Per RN, pt had some bleeding at heparin IV site; site has been changed, with no further issues.  Goal of Therapy:  Heparin level 0.3-0.7 units/ml Monitor platelets by anticoagulation protocol: Yes  Plan:  Continue heparin infusion at 1200 units/hr Check heparin level in ~7 hrs Monitor daily heparin level, CBC Monitor for bleeding F/U CABG plans  Gillermina Hu, PharmD, BCPS, [facility] Clinical Pharmacist 11/06/2020 7:26 PM

## 2020-11-06 NOTE — Progress Notes (Signed)
  Mr Duffey arrived to [phone].  He denies CP or SOB  Some questions about upcoming plan, answered as well as I could.  Advised we would keep him comfortable overnight and he would be seen in am.  He is agreeable to this.  Rosaria Ferries, PA-C 11/06/2020 7:55 PM

## 2020-11-06 NOTE — Progress Notes (Signed)
ANTICOAGULATION CONSULT NOTE - Initial Consult  Pharmacy Consult for Heparin  Indication: chest pain/ACS  Allergies  Allergen Reactions   Peanut-Containing Drug Products Swelling    Swelling, rash, (Peanuts)    Patient Measurements: Height: 5\' 10"  (177.8 cm) Weight: 88 kg (194 lb 0.1 oz) IBW/kg (Calculated) : 73 Heparin Dosing Weight: 88 kg   Vital Signs: Temp: 98 F (36.7 C) (09/26 2140) Temp Source: Oral (09/26 2140) BP: 151/67 (09/27 0009) Pulse Rate: 75 (09/27 0009)  Labs: Recent Labs    11/05/20 2152  HGB 14.1  HCT 40.8  PLT 173  APTT 28  LABPROT 13.1  INR 1.0  CREATININE 0.82  TROPONINIHS 5    Estimated Creatinine Clearance: 87 mL/min (by C-G formula based on SCr of 0.82 mg/dL).   Medical History: Past Medical History:  Diagnosis Date   Allergy    Anxiety    Asthma    CAD, multiple vessel    COPD (chronic obstructive pulmonary disease) ([place])    Depression    Diabetes mellitus without complication (HCC)    Dyspnea    GERD (gastroesophageal reflux disease)    History of chicken pox    Hyperlipidemia    Hypertension    MI (myocardial infarction) ([place])    Prostate enlargement    PTSD (post-traumatic stress disorder)    [place] Vet   Sarcoidosis of lung (HCC)     Medications:  (Not in a hospital admission)   Assessment: Pharmacy consulted to dose heparin in this 75 year old male admitted with ACS/NSTEMI.   CrCl = 87 ml/min No prior anticoag noted.   Goal of Therapy:  Heparin level 0.3-0.7 units/ml Monitor platelets by anticoagulation protocol: Yes   Plan:  Give 4000 units bolus x 1 Start heparin infusion at 1200  units/hr Check anti-Xa level in 8 hours and daily while on heparin Continue to monitor H&H and platelets  Bradlee Bridgers D 11/06/2020,12:20 AM

## 2020-11-06 NOTE — Progress Notes (Signed)
Pt's right radial site was oozing blood, manual pressure held for approximately five minutes and pressure dressing replaced. Will continue to monitor.   Elaina Hoops, RN

## 2020-11-07 ENCOUNTER — Inpatient Hospital Stay (HOSPITAL_COMMUNITY): Payer: Medicare Other

## 2020-11-07 ENCOUNTER — Encounter: Payer: Self-pay | Admitting: Internal Medicine

## 2020-11-07 DIAGNOSIS — I25110 Atherosclerotic heart disease of native coronary artery with unstable angina pectoris: Secondary | ICD-10-CM

## 2020-11-07 DIAGNOSIS — I200 Unstable angina: Secondary | ICD-10-CM | POA: Diagnosis not present

## 2020-11-07 DIAGNOSIS — J449 Chronic obstructive pulmonary disease, unspecified: Secondary | ICD-10-CM

## 2020-11-07 LAB — PULMONARY FUNCTION TEST
FEF 25-75 Pre: 1.49 L/sec
FEF2575-%Pred-Pre: 64 %
FEV1-%Pred-Pre: 66 %
FEV1-Pre: 2.13 L
FEV1FVC-%Pred-Pre: 101 %
FEV6-%Pred-Pre: 69 %
FEV6-Pre: 2.87 L
FEV6FVC-%Pred-Pre: 105 %
FVC-%Pred-Pre: 65 %
FVC-Pre: 2.91 L
Pre FEV1/FVC ratio: 73 %
Pre FEV6/FVC Ratio: 99 %

## 2020-11-07 LAB — CBC
HCT: 39.4 % (ref 39.0–52.0)
Hemoglobin: 13 g/dL (ref 13.0–17.0)
MCH: 28 pg (ref 26.0–34.0)
MCHC: 33 g/dL (ref 30.0–36.0)
MCV: 84.9 fL (ref 80.0–100.0)
Platelets: 163 10*3/uL (ref 150–400)
RBC: 4.64 MIL/uL (ref 4.22–5.81)
RDW: 14.8 % (ref 11.5–15.5)
WBC: 7.6 10*3/uL (ref 4.0–10.5)
nRBC: 0 % (ref 0.0–0.2)

## 2020-11-07 LAB — GLUCOSE, CAPILLARY
Glucose-Capillary: 107 mg/dL — ABNORMAL HIGH (ref 70–99)
Glucose-Capillary: 108 mg/dL — ABNORMAL HIGH (ref 70–99)
Glucose-Capillary: 136 mg/dL — ABNORMAL HIGH (ref 70–99)
Glucose-Capillary: 127 mg/dL — ABNORMAL HIGH (ref 70–99)

## 2020-11-07 LAB — BASIC METABOLIC PANEL
Anion gap: 5 (ref 5–15)
BUN: 22 mg/dL (ref 8–23)
CO2: 23 mmol/L (ref 22–32)
Calcium: 8.9 mg/dL (ref 8.9–10.3)
Chloride: 108 mmol/L (ref 98–111)
Creatinine, Ser: 0.8 mg/dL (ref 0.61–1.24)
GFR, Estimated: >60 mL/min (ref >60)
Glucose, Bld: 106 mg/dL — ABNORMAL HIGH (ref 70–99)
Potassium: 4.1 mmol/L (ref 3.5–5.1)
Sodium: 136 mmol/L (ref 135–145)

## 2020-11-07 LAB — ECHOCARDIOGRAM COMPLETE
Area-P 1/2: 3.39 cm2
Height: 71 in
S' Lateral: 3.6 cm
Weight: 2876.8 oz

## 2020-11-07 LAB — HEMOGLOBIN A1C
Hgb A1c MFr Bld: 6.1 % — ABNORMAL HIGH (ref 4.8–5.6)
Mean Plasma Glucose: 128 mg/dL

## 2020-11-07 LAB — HEPARIN LEVEL (UNFRACTIONATED): Heparin Unfractionated: 0.47 IU/mL (ref 0.30–0.70)

## 2020-11-07 MED ORDER — MUPIROCIN 2 % EX OINT
1.0000 "application " | TOPICAL_OINTMENT | Freq: Two times a day (BID) | CUTANEOUS | Status: AC
  Administered 2020-11-07 – 2020-11-12 (×10): 1 via NASAL
  Filled 2020-11-07 (×2): qty 22

## 2020-11-07 MED ORDER — ORAL CARE MOUTH RINSE
15.0000 mL | Freq: Two times a day (BID) | OROMUCOSAL | Status: DC
  Administered 2020-11-07 – 2020-11-09 (×6): 15 mL via OROMUCOSAL

## 2020-11-07 MED ORDER — NITROGLYCERIN IN D5W 200-5 MCG/ML-% IV SOLN
0.0000 ug/min | INTRAVENOUS | Status: DC
  Administered 2020-11-07: 5 ug/min via INTRAVENOUS
  Administered 2020-11-09: 25 ug/min via INTRAVENOUS
  Administered 2020-11-11 – 2020-11-13 (×3): 35 ug/min via INTRAVENOUS
  Filled 2020-11-07 (×5): qty 250

## 2020-11-07 NOTE — Progress Notes (Signed)
Echocardiogram 2D Echocardiogram has been performed.  Oneal Deputy Shine Scrogham RDCS 11/07/2020, 9:28 AM

## 2020-11-07 NOTE — Consult Note (Addendum)
[address]       [place] [address]             [phone]        Cai W Gambrell  Medical Record #[id number] Date of Birth: [birth year]  Referring: Minus Breeding, MD Primary Care: Theotis Burrow, MD Primary Cardiologist:Timothy Rockey Situ, MD  Reason for Consult: Management multivessel coronary artery disease presenting with unstable angina pectoris.   History of Present Illness:      Mr. Ricky Abbott is a 75 year old gentleman with past history of coronary artery disease having PTCI of the right coronary and LAD in 2014.  He has been on Plavix since that time.  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  He describes having worsening exertional angina for about 1 year but developed an intense episode in January while at rest on 11/05/2020.  He took some old nitroglycerin tablets he had at home without effect.  EMS was summoned and he was given additional sublingual nitroglycerin without improvement in his pain.  He was taken to the [facility] regional ED.  Work-up in the emergency room included an EKG that showed sinus rhythm with right bundle branch block.  Serial high-sensitivity troponin levels were 5 and 9.  Chest x-ray showed no acute process.  He was started on a heparin infusion treated with nitroglycerin paste.  His pain resolved.  He was admitted to the hospital and cardiology was consulted.  Left heart catheterization was carried out yesterday demonstrating severe three-vessel coronary artery disease.  Left ventricular ejection fraction was 55 to 60%.  The left atrium was mildly dilated the right ventricle demonstrated normal size and systolic function.  Mr. Garner was transferred to [facility] for further evaluation and CT surgery consultation for coronary bypass  grafting. Since admission, Continues to have some stuttering chest pains.  He had an episode this morning while resting in his bed at around 3:30 AM. His pain resolved after 3 doses of sublingual nitroglycerin.  Subsequent EKG showed normal sinus rhythm with no acute ischemic changes.  Mr. Hedding has been divorced for many years and lives alone.  He is a retired Administrator.  He is left-handed.  He denies any known vascular issues including lower extremity varicosities.  On further questioning, however, he describes having intense pain in his upper thighs and hips when walking up steps which resolves with rest.  Other than the left heart catheterization, the only surgical procedure he has had is a laparoscopic cholecystectomy about a year ago.  He said this left him with a hernia above the umbilicus.  Current Activity/ Functional Status: Patient is independent with mobility/ambulation, transfers, ADL's, IADL's.   Zubrod Score: At the time of surgery this patient's for many years and lives alone.  most appropriate activity status/level should be described as: []     0    Normal activity, no symptoms [x]     1    Restricted in physical strenuous activity but ambulatory, able to do out light work []     2    Ambulatory and capable of self care, unable to do work activities, up and about                 more than 50%  Of the time                            []   3    Only limited self care, in bed greater than 50% of waking hours []     4    Completely disabled, no self care, confined to bed or chair []     5    Moribund  Past Medical History:  Diagnosis Date   Allergy    Anxiety    Asthma    CAD, multiple vessel    COPD (chronic obstructive pulmonary disease) ([place])    Depression    Diabetes mellitus without complication (HCC)    Dyspnea    GERD (gastroesophageal reflux disease)    History of chicken pox    Hyperlipidemia    Hypertension    MI (myocardial infarction) ([place])    Prostate  enlargement    PTSD (post-traumatic stress disorder)    [place] Vet   Sarcoidosis of lung ([place])     Past Surgical History:  Procedure Laterality Date   billary tube placement     [place], [place] N/A 06/05/2016   Procedure: LAPAROSCOPIC CHOLECYSTECTOMY with cholangiogram;  Surgeon: Jules Husbands, MD;  Location: [facility] ORS;  Service: General;  Laterality: N/A;   [place], 05/2012   x 2   IR GENERIC HISTORICAL  04/30/2016   IR PERC CHOLECYSTOSTOMY 04/30/2016 [facility]-INTERV RAD   LEFT HEART CATH AND CORONARY ANGIOGRAPHY Left 10/06/2019   Procedure: LEFT HEART CATH AND CORONARY ANGIOGRAPHY;  Surgeon: Minna Merritts, MD;  Location: [place] [facility];  Service: Cardiovascular;  Laterality: Left;   LEFT HEART CATH AND CORONARY ANGIOGRAPHY N/A 11/06/2020   Procedure: LEFT HEART CATH AND CORONARY ANGIOGRAPHY;  Surgeon: Nelva Bush, MD;  Location: [place] CV [facility];  Service: Cardiovascular;  Laterality: N/A;    Social History   Tobacco Use  Smoking Status Former   Packs/day: 4.00   Years: 25.00   Pack years: 100.00   Types: Cigarettes   Quit date: 03/24/1981   Years since quitting: 39.6  Smokeless Tobacco Never  Tobacco Comments   quit 30 years asgo    Social History   Substance and Sexual Activity  Alcohol Use No     Allergies  Allergen Reactions   Peanut-Containing Drug Products Swelling    Swelling, rash, (Peanuts)    Current Facility-Administered Medications  Medication Dose Route Frequency Provider Last Rate Last Admin   acetaminophen (TYLENOL) tablet 650 mg  650 mg Oral Q4H PRN Barrett, Evelene Croon, PA-C       aspirin EC tablet 81 mg  81 mg Oral Daily Barrett, Rhonda G, PA-C   81 mg at 11/07/20 0949   carvedilol (COREG) tablet 12.5 mg  12.5 mg Oral BID WC Barrett, Rhonda G, PA-C   12.5 mg at 11/07/20 0744   ezetimibe (ZETIA) tablet 10 mg  10 mg Oral  Daily Barrett, Rhonda G, PA-C   10 mg at 11/07/20 0949   heparin ADULT infusion 100 units/mL (25000 units/223m)  1,200 Units/hr Intravenous Continuous CBuford Dresser MD 12 mL/hr at 11/06/20 2155 1,200 Units/hr at 11/06/20 2155   insulin aspart (novoLOG) injection 0-15 Units  0-15 Units Subcutaneous TID WC Barrett, Rhonda G, PA-C       insulin aspart (novoLOG) injection 0-5 Units  0-5 Units Subcutaneous QHS Barrett, Rhonda G, PA-C       magnesium hydroxide (MILK OF MAGNESIA) suspension 15 mL  15 mL Oral Daily PRN CBuford Dresser MD  15 mL at 11/06/20 2214   MEDLINE mouth rinse  15 mL Mouth Rinse BID Buford Dresser, MD   15 mL at 11/07/20 0950   mupirocin ointment (BACTROBAN) 2 % 1 application  1 application Nasal BID Minus Breeding, MD       nitroGLYCERIN (NITROSTAT) SL tablet 0.4 mg  0.4 mg Sublingual Q5 Min x 3 PRN Barrett, Evelene Croon, PA-C   0.4 mg at 11/07/20 0701   nitroGLYCERIN 50 mg in dextrose 5 % 250 mL (0.2 mg/mL) infusion  0-200 mcg/min Intravenous Titrated Sande Rives E, PA-C 1.5 mL/hr at 11/07/20 0801 5 mcg/min at 11/07/20 0801   ondansetron (ZOFRAN) injection 4 mg  4 mg Intravenous Q6H PRN Barrett, Evelene Croon, PA-C       pantoprazole (PROTONIX) EC tablet 40 mg  40 mg Oral Daily Barrett, Evelene Croon, PA-C   40 mg at 11/07/20 0948   rosuvastatin (CRESTOR) tablet 40 mg  40 mg Oral Daily Barrett, Evelene Croon, PA-C   40 mg at 11/07/20 6295   senna-docusate (Senokot-S) tablet 1 tablet  1 tablet Oral BID Barrett, Evelene Croon, PA-C   1 tablet at 11/07/20 2841   sertraline (ZOLOFT) tablet 100 mg  100 mg Oral Daily Barrett, Evelene Croon, PA-C   100 mg at 11/07/20 3244   tamsulosin (FLOMAX) capsule 0.4 mg  0.4 mg Oral QPC supper Barrett, Rhonda G, PA-C       Facility-Administered Medications Ordered in Other Encounters  Medication Dose Route Frequency Provider Last Rate Last Admin   sodium chloride flush (NS) 0.9 % injection 3 mL  3 mL Intravenous Q12H Visser, Jacquelyn D, PA-C         Medications Prior to Admission  Medication Sig Dispense Refill Last Dose   acetaminophen (TYLENOL) 325 MG tablet Take 2 tablets (650 mg total) by mouth every 4 (four) hours as needed for headache or mild pain.      albuterol (PROVENTIL) (2.5 MG/3ML) 0.083% nebulizer solution Inhale 3 mLs (2.5 mg total) into the lungs every 4 (four) hours as needed for wheezing or shortness of breath. 75 mL 12    albuterol (VENTOLIN HFA) 108 (90 Base) MCG/ACT inhaler  (Patient not taking: No sig reported)      aspirin 81 MG tablet Take 81 mg by mouth daily.       aspirin EC 81 MG EC tablet Take 1 tablet (81 mg total) by mouth daily. Swallow whole. 30 tablet 11    carvedilol (COREG) 12.5 MG tablet Take 1 tablet (12.5 mg total) by mouth 2 (two) times daily with a meal.      ezetimibe (ZETIA) 10 MG tablet Take 1 tablet (10 mg total) by mouth daily.      hydrALAZINE (APRESOLINE) 20 MG/ML injection Inject 0.5 mLs (10 mg total) into the vein every 20 (twenty) minutes as needed (high blood pressure). 1 mL     isosorbide mononitrate (IMDUR) 30 MG 24 hr tablet Take 1 tablet (30 mg total) by mouth daily.      labetalol (NORMODYNE) 5 MG/ML injection Inject 2 mLs (10 mg total) into the vein every 10 (ten) minutes as needed. 20 mL     morphine 2 MG/ML injection Inject 1 mL (2 mg total) into the vein every 2 (two) hours as needed (chest pain). 1 mL 0    nitroGLYCERIN (NITROSTAT) 0.4 MG SL tablet Place 1 tablet (0.4 mg total) under the tongue every 5 (five) minutes as needed for chest pain.  12  pantoprazole (PROTONIX) 40 MG tablet Take 1 tablet (40 mg total) by mouth daily.      polyethylene glycol (MIRALAX / GLYCOLAX) 17 g packet Take 17 g by mouth daily. 14 each 0    rosuvastatin (CRESTOR) 40 MG tablet Take 1 tablet (40 mg total) by mouth daily.      senna-docusate (SENOKOT-S) 8.6-50 MG tablet Take 1 tablet by mouth 2 (two) times daily.      sertraline (ZOLOFT) 100 MG tablet Take 1 tablet (100 mg total) by mouth daily.       sodium phosphate (FLEET) 7-19 GM/118ML ENEM Place 133 mLs (1 enema total) rectally daily as needed for severe constipation.  0    tamsulosin (FLOMAX) 0.4 MG CAPS capsule Take 1 capsule (0.4 mg total) by mouth daily. 30 capsule     zolpidem (AMBIEN) 5 MG tablet Take 1 tablet (5 mg total) by mouth at bedtime as needed for sleep. 30 tablet 0     Family History  Problem Relation Age of Onset   Cancer Mother    Hyperlipidemia Mother    Hypertension Mother    Cancer Father        stomach   Hyperlipidemia Father    Hypertension Father    Cancer Brother        bone cancer     Review of Systems:       Cardiac Review of Systems: Y or  [    ]= no  Chest Pain [  x  ]  Resting SOB [   ] Exertional SOB  [  x]  Orthopnea [  ]   Pedal Edema [   ]    Palpitations [  ] Syncope  [  ]   Presyncope [   ]  General Review of Systems: [Y] = yes [  ]=no Constitional: recent weight change [  ]; anorexia [  ]; fatigue [  ]; nausea [  ]; night sweats [  ]; fever [  ]; or chills [  ]                                                               Dental: Last Dentist visit:   Eye : blurred vision [  ]; diplopia [   ]; vision changes [  ];  Amaurosis fugax[  ]; Resp: cough [  ];  wheezing[  ];  hemoptysis[  ]; shortness of breath[  ]; paroxysmal nocturnal dyspnea[  ]; dyspnea on exertion[  ]; or orthopnea[  ];  GI:  gallstones[  ], vomiting[  ];  dysphagia[  ]; melena[  ];  hematochezia [  ]; heartburn[  ];   Hx of  Colonoscopy[  ]; GU: kidney stones [  ]; hematuria[  ];   dysuria [  ];  nocturia[  ];  history of     obstruction [  ]; urinary frequency [  ]             Skin: rash, swelling[  ];, hair loss[  ];  peripheral edema[  ];  or itching[  ]; Musculosketetal: myalgias[  ];  joint swelling[  ];  joint erythema[  ];  joint pain[  ];  back pain[  ];  Heme/Lymph: bruising[  ];  bleeding[  ];  anemia[  ];  Neuro: TIA[  ];  headaches[  ];  stroke[  ];  vertigo[  ];  seizures[  ];   paresthesias[  ];   difficulty walking[  ];  Psych:depression[  ]; anxiety[  ];  Endocrine: diabetes[  ];  thyroid dysfunction[  ];                  Physical Exam: BP 126/77   Pulse 70   Temp 98 F (36.7 C) (Oral)   Resp 18   Ht 5' 11"  (1.803 m)   Wt 81.6 kg   SpO2 98%   BMI 25.08 kg/m    General appearance: alert, cooperative, and mild distress Head: Normocephalic, without obvious abnormality, atraumatic Neck: no adenopathy, no carotid bruit, no JVD, and supple, symmetrical, trachea midline Lymph nodes: No cervical or clavicular adenopathy Resp: Breath sounds clear, full and equal. Cardio: Regular rate and rhythm, no murmur. GI: Soft and nontender.  Active bowel sounds.  He has a small palpable fascial defect beneath the well-healed 3 cm surgical scar just above his umbilicus.  Extremities: No gross deformity.  No lower extremity varicosities.  All are warm and well-perfused. Neurologic: Grossly normal  Diagnostic Studies & Laboratory data:   [facility] 09-Nov-2020: Conclusions: Severe three-vessel coronary artery disease, as detailed below. Low normal left ventricular systolic function (LVEF 12-19%) with mid and apical inferior hypokinesis.  Mildly elevated left ventricular filling pressure (LVEDP 20-25 mmHg).   Recommendations: Transfer to [facility] for cardiac surgery consultation. Hold clopidogrel pending surgical consultation. Restart IV heparin 2 hours after TR band removal. Aggressive secondary prevention.     Diagnostic Dominance: Co-dominant Left Main  Vessel is large.  Mid LM to Dist LM lesion is 55% stenosed. The lesion is eccentric.  Left Anterior Descending  Vessel is large.  Ost LAD lesion is 75% stenosed.  Prox LAD lesion is 90% stenosed. The lesion was previously treated using a stent (unknown type) over 2 years ago. Previously placed stent displays restenosis.  Mid LAD lesion is 70% stenosed. 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  Second Diagonal Branch   Vessel is small in size.  Ramus Intermedius  Vessel is moderate in size.  Ramus lesion is 50% stenosed.  Left Circumflex  Vessel is large.  Prox Cx lesion is 55% stenosed. The lesion is eccentric.  First Obtuse Marginal Branch  Vessel is small in size.  1st Mrg lesion is 90% stenosed.  Second Obtuse Marginal Branch  Vessel is small in size. There is mild disease in the vessel.  First Left Posterolateral Branch  Vessel is small in size.  Second Left Posterolateral Branch  Vessel is small in size.  2nd LPL lesion is 30% stenosed.  Third Left Posterolateral Branch  Vessel is small in size.  Right Coronary Artery  Vessel is moderate in size.  Ost RCA to Prox RCA lesion is 70% stenosed. The lesion was previously treated using a stent (unknown type) over 2 years ago.  Mid RCA lesion is 50% stenosed.  Right Posterior Descending Artery  Vessel is small in size.  Intervention   No interventions have been documented. Wall Motion                  Left Heart   Left Ventricle The left ventricular size is normal. The left ventricular systolic function is normal. LV end diastolic pressure is mildly elevated. LVEDP 20-25 mmHg. The left ventricular ejection fraction is 50-55% by visual estimate.  There are LV function abnormalities due to segmental dysfunction.  Aortic Valve There is no aortic valve stenosis.    Coronary Diagrams   Diagnostic Dominance: Co-dominant  __________   2D echo 10/07/2019: 1. Left ventricular ejection fraction, by estimation, is 60 to 65%. The  left ventricle has normal function. The left ventricle has no regional  wall motion abnormalities. Left ventricular diastolic parameters are  consistent with Grade I diastolic  dysfunction (impaired relaxation).   2. Right ventricular systolic function is normal. The right ventricular  size is normal. Tricuspid regurgitation signal is inadequate for assessing  PA pressure.   3. Mild mitral valve  regurgitation.      I have independently reviewed the above radiologic studies and discussed with the patient   Recent Lab Findings: Lab Results  Component Value Date   WBC 7.6 11/07/2020   HGB 13.0 11/07/2020   HCT 39.4 11/07/2020   PLT 163 11/07/2020   GLUCOSE 106 (H) 11/07/2020   CHOL 132 11/06/2020   TRIG 143 11/06/2020   HDL 36 (L) 11/06/2020   LDLDIRECT 101 (H) 09/09/2019   LDLCALC 67 11/06/2020   ALT 30 11/05/2020   AST 26 11/05/2020   NA 136 11/07/2020   K 4.1 11/07/2020   CL 108 11/07/2020   CREATININE 0.80 11/07/2020   BUN 22 11/07/2020   CO2 23 11/07/2020   TSH 0.95 02/19/2015   INR 1.0 11/05/2020   HGBA1C 6.2 (H) 11/06/2020      Assessment / Plan:      -Very pleasant 75 year old gentleman with known coronary irregularities post PTC I of the LAD and RCA in 2014 following myocardial infarction.  He now presents with unstable angina a few days duration that was preceded by exertional angina for the past year.  He is currently on heparin and nitroglycerin infusions.  He had 1 brief episode of chest pressure during this interview while he was talking that resolved within about 1 minute.  Left heart catheterization and echo cardiogram results were reviewed.  Coronary bypass grafting is his best option for revascularization.  Procedure the and expected perioperative course was discussed with Mr. Hoshino.  Would like for [place] to proceed with preoperative work-up in preparation for surgery.  He was taking Plavix on a regular basis prior to admission admission his last dose yesterday morning (11/06/2020).  We will need to wait 4 more days before proceeding with surgery as long as he remains stable.  He is tentatively scheduled for Tuesday, 11/13/2020.  -Exertional hip and thigh pain-this may represent claudication.  A lower extremity Doppler study done in August 2021  showed bilateral resting ankle-brachial indices to be within normal range.  We will obtain lower extremity  arterial duplex screening and get vascular surgery involved if appropriate.  -History of COPD and remote history of sarcoidosis.  Check PFTs.  -Hypertension-initially managed with carvedilol 12.5 mg daily twice daily prior to admission.  -Dyslipidemia-he has been on rosuvastatin 40 mg daily  -History of depression and PTSD on Zoloft 50 mg daily   I  spent 30 minutes counseling the patient face to face.   Antony Odea, PA-C  11/07/2020 11:44 AM      Agree with above Severe 3V CAD, no significant valvular disease, and mild LV hypokinesis.  Awaiting plavix washout.  OR on Tues of next week.  Stellar Gensel Bary Leriche

## 2020-11-07 NOTE — Plan of Care (Signed)
  Problem: Health Behavior/Discharge Planning: Goal: Ability to manage health-related needs will improve Outcome: Progressing   Problem: Clinical Measurements: Goal: Ability to maintain clinical measurements within normal limits will improve Outcome: Progressing Goal: Will remain free from infection Outcome: Progressing Goal: Diagnostic test results will improve Outcome: Progressing Goal: Respiratory complications will improve Outcome: Progressing Goal: Cardiovascular complication will be avoided Outcome: Progressing   Problem: Activity: Goal: Risk for activity intolerance will decrease Outcome: Progressing   Problem: Nutrition: Goal: Adequate nutrition will be maintained Outcome: Progressing   Problem: Education: Goal: Understanding of CV disease, CV risk reduction, and recovery process will improve Outcome: Progressing Goal: Individualized Educational Video(s) Outcome: Progressing   Problem: Activity: Goal: Ability to return to baseline activity level will improve Outcome: Progressing   Problem: Cardiovascular: Goal: Ability to achieve and maintain adequate cardiovascular perfusion will improve Outcome: Progressing Goal: Vascular access site(s) Level 0-1 will be maintained Outcome: Progressing   Problem: Health Behavior/Discharge Planning: Goal: Ability to safely manage health-related needs after discharge will improve Outcome: Progressing

## 2020-11-07 NOTE — Plan of Care (Signed)
  Problem: Health Behavior/Discharge Planning: Goal: Ability to manage health-related needs will improve 11/07/2020 2310 by Tobe Sos, RN Outcome: Progressing 11/07/2020 2309 by Tobe Sos, RN Outcome: Progressing   Problem: Clinical Measurements: Goal: Ability to maintain clinical measurements within normal limits will improve 11/07/2020 2310 by Tobe Sos, RN Outcome: Progressing 11/07/2020 2309 by Tobe Sos, RN Outcome: Progressing Goal: Will remain free from infection 11/07/2020 2310 by Tobe Sos, RN Outcome: Progressing 11/07/2020 2309 by Tobe Sos, RN Outcome: Progressing Goal: Diagnostic test results will improve 11/07/2020 2310 by Tobe Sos, RN Outcome: Progressing 11/07/2020 2309 by Tobe Sos, RN Outcome: Progressing Goal: Respiratory complications will improve 11/07/2020 2310 by Tobe Sos, RN Outcome: Progressing 11/07/2020 2309 by Tobe Sos, RN Outcome: Progressing Goal: Cardiovascular complication will be avoided 11/07/2020 2310 by Tobe Sos, RN Outcome: Progressing 11/07/2020 2309 by Tobe Sos, RN Outcome: Progressing   Problem: Activity: Goal: Risk for activity intolerance will decrease 11/07/2020 2310 by Tobe Sos, RN Outcome: Progressing 11/07/2020 2309 by Tobe Sos, RN Outcome: Progressing   Problem: Nutrition: Goal: Adequate nutrition will be maintained 11/07/2020 2310 by Tobe Sos, RN Outcome: Progressing 11/07/2020 2309 by Tobe Sos, RN Outcome: Progressing   Problem: Education: Goal: Understanding of CV disease, CV risk reduction, and recovery process will improve 11/07/2020 2310 by Tobe Sos, RN Outcome: Progressing 11/07/2020 2309 by Tobe Sos, RN Outcome: Progressing Goal: Individualized Educational Video(s) 11/07/2020 2310 by Tobe Sos, RN Outcome: Progressing 11/07/2020 2309 by Tobe Sos, RN Outcome: Progressing   Problem: Activity: Goal: Ability to return to baseline activity level will improve 11/07/2020 2310 by Tobe Sos, RN Outcome: Progressing 11/07/2020 2309 by Tobe Sos, RN Outcome: Progressing   Problem: Cardiovascular: Goal: Ability to achieve and maintain adequate cardiovascular perfusion will improve 11/07/2020 2310 by Tobe Sos, RN Outcome: Progressing 11/07/2020 2309 by Tobe Sos, RN Outcome: Progressing Goal: Vascular access site(s) Level 0-1 will be maintained 11/07/2020 2310 by Tobe Sos, RN Outcome: Progressing 11/07/2020 2309 by Tobe Sos, RN Outcome: Progressing   Problem: Health Behavior/Discharge Planning: Goal: Ability to safely manage health-related needs after discharge will improve 11/07/2020 2310 by Tobe Sos, RN Outcome: Progressing 11/07/2020 2309 by Tobe Sos, RN Outcome: Progressing

## 2020-11-07 NOTE — Plan of Care (Signed)
  Problem: Health Behavior/Discharge Planning: Goal: Ability to manage health-related needs will improve 11/07/2020 0126 by Tobe Sos, RN Outcome: Progressing 11/07/2020 0121 by Tobe Sos, RN Outcome: Progressing 11/07/2020 0118 by Tobe Sos, RN Outcome: Progressing   Problem: Clinical Measurements: Goal: Ability to maintain clinical measurements within normal limits will improve 11/07/2020 0126 by Tobe Sos, RN Outcome: Progressing 11/07/2020 0121 by Tobe Sos, RN Outcome: Progressing 11/07/2020 0118 by Tobe Sos, RN Outcome: Progressing Goal: Will remain free from infection 11/07/2020 0126 by Tobe Sos, RN Outcome: Progressing 11/07/2020 0121 by Tobe Sos, RN Outcome: Progressing 11/07/2020 0118 by Tobe Sos, RN Outcome: Progressing Goal: Diagnostic test results will improve 11/07/2020 0126 by Tobe Sos, RN Outcome: Progressing 11/07/2020 0121 by Tobe Sos, RN Outcome: Progressing 11/07/2020 0118 by Tobe Sos, RN Outcome: Progressing Goal: Respiratory complications will improve 11/07/2020 0126 by Tobe Sos, RN Outcome: Progressing 11/07/2020 0121 by Tobe Sos, RN Outcome: Progressing 11/07/2020 0118 by Tobe Sos, RN Outcome: Progressing Goal: Cardiovascular complication will be avoided 11/07/2020 0126 by Tobe Sos, RN Outcome: Progressing 11/07/2020 0121 by Tobe Sos, RN Outcome: Progressing 11/07/2020 0118 by Tobe Sos, RN Outcome: Progressing   Problem: Activity: Goal: Risk for activity intolerance will decrease 11/07/2020 0126 by Tobe Sos, RN Outcome: Progressing 11/07/2020 0121 by Tobe Sos, RN Outcome: Progressing 11/07/2020 0118 by Tobe Sos, RN Outcome: Progressing   Problem: Nutrition: Goal: Adequate nutrition will be maintained 11/07/2020 0126 by Tobe Sos, RN Outcome:  Progressing 11/07/2020 0121 by Tobe Sos, RN Outcome: Progressing 11/07/2020 0118 by Tobe Sos, RN Outcome: Progressing

## 2020-11-07 NOTE — Plan of Care (Signed)

## 2020-11-07 NOTE — Progress Notes (Addendum)
Progress Note  Patient Name: Ricky Abbott Date of Encounter: 11/07/2020  [facility] Cardiologist: Ida Rogue, MD   Subjective   Called to bedside this morning because patient was having recurrent chest pain. He required 3 doses of sublingual Nitro. By the time I got to bedside, patient was chest pain free. EKG shows normal sinus rhythm with no acute ischemic changes. Patient described the pain as a pressure with intermittent sharp pain. He also notes some mild sharp pain when he takes a deep breath. The chest pressure reminded him of the pain he had prior to his MI. No shortness of breath, nausea, or vomiting. Resting comfortably now.  Inpatient Medications    Scheduled Meds:  aspirin EC  81 mg Oral Daily   carvedilol  12.5 mg Oral BID WC   ezetimibe  10 mg Oral Daily   insulin aspart  0-15 Units Subcutaneous TID WC   insulin aspart  0-5 Units Subcutaneous QHS   isosorbide mononitrate  30 mg Oral Daily   pantoprazole  40 mg Oral Daily   rosuvastatin  40 mg Oral Daily   senna-docusate  1 tablet Oral BID   sertraline  100 mg Oral Daily   tamsulosin  0.4 mg Oral QPC supper   Continuous Infusions:  heparin 1,200 Units/hr (11/06/20 2155)   PRN Meds: acetaminophen, magnesium hydroxide, nitroGLYCERIN, ondansetron (ZOFRAN) IV   Vital Signs    Vitals:   11/07/20 0329 11/07/20 0335 11/07/20 0650 11/07/20 0657  BP: (!) 118/59 (!) 112/56 (!) 146/72 116/64  Pulse:  75 73 78  Resp:  18    Temp:  98 F (36.7 C)    TempSrc:  Oral    SpO2:  97% 96% 98%  Weight:      Height:        Intake/Output Summary (Last 24 hours) at 11/07/2020 0715 Last data filed at 11/07/2020 0600 Gross per 24 hour  Intake --  Output 700 ml  Net -700 ml   Last 3 Weights 11/06/2020 11/05/2020 08/01/2020  Weight (lbs) 179 lb 12.8 oz 194 lb 0.1 oz 194 lb 6 oz  Weight (kg) 81.557 kg 88 kg 88.168 kg      Telemetry    Normal sinus rhythm with rates in the 60s to 80s. - Personally  Reviewed  ECG    Normal sinus rhythm, rate 79 bpm, with RBBB but no acute ischemic changes. - Personally Reviewed  Physical Exam   GEN: No acute distress.   Neck: No JVD. Cardiac: RRR. No murmurs, rubs, or gallops. Right radial cath site soft with no signs of hematoma. Respiratory: Clear to auscultation bilaterally. No wheezes, rhonchi, or rales. GI: Soft, non-distended, and non-tender. MS: No lower extremity edema. No deformity. Skin: Warm and dry. Neuro:  No focal deficits. Psych: Normal affect. Responds appropriately.   Labs    High Sensitivity Troponin:   Recent Labs  Lab 11/05/20 2152 11/06/20 0003  TROPONINIHS 5 9     Chemistry Recent Labs  Lab 11/05/20 2152 11/07/20 0247  NA 139 136  K 3.6 4.1  CL 108 108  CO2 24 23  GLUCOSE 108* 106*  BUN 33* 22  CREATININE 0.82 0.80  CALCIUM 9.3 8.9  PROT 7.2  --   ALBUMIN 4.1  --   AST 26  --   ALT 30  --   ALKPHOS 75  --   BILITOT 1.0  --   GFRNONAA >60 >60  ANIONGAP 7 5    Lipids  Recent Labs  Lab 11/06/20 0003  CHOL 132  TRIG 143  HDL 36*  LDLCALC 67  CHOLHDL 3.7    Hematology Recent Labs  Lab 11/05/20 2152 11/06/20 0857 11/07/20 0247  WBC 7.7 7.6 7.6  RBC 4.78 5.14 4.64  HGB 14.1 14.8 13.0  HCT 40.8 43.9 39.4  MCV 85.4 85.4 84.9  MCH 29.5 28.8 28.0  MCHC 34.6 33.7 33.0  RDW 14.4 14.6 14.8  PLT 173 174 163   Thyroid No results for input(s): TSH, FREET4 in the last 168 hours.  BNPNo results for input(s): BNP, PROBNP in the last 168 hours.  DDimer No results for input(s): DDIMER in the last 168 hours.   Radiology    CARDIAC CATHETERIZATION  Result Date: 11/06/2020 Conclusions: Severe three-vessel coronary artery disease, as detailed below. Low normal left ventricular systolic function (LVEF 35-36%) with mid and apical inferior hypokinesis.  Mildly elevated left ventricular filling pressure (LVEDP 20-25 mmHg). Recommendations: Transfer to [facility] for cardiac surgery consultation. Hold  clopidogrel pending surgical consultation. Restart IV heparin 2 hours after TR band removal. Aggressive secondary prevention. Nelva Bush, MD [facility]  DG ABD ACUTE 2+V W 1V CHEST  Result Date: 11/05/2020 CLINICAL DATA:  Substernal chest pain EXAM: DG ABDOMEN ACUTE WITH 1 VIEW CHEST COMPARISON:  None. FINDINGS: There is no evidence of dilated bowel loops or free intraperitoneal air. Cholecystectomy clips are noted within the right upper quadrant. No radiopaque calculi or other significant radiographic abnormality is seen. Heart size and mediastinal contours are within normal limits. Abbott lungs are clear. IMPRESSION: Negative abdominal radiographs.  No acute cardiopulmonary disease. Electronically Signed   By: Fidela Salisbury M.D.   On: 11/05/2020 22:49    Cardiac Studies   Left Cardiac Catheterization 11/06/2020: Conclusions: Severe three-vessel coronary artery disease, as detailed below. Low normal left ventricular systolic function (LVEF 14-43%) with mid and apical inferior hypokinesis.  Mildly elevated left ventricular filling pressure (LVEDP 20-25 mmHg).   Recommendations: Transfer to [facility] for cardiac surgery consultation. Hold clopidogrel pending surgical consultation. Restart IV heparin 2 hours after TR band removal. Aggressive secondary prevention.  Diagnostic Dominance: Co-dominant    Patient Profile     75 y.o. male with a history of CAD s/p remote PCI to RCA and then MI in 2014 s/p PCI x2 to LAD, sarcoidosis of the lung, COPD, hypertension, hyperlipidemia, hepatitis, PTSD from 3 tours in [place], depression, prior tobacco use (quit in 1984), and alcohol use who was admitted at [facility] on 11/06/2020 with unstable angina. Cardiac catheterization showed severe 3-vessel CAD. Patient was transferred to [facility] for [facility] consultation.  Assessment & Plan    Unstable Angina History of CAD s/p Multiple PCI - High-sensitivity negative x2. - EKG shows no acute  ischemic changes.  - LHC on 9/27 showed severe 3 vessel disease as noted above. Transferred to [facility] yesterday for CT Surgery evaluation.  - Patient had recurrent chest pain this morning requiring 3 doses of sublingual Nitro. Currently chest pain free. Will stop Imdur and start IV Nitro drip. - Continue IV Heparin. - Plavix stopped yesterday. Continue Aspirin. - Continue Coreg 12.5mg  twice daily and Crestor 40mg  daily/Zetia 10mg  daily.  Hypertension - BP well controlled. Systolic BP in the low 154M after 3 doses of sublingual Nitro. - Continue Coreg as above. - Will start IV Nitro as above given recurrent chest pain. Will need to watch BP closely with this.  Hyperlipidemia - Lipid panel on 11/06/2020: Total Cholesterol 132,  Triglycerides 143, HDL 36, LDL 67. - Continue Crestor 40mg  daily and Zetia 10mg  daily.   Type 2 Diabetes Mellitus - Hemoglobin A1c 6.2 this admission. - Continue sliding scale insulin.  COPD Pulmonary Sarcoidosis - Stable. No wheezing.  PTSD - Continue home Zoloft.  GERD - Continue home Protonix.  For questions or updates, please contact [place] Please consult [email or web address] for contact info under        Signed, Darreld Mclean, PA-C  11/07/2020, 7:15 AM    History and all data above reviewed.  Patient examined.  I agree with the findings as above.   Chest pain earlier this am.  Pain free with IV NTG.   The patient exam reveals COR:RRR  ,  Lungs: Clear  ,  Abd: Positive bowel sounds, no rebound no guarding, Ext Right radial intact without bleeding or bruising  .  All available labs, radiology testing, previous records reviewed. Agree with documented assessment and plan. Unstable angina:  Plan for TCTS consult.  Continue IV NTG.    Jeneen Rinks Milca Sytsma  12:25 PM  11/07/2020

## 2020-11-07 NOTE — Progress Notes (Signed)
ANTICOAGULATION CONSULT NOTE - Follow Up Consult  Pharmacy Consult for IV Heparin Indication: chest pain/ACS  Allergies  Allergen Reactions   Peanut-Containing Drug Products Swelling    Swelling, rash, (Peanuts)    Patient Measurements: Height: 5\' 11"  (180.3 cm) Weight: 81.6 kg (179 lb 12.8 oz) IBW/kg (Calculated) : 75.3 Heparin Dosing Weight: 82 kg  Vital Signs: Temp: 98.3 F (36.8 C) (09/28 0319) Temp Source: Oral (09/28 0319) BP: 112/56 (09/28 0335) Pulse Rate: 75 (09/28 0335)  Labs: Recent Labs    11/05/20 2152 11/06/20 0003 11/06/20 0857 11/07/20 0247  HGB 14.1  --  14.8 13.0  HCT 40.8  --  43.9 39.4  PLT 173  --  174 163  APTT 28  --   --   --   LABPROT 13.1  --   --   --   INR 1.0  --   --   --   HEPARINUNFRC  --   --  0.59 0.47  CREATININE 0.82  --   --  0.80  TROPONINIHS 5 9  --   --     Estimated Creatinine Clearance: 85 mL/min (by C-G formula based on SCr of 0.8 mg/dL).   Assessment: 75 yr old man with PMH relevant for CAD, COPD, DM, GERD, HTN, HLD, and sarcoidosis presented to [facility] ER with acute chest pain. Pharmacy was consulted to manage heparin infusion for ACS/chest pain.  Pt is S/P cardiac cath, which showed severe 3-vessel disease. Heparin infusion was resumed at [facility] at 1200 units/hr post cath today ~1700 (pt had therapeutic heparin level of 0.59 units/ml on this infusion rate today pre cath). H/H WNL, stable. Per RN, pt had some bleeding at heparin IV site; site has been changed, with no further issues.  HL came back therapeutic this AM. We will continue the current rate.   Goal of Therapy:  Heparin level 0.3-0.7 units/ml Monitor platelets by anticoagulation protocol: Yes  Plan:  Continue heparin infusion at 1200 units/hr Monitor daily heparin level, CBC Monitor for bleeding F/U CABG plans  Onnie Boer, PharmD, BCIDP, AAHIVP, CPP Infectious Disease Pharmacist 11/07/2020 3:38 AM

## 2020-11-07 NOTE — Progress Notes (Addendum)
Pt was complaining of 10/10 chest pain, 12-lead EKG was obtained, 2 SL nitroglycerin administered, placed on 3L [place], and Nipp, MD made aware. Pt had relief of chest pain and stated 0/10 pain with BP of 112/56 MAP 73 & oxygen saturation of 97%. Will continue to monitor.   Elaina Hoops, RN

## 2020-11-07 NOTE — Plan of Care (Signed)
  Problem: Health Behavior/Discharge Planning: Goal: Ability to manage health-related needs will improve 11/07/2020 0121 by Tobe Sos, RN Outcome: Progressing 11/07/2020 0118 by Tobe Sos, RN Outcome: Progressing   Problem: Clinical Measurements: Goal: Ability to maintain clinical measurements within normal limits will improve 11/07/2020 0121 by Tobe Sos, RN Outcome: Progressing 11/07/2020 0118 by Tobe Sos, RN Outcome: Progressing Goal: Will remain free from infection 11/07/2020 0121 by Tobe Sos, RN Outcome: Progressing 11/07/2020 0118 by Tobe Sos, RN Outcome: Progressing Goal: Diagnostic test results will improve 11/07/2020 0121 by Tobe Sos, RN Outcome: Progressing 11/07/2020 0118 by Tobe Sos, RN Outcome: Progressing Goal: Respiratory complications will improve 11/07/2020 0121 by Tobe Sos, RN Outcome: Progressing 11/07/2020 0118 by Tobe Sos, RN Outcome: Progressing Goal: Cardiovascular complication will be avoided 11/07/2020 0121 by Tobe Sos, RN Outcome: Progressing 11/07/2020 0118 by Tobe Sos, RN Outcome: Progressing   Problem: Activity: Goal: Risk for activity intolerance will decrease 11/07/2020 0121 by Tobe Sos, RN Outcome: Progressing 11/07/2020 0118 by Tobe Sos, RN Outcome: Progressing   Problem: Nutrition: Goal: Adequate nutrition will be maintained 11/07/2020 0121 by Tobe Sos, RN Outcome: Progressing 11/07/2020 0118 by Tobe Sos, RN Outcome: Progressing

## 2020-11-08 ENCOUNTER — Other Ambulatory Visit (HOSPITAL_COMMUNITY): Payer: Medicare Other

## 2020-11-08 DIAGNOSIS — I200 Unstable angina: Secondary | ICD-10-CM | POA: Diagnosis not present

## 2020-11-08 LAB — BASIC METABOLIC PANEL
Anion gap: 10 (ref 5–15)
BUN: 14 mg/dL (ref 8–23)
CO2: 23 mmol/L (ref 22–32)
Calcium: 8.8 mg/dL — ABNORMAL LOW (ref 8.9–10.3)
Chloride: 106 mmol/L (ref 98–111)
Creatinine, Ser: 0.85 mg/dL (ref 0.61–1.24)
GFR, Estimated: >60 mL/min (ref >60)
Glucose, Bld: 111 mg/dL — ABNORMAL HIGH (ref 70–99)
Potassium: 3.8 mmol/L (ref 3.5–5.1)
Sodium: 139 mmol/L (ref 135–145)

## 2020-11-08 LAB — HEPARIN LEVEL (UNFRACTIONATED): Heparin Unfractionated: 0.31 IU/mL (ref 0.30–0.70)

## 2020-11-08 LAB — GLUCOSE, CAPILLARY
Glucose-Capillary: 117 mg/dL — ABNORMAL HIGH (ref 70–99)
Glucose-Capillary: 124 mg/dL — ABNORMAL HIGH (ref 70–99)
Glucose-Capillary: 143 mg/dL — ABNORMAL HIGH (ref 70–99)
Glucose-Capillary: 100 mg/dL — ABNORMAL HIGH (ref 70–99)

## 2020-11-08 LAB — CBC
HCT: 39.6 % (ref 39.0–52.0)
Hemoglobin: 13.2 g/dL (ref 13.0–17.0)
MCH: 27.9 pg (ref 26.0–34.0)
MCHC: 33.3 g/dL (ref 30.0–36.0)
MCV: 83.7 fL (ref 80.0–100.0)
Platelets: 174 10*3/uL (ref 150–400)
RBC: 4.73 MIL/uL (ref 4.22–5.81)
RDW: 14.7 % (ref 11.5–15.5)
WBC: 7.4 10*3/uL (ref 4.0–10.5)
nRBC: 0 % (ref 0.0–0.2)

## 2020-11-08 MED ORDER — ENSURE ENLIVE PO LIQD
237.0000 mL | Freq: Two times a day (BID) | ORAL | Status: DC
  Administered 2020-11-08 – 2020-11-19 (×14): 237 mL via ORAL

## 2020-11-08 MED ORDER — ADULT MULTIVITAMIN W/MINERALS CH
1.0000 | ORAL_TABLET | Freq: Every day | ORAL | Status: DC
  Administered 2020-11-08 – 2020-11-20 (×13): 1 via ORAL
  Filled 2020-11-08 (×13): qty 1

## 2020-11-08 MED ORDER — POLYETHYLENE GLYCOL 3350 17 G PO PACK
17.0000 g | PACK | Freq: Two times a day (BID) | ORAL | Status: DC
  Administered 2020-11-08 – 2020-11-09 (×3): 17 g via ORAL
  Filled 2020-11-08 (×4): qty 1

## 2020-11-08 MED ORDER — FLEET ENEMA 7-19 GM/118ML RE ENEM
1.0000 | ENEMA | Freq: Once | RECTAL | Status: AC
  Administered 2020-11-08: 1 via RECTAL

## 2020-11-08 NOTE — Plan of Care (Signed)
°  Problem: Clinical Measurements: °Goal: Ability to maintain clinical measurements within normal limits will improve °Outcome: Progressing °Goal: Will remain free from infection °Outcome: Progressing °Goal: Diagnostic test results will improve °Outcome: Progressing °Goal: Respiratory complications will improve °Outcome: Progressing °  °

## 2020-11-08 NOTE — Progress Notes (Signed)
Pt c/o of severe rectal pain. On inspection pt hard very hard stool at tip of rectum. Pt was unable to pass stool. Pt had been getting mom and senna without help. Fleets enema ordered due to severity of pain and impaction. Pt was able to pass stool after enema, pain relieved. Small amount of bleeding noted after stool passed. Cont to monitor.Carroll Kinds RN

## 2020-11-08 NOTE — Progress Notes (Signed)
ANTICOAGULATION CONSULT NOTE - Follow Up Consult  Pharmacy Consult for IV Heparin Indication: chest pain/ACS  Allergies  Allergen Reactions   Peanut-Containing Drug Products Swelling    Swelling, rash, (Peanuts)    Patient Measurements: Height: 5\' 11"  (180.3 cm) Weight: 81.6 kg (179 lb 12.8 oz) IBW/kg (Calculated) : 75.3 Heparin Dosing Weight: 82 kg  Vital Signs: Temp: 97.7 F (36.5 C) (09/29 1324) Temp Source: Oral (09/29 1324) BP: 110/98 (09/29 1324) Pulse Rate: 75 (09/29 1324)  Labs: Recent Labs    11/05/20 2152 11/06/20 0003 11/06/20 0857 11/07/20 0247 11/08/20 0319  HGB 14.1  --  14.8 13.0 13.2  HCT 40.8  --  43.9 39.4 39.6  PLT 173  --  174 163 174  APTT 28  --   --   --   --   LABPROT 13.1  --   --   --   --   INR 1.0  --   --   --   --   HEPARINUNFRC  --   --  0.59 0.47 0.31  CREATININE 0.82  --   --  0.80 0.85  TROPONINIHS 5 9  --   --   --     Estimated Creatinine Clearance: 80 mL/min (by C-G formula based on SCr of 0.85 mg/dL).   Assessment: 75 yr old man with PMH relevant for CAD, COPD, DM, GERD, HTN, HLD, and sarcoidosis presented to [facility] ER with acute chest pain. Pharmacy was consulted to manage heparin infusion for ACS/chest pain.  Pt is S/P cardiac cath, which showed severe 3-vessel disease. Plans are noted for Plan is for CABG on 11/13/2020. -heparin level at goal on 1200 units/hr -CBC stable  Goal of Therapy:  Heparin level 0.3-0.7 units/ml Monitor platelets by anticoagulation protocol: Yes  Plan:  Continue heparin infusion at 1200 units/hr Monitor daily heparin level, CBC  Hildred Laser, PharmD Clinical Pharmacist **Pharmacist phone directory can now be found on [email or web address] ([email or web address]).  Listed under [place].

## 2020-11-08 NOTE — Progress Notes (Signed)
Initial Nutrition Assessment  DOCUMENTATION CODES:   Non-severe (moderate) malnutrition in context of chronic illness  INTERVENTION:   Ensure Enlive po BID, each supplement provides 350 kcal and 20 grams of protein MVI with minerals daily  NUTRITION DIAGNOSIS:   Moderate Malnutrition related to chronic illness (CAD) as evidenced by mild muscle depletion, mild fat depletion.  GOAL:   Patient will meet greater than or equal to 90% of their needs  MONITOR:   PO intake, Supplement acceptance, Labs  REASON FOR ASSESSMENT:   Malnutrition Screening Tool    ASSESSMENT:   75 yo male admitted with chest pain, multivessel CAD. PMH includes CAD, DM-2, HTN, HLD, hepatitis, sarcoidosis of the lung, COPD, prior tobacco use, PTSD, ETOH abuse, depression.  Plan for CABG next week.   Patient reports that he has lost some weight over the past few months which he attributes to a change in his eating habits and a decrease in his stress levels. He has stopped eating a lot of junk food. He has recently been constipated, but had a BM today and feels better now. He usually drinks a protein shake for breakfast. He agreed to drink an Ensure Plus/Enlive BID to maximize protein and calorie intake.   Discussed drinking plenty of fluids to help prevent constipation. He states that he has been drinking less fluids since admission. Also reviewed heart healthy diet guidelines, including decreasing sodium and saturated fat intake.   Labs reviewed.  CBG: 117-124  Medications reviewed and include Novolog SSI, Protonix, Miralax, Senokot-S, Flomax.  7.5% weight loss within 3 months is significant for the time frame. Patient meets criteria for moderate malnutrition with mild depletion of muscle and subcutaneous fat mass.   NUTRITION - FOCUSED PHYSICAL EXAM:  Flowsheet Row Most Recent Value  Orbital Region Mild depletion  Upper Arm Region Mild depletion  Thoracic and Lumbar Region Mild depletion  Buccal  Region Moderate depletion  Temple Region Mild depletion  Clavicle Bone Region Mild depletion  Clavicle and Acromion Bone Region Mild depletion  Scapular Bone Region No depletion  Dorsal Hand No depletion  Patellar Region No depletion  Anterior Thigh Region No depletion  Posterior Calf Region No depletion  Edema (RD Assessment) None  Hair Reviewed  Eyes Reviewed  Mouth Reviewed  Skin Reviewed  Nails Reviewed       Diet Order:   Diet Order             Diet heart healthy/carb modified Room service appropriate? Yes; Fluid consistency: Thin  Diet effective now                   EDUCATION NEEDS:   Education needs have been addressed  Skin:  Skin Assessment: Reviewed RN Assessment  Last BM:  9/28  Height:   Ht Readings from Last 1 Encounters:  11/06/20 5\' 11"  (1.803 m)    Weight:   Wt Readings from Last 1 Encounters:  11/06/20 81.6 kg    BMI:  Body mass index is 25.08 kg/m.  Estimated Nutritional Needs:   Kcal:  2000-2200  Protein:  100-115 gm  Fluid:  2 L   Lucas Mallow, RD, LDN, CNSC Please refer to Amion for contact information.

## 2020-11-08 NOTE — Progress Notes (Signed)
CARDIAC REHAB PHASE I   Preop education completed with pt. Pt given IS, able to demonstrate ~2250. Pt educated on importance of IS use, ambulation, and sternal precautions after surgery. Pt given in-the-tube sheet along with cardiac surgery booklet and [facility] care guide. Pt states he is not allowed to have anyone stay with him after d/c, and does not have anyone he can go stay with. Will continue to follow.  [place], RN BSN 11/08/2020 3:13 PM

## 2020-11-08 NOTE — Progress Notes (Addendum)
Progress Note  Patient Name: Ricky Abbott Date of Encounter: 11/08/2020  [facility] Cardiologist: Ida Rogue, MD   Subjective   No acute overnight events. No recurrent chest pain, on Nitro drip. No shortness of breath or palpitations. Plan is for CABG next week after Plavix washout.  Inpatient Medications    Scheduled Meds:  aspirin EC  81 mg Oral Daily   carvedilol  12.5 mg Oral BID WC   ezetimibe  10 mg Oral Daily   insulin aspart  0-15 Units Subcutaneous TID WC   insulin aspart  0-5 Units Subcutaneous QHS   mouth rinse  15 mL Mouth Rinse BID   mupirocin ointment  1 application Nasal BID   pantoprazole  40 mg Oral Daily   rosuvastatin  40 mg Oral Daily   senna-docusate  1 tablet Oral BID   sertraline  100 mg Oral Daily   tamsulosin  0.4 mg Oral QPC supper   Continuous Infusions:  heparin 1,200 Units/hr (11/07/20 2037)   nitroGLYCERIN 5 mcg/min (11/07/20 1836)   PRN Meds: acetaminophen, magnesium hydroxide, nitroGLYCERIN, ondansetron (ZOFRAN) IV   Vital Signs    Vitals:   11/07/20 1516 11/07/20 1937 11/07/20 1957 11/08/20 0534  BP: 121/68 104/69  (!) 117/56  Pulse: 74 75  73  Resp: 16 18 20 19   Temp: 97.7 F (36.5 C) 98.1 F (36.7 C) 98.7 F (37.1 C) 98.7 F (37.1 C)  TempSrc: Oral Oral Oral Oral  SpO2: 97% 96%  95%  Weight:      Height:        Intake/Output Summary (Last 24 hours) at 11/08/2020 0750 Last data filed at 11/08/2020 0600 Gross per 24 hour  Intake 563.16 ml  Output 1450 ml  Net -886.84 ml   Last 3 Weights 11/06/2020 11/05/2020 08/01/2020  Weight (lbs) 179 lb 12.8 oz 194 lb 0.1 oz 194 lb 6 oz  Weight (kg) 81.557 kg 88 kg 88.168 kg      Telemetry    Normal sinus rhythm with rates in the 60s to 70s at rest and a few spikes in the 90s. - Personally Reviewed  ECG    Normal sinus rhythm, rate 71 bpm, with RBBB but no acute ischemic changes. Left axis deviation. QTc 491 ms. - Personally Reviewed  Physical Exam   GEN: No acute  distress.   Neck: No JVD. Cardiac: RRR. No murmurs, rubs, or gallops. Right radial cath site soft with no signs of hematoma. Respiratory: Clear to auscultation bilaterally. No wheezes, rhonchi, or rales. GI: Soft, non-distended, and non-tender. MS: No lower extremity edema. No deformity. Skin: Warm and dry. Neuro:  No focal deficits. Psych: Normal affect. Responds appropriately.  Labs    High Sensitivity Troponin:   Recent Labs  Lab 11/05/20 2152 11/06/20 0003  TROPONINIHS 5 9     Chemistry Recent Labs  Lab 11/05/20 2152 11/07/20 0247  NA 139 136  K 3.6 4.1  CL 108 108  CO2 24 23  GLUCOSE 108* 106*  BUN 33* 22  CREATININE 0.82 0.80  CALCIUM 9.3 8.9  PROT 7.2  --   ALBUMIN 4.1  --   AST 26  --   ALT 30  --   ALKPHOS 75  --   BILITOT 1.0  --   GFRNONAA >60 >60  ANIONGAP 7 5    Lipids  Recent Labs  Lab 11/06/20 0003  CHOL 132  TRIG 143  HDL 36*  LDLCALC 67  CHOLHDL 3.7  Hematology Recent Labs  Lab 11/06/20 0857 11/07/20 0247 11/08/20 0319  WBC 7.6 7.6 7.4  RBC 5.14 4.64 4.73  HGB 14.8 13.0 13.2  HCT 43.9 39.4 39.6  MCV 85.4 84.9 83.7  MCH 28.8 28.0 27.9  MCHC 33.7 33.0 33.3  RDW 14.6 14.8 14.7  PLT 174 163 174   Thyroid No results for input(s): TSH, FREET4 in the last 168 hours.  BNPNo results for input(s): BNP, PROBNP in the last 168 hours.  DDimer No results for input(s): DDIMER in the last 168 hours.   Radiology    CARDIAC CATHETERIZATION  Result Date: 11/06/2020 Conclusions: Severe three-vessel coronary artery disease, as detailed below. Low normal left ventricular systolic function (LVEF 31-54%) with mid and apical inferior hypokinesis.  Mildly elevated left ventricular filling pressure (LVEDP 20-25 mmHg). Recommendations: Transfer to [facility] for cardiac surgery consultation. Hold clopidogrel pending surgical consultation. Restart IV heparin 2 hours after TR band removal. Aggressive secondary prevention. Nelva Bush, MD [facility]  ECHOCARDIOGRAM COMPLETE  Result Date: 11/07/2020    ECHOCARDIOGRAM REPORT   Patient Name:   Ricky Abbott Date of Exam: 11/07/2020 Medical Rec #:  [id number]         Height:       71.0 in Accession #:    [id number]        Weight:       179.8 lb Date of Birth:  May 20, 1945         BSA:          2.015 m Patient Age:    75 years          BP:           133/72 mmHg Patient Gender: M                 HR:           72 bpm. Exam Location:  Inpatient Procedure: 2D Echo, Color Doppler and Cardiac Doppler Indications:    NSTEMI  History:        Patient has no prior history of Echocardiogram examinations.                 Risk Factors:Dyslipidemia.  Sonographer:    Raquel Sarna Senior RDCS Referring Phys: [place]  1. Distal septal and inferior hypokinesis . Left ventricular ejection fraction, by estimation, is 45 to 50%. The left ventricle has mildly decreased function. The left ventricle demonstrates regional wall motion abnormalities (see scoring diagram/findings for description). There is mild left ventricular hypertrophy. Left ventricular diastolic parameters were normal.  2. Right ventricular systolic function is normal. The right ventricular size is normal.  3. The mitral valve is abnormal. Trivial mitral valve regurgitation. No evidence of mitral stenosis.  4. The aortic valve is tricuspid. Aortic valve regurgitation is not visualized. Mild to moderate aortic valve sclerosis/calcification is present, without any evidence of aortic stenosis.  5. The inferior vena cava is normal in size with greater than 50% respiratory variability, suggesting right atrial pressure of 3 mmHg. FINDINGS  Left Ventricle: Distal septal and inferior hypokinesis. Left ventricular ejection fraction, by estimation, is 45 to 50%. The left ventricle has mildly decreased function. The left ventricle demonstrates regional wall motion abnormalities. The left ventricular internal cavity size was normal in size. There is  mild left ventricular hypertrophy. Left ventricular diastolic parameters were normal. Right Ventricle: The right ventricular size is normal. No increase in right ventricular wall thickness. Right ventricular systolic function is  normal. Left Atrium: Left atrial size was normal in size. Right Atrium: Right atrial size was normal in size. Pericardium: There is no evidence of pericardial effusion. Mitral Valve: The mitral valve is abnormal. There is mild thickening of the mitral valve leaflet(s). There is mild calcification of the mitral valve leaflet(s). Mild mitral annular calcification. Trivial mitral valve regurgitation. No evidence of mitral valve stenosis. Tricuspid Valve: The tricuspid valve is normal in structure. Tricuspid valve regurgitation is not demonstrated. No evidence of tricuspid stenosis. Aortic Valve: The aortic valve is tricuspid. Aortic valve regurgitation is not visualized. Mild to moderate aortic valve sclerosis/calcification is present, without any evidence of aortic stenosis. Pulmonic Valve: The pulmonic valve was normal in structure. Pulmonic valve regurgitation is not visualized. No evidence of pulmonic stenosis. Aorta: The aortic root is normal in size and structure. Venous: The inferior vena cava is normal in size with greater than 50% respiratory variability, suggesting right atrial pressure of 3 mmHg. IAS/Shunts: No atrial level shunt detected by color flow Doppler.  LEFT VENTRICLE PLAX 2D LVIDd:         4.60 cm  Diastology LVIDs:         3.60 cm  LV e' medial:    4.90 cm/s LV PW:         1.20 cm  LV E/e' medial:  17.4 LV IVS:        1.20 cm  LV e' lateral:   8.81 cm/s LVOT diam:     2.00 cm  LV E/e' lateral: 9.7 LV SV:         64 LV SV Index:   32 LVOT Area:     3.14 cm  RIGHT VENTRICLE RV S prime:     12.30 cm/s TAPSE (M-mode): 2.3 cm LEFT ATRIUM             Index       RIGHT ATRIUM           Index LA diam:        3.10 cm 1.54 cm/m  RA Area:     15.60 cm LA Vol (A2C):   63.6 ml 31.56  ml/m RA Volume:   37.50 ml  18.61 ml/m LA Vol (A4C):   59.4 ml 29.47 ml/m LA Biplane Vol: 62.4 ml 30.96 ml/m  AORTIC VALVE LVOT Vmax:   90.40 cm/s LVOT Vmean:  66.300 cm/s LVOT VTI:    0.205 m  AORTA Ao Root diam: 3.40 cm Ao Asc diam:  3.40 cm MITRAL VALVE MV Area (PHT): 3.39 cm     SHUNTS MV Decel Time: 224 msec     Systemic VTI:  0.20 m MV E velocity: 85.10 cm/s   Systemic Diam: 2.00 cm MV A velocity: 113.00 cm/s MV E/A ratio:  0.75 Jenkins Rouge MD Electronically signed by Jenkins Rouge MD Signature Date/Time: 11/07/2020/10:19:01 AM    Final     Cardiac Studies   Left Cardiac Catheterization 11/06/2020: Conclusions: Severe three-vessel coronary artery disease, as detailed below. Low normal left ventricular systolic function (LVEF 91-47%) with mid and apical inferior hypokinesis.  Mildly elevated left ventricular filling pressure (LVEDP 20-25 mmHg).   Recommendations: Transfer to [facility] for cardiac surgery consultation. Hold clopidogrel pending surgical consultation. Restart IV heparin 2 hours after TR band removal. Aggressive secondary prevention.   Diagnostic Dominance: Co-dominant  _______________  Echocardiogram 11/07/2020: Impressions: 1. Distal septal and inferior hypokinesis . Left ventricular ejection  fraction, by estimation, is 45 to 50%. The left ventricle has mildly  decreased function. The left ventricle demonstrates regional wall motion  abnormalities (see scoring  diagram/findings for description). There is mild left ventricular  hypertrophy. Left ventricular diastolic parameters were normal.   2. Right ventricular systolic function is normal. The right ventricular  size is normal.   3. The mitral valve is abnormal. Trivial mitral valve regurgitation. No  evidence of mitral stenosis.   4. The aortic valve is tricuspid. Aortic valve regurgitation is not  visualized. Mild to moderate aortic valve sclerosis/calcification is  present, without any evidence of aortic  stenosis.   5. The inferior vena cava is normal in size with greater than 50%  respiratory variability, suggesting right atrial pressure of 3 mmHg.   Patient Profile     75 y.o. male with a history of CAD s/p remote PCI to RCA and then MI in 2014 s/p PCI x2 to LAD, sarcoidosis of the lung, COPD, hypertension, hyperlipidemia, hepatitis, PTSD from 3 tours in [place], depression, prior tobacco use (quit in 1984), and alcohol use who was admitted at [facility] on 11/06/2020 with unstable angina. Cardiac catheterization showed severe 3-vessel CAD. Patient was transferred to [facility] for [facility] consultation.  Assessment & Plan    Unstable Angina History of CAD s/p Multiple PCI - High-sensitivity negative x2. - EKG shows no acute ischemic changes.  - LHC on 9/27 showed severe 3 vessel disease as noted above.  - Echo showed LVEF of 45-50% with distal septal and inferior hypokinesis and mild LVH. No significant valvular disease noted. - Chest pain free. - Continue IV Heparin and IV Nitro. - Plavix stopped on 9/27. Continue Aspirin. - Continue Coreg 12.5mg  twice daily and Crestor 40mg  daily/Zetia 10mg  daily. - Patient has been seen by CT Surgery. Plan is for CABG on 11/13/2020 after Plavix washout. Pre-CABG dopplers.   Hypertension - BP well controlled.  - Continue VI Nitro. - Continue Coreg 12.5mg  twice daily.   Hyperlipidemia - Lipid panel on 11/06/2020: Total Cholesterol 132, Triglycerides 143, HDL 36, LDL 67. - Continue Crestor 40mg  daily and Zetia 10mg  daily.    Type 2 Diabetes Mellitus - Hemoglobin A1c 6.2 this admission. - Continue sliding scale insulin.   COPD Pulmonary Sarcoidosis - Stable. No wheezing. - Pre-CABG PFTs completed yesterday.   PTSD - Continue home Zoloft.   GERD - Continue home Protonix.  For questions or updates, please contact [place] Please consult [email or web address] for contact info under        Signed, Darreld Mclean, PA-C  11/08/2020, 7:50 AM     History and all data above reviewed.  Patient examined.  I agree with the findings as above.  No further chest pain.  No SOB.  The patient exam reveals COR:RRR  ,  Lungs: Clear  ,  Abd: Positive bowel sounds, no rebound no guarding, Ext No edema  .  All available labs, radiology testing, previous records reviewed. Agree with documented assessment and plan. CAD:  Plan for CABG on Tuesday.  Continue current IV NTG and heparin given unstable symptoms this admission.  Constipation:  Resolved.   Jeneen Rinks Joh Rao  11:02 AM  11/08/2020

## 2020-11-09 ENCOUNTER — Inpatient Hospital Stay (HOSPITAL_COMMUNITY): Payer: Medicare Other

## 2020-11-09 DIAGNOSIS — Z01810 Encounter for preprocedural cardiovascular examination: Secondary | ICD-10-CM

## 2020-11-09 DIAGNOSIS — I200 Unstable angina: Secondary | ICD-10-CM | POA: Diagnosis not present

## 2020-11-09 DIAGNOSIS — E440 Moderate protein-calorie malnutrition: Secondary | ICD-10-CM | POA: Insufficient documentation

## 2020-11-09 LAB — CBC
HCT: 42.4 % (ref 39.0–52.0)
Hemoglobin: 13.9 g/dL (ref 13.0–17.0)
MCH: 27.6 pg (ref 26.0–34.0)
MCHC: 32.8 g/dL (ref 30.0–36.0)
MCV: 84.3 fL (ref 80.0–100.0)
Platelets: 207 10*3/uL (ref 150–400)
RBC: 5.03 MIL/uL (ref 4.22–5.81)
RDW: 14.8 % (ref 11.5–15.5)
WBC: 9.1 10*3/uL (ref 4.0–10.5)
nRBC: 0 % (ref 0.0–0.2)

## 2020-11-09 LAB — BASIC METABOLIC PANEL
Anion gap: 10 (ref 5–15)
BUN: 15 mg/dL (ref 8–23)
CO2: 25 mmol/L (ref 22–32)
Calcium: 9.4 mg/dL (ref 8.9–10.3)
Chloride: 104 mmol/L (ref 98–111)
Creatinine, Ser: 0.96 mg/dL (ref 0.61–1.24)
GFR, Estimated: >60 mL/min (ref >60)
Glucose, Bld: 131 mg/dL — ABNORMAL HIGH (ref 70–99)
Potassium: 4.2 mmol/L (ref 3.5–5.1)
Sodium: 139 mmol/L (ref 135–145)

## 2020-11-09 LAB — GLUCOSE, CAPILLARY
Glucose-Capillary: 134 mg/dL — ABNORMAL HIGH (ref 70–99)
Glucose-Capillary: 138 mg/dL — ABNORMAL HIGH (ref 70–99)
Glucose-Capillary: 117 mg/dL — ABNORMAL HIGH (ref 70–99)
Glucose-Capillary: 134 mg/dL — ABNORMAL HIGH (ref 70–99)

## 2020-11-09 LAB — HEPARIN LEVEL (UNFRACTIONATED): Heparin Unfractionated: 0.46 IU/mL (ref 0.30–0.70)

## 2020-11-09 LAB — TROPONIN I (HIGH SENSITIVITY): Troponin I (High Sensitivity): 26 ng/L — ABNORMAL HIGH (ref <18)

## 2020-11-09 MED ORDER — MORPHINE SULFATE (PF) 2 MG/ML IV SOLN
2.0000 mg | INTRAVENOUS | Status: AC | PRN
  Administered 2020-11-09: 2 mg via INTRAVENOUS

## 2020-11-09 MED ORDER — MORPHINE SULFATE (PF) 2 MG/ML IV SOLN
INTRAVENOUS | Status: AC
  Administered 2020-11-09: 2 mg via INTRAVENOUS
  Filled 2020-11-09: qty 1

## 2020-11-09 NOTE — Significant Event (Signed)
Rapid Response Event Note   Reason for Call :  Chest pain despite ntg gtt titration.  Initial Focused Assessment:  Pt lying in bed with eyes open, grimaces. Pt is alert and oriented, c/o 9/10 sharp R sided chest pain. Pt says this pain feels like it did when he had his previous heart attack. Lungs diminished t/o. Skin pale, warm, diaphoretic.   HR-80s, SBP-150s, RR-22, SpO2-100% on 4L [place].   Interventions:  EKG-NSR. ST dep in V3-V5 NTG gtt titrated up to 84mcg (from 64mcg) 4mg  zofran IV 2mg  morphine IV Plan of Care:  Pt is chest pain free after interventions. EKG has also improved. Morphine ordered x 1 additional dose if needed. Continue to monitor pt closely. Call RRT if further assistance needed.    Event Summary:   MD Notified: Dr. Kalman Shan  notified and came to bedside Call [place], Christain Niznik Anderson, RN

## 2020-11-09 NOTE — Care Management Important Message (Signed)
Important Message  Patient Details  Name: Ricky Abbott MRN: [id number] Date of Birth: 18-Nov-1945   Medicare Important Message Given:  Yes     Shelda Altes 11/09/2020, 10:02 AM

## 2020-11-09 NOTE — Progress Notes (Signed)
[email or web address] chest pain 6/10 with NTG gtt infusing at 25mcg /min..V/S taken & titrated to 31mcg /hr . At [place] ,Pt.still c/o chest pain.7/10. V/S taken & titrated NTG gtt.to 15 mcg /min. EKG  done.Still pt.c/o chest  pain  & stated pain is increasing 9/10. Titrated again NTG gtt to 20 mcg /min.after checking V/S.Rapid response nurse Mindy was called & came to see pt.Dr.Rose was also called & made aware & ordered trop. &  said he is coming also to see pt.

## 2020-11-09 NOTE — Progress Notes (Signed)
Pre-CABG testing has been completed. Preliminary results can be found in CV Proc through chart review.   11/09/20 11:53 AM Ricky Abbott RVT

## 2020-11-09 NOTE — Progress Notes (Addendum)
Progress Note  Patient Name: Ricky Abbott Date of Encounter: 11/09/2020  [facility] Cardiologist: Ida Rogue, MD   Subjective   Patient had recurrent chest pain earlier this morning. He described it a pressure with very severe sharp pain that felt like a "lightening strike." He states this is the worse pain he has had. Also was diaphoretic, nauseous and short of breath. EKG showed normal sinus rhythm with ST elevation in AVR and new ST depression in V3-V6. High-sensitivity troponin ordered and came back slightly elevated at 26 (up from 5 >> 9). IV Nitro drip was up-titrated and he was given a dose of Morphine with resolution. No recurrent chest pain since.  Inpatient Medications    Scheduled Meds:  aspirin EC  81 mg Oral Daily   carvedilol  12.5 mg Oral BID WC   ezetimibe  10 mg Oral Daily   feeding supplement  237 mL Oral BID BM   insulin aspart  0-15 Units Subcutaneous TID WC   insulin aspart  0-5 Units Subcutaneous QHS   mouth rinse  15 mL Mouth Rinse BID   multivitamin with minerals  1 tablet Oral Daily   mupirocin ointment  1 application Nasal BID   pantoprazole  40 mg Oral Daily   polyethylene glycol  17 g Oral BID   rosuvastatin  40 mg Oral Daily   senna-docusate  1 tablet Oral BID   sertraline  100 mg Oral Daily   tamsulosin  0.4 mg Oral QPC supper   Continuous Infusions:  heparin 1,200 Units/hr (11/08/20 1916)   nitroGLYCERIN 25 mcg/min (11/09/20 0716)   PRN Meds: acetaminophen, magnesium hydroxide, nitroGLYCERIN, ondansetron (ZOFRAN) IV   Vital Signs    Vitals:   11/09/20 0409 11/09/20 0414 11/09/20 0444 11/09/20 0529  BP: (!) 94/52 99/60 (!) 97/52 (!) 95/52  Pulse: 85 84 72 66  Resp: 17     Temp: 98.2 F (36.8 C)     TempSrc: Oral     SpO2: 98% 97% 100% 100%  Weight:      Height:       No intake or output data in the 24 hours ending 11/09/20 0743 Last 3 Weights 11/06/2020 11/05/2020 08/01/2020  Weight (lbs) 179 lb 12.8 oz 194 lb 0.1 oz 194  lb 6 oz  Weight (kg) 81.557 kg 88 kg 88.168 kg      Telemetry    Normal sinus rhythm with rates in the 60s to 80s. - Personally Reviewed  ECG    Normal sinus rhythm, rate 84 bpm, with RBBB, ST elevation in aVR and ST depression in V3-V6 (new from prior EKG). Repeat EKG at - Personally Reviewed  Physical Exam   GEN: Caucasian male. No acute distress.   Neck: No JVD. Cardiac: RRR. No murmurs, rubs, or gallops. Right radial cath site soft with no signs of hematoma. Respiratory: Clear to auscultation bilaterally. No wheezes, rhonchi, or rales. GI: Soft, non-distended, and non-tender. MS: No lower extremity edema. No deformity. Skin: Warm and dry. Neuro:  No focal deficits. Psych: Normal affect. Responds appropriately.  Labs    High Sensitivity Troponin:   Recent Labs  Lab 11/05/20 2152 11/06/20 0003 11/09/20 0207  TROPONINIHS 5 9 26*     Chemistry Recent Labs  Lab 11/05/20 2152 11/07/20 0247 11/08/20 0319 11/09/20 0207  NA 139 136 139 139  K 3.6 4.1 3.8 4.2  CL 108 108 106 104  CO2 24 23 23 25   GLUCOSE 108* 106* 111* 131*  BUN 33* 22 14 15   CREATININE 0.82 0.80 0.85 0.96  CALCIUM 9.3 8.9 8.8* 9.4  PROT 7.2  --   --   --   ALBUMIN 4.1  --   --   --   AST 26  --   --   --   ALT 30  --   --   --   ALKPHOS 75  --   --   --   BILITOT 1.0  --   --   --   GFRNONAA >60 >60 >60 >60  ANIONGAP 7 5 10 10     Lipids  Recent Labs  Lab 11/06/20 0003  CHOL 132  TRIG 143  HDL 36*  LDLCALC 67  CHOLHDL 3.7    Hematology Recent Labs  Lab 11/07/20 0247 11/08/20 0319 11/09/20 0207  WBC 7.6 7.4 9.1  RBC 4.64 4.73 5.03  HGB 13.0 13.2 13.9  HCT 39.4 39.6 42.4  MCV 84.9 83.7 84.3  MCH 28.0 27.9 27.6  MCHC 33.0 33.3 32.8  RDW 14.8 14.7 14.8  PLT 163 174 207   Thyroid No results for input(s): TSH, FREET4 in the last 168 hours.  BNPNo results for input(s): BNP, PROBNP in the last 168 hours.  DDimer No results for input(s): DDIMER in the last 168 hours.    Radiology    ECHOCARDIOGRAM COMPLETE  Result Date: 11/07/2020    ECHOCARDIOGRAM REPORT   Patient Name:   Ricky Abbott Date of Exam: 11/07/2020 Medical Rec #:  [id number]         Height:       71.0 in Accession #:    [id number]        Weight:       179.8 lb Date of Birth:  May 30, 1945         BSA:          2.015 m Patient Age:    75 years          BP:           133/72 mmHg Patient Gender: M                 HR:           72 bpm. Exam Location:  Inpatient Procedure: 2D Echo, Color Doppler and Cardiac Doppler Indications:    NSTEMI  History:        Patient has no prior history of Echocardiogram examinations.                 Risk Factors:Dyslipidemia.  Sonographer:    Raquel Sarna Senior RDCS Referring Phys: [place]  1. Distal septal and inferior hypokinesis . Left ventricular ejection fraction, by estimation, is 45 to 50%. The left ventricle has mildly decreased function. The left ventricle demonstrates regional wall motion abnormalities (see scoring diagram/findings for description). There is mild left ventricular hypertrophy. Left ventricular diastolic parameters were normal.  2. Right ventricular systolic function is normal. The right ventricular size is normal.  3. The mitral valve is abnormal. Trivial mitral valve regurgitation. No evidence of mitral stenosis.  4. The aortic valve is tricuspid. Aortic valve regurgitation is not visualized. Mild to moderate aortic valve sclerosis/calcification is present, without any evidence of aortic stenosis.  5. The inferior vena cava is normal in size with greater than 50% respiratory variability, suggesting right atrial pressure of 3 mmHg. FINDINGS  Left Ventricle: Distal septal and inferior hypokinesis. Left ventricular ejection fraction, by estimation, is 45 to  50%. The left ventricle has mildly decreased function. The left ventricle demonstrates regional wall motion abnormalities. The left ventricular internal cavity size was normal in size.  There is mild left ventricular hypertrophy. Left ventricular diastolic parameters were normal. Right Ventricle: The right ventricular size is normal. No increase in right ventricular wall thickness. Right ventricular systolic function is normal. Left Atrium: Left atrial size was normal in size. Right Atrium: Right atrial size was normal in size. Pericardium: There is no evidence of pericardial effusion. Mitral Valve: The mitral valve is abnormal. There is mild thickening of the mitral valve leaflet(s). There is mild calcification of the mitral valve leaflet(s). Mild mitral annular calcification. Trivial mitral valve regurgitation. No evidence of mitral valve stenosis. Tricuspid Valve: The tricuspid valve is normal in structure. Tricuspid valve regurgitation is not demonstrated. No evidence of tricuspid stenosis. Aortic Valve: The aortic valve is tricuspid. Aortic valve regurgitation is not visualized. Mild to moderate aortic valve sclerosis/calcification is present, without any evidence of aortic stenosis. Pulmonic Valve: The pulmonic valve was normal in structure. Pulmonic valve regurgitation is not visualized. No evidence of pulmonic stenosis. Aorta: The aortic root is normal in size and structure. Venous: The inferior vena cava is normal in size with greater than 50% respiratory variability, suggesting right atrial pressure of 3 mmHg. IAS/Shunts: No atrial level shunt detected by color flow Doppler.  LEFT VENTRICLE PLAX 2D LVIDd:         4.60 cm  Diastology LVIDs:         3.60 cm  LV e' medial:    4.90 cm/s LV PW:         1.20 cm  LV E/e' medial:  17.4 LV IVS:        1.20 cm  LV e' lateral:   8.81 cm/s LVOT diam:     2.00 cm  LV E/e' lateral: 9.7 LV SV:         64 LV SV Index:   32 LVOT Area:     3.14 cm  RIGHT VENTRICLE RV S prime:     12.30 cm/s TAPSE (M-mode): 2.3 cm LEFT ATRIUM             Index       RIGHT ATRIUM           Index LA diam:        3.10 cm 1.54 cm/m  RA Area:     15.60 cm LA Vol (A2C):   63.6  ml 31.56 ml/m RA Volume:   37.50 ml  18.61 ml/m LA Vol (A4C):   59.4 ml 29.47 ml/m LA Biplane Vol: 62.4 ml 30.96 ml/m  AORTIC VALVE LVOT Vmax:   90.40 cm/s LVOT Vmean:  66.300 cm/s LVOT VTI:    0.205 m  AORTA Ao Root diam: 3.40 cm Ao Asc diam:  3.40 cm MITRAL VALVE MV Area (PHT): 3.39 cm     SHUNTS MV Decel Time: 224 msec     Systemic VTI:  0.20 m MV E velocity: 85.10 cm/s   Systemic Diam: 2.00 cm MV A velocity: 113.00 cm/s MV E/A ratio:  0.75 Jenkins Rouge MD Electronically signed by Jenkins Rouge MD Signature Date/Time: 11/07/2020/10:19:01 AM    Final     Cardiac Studies   Left Cardiac Catheterization 11/06/2020: Conclusions: Severe three-vessel coronary artery disease, as detailed below. Low normal left ventricular systolic function (LVEF 29-56%) with mid and apical inferior hypokinesis.  Mildly elevated left ventricular filling pressure (LVEDP 20-25 mmHg).   Recommendations:  Transfer to [facility] for cardiac surgery consultation. Hold clopidogrel pending surgical consultation. Restart IV heparin 2 hours after TR band removal. Aggressive secondary prevention.   Diagnostic Dominance: Co-dominant _______________   Echocardiogram 11/07/2020: Impressions: 1. Distal septal and inferior hypokinesis . Left ventricular ejection  fraction, by estimation, is 45 to 50%. The left ventricle has mildly  decreased function. The left ventricle demonstrates regional wall motion  abnormalities (see scoring  diagram/findings for description). There is mild left ventricular  hypertrophy. Left ventricular diastolic parameters were normal.   2. Right ventricular systolic function is normal. The right ventricular  size is normal.   3. The mitral valve is abnormal. Trivial mitral valve regurgitation. No  evidence of mitral stenosis.   4. The aortic valve is tricuspid. Aortic valve regurgitation is not  visualized. Mild to moderate aortic valve sclerosis/calcification is  present, without any evidence of  aortic stenosis.   5. The inferior vena cava is normal in size with greater than 50%  respiratory variability, suggesting right atrial pressure of 3 mmHg.   Patient Profile     75 y.o. male with a history of CAD s/p remote PCI to RCA and then MI in 2014 s/p PCI x2 to LAD, sarcoidosis of the lung, COPD, hypertension, hyperlipidemia, hepatitis, PTSD from 3 tours in [place], depression, prior tobacco use (quit in 1984), and alcohol use who was admitted at [facility] on 11/06/2020 with unstable angina. Cardiac catheterization showed severe 3-vessel CAD. Patient was transferred to [facility] for [facility] consultation.  Assessment & Plan    Unstable Angina History of CAD s/p Multiple PCI - LHC on 9/27 showed severe 3 vessel disease as noted above.  - Echo showed LVEF of 45-50% with distal septal and inferior hypokinesis and mild LVH. No significant valvular disease noted. - Patient had recurrent chest pain this morning. EKG showed normal sinus rhythm with ST elevation in AVR and new ST depression in V3-V6. High-sensitivity troponin slightly elevated at 26 (up from 5 >> 9). IV Nitro drip up-titrated and patient given a dose of Morphine. No recurrent pain since. - Continue IV Heparin and IV Nitro. - Plavix stopped on 9/27. Continue Aspirin. - Continue Coreg 12.5mg  twice daily and Crestor 40mg  daily/Zetia 10mg  daily. - Patient has been seen by CT Surgery. Plan is for CABG on 11/13/2020 after Plavix washout. If patient has more severe chest pain or becomes unstable, will have to have more urgent surgery. Pre-CABG dopplers pending. PFTs have been completed.    Hypertension - BP soft at times but stable.  - Continue IV Nitro. - Continue Coreg 12.5mg  twice daily. - If BP drops, may need to stop Coreg in order to allow for Nitro drip.   Hyperlipidemia - Lipid panel on 11/06/2020: Total Cholesterol 132, Triglycerides 143, HDL 36, LDL 67. - Continue Crestor 40mg  daily and Zetia 10mg  daily.    Type 2 Diabetes  [place] - Hemoglobin A1c 6.2 this admission. - Continue sliding scale insulin.   COPD Pulmonary Sarcoidosis - Stable. No wheezing. - Pre-CABG PFTs completed on 11/07/2020.   PTSD - Continue home Zoloft.   GERD - Continue home Protonix.  For questions or updates, please contact [place] Please consult [email or web address] for contact info under        Signed, Darreld Mclean, PA-C  11/09/2020, 7:43 AM    History and all data above reviewed.  Patient examined.  I agree with the findings as above.  Severe episode of chest pain last night with acute  ST depression on EKG.  Managed with increased nitrates and now pain free. The patient exam reveals COR:RRR  ,  Lungs: Clear  ,  Abd: Positive bowel sounds, no rebound no guarding, Ext No edema  .  All available labs, radiology testing, previous records reviewed. Agree with documented assessment and plan.   Unstable angina:  Managed as above.  Continue to follow closely on heparin and IV NTG.  Constipation:  He has had some red blood on his stools but no anemia.  No longer constipated. We will follow.   Sarah Baez  12:00 PM  11/09/2020

## 2020-11-09 NOTE — Progress Notes (Signed)
ANTICOAGULATION CONSULT NOTE - Follow Up Consult  Pharmacy Consult for IV Heparin Indication: chest pain/ACS  Allergies  Allergen Reactions   Peanut-Containing Drug Products Swelling    Swelling, rash, (Peanuts)    Patient Measurements: Height: 5\' 11"  (180.3 cm) Weight: 81.6 kg (179 lb 12.8 oz) IBW/kg (Calculated) : 75.3 Heparin Dosing Weight: 82 kg  Vital Signs: Temp: 98.2 F (36.8 C) (09/30 0409) Temp Source: Oral (09/30 0409) BP: 104/53 (09/30 0900) Pulse Rate: 81 (09/30 0900)  Labs: Recent Labs    11/07/20 0247 11/08/20 0319 11/09/20 0207  HGB 13.0 13.2 13.9  HCT 39.4 39.6 42.4  PLT 163 174 207  HEPARINUNFRC 0.47 0.31 0.46  CREATININE 0.80 0.85 0.96  TROPONINIHS  --   --  26*    Estimated Creatinine Clearance: 70.8 mL/min (by C-G formula based on SCr of 0.96 mg/dL).   Assessment: 75 yr old man with PMH relevant for CAD, COPD, DM, GERD, HTN, HLD, and sarcoidosis presented to [facility] ER with acute chest pain. Pharmacy was consulted to manage heparin infusion for ACS/chest pain.  Pt is S/P cardiac cath, which showed severe 3-vessel disease. Plans are noted for Plan is for CABG on 11/13/2020.  Heparin level continues to be at goal this morning. No bleeding or IV issues noted. CBC stable.   Goal of Therapy:  Heparin level 0.3-0.7 units/ml Monitor platelets by anticoagulation protocol: Yes  Plan:  Continue heparin infusion at 1200 units/hr Monitor daily heparin level, CBC  Erin Hearing PharmD., BCPS Clinical Pharmacist 11/09/2020 9:22 AM

## 2020-11-09 NOTE — Plan of Care (Signed)
  Problem: Health Behavior/Discharge Planning: Goal: Ability to manage health-related needs will improve Outcome: Progressing   Problem: Clinical Measurements: Goal: Ability to maintain clinical measurements within normal limits will improve Outcome: Progressing Goal: Will remain free from infection Outcome: Progressing Goal: Diagnostic test results will improve Outcome: Progressing Goal: Respiratory complications will improve Outcome: Progressing Goal: Cardiovascular complication will be avoided Outcome: Progressing   Problem: Activity: Goal: Risk for activity intolerance will decrease Outcome: Progressing   Problem: Nutrition: Goal: Adequate nutrition will be maintained Outcome: Progressing   Problem: Education: Goal: Understanding of CV disease, CV risk reduction, and recovery process will improve Outcome: Progressing Goal: Individualized Educational Video(s) Outcome: Progressing   Problem: Activity: Goal: Ability to return to baseline activity level will improve Outcome: Progressing   Problem: Cardiovascular: Goal: Ability to achieve and maintain adequate cardiovascular perfusion will improve Outcome: Progressing Goal: Vascular access site(s) Level 0-1 will be maintained Outcome: Progressing   Problem: Health Behavior/Discharge Planning: Goal: Ability to safely manage health-related needs after discharge will improve Outcome: Progressing

## 2020-11-09 NOTE — Plan of Care (Signed)
  Problem: Health Behavior/Discharge Planning: Goal: Ability to manage health-related needs will improve Outcome: Progressing   Problem: Clinical Measurements: Goal: Ability to maintain clinical measurements within normal limits will improve Outcome: Progressing   Problem: Clinical Measurements: Goal: Diagnostic test results will improve Outcome: Progressing   Problem: Clinical Measurements: Goal: Cardiovascular complication will be avoided Outcome: Progressing   Problem: Education: Goal: Understanding of CV disease, CV risk reduction, and recovery process will improve Outcome: Progressing   Problem: Cardiovascular: Goal: Ability to achieve and maintain adequate cardiovascular perfusion will improve Outcome: Progressing   Problem: Health Behavior/Discharge Planning: Goal: Ability to safely manage health-related needs after discharge will improve Outcome: Progressing

## 2020-11-10 DIAGNOSIS — I200 Unstable angina: Secondary | ICD-10-CM | POA: Diagnosis not present

## 2020-11-10 LAB — CBC
HCT: 37.4 % — ABNORMAL LOW (ref 39.0–52.0)
Hemoglobin: 12 g/dL — ABNORMAL LOW (ref 13.0–17.0)
MCH: 27.7 pg (ref 26.0–34.0)
MCHC: 32.1 g/dL (ref 30.0–36.0)
MCV: 86.4 fL (ref 80.0–100.0)
Platelets: 164 10*3/uL (ref 150–400)
RBC: 4.33 MIL/uL (ref 4.22–5.81)
RDW: 15 % (ref 11.5–15.5)
WBC: 6.8 10*3/uL (ref 4.0–10.5)
nRBC: 0 % (ref 0.0–0.2)

## 2020-11-10 LAB — BASIC METABOLIC PANEL
Anion gap: 6 (ref 5–15)
BUN: 16 mg/dL (ref 8–23)
CO2: 27 mmol/L (ref 22–32)
Calcium: 8.7 mg/dL — ABNORMAL LOW (ref 8.9–10.3)
Chloride: 105 mmol/L (ref 98–111)
Creatinine, Ser: 0.86 mg/dL (ref 0.61–1.24)
GFR, Estimated: >60 mL/min (ref >60)
Glucose, Bld: 108 mg/dL — ABNORMAL HIGH (ref 70–99)
Potassium: 3.9 mmol/L (ref 3.5–5.1)
Sodium: 138 mmol/L (ref 135–145)

## 2020-11-10 LAB — HEPARIN LEVEL (UNFRACTIONATED): Heparin Unfractionated: 0.34 IU/mL (ref 0.30–0.70)

## 2020-11-10 LAB — SURGICAL PCR SCREEN
MRSA, PCR: NEGATIVE
Staphylococcus aureus: POSITIVE — AB

## 2020-11-10 LAB — GLUCOSE, CAPILLARY
Glucose-Capillary: 120 mg/dL — ABNORMAL HIGH (ref 70–99)
Glucose-Capillary: 132 mg/dL — ABNORMAL HIGH (ref 70–99)
Glucose-Capillary: 110 mg/dL — ABNORMAL HIGH (ref 70–99)
Glucose-Capillary: 182 mg/dL — ABNORMAL HIGH (ref 70–99)

## 2020-11-10 MED ORDER — CYCLOBENZAPRINE HCL 10 MG PO TABS
5.0000 mg | ORAL_TABLET | Freq: Once | ORAL | Status: AC
  Administered 2020-11-10: 5 mg via ORAL
  Filled 2020-11-10: qty 1

## 2020-11-10 NOTE — Progress Notes (Signed)
Progress Note  Patient Name: Ricky Abbott Date of Encounter: 11/10/2020  [facility] Cardiologist: Ida Rogue, MD   Subjective   He again had chest pain last night and had to have his NTG increased.  Also some right calf pain.   Inpatient Medications    Scheduled Meds:  aspirin EC  81 mg Oral Daily   carvedilol  12.5 mg Oral BID WC   ezetimibe  10 mg Oral Daily   feeding supplement  237 mL Oral BID BM   insulin aspart  0-15 Units Subcutaneous TID WC   insulin aspart  0-5 Units Subcutaneous QHS   multivitamin with minerals  1 tablet Oral Daily   mupirocin ointment  1 application Nasal BID   pantoprazole  40 mg Oral Daily   rosuvastatin  40 mg Oral Daily   senna-docusate  1 tablet Oral BID   sertraline  100 mg Oral Daily   tamsulosin  0.4 mg Oral QPC supper   Continuous Infusions:  heparin 1,200 Units/hr (11/10/20 0600)   nitroGLYCERIN 30 mcg/min (11/10/20 0600)   PRN Meds: acetaminophen, magnesium hydroxide, nitroGLYCERIN, ondansetron (ZOFRAN) IV   Vital Signs    Vitals:   11/10/20 0343 11/10/20 0400 11/10/20 0432 11/10/20 0812  BP: 133/71 (!) 111/56 114/61 (!) 111/58  Pulse: 81 75 72 82  Resp: 19 19  18   Temp:  98.3 F (36.8 C)  98.2 F (36.8 C)  TempSrc:  Oral  Oral  SpO2: 97% 92%  95%  Weight:      Height:        Intake/Output Summary (Last 24 hours) at 11/10/2020 1119 Last data filed at 11/10/2020 0600 Gross per 24 hour  Intake 1109.89 ml  Output 1200 ml  Net -90.11 ml   Last 3 Weights 11/06/2020 11/05/2020 08/01/2020  Weight (lbs) 179 lb 12.8 oz 194 lb 0.1 oz 194 lb 6 oz  Weight (kg) 81.557 kg 88 kg 88.168 kg      Telemetry    NSR - Personally Reviewed  ECG    NSR, rate 76, RBBB, resolution of previous ST depression.  11/10/2020    Physical Exam   GEN: No  acute distress.   Neck: No  JVD Cardiac: RRR, no murmurs, rubs, or gallops.  Respiratory: Clear   to auscultation bilaterally. GI: Soft, nontender, non-distended, normal  bowel sounds  MS:  No edema; No deformity. Neuro:   Nonfocal  Psych: Oriented and appropriate    Labs    High Sensitivity Troponin:   Recent Labs  Lab 11/05/20 2152 11/06/20 0003 11/09/20 0207  TROPONINIHS 5 9 26*     Chemistry Recent Labs  Lab 11/05/20 2152 11/07/20 0247 11/08/20 0319 11/09/20 0207 11/10/20 0150  NA 139   < > 139 139 138  K 3.6   < > 3.8 4.2 3.9  CL 108   < > 106 104 105  CO2 24   < > 23 25 27   GLUCOSE 108*   < > 111* 131* 108*  BUN 33*   < > 14 15 16   CREATININE 0.82   < > 0.85 0.96 0.86  CALCIUM 9.3   < > 8.8* 9.4 8.7*  PROT 7.2  --   --   --   --   ALBUMIN 4.1  --   --   --   --   AST 26  --   --   --   --   ALT 30  --   --   --   --  ALKPHOS 75  --   --   --   --   BILITOT 1.0  --   --   --   --   GFRNONAA >60   < > >60 >60 >60  ANIONGAP 7   < > 10 10 6    < > = values in this interval not displayed.    Lipids  Recent Labs  Lab 11/06/20 0003  CHOL 132  TRIG 143  HDL 36*  LDLCALC 67  CHOLHDL 3.7    Hematology Recent Labs  Lab 11/08/20 0319 11/09/20 0207 11/10/20 0150  WBC 7.4 9.1 6.8  RBC 4.73 5.03 4.33  HGB 13.2 13.9 12.0*  HCT 39.6 42.4 37.4*  MCV 83.7 84.3 86.4  MCH 27.9 27.6 27.7  MCHC 33.3 32.8 32.1  RDW 14.7 14.8 15.0  PLT 174 207 164   Thyroid No results for input(s): TSH, FREET4 in the last 168 hours.  BNPNo results for input(s): BNP, PROBNP in the last 168 hours.  DDimer No results for input(s): DDIMER in the last 168 hours.   Radiology    VAS US DOPPLER PRE CABG  Result Date: 11/09/2020 PREOPERATIVE VASCULAR EVALUATION Patient Name:  KHRISTOPHER KAPAUN  Date of Exam:   11/09/2020 Medical Rec #: [id number]          Accession #:    [id number] Date of Birth: 1945/07/19          Patient Gender: M Patient Age:   75 years Exam Location:  [facility] Procedure:      VAS US DOPPLER PRE CABG Referring Phys: HARRELL LIGHTFOOT --------------------------------------------------------------------------------   Indications:      Pre-CABG. Risk Factors:     Hypertension, hyperlipidemia, Diabetes. Comparison Study: No prior studies. Performing Technologist: Carlos Levering RVT  Examination Guidelines: A complete evaluation includes B-mode imaging, spectral Doppler, color Doppler, and power Doppler as needed of all accessible portions of each vessel. Bilateral testing is considered an integral part of a complete examination. Limited examinations for reoccurring indications may be performed as noted.  Right Carotid Findings: +----------+--------+--------+--------+-----------------------+--------+           PSV cm/sEDV cm/sStenosisDescribe               Comments +----------+--------+--------+--------+-----------------------+--------+ CCA Prox  51      11              smooth and heterogenous         +----------+--------+--------+--------+-----------------------+--------+ CCA Distal57      14              smooth and heterogenous         +----------+--------+--------+--------+-----------------------+--------+ ICA Prox  76      24              smooth and heterogenous         +----------+--------+--------+--------+-----------------------+--------+ ICA Distal52      15                                     tortuous +----------+--------+--------+--------+-----------------------+--------+ ECA       168     22                                              +----------+--------+--------+--------+-----------------------+--------+ +----------+--------+-------+--------+------------+  PSV cm/sEDV cmsDescribeArm Pressure +----------+--------+-------+--------+------------+ Subclavian122                                 +----------+--------+-------+--------+------------+ +---------+--------+--+--------+-+---------+ VertebralPSV cm/s28EDV cm/s9Antegrade +---------+--------+--+--------+-+---------+ Left Carotid Findings:  +----------+--------+--------+--------+-----------------------+--------+           PSV cm/sEDV cm/sStenosisDescribe               Comments +----------+--------+--------+--------+-----------------------+--------+ CCA Prox  66      16              smooth and heterogenous         +----------+--------+--------+--------+-----------------------+--------+ CCA Distal78      17              smooth and heterogenous         +----------+--------+--------+--------+-----------------------+--------+ ICA Prox  82      22              smooth and heterogenous         +----------+--------+--------+--------+-----------------------+--------+ ICA Distal51      20                                     tortuous +----------+--------+--------+--------+-----------------------+--------+ ECA       118     11                                              +----------+--------+--------+--------+-----------------------+--------+  +----------+--------+--------+--------+------------+ SubclavianPSV cm/sEDV cm/sDescribeArm Pressure +----------+--------+--------+--------+------------+           125                                  +----------+--------+--------+--------+------------+ +---------+--------+--+--------+--+---------+ VertebralPSV cm/s31EDV cm/s10Antegrade +---------+--------+--+--------+--+---------+  ABI Findings: +--------+------------------+-----+----------+--------+ Right   Rt Pressure (mmHg)IndexWaveform  Comment  +--------+------------------+-----+----------+--------+ [id number]                    triphasic          +--------+------------------+-----+----------+--------+ PTA     118               1.03 biphasic           +--------+------------------+-----+----------+--------+ DP      108               0.94 monophasic         +--------+------------------+-----+----------+--------+ +--------+------------------+-----+---------+-------+ Left    Lt Pressure  (mmHg)IndexWaveform Comment +--------+------------------+-----+---------+-------+ [id number]                    triphasic        +--------+------------------+-----+---------+-------+ PTA     119               1.03 triphasic        +--------+------------------+-----+---------+-------+ DP      124               1.08 triphasic        +--------+------------------+-----+---------+-------+ +-------+---------------+----------------+ ABI/TBIToday's ABI/TBIPrevious ABI/TBI +-------+---------------+----------------+ Right  1.03                            +-------+---------------+----------------+ Left   1.08                            +-------+---------------+----------------+  Right Doppler Findings: +--------+--------+-----+---------+--------+ Site    PressureIndexDoppler  Comments +--------+--------+-----+---------+--------+ [id number]          triphasic         +--------+--------+-----+---------+--------+ Radial               triphasic         +--------+--------+-----+---------+--------+ Ulnar                triphasic         +--------+--------+-----+---------+--------+  Left Doppler Findings: +--------+--------+-----+---------+--------+ Site    PressureIndexDoppler  Comments +--------+--------+-----+---------+--------+ [id number]          triphasic         +--------+--------+-----+---------+--------+ Radial               triphasic         +--------+--------+-----+---------+--------+ Ulnar                triphasic         +--------+--------+-----+---------+--------+  Summary: Right Carotid: Velocities in the right ICA are consistent with a 1-39% stenosis. Left Carotid: Velocities in the left ICA are consistent with a 1-39% stenosis. Vertebrals: Bilateral vertebral arteries demonstrate antegrade flow. Right ABI: Resting right ankle-brachial index is within normal range. No evidence of significant right lower extremity arterial disease.  Left ABI: Resting left ankle-brachial index is within normal range. No evidence of significant left lower extremity arterial disease. Right Upper Extremity: Doppler waveform obliterate with right radial compression. Doppler waveform obliterate with right ulnar compression. Left Upper Extremity: Doppler waveform obliterate with left radial compression. Doppler waveforms remain within normal limits with left ulnar compression.     Preliminary     Cardiac Studies   Left Cardiac Catheterization 11/06/2020: Conclusions: Severe three-vessel coronary artery disease, as detailed below. Low normal left ventricular systolic function (LVEF 70-35%) with mid and apical inferior hypokinesis.  Mildly elevated left ventricular filling pressure (LVEDP 20-25 mmHg).   Recommendations: Transfer to [facility] for cardiac surgery consultation. Hold clopidogrel pending surgical consultation. Restart IV heparin 2 hours after TR band removal. Aggressive secondary prevention.   Diagnostic Dominance: Co-dominant _______________   Echocardiogram 11/07/2020: Impressions: 1. Distal septal and inferior hypokinesis . Left ventricular ejection  fraction, by estimation, is 45 to 50%. The left ventricle has mildly  decreased function. The left ventricle demonstrates regional wall motion  abnormalities (see scoring  diagram/findings for description). There is mild left ventricular  hypertrophy. Left ventricular diastolic parameters were normal.   2. Right ventricular systolic function is normal. The right ventricular  size is normal.   3. The mitral valve is abnormal. Trivial mitral valve regurgitation. No  evidence of mitral stenosis.   4. The aortic valve is tricuspid. Aortic valve regurgitation is not  visualized. Mild to moderate aortic valve sclerosis/calcification is  present, without any evidence of aortic stenosis.   5. The inferior vena cava is normal in size with greater than 50%  respiratory variability,  suggesting right atrial pressure of 3 mmHg.   Patient Profile     75 y.o. male with a history of CAD s/p remote PCI to RCA and then MI in 2014 s/p PCI x2 to LAD, sarcoidosis of the lung, COPD, hypertension, hyperlipidemia, hepatitis, PTSD from 3 tours in [place], depression, prior tobacco use (quit in 1984), and alcohol use who was admitted at [facility] on 11/06/2020 with unstable angina. Cardiac catheterization showed severe 3-vessel CAD. Patient was transferred to [facility] for [place] [facility] consultation.  Assessment & Plan    Unstable  Angina History of CAD s/p Multiple PCI.  Continued episodic pain that has responded to increased IV NTG.  BP soft so no other med titration.  Continue heparin and NTG IV   Hypertension Low in the face of therapy above.  No med titration.   Hyperlipidemia Continue high statin.   Type 2 Diabetes Mellitus A1C 6.2.  SSI.    COPD Pulmonary Sarcoidosis.    Moderate obstructive disease on PFT 9/28    PTSD Continue Zoloft.   GERD Continue Protonix.   For questions or updates, please contact [place] Please consult [email or web address] for contact info under        Signed, Minus Breeding, MD  11/10/2020, 11:19 AM

## 2020-11-10 NOTE — Progress Notes (Signed)
ANTICOAGULATION CONSULT NOTE - Follow Up Consult  Pharmacy Consult for IV Heparin Indication: chest pain/ACS  Allergies  Allergen Reactions   Peanut-Containing Drug Products Swelling    Swelling, rash, (Peanuts)    Patient Measurements: Height: 5\' 11"  (180.3 cm) Weight: 81.6 kg (179 lb 12.8 oz) IBW/kg (Calculated) : 75.3 Heparin Dosing Weight: 82 kg  Vital Signs: Temp: 98.2 F (36.8 C) (10/01 0812) Temp Source: Oral (10/01 0812) BP: 111/58 (10/01 0812) Pulse Rate: 82 (10/01 0812)  Labs: Recent Labs    11/08/20 0319 11/09/20 0207 11/10/20 0150  HGB 13.2 13.9 12.0*  HCT 39.6 42.4 37.4*  PLT 174 207 164  HEPARINUNFRC 0.31 0.46 0.34  CREATININE 0.85 0.96 0.86  TROPONINIHS  --  26*  --     Estimated Creatinine Clearance: 79 mL/min (by C-G formula based on SCr of 0.86 mg/dL).   Assessment: 75 yr old man with PMH relevant for CAD, COPD, DM, GERD, HTN, HLD, and sarcoidosis presented to [facility] ER with acute chest pain. Pharmacy was consulted to manage heparin infusion for ACS/chest pain. Pt is S/P cardiac cath, which showed severe 3-vessel disease. Plan is for CABG on 11/13/2020.   Heparin level 0.34 and therapeutic today at 1200 units/h. Hgb and PLT are stable. No signs of bleeding or IV site issues per RN.   Goal of Therapy:  Heparin level 0.3-0.7 units/ml Monitor platelets by anticoagulation protocol: Yes  Plan:  Continue heparin infusion at 1200 units/hr Monitor daily heparin level, CBC Monitor for signs/sx of bleeding  Thank you for involving pharmacy in this patient's care.  Elita Quick, PharmD PGY1 Ambulatory Care Pharmacy Resident 11/10/2020 8:51 AM  **Pharmacist phone directory can be found on [email or web address] listed under [place]**

## 2020-11-10 NOTE — Progress Notes (Signed)
At 0343H, patient woke up and called in due to chest pain. He described the pain as sharp, as if something is sitting on his chest, pain scale of 5/10, substernal, not radiating anywhere else. Patient is not diaphoretic, no nause. Vital signs taken. EKG done. Increased Nitro drip to 30 mcg/mn.  At 0356, patient  verbalized that chest pain is 0/10. Maintained nitro at the same rate. BP is stable. Will monitor.

## 2020-11-11 ENCOUNTER — Inpatient Hospital Stay (HOSPITAL_COMMUNITY): Payer: Medicare Other

## 2020-11-11 DIAGNOSIS — R252 Cramp and spasm: Secondary | ICD-10-CM

## 2020-11-11 DIAGNOSIS — I200 Unstable angina: Secondary | ICD-10-CM | POA: Diagnosis not present

## 2020-11-11 DIAGNOSIS — M79661 Pain in right lower leg: Secondary | ICD-10-CM | POA: Diagnosis not present

## 2020-11-11 LAB — BASIC METABOLIC PANEL
Anion gap: 7 (ref 5–15)
BUN: 16 mg/dL (ref 8–23)
CO2: 27 mmol/L (ref 22–32)
Calcium: 8.9 mg/dL (ref 8.9–10.3)
Chloride: 103 mmol/L (ref 98–111)
Creatinine, Ser: 0.79 mg/dL (ref 0.61–1.24)
GFR, Estimated: >60 mL/min (ref >60)
Glucose, Bld: 126 mg/dL — ABNORMAL HIGH (ref 70–99)
Potassium: 4.2 mmol/L (ref 3.5–5.1)
Sodium: 137 mmol/L (ref 135–145)

## 2020-11-11 LAB — URINALYSIS, ROUTINE W REFLEX MICROSCOPIC
Bilirubin Urine: NEGATIVE
Glucose, UA: NEGATIVE mg/dL
Hgb urine dipstick: NEGATIVE
Ketones, ur: NEGATIVE mg/dL
Leukocytes,Ua: NEGATIVE
Nitrite: NEGATIVE
Protein, ur: NEGATIVE mg/dL
Specific Gravity, Urine: 1.006 (ref 1.005–1.030)
pH: 7 (ref 5.0–8.0)

## 2020-11-11 LAB — CBC
HCT: 36.4 % — ABNORMAL LOW (ref 39.0–52.0)
Hemoglobin: 11.7 g/dL — ABNORMAL LOW (ref 13.0–17.0)
MCH: 27.8 pg (ref 26.0–34.0)
MCHC: 32.1 g/dL (ref 30.0–36.0)
MCV: 86.5 fL (ref 80.0–100.0)
Platelets: 150 10*3/uL (ref 150–400)
RBC: 4.21 MIL/uL — ABNORMAL LOW (ref 4.22–5.81)
RDW: 15.1 % (ref 11.5–15.5)
WBC: 6.7 10*3/uL (ref 4.0–10.5)
nRBC: 0 % (ref 0.0–0.2)

## 2020-11-11 LAB — GLUCOSE, CAPILLARY
Glucose-Capillary: 124 mg/dL — ABNORMAL HIGH (ref 70–99)
Glucose-Capillary: 127 mg/dL — ABNORMAL HIGH (ref 70–99)
Glucose-Capillary: 142 mg/dL — ABNORMAL HIGH (ref 70–99)
Glucose-Capillary: 160 mg/dL — ABNORMAL HIGH (ref 70–99)

## 2020-11-11 LAB — HEPARIN LEVEL (UNFRACTIONATED): Heparin Unfractionated: 0.31 IU/mL (ref 0.30–0.70)

## 2020-11-11 LAB — MAGNESIUM: Magnesium: 1.8 mg/dL (ref 1.7–2.4)

## 2020-11-11 MED ORDER — CYCLOBENZAPRINE HCL 10 MG PO TABS
10.0000 mg | ORAL_TABLET | Freq: Three times a day (TID) | ORAL | Status: DC | PRN
  Administered 2020-11-11 – 2020-11-12 (×3): 10 mg via ORAL
  Filled 2020-11-11 (×4): qty 1

## 2020-11-11 MED ORDER — CHLORHEXIDINE GLUCONATE CLOTH 2 % EX PADS
6.0000 | MEDICATED_PAD | Freq: Every day | CUTANEOUS | Status: DC
Start: 2020-11-11 — End: 2020-11-13
  Administered 2020-11-11 – 2020-11-12 (×2): 6 via TOPICAL

## 2020-11-11 MED ORDER — MAGNESIUM OXIDE -MG SUPPLEMENT 400 (240 MG) MG PO TABS
400.0000 mg | ORAL_TABLET | Freq: Once | ORAL | Status: AC
  Administered 2020-11-11: 400 mg via ORAL
  Filled 2020-11-11: qty 1

## 2020-11-11 MED ORDER — OXYCODONE HCL 5 MG PO TABS
5.0000 mg | ORAL_TABLET | Freq: Once | ORAL | Status: AC | PRN
  Administered 2020-11-11: 5 mg via ORAL
  Filled 2020-11-11: qty 1

## 2020-11-11 NOTE — Progress Notes (Signed)
ANTICOAGULATION CONSULT NOTE - Follow Up Consult  Pharmacy Consult for IV Heparin Indication: chest pain/ACS  Allergies  Allergen Reactions   Peanut-Containing Drug Products Swelling    Swelling, rash, (Peanuts)    Patient Measurements: Height: 5\' 11"  (180.3 cm) Weight: 81.6 kg (179 lb 12.8 oz) IBW/kg (Calculated) : 75.3 Heparin Dosing Weight: 82 kg  Vital Signs: Temp: 98.4 F (36.9 C) (10/02 0909) Temp Source: Oral (10/02 0909) BP: 112/64 (10/02 0909) Pulse Rate: 73 (10/02 0909)  Labs: Recent Labs    11/09/20 0207 11/10/20 0150 11/11/20 0258  HGB 13.9 12.0* 11.7*  HCT 42.4 37.4* 36.4*  PLT 207 164 150  HEPARINUNFRC 0.46 0.34 0.31  CREATININE 0.96 0.86 0.79  TROPONINIHS 26*  --   --     Estimated Creatinine Clearance: 85 mL/min (by C-G formula based on SCr of 0.79 mg/dL).   Assessment: 75 yr old man with PMH relevant for CAD, COPD, DM, GERD, HTN, HLD, and sarcoidosis presented to [facility] ER with acute chest pain. Pharmacy was consulted to manage heparin infusion for ACS/chest pain. Pt is S/P cardiac cath, which showed severe 3-vessel disease. Plan is for CABG on 11/13/2020.   Heparin level 0.31 and therapeutic today at 1200 units/h with a downward trend. Will proactively bump rate to keep in therapeutic range. Heparin levels Hgb and PLT are stable. No signs of bleeding or IV site issues per RN.   Goal of Therapy:  Heparin level 0.3-0.7 units/ml Monitor platelets by anticoagulation protocol: Yes  Plan:  Increase IV heparin gtt to 1250 units/h Monitor daily heparin level, CBC Monitor for signs/sx of bleeding  Thank you for involving pharmacy in this patient's care.  Elita Quick, PharmD PGY1 Ambulatory Care Pharmacy Resident 11/11/2020 11:12 AM  **Pharmacist phone directory can be found on [email or web address] listed under [place]**

## 2020-11-11 NOTE — Progress Notes (Addendum)
Progress Note  Patient Name: Ricky Abbott Date of Encounter: 11/11/2020  [facility] Cardiologist: Ida Rogue, MD   Subjective   He has had cramping calf pain relieved with Flexeril.   He had chest pain again in the middle of the night.  Currently pain free but with pain in the right calf   Inpatient Medications    Scheduled Meds:  aspirin EC  81 mg Oral Daily   carvedilol  12.5 mg Oral BID WC   ezetimibe  10 mg Oral Daily   feeding supplement  237 mL Oral BID BM   insulin aspart  0-15 Units Subcutaneous TID WC   insulin aspart  0-5 Units Subcutaneous QHS   multivitamin with minerals  1 tablet Oral Daily   mupirocin ointment  1 application Nasal BID   pantoprazole  40 mg Oral Daily   rosuvastatin  40 mg Oral Daily   senna-docusate  1 tablet Oral BID   sertraline  100 mg Oral Daily   tamsulosin  0.4 mg Oral QPC supper   Continuous Infusions:  heparin 1,200 Units/hr (11/10/20 1613)   nitroGLYCERIN 35 mcg/min (11/11/20 0021)   PRN Meds: acetaminophen, cyclobenzaprine, magnesium hydroxide, nitroGLYCERIN, ondansetron (ZOFRAN) IV   Vital Signs    Vitals:   11/10/20 1337 11/10/20 1900 11/11/20 0500 11/11/20 0909  BP: (!) 113/50 114/64 (!) 105/57 112/64  Pulse: 69 78 72 73  Resp:  18 18   Temp: 97.7 F (36.5 C) 98.8 F (37.1 C) 98.8 F (37.1 C)   TempSrc: Oral Oral Oral   SpO2: 95% 96% 94% 94%  Weight:      Height:        Intake/Output Summary (Last 24 hours) at 11/11/2020 1036 Last data filed at 11/11/2020 1610 Gross per 24 hour  Intake 541.2 ml  Output 975 ml  Net -433.8 ml   Last 3 Weights 11/06/2020 11/05/2020 08/01/2020  Weight (lbs) 179 lb 12.8 oz 194 lb 0.1 oz 194 lb 6 oz  Weight (kg) 81.557 kg 88 kg 88.168 kg      Telemetry    NSR - Personally Reviewed  ECG    NA  Physical Exam   GEN: No  acute distress.   Neck: No  JVD Cardiac: RRR, no murmurs, rubs, or gallops.  Respiratory: Clear  to auscultation bilaterally. GI: Soft,  nontender, non-distended, normal bowel sounds  MS:  No edema; No deformity.  Tenderness to palpation of the right calf muscle that appears to be contracted.  I do not see bruising or feel venous thrombosis.   Neuro:   Nonfocal  Psych: Oriented and appropriate     Labs    High Sensitivity Troponin:   Recent Labs  Lab 11/05/20 2152 11/06/20 0003 11/09/20 0207  TROPONINIHS 5 9 26*     Chemistry Recent Labs  Lab 11/05/20 2152 11/07/20 0247 11/09/20 0207 11/10/20 0150 11/11/20 0258  NA 139   < > 139 138 137  K 3.6   < > 4.2 3.9 4.2  CL 108   < > 104 105 103  CO2 24   < > 25 27 27   GLUCOSE 108*   < > 131* 108* 126*  BUN 33*   < > 15 16 16   CREATININE 0.82   < > 0.96 0.86 0.79  CALCIUM 9.3   < > 9.4 8.7* 8.9  PROT 7.2  --   --   --   --   ALBUMIN 4.1  --   --   --   --  AST 26  --   --   --   --   ALT 30  --   --   --   --   ALKPHOS 75  --   --   --   --   BILITOT 1.0  --   --   --   --   GFRNONAA >60   < > >60 >60 >60  ANIONGAP 7   < > 10 6 7    < > = values in this interval not displayed.    Lipids  Recent Labs  Lab 11/06/20 0003  CHOL 132  TRIG 143  HDL 36*  LDLCALC 67  CHOLHDL 3.7    Hematology Recent Labs  Lab 11/09/20 0207 11/10/20 0150 11/11/20 0258  WBC 9.1 6.8 6.7  RBC 5.03 4.33 4.21*  HGB 13.9 12.0* 11.7*  HCT 42.4 37.4* 36.4*  MCV 84.3 86.4 86.5  MCH 27.6 27.7 27.8  MCHC 32.8 32.1 32.1  RDW 14.8 15.0 15.1  PLT 207 164 150   Thyroid No results for input(s): TSH, FREET4 in the last 168 hours.  BNPNo results for input(s): BNP, PROBNP in the last 168 hours.  DDimer No results for input(s): DDIMER in the last 168 hours.   Radiology    VAS US DOPPLER PRE CABG  Result Date: 11/11/2020 PREOPERATIVE VASCULAR EVALUATION Patient Name:  Ricky Abbott  Date of Exam:   11/09/2020 Medical Rec #: [id number]          Accession #:    [id number] Date of Birth: 1946/01/04          Patient Gender: M Patient Age:   75 years Exam Location:  [facility] Procedure:      VAS US DOPPLER PRE CABG Referring Phys: HARRELL LIGHTFOOT --------------------------------------------------------------------------------  Indications:      Pre-CABG. Risk Factors:     Hypertension, hyperlipidemia, Diabetes. Comparison Study: No prior studies. Performing Technologist: Carlos Levering RVT  Examination Guidelines: A complete evaluation includes B-mode imaging, spectral Doppler, color Doppler, and power Doppler as needed of all accessible portions of each vessel. Bilateral testing is considered an integral part of a complete examination. Limited examinations for reoccurring indications may be performed as noted.  Right Carotid Findings: +----------+--------+--------+--------+-----------------------+--------+           PSV cm/sEDV cm/sStenosisDescribe               Comments +----------+--------+--------+--------+-----------------------+--------+ CCA Prox  51      11              smooth and heterogenous         +----------+--------+--------+--------+-----------------------+--------+ CCA Distal57      14              smooth and heterogenous         +----------+--------+--------+--------+-----------------------+--------+ ICA Prox  76      24              smooth and heterogenous         +----------+--------+--------+--------+-----------------------+--------+ ICA Distal52      15                                     tortuous +----------+--------+--------+--------+-----------------------+--------+ ECA       168     22                                              +----------+--------+--------+--------+-----------------------+--------+ +----------+--------+-------+--------+------------+  PSV cm/sEDV cmsDescribeArm Pressure +----------+--------+-------+--------+------------+ Subclavian122                                 +----------+--------+-------+--------+------------+ +---------+--------+--+--------+-+---------+  VertebralPSV cm/s28EDV cm/s9Antegrade +---------+--------+--+--------+-+---------+ Left Carotid Findings: +----------+--------+--------+--------+-----------------------+--------+           PSV cm/sEDV cm/sStenosisDescribe               Comments +----------+--------+--------+--------+-----------------------+--------+ CCA Prox  66      16              smooth and heterogenous         +----------+--------+--------+--------+-----------------------+--------+ CCA Distal78      17              smooth and heterogenous         +----------+--------+--------+--------+-----------------------+--------+ ICA Prox  82      22              smooth and heterogenous         +----------+--------+--------+--------+-----------------------+--------+ ICA Distal51      20                                     tortuous +----------+--------+--------+--------+-----------------------+--------+ ECA       118     11                                              +----------+--------+--------+--------+-----------------------+--------+ +----------+--------+--------+--------+------------+ SubclavianPSV cm/sEDV cm/sDescribeArm Pressure +----------+--------+--------+--------+------------+           125                                  +----------+--------+--------+--------+------------+ +---------+--------+--+--------+--+---------+ VertebralPSV cm/s31EDV cm/s10Antegrade +---------+--------+--+--------+--+---------+  ABI Findings: +--------+------------------+-----+----------+--------+ Right   Rt Pressure (mmHg)IndexWaveform  Comment  +--------+------------------+-----+----------+--------+ [id number]                    triphasic          +--------+------------------+-----+----------+--------+ PTA     118               1.03 biphasic           +--------+------------------+-----+----------+--------+ DP      108               0.94 monophasic          +--------+------------------+-----+----------+--------+ +--------+------------------+-----+---------+-------+ Left    Lt Pressure (mmHg)IndexWaveform Comment +--------+------------------+-----+---------+-------+ [id number]                    triphasic        +--------+------------------+-----+---------+-------+ PTA     119               1.03 triphasic        +--------+------------------+-----+---------+-------+ DP      124               1.08 triphasic        +--------+------------------+-----+---------+-------+ +-------+---------------+----------------+ ABI/TBIToday's ABI/TBIPrevious ABI/TBI +-------+---------------+----------------+ Right  1.03                            +-------+---------------+----------------+ Left   1.08                            +-------+---------------+----------------+  Right Doppler Findings: +--------+--------+-----+---------+--------+ Site    PressureIndexDoppler  Comments +--------+--------+-----+---------+--------+ [id number]          triphasic         +--------+--------+-----+---------+--------+ Radial               triphasic         +--------+--------+-----+---------+--------+ Ulnar                triphasic         +--------+--------+-----+---------+--------+  Left Doppler Findings: +--------+--------+-----+---------+--------+ Site    PressureIndexDoppler  Comments +--------+--------+-----+---------+--------+ [id number]          triphasic         +--------+--------+-----+---------+--------+ Radial               triphasic         +--------+--------+-----+---------+--------+ Ulnar                triphasic         +--------+--------+-----+---------+--------+  Summary: Right Carotid: Velocities in the right ICA are consistent with a 1-39% stenosis. Left Carotid: Velocities in the left ICA are consistent with a 1-39% stenosis. Vertebrals: Bilateral vertebral arteries demonstrate antegrade flow. Right  ABI: Resting right ankle-brachial index is within normal range. No evidence of significant right lower extremity arterial disease. Left ABI: Resting left ankle-brachial index is within normal range. No evidence of significant left lower extremity arterial disease. Right Upper Extremity: Doppler waveform obliterate with right radial compression. Doppler waveform obliterate with right ulnar compression. Left Upper Extremity: Doppler waveform obliterate with left radial compression. Doppler waveforms remain within normal limits with left ulnar compression.  Electronically signed by Harold Barban MD on 11/11/2020 at 9:29:57 AM.    Final     Cardiac Studies   Left Cardiac Catheterization 11/06/2020: Conclusions: Severe three-vessel coronary artery disease, as detailed below. Low normal left ventricular systolic function (LVEF 56-21%) with mid and apical inferior hypokinesis.  Mildly elevated left ventricular filling pressure (LVEDP 20-25 mmHg).   Recommendations: Transfer to [facility] for cardiac surgery consultation. Hold clopidogrel pending surgical consultation. Restart IV heparin 2 hours after TR band removal. Aggressive secondary prevention.   Diagnostic Dominance: Co-dominant _______________   Echocardiogram 11/07/2020: Impressions: 1. Distal septal and inferior hypokinesis . Left ventricular ejection  fraction, by estimation, is 45 to 50%. The left ventricle has mildly  decreased function. The left ventricle demonstrates regional wall motion  abnormalities (see scoring  diagram/findings for description). There is mild left ventricular  hypertrophy. Left ventricular diastolic parameters were normal.   2. Right ventricular systolic function is normal. The right ventricular  size is normal.   3. The mitral valve is abnormal. Trivial mitral valve regurgitation. No  evidence of mitral stenosis.   4. The aortic valve is tricuspid. Aortic valve regurgitation is not  visualized. Mild to  moderate aortic valve sclerosis/calcification is  present, without any evidence of aortic stenosis.   5. The inferior vena cava is normal in size with greater than 50%  respiratory variability, suggesting right atrial pressure of 3 mmHg.   Patient Profile     75 y.o. 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 Cardiac catheterization showed severe 3-vessel CAD. Patient was transferred to [facility] for  CT Surgery consultation.  Assessment & Plan    Unstable Angina Continues to have episodic angina and plan for CABG.  Continue heparin and NTG IV.    Hypertension BPs controlled on current therapy.   Hyperlipidemia Continue high dose statin.    Type 2 Diabetes Mellitus A1C 6.2.  SSI.   Patient questions why he is being treated with SSI and we discussed the protocol.    COPD Pulmonary Sarcoidosis.    Moderate obstructive disease on PFT 9/28   .  No change in therapy  CALF CRAMPING:  No objective findings on exam.  Improves with Flexeril.  I will order an ultrasound to look for any etiology.  I don't suspect a ruptured cyst or DVT .  He has been on heparin.  Might all be muscle cramping.     PTSD Continue Zoloft.   GERD Continue Protonix.   For questions or updates, please contact [place] Please consult [email or web address] for contact info under        Signed, Minus Breeding, MD  11/11/2020, 10:36 AM

## 2020-11-11 NOTE — Progress Notes (Signed)
Patient still complaining of right leg pain in his calf that goes to his thigh and foot at times. Patient received flexeril without any relief. I sent Ricky Abbott a secure chat. She gave me a one time dose of oxycodone

## 2020-11-11 NOTE — Progress Notes (Addendum)
Dr. Percival Spanish requested f/u of LE duplex. Per d/w vasc tech, no DVT seen on duplex, nothing resembling muscle tear either. Awaiting finalized report by vascular team. Addendum: nurse reached out to request additional rx for patient for right leg pain, Flexeril did not necessarily help this time, already got Tylenol earlier today. Of note, arterial studies 11/09/20 showed ABIs wnl. Will order one time dose of oxycodone. PDMP reviewed and no inappropriate fills.

## 2020-11-11 NOTE — Progress Notes (Signed)
VASCULAR LAB    Right lower extremity venous duplex has been performed.  See CV proc for preliminary results.   Jacaden Forbush, RVT 11/11/2020, 11:49 AM

## 2020-11-11 NOTE — Progress Notes (Signed)
Patient instructed and encourage to use Incentive spirometer

## 2020-11-12 ENCOUNTER — Inpatient Hospital Stay (HOSPITAL_COMMUNITY): Payer: Medicare Other

## 2020-11-12 DIAGNOSIS — I2510 Atherosclerotic heart disease of native coronary artery without angina pectoris: Secondary | ICD-10-CM | POA: Diagnosis not present

## 2020-11-12 DIAGNOSIS — I25110 Atherosclerotic heart disease of native coronary artery with unstable angina pectoris: Principal | ICD-10-CM

## 2020-11-12 DIAGNOSIS — E7800 Pure hypercholesterolemia, unspecified: Secondary | ICD-10-CM

## 2020-11-12 DIAGNOSIS — I200 Unstable angina: Secondary | ICD-10-CM | POA: Diagnosis not present

## 2020-11-12 LAB — BLOOD GAS, ARTERIAL
Acid-Base Excess: 4.8 mmol/L — ABNORMAL HIGH (ref 0.0–2.0)
Bicarbonate: 28.7 mmol/L — ABNORMAL HIGH (ref 20.0–28.0)
FIO2: 21
O2 Saturation: 93.9 %
Patient temperature: 37
pCO2 arterial: 41.7 mmHg (ref 32.0–48.0)
pH, Arterial: 7.453 — ABNORMAL HIGH (ref 7.350–7.450)
pO2, Arterial: 66.2 mmHg — ABNORMAL LOW (ref 83.0–108.0)

## 2020-11-12 LAB — GLUCOSE, CAPILLARY
Glucose-Capillary: 123 mg/dL — ABNORMAL HIGH (ref 70–99)
Glucose-Capillary: 161 mg/dL — ABNORMAL HIGH (ref 70–99)
Glucose-Capillary: 132 mg/dL — ABNORMAL HIGH (ref 70–99)
Glucose-Capillary: 136 mg/dL — ABNORMAL HIGH (ref 70–99)

## 2020-11-12 LAB — BASIC METABOLIC PANEL
Anion gap: 7 (ref 5–15)
BUN: 15 mg/dL (ref 8–23)
CO2: 29 mmol/L (ref 22–32)
Calcium: 8.9 mg/dL (ref 8.9–10.3)
Chloride: 101 mmol/L (ref 98–111)
Creatinine, Ser: 0.97 mg/dL (ref 0.61–1.24)
GFR, Estimated: >60 mL/min (ref >60)
Glucose, Bld: 112 mg/dL — ABNORMAL HIGH (ref 70–99)
Potassium: 4.5 mmol/L (ref 3.5–5.1)
Sodium: 137 mmol/L (ref 135–145)

## 2020-11-12 LAB — CBC
HCT: 36.5 % — ABNORMAL LOW (ref 39.0–52.0)
Hemoglobin: 11.6 g/dL — ABNORMAL LOW (ref 13.0–17.0)
MCH: 27.6 pg (ref 26.0–34.0)
MCHC: 31.8 g/dL (ref 30.0–36.0)
MCV: 86.9 fL (ref 80.0–100.0)
Platelets: 165 10*3/uL (ref 150–400)
RBC: 4.2 MIL/uL — ABNORMAL LOW (ref 4.22–5.81)
RDW: 15.3 % (ref 11.5–15.5)
WBC: 6.8 10*3/uL (ref 4.0–10.5)
nRBC: 0 % (ref 0.0–0.2)

## 2020-11-12 LAB — APTT: aPTT: 66 seconds — ABNORMAL HIGH (ref 24–36)

## 2020-11-12 LAB — HEMOGLOBIN A1C
Hgb A1c MFr Bld: 6.2 % — ABNORMAL HIGH (ref 4.8–5.6)
Mean Plasma Glucose: 131.24 mg/dL

## 2020-11-12 LAB — TYPE AND SCREEN
ABO/RH(D): O POS
Antibody Screen: NEGATIVE

## 2020-11-12 LAB — MAGNESIUM: Magnesium: 1.8 mg/dL (ref 1.7–2.4)

## 2020-11-12 LAB — PROTIME-INR
INR: 1 (ref 0.8–1.2)
Prothrombin Time: 13.5 seconds (ref 11.4–15.2)

## 2020-11-12 LAB — HEPARIN LEVEL (UNFRACTIONATED): Heparin Unfractionated: 0.26 IU/mL — ABNORMAL LOW (ref 0.30–0.70)

## 2020-11-12 LAB — ABO/RH: ABO/RH(D): O POS

## 2020-11-12 MED ORDER — MILRINONE LACTATE IN DEXTROSE 20-5 MG/100ML-% IV SOLN
0.3000 ug/kg/min | INTRAVENOUS | Status: DC
  Filled 2020-11-12: qty 100

## 2020-11-12 MED ORDER — NITROGLYCERIN IN D5W 200-5 MCG/ML-% IV SOLN
2.0000 ug/min | INTRAVENOUS | Status: AC
  Administered 2020-11-13: 10 ug/min via INTRAVENOUS
  Filled 2020-11-12: qty 250

## 2020-11-12 MED ORDER — MAGNESIUM SULFATE 2 GM/50ML IV SOLN
2.0000 g | Freq: Once | INTRAVENOUS | Status: AC
  Administered 2020-11-12: 2 g via INTRAVENOUS
  Filled 2020-11-12: qty 50

## 2020-11-12 MED ORDER — NOREPINEPHRINE 4 MG/250ML-% IV SOLN
0.0000 ug/min | INTRAVENOUS | Status: DC
  Filled 2020-11-12: qty 250

## 2020-11-12 MED ORDER — PHENYLEPHRINE HCL-NACL 20-0.9 MG/250ML-% IV SOLN
30.0000 ug/min | INTRAVENOUS | Status: AC
  Administered 2020-11-13: 25 ug/min via INTRAVENOUS
  Filled 2020-11-12: qty 250

## 2020-11-12 MED ORDER — BISACODYL 5 MG PO TBEC: 5.0000 mg | DELAYED_RELEASE_TABLET | Freq: Once | ORAL | Status: DC

## 2020-11-12 MED ORDER — OXYCODONE HCL 5 MG PO TABS
5.0000 mg | ORAL_TABLET | Freq: Four times a day (QID) | ORAL | Status: DC | PRN
  Administered 2020-11-12: 5 mg via ORAL
  Filled 2020-11-12: qty 1

## 2020-11-12 MED ORDER — CHLORHEXIDINE GLUCONATE 0.12 % MT SOLN
15.0000 mL | Freq: Once | OROMUCOSAL | Status: AC
  Administered 2020-11-13: 15 mL via OROMUCOSAL
  Filled 2020-11-12: qty 15

## 2020-11-12 MED ORDER — MANNITOL 20 % IV SOLN
INTRAVENOUS | Status: DC
  Filled 2020-11-12: qty 13

## 2020-11-12 MED ORDER — POTASSIUM CHLORIDE 2 MEQ/ML IV SOLN
80.0000 meq | INTRAVENOUS | Status: DC
  Filled 2020-11-12: qty 40

## 2020-11-12 MED ORDER — EPINEPHRINE HCL 5 MG/250ML IV SOLN IN NS
0.0000 ug/min | INTRAVENOUS | Status: DC
  Filled 2020-11-12: qty 250

## 2020-11-12 MED ORDER — TRANEXAMIC ACID (OHS) BOLUS VIA INFUSION
15.0000 mg/kg | INTRAVENOUS | Status: AC
  Administered 2020-11-13: 1224 mg via INTRAVENOUS
  Filled 2020-11-12: qty 1224

## 2020-11-12 MED ORDER — DEXMEDETOMIDINE HCL IN NACL 400 MCG/100ML IV SOLN
0.1000 ug/kg/h | INTRAVENOUS | Status: AC
  Administered 2020-11-13: .7 ug/kg/h via INTRAVENOUS
  Filled 2020-11-12: qty 100

## 2020-11-12 MED ORDER — CEFAZOLIN SODIUM-DEXTROSE 2-4 GM/100ML-% IV SOLN
2.0000 g | INTRAVENOUS | Status: AC
  Administered 2020-11-13: 2 g via INTRAVENOUS
  Filled 2020-11-12: qty 100

## 2020-11-12 MED ORDER — METOPROLOL TARTRATE 12.5 MG HALF TABLET
12.5000 mg | ORAL_TABLET | Freq: Once | ORAL | Status: AC
  Administered 2020-11-13: 12.5 mg via ORAL
  Filled 2020-11-12: qty 1

## 2020-11-12 MED ORDER — INSULIN REGULAR(HUMAN) IN NACL 100-0.9 UT/100ML-% IV SOLN
INTRAVENOUS | Status: AC
  Administered 2020-11-13: 1.1 [IU]/h via INTRAVENOUS
  Filled 2020-11-12: qty 100

## 2020-11-12 MED ORDER — VANCOMYCIN HCL 1250 MG/250ML IV SOLN
1250.0000 mg | INTRAVENOUS | Status: AC
  Administered 2020-11-13: 1250 mg via INTRAVENOUS
  Filled 2020-11-12: qty 250

## 2020-11-12 MED ORDER — TEMAZEPAM 15 MG PO CAPS
15.0000 mg | ORAL_CAPSULE | Freq: Once | ORAL | Status: DC | PRN
Start: 2020-11-12 — End: 2020-11-13

## 2020-11-12 MED ORDER — TRANEXAMIC ACID 1000 MG/10ML IV SOLN
1.5000 mg/kg/h | INTRAVENOUS | Status: AC
  Administered 2020-11-13: 1.5 mg/kg/h via INTRAVENOUS
  Filled 2020-11-12 (×2): qty 25

## 2020-11-12 MED ORDER — CHLORHEXIDINE GLUCONATE CLOTH 2 % EX PADS
6.0000 | MEDICATED_PAD | Freq: Once | CUTANEOUS | Status: AC
  Administered 2020-11-12: 6 via TOPICAL

## 2020-11-12 MED ORDER — CHLORHEXIDINE GLUCONATE CLOTH 2 % EX PADS
6.0000 | MEDICATED_PAD | Freq: Once | CUTANEOUS | Status: AC
  Administered 2020-11-13: 6 via TOPICAL

## 2020-11-12 MED ORDER — HEPARIN 30,000 UNITS/1000 ML (OHS) CELLSAVER SOLUTION
Status: DC
  Filled 2020-11-12: qty 1000

## 2020-11-12 MED ORDER — PLASMA-LYTE A IV SOLN
INTRAVENOUS | Status: DC
  Filled 2020-11-12: qty 5

## 2020-11-12 MED ORDER — TRANEXAMIC ACID (OHS) PUMP PRIME SOLUTION
2.0000 mg/kg | INTRAVENOUS | Status: DC
  Filled 2020-11-12: qty 1.63

## 2020-11-12 NOTE — Progress Notes (Addendum)
Progress Note  Patient Name: Ricky Abbott Date of Encounter: 11/12/2020  [facility] Cardiologist: Ida Rogue, MD   Subjective   Patient states he is not having any chest pain, SOB, able to ambulate to the bathroom without issue. He states he has been having bad right leg cramps for 2-3 days, it's sharp Charley horse like pain.   Inpatient Medications    Scheduled Meds:  aspirin EC  81 mg Oral Daily   carvedilol  12.5 mg Oral BID WC   Chlorhexidine Gluconate Cloth  6 each Topical Daily   ezetimibe  10 mg Oral Daily   feeding supplement  237 mL Oral BID BM   insulin aspart  0-15 Units Subcutaneous TID WC   insulin aspart  0-5 Units Subcutaneous QHS   multivitamin with minerals  1 tablet Oral Daily   pantoprazole  40 mg Oral Daily   rosuvastatin  40 mg Oral Daily   senna-docusate  1 tablet Oral BID   sertraline  100 mg Oral Daily   tamsulosin  0.4 mg Oral QPC supper   Continuous Infusions:  heparin 1,400 Units/hr (11/12/20 0333)   nitroGLYCERIN 35 mcg/min (11/12/20 0045)   PRN Meds: acetaminophen, cyclobenzaprine, magnesium hydroxide, nitroGLYCERIN, ondansetron (ZOFRAN) IV   Vital Signs    Vitals:   11/11/20 1342 11/11/20 1945 11/12/20 0438 11/12/20 0824  BP: (!) 107/55 (!) 106/53 109/60 111/61  Pulse: 73 69 75 73  Resp: 18 17 18 18   Temp: 97.6 F (36.4 C) 97.6 F (36.4 C) 98.5 F (36.9 C) 97.6 F (36.4 C)  TempSrc: Oral Oral Oral Oral  SpO2: 97% 96% 95% 97%  Weight:      Height:        Intake/Output Summary (Last 24 hours) at 11/12/2020 0956 Last data filed at 11/12/2020 0653 Gross per 24 hour  Intake --  Output 1700 ml  Net -1700 ml   Last 3 Weights 11/06/2020 11/05/2020 08/01/2020  Weight (lbs) 179 lb 12.8 oz 194 lb 0.1 oz 194 lb 6 oz  Weight (kg) 81.557 kg 88 kg 88.168 kg      Telemetry    Sinus rhythm 60s, artifacts  - Personally Reviewed  ECG    N/A this AM - Personally Reviewed  Physical Exam   GEN: No acute distress. Sitting  in chair.  Neck: No JVD Cardiac: RRR, no murmurs, rubs, or gallops.  Respiratory: Clear to auscultation bilaterally. GI: Soft, nontender, non-distended  MS: No BLE edema; No deformity. RLE episodic sharp pain, calf tenderness + no erythema or edema  Neuro:  Nonfocal  Psych: Normal affect   Labs    High Sensitivity Troponin:   Recent Labs  Lab 11/05/20 2152 11/06/20 0003 11/09/20 0207  TROPONINIHS 5 9 26*     Chemistry Recent Labs  Lab 11/05/20 2152 11/07/20 0247 11/10/20 0150 11/11/20 0258 11/12/20 0157  NA 139   < > 138 137 137  K 3.6   < > 3.9 4.2 4.5  CL 108   < > 105 103 101  CO2 24   < > 27 27 29   GLUCOSE 108*   < > 108* 126* 112*  BUN 33*   < > 16 16 15   CREATININE 0.82   < > 0.86 0.79 0.97  CALCIUM 9.3   < > 8.7* 8.9 8.9  MG  --   --   --  1.8  --   PROT 7.2  --   --   --   --  ALBUMIN 4.1  --   --   --   --   AST 26  --   --   --   --   ALT 30  --   --   --   --   ALKPHOS 75  --   --   --   --   BILITOT 1.0  --   --   --   --   GFRNONAA >60   < > >60 >60 >60  ANIONGAP 7   < > 6 7 7    < > = values in this interval not displayed.    Lipids  Recent Labs  Lab 11/06/20 0003  CHOL 132  TRIG 143  HDL 36*  LDLCALC 67  CHOLHDL 3.7    Hematology Recent Labs  Lab 11/10/20 0150 11/11/20 0258 11/12/20 0157  WBC 6.8 6.7 6.8  RBC 4.33 4.21* 4.20*  HGB 12.0* 11.7* 11.6*  HCT 37.4* 36.4* 36.5*  MCV 86.4 86.5 86.9  MCH 27.7 27.8 27.6  MCHC 32.1 32.1 31.8  RDW 15.0 15.1 15.3  PLT 164 150 165   Thyroid No results for input(s): TSH, FREET4 in the last 168 hours.  BNPNo results for input(s): BNP, PROBNP in the last 168 hours.  DDimer No results for input(s): DDIMER in the last 168 hours.   Radiology    DG Chest 2 View  Result Date: 11/12/2020 CLINICAL DATA:  Preoperative evaluation for CABG EXAM: CHEST - 2 VIEW COMPARISON:  10/16/2016 FINDINGS: Normal heart size, mediastinal contours, and pulmonary vascularity. Lungs clear. No pulmonary infiltrate,  pleural effusion, or pneumothorax. Minor endplate spur formation thoracic spine. IMPRESSION: No acute abnormalities. Electronically Signed   By: Lavonia Dana M.D.   On: 11/12/2020 08:05   VAS [place] LOWER EXTREMITY VENOUS (DVT)  Result Date: 11/11/2020  Lower Venous DVT Study Patient Name:  KYCE GING  Date of Exam:   11/11/2020 Medical Rec #: [id number]          Accession #:    [id number] Date of Birth: Jun 28, 1945          Patient Gender: M Patient Age:   75 years Exam Location:  [facility] Procedure:      VAS [place] LOWER EXTREMITY VENOUS (DVT) Referring Phys: Minus Breeding --------------------------------------------------------------------------------  Indications: Pain, and Calf cramping.  Comparison Study: No prior study Performing Technologist: Sharion Dove RVS  Examination Guidelines: A complete evaluation includes B-mode imaging, spectral Doppler, color Doppler, and power Doppler as needed of all accessible portions of each vessel. Bilateral testing is considered an integral part of a complete examination. Limited examinations for reoccurring indications may be performed as noted. The reflux portion of the exam is performed with the patient in reverse Trendelenburg.  +---------+---------------+---------+-----------+----------+--------------+ RIGHT    CompressibilityPhasicitySpontaneityPropertiesThrombus Aging +---------+---------------+---------+-----------+----------+--------------+ CFV      Full           Yes      Yes                                 +---------+---------------+---------+-----------+----------+--------------+ SFJ      Full                                                        +---------+---------------+---------+-----------+----------+--------------+  FV Prox  Full                                                        +---------+---------------+---------+-----------+----------+--------------+ FV Mid   Full                                                         +---------+---------------+---------+-----------+----------+--------------+ FV DistalFull                                                        +---------+---------------+---------+-----------+----------+--------------+ PFV      Full                                                        +---------+---------------+---------+-----------+----------+--------------+ POP      Full           Yes                                          +---------+---------------+---------+-----------+----------+--------------+ PTV      Full                                                        +---------+---------------+---------+-----------+----------+--------------+ PERO     Full                                                        +---------+---------------+---------+-----------+----------+--------------+ Soleal   Full                                                        +---------+---------------+---------+-----------+----------+--------------+ Gastroc  Full                                                        +---------+---------------+---------+-----------+----------+--------------+ GSV      Full                                                        +---------+---------------+---------+-----------+----------+--------------+   +----+---------------+---------+-----------+----------+--------------+  LEFTCompressibilityPhasicitySpontaneityPropertiesThrombus Aging +----+---------------+---------+-----------+----------+--------------+ CFV Full           Yes      Yes                                 +----+---------------+---------+-----------+----------+--------------+     Summary: RIGHT: - No evidence of deep vein thrombosis in the lower extremity. No indirect evidence of obstruction proximal to the inguinal ligament.  LEFT: - No evidence of common femoral vein obstruction.  *See table(s) above for measurements and observations. Electronically  signed by Harold Barban MD on 11/11/2020 at 4:05:25 PM.    Final     Cardiac Studies   Left Cardiac Catheterization 11/06/2020: Conclusions: Severe three-vessel coronary artery disease, as detailed below. Low normal left ventricular systolic function (LVEF 41-93%) with mid and apical inferior hypokinesis.  Mildly elevated left ventricular filling pressure (LVEDP 20-25 mmHg).   Recommendations: Transfer to [facility] for cardiac surgery consultation. Hold clopidogrel pending surgical consultation. Restart IV heparin 2 hours after TR band removal. Aggressive secondary prevention.   Diagnostic Dominance: Co-dominant _______________   Echocardiogram 11/07/2020: Impressions: 1. Distal septal and inferior hypokinesis . Left ventricular ejection  fraction, by estimation, is 45 to 50%. The left ventricle has mildly  decreased function. The left ventricle demonstrates regional wall motion  abnormalities (see scoring  diagram/findings for description). There is mild left ventricular  hypertrophy. Left ventricular diastolic parameters were normal.   2. Right ventricular systolic function is normal. The right ventricular  size is normal.   3. The mitral valve is abnormal. Trivial mitral valve regurgitation. No  evidence of mitral stenosis.   4. The aortic valve is tricuspid. Aortic valve regurgitation is not  visualized. Mild to moderate aortic valve sclerosis/calcification is  present, without any evidence of aortic stenosis.   5. The inferior vena cava is normal in size with greater than 50%  respiratory variability, suggesting right atrial pressure of 3 mmHg.     Patient Profile     75 y.o. male with a history of CAD s/p remote PCI to RCA and then MI in 2014 s/p PCI x2 to LAD, sarcoidosis of the lung in remission, COPD, hypertension, hyperlipidemia, hepatitis, PTSD from 3 tours in [place], depression, prior tobacco use (quit in 1984), type 2 DM, and alcohol use who was admitted at [facility] on  11/06/2020 with unstable angina. Cardiac catheterization showed severe 3-vessel CAD. Patient was transferred to [facility] for [facility] consultation.    Assessment & Plan    Unstable Angina 3 vessel CAD s/p Multiple PCI - HS trop 5 >9>26 - severe episode of chest pain 9/29 night with acute ST depression on 9/30 EKG, no recurrence of CP so far  - LHC on 9/27 showed severe 3 vessel disease as noted above.  - Echo showed LVEF of 45-50% with distal septal and inferior hypokinesis and mild LVH. No significant valvular disease noted. - Doppler from 11/11/20 negative for DVTs.  - Continue IV Heparin and IV Nitro  - Medical therapy: Plavix stopped on 9/27. Continue Aspirin. Continue Coreg 12.5mg  twice daily, Crestor 40mg  daily, and Zetia 10mg  daily. - Patient has been seen by CT Surgery. Plan is for CABG on 11/13/2020 after Plavix washout.  Right leg cramp - doppler negative for DVT  - will check Mag level today     Hypertension - BP controlled, low normal  - Continue IV Nitro - Continue Coreg 12.5mg  twice daily. -  home med Imdur held   Hyperlipidemia - LDL 67 from 11/06/20  - Continue Crestor 40mg  daily and Zetia 10mg  daily.    Type 2 Diabetes Mellitus - Hemoglobin A1c 6.2 on 11/12/20 , historically diet controlled at home  - Continue short acting sliding scale insulin AC while in house , goal 140-180s    COPD Pulmonary Sarcoidosis - no acute exacerbation, not on meds at home  - Pre-CABG PFTs completed on 11/07/2020, moderate obstructive disease.  - will order PRN albuterol today    PTSD - Continue home Zoloft   GERD - Continue home Protonix  BPH  - continue Flomax      For questions or updates, please contact [facility] Please consult [email or web address] for contact info under        Signed, Margie Billet, NP  11/12/2020, 9:56 AM

## 2020-11-12 NOTE — Progress Notes (Signed)
Pt.noted small blood spot on his shorts.Pharmcist made aware  & Dr.Lightfoot made aware.Will continue to observe pt.

## 2020-11-12 NOTE — Care Management Important Message (Signed)
Important Message  Patient Details  Name: TITAN KARNER MRN: [id number] Date of Birth: 05-14-1945   Medicare Important Message Given:  Yes     Shelda Altes 11/12/2020, 11:40 AM

## 2020-11-12 NOTE — Progress Notes (Signed)
ANTICOAGULATION CONSULT NOTE - Follow Up Consult  Pharmacy Consult for IV Heparin Indication: chest pain/ACS  Allergies  Allergen Reactions   Peanut-Containing Drug Products Swelling    Swelling, rash, (Peanuts)    Patient Measurements: Height: 5\' 11"  (180.3 cm) Weight: 81.6 kg (179 lb 12.8 oz) IBW/kg (Calculated) : 75.3 Heparin Dosing Weight: 82 kg  Vital Signs: Temp: 97.6 F (36.4 C) (10/02 1945) Temp Source: Oral (10/02 1945) BP: 106/53 (10/02 1945) Pulse Rate: 69 (10/02 1945)  Labs: Recent Labs    11/10/20 0150 11/11/20 0258 11/12/20 0157  HGB 12.0* 11.7* 11.6*  HCT 37.4* 36.4* 36.5*  PLT 164 150 165  APTT  --   --  66*  LABPROT  --   --  13.5  INR  --   --  1.0  HEPARINUNFRC 0.34 0.31 0.26*  CREATININE 0.86 0.79  --     Estimated Creatinine Clearance: 85 mL/min (by C-G formula based on SCr of 0.79 mg/dL).   Assessment: 75 yr old man with PMH relevant for CAD, COPD, DM, GERD, HTN, HLD, and sarcoidosis presented to [facility] ER with acute chest pain. Pharmacy was consulted to manage heparin infusion for ACS/chest pain. Pt is S/P cardiac cath, which showed severe 3-vessel disease. Plan is for CABG on 11/13/2020.   Heparin level down to subtherapeutic (0.26) on gtt at 1250 units/hr. No issues with line or bleeding reported per RN.  Goal of Therapy:  Heparin level 0.3-0.7 units/ml Monitor platelets by anticoagulation protocol: Yes  Plan:  Increase IV heparin gtt to 1400 units/h Will f/u 8hr heparin level  Sherlon Handing, PharmD, BCPS Please see amion for complete clinical pharmacist phone list 11/12/2020 3:25 AM

## 2020-11-13 ENCOUNTER — Inpatient Hospital Stay (HOSPITAL_COMMUNITY)
Admission: AD | Disposition: A | Discharge status: Home Health | Payer: Self-pay | Attending: Thoracic Surgery (Cardiothoracic Vascular Surgery)

## 2020-11-13 ENCOUNTER — Inpatient Hospital Stay (HOSPITAL_COMMUNITY): Payer: Medicare Other | Admitting: Anesthesiology

## 2020-11-13 ENCOUNTER — Inpatient Hospital Stay (HOSPITAL_COMMUNITY): Payer: Medicare Other

## 2020-11-13 ENCOUNTER — Encounter (HOSPITAL_COMMUNITY): Payer: Self-pay | Admitting: Cardiology

## 2020-11-13 DIAGNOSIS — I2510 Atherosclerotic heart disease of native coronary artery without angina pectoris: Secondary | ICD-10-CM | POA: Diagnosis present

## 2020-11-13 DIAGNOSIS — I25110 Atherosclerotic heart disease of native coronary artery with unstable angina pectoris: Secondary | ICD-10-CM

## 2020-11-13 DIAGNOSIS — I214 Non-ST elevation (NSTEMI) myocardial infarction: Secondary | ICD-10-CM

## 2020-11-13 DIAGNOSIS — Z951 Presence of aortocoronary bypass graft: Secondary | ICD-10-CM

## 2020-11-13 HISTORY — PX: TEE WITHOUT CARDIOVERSION: SHX5443

## 2020-11-13 HISTORY — PX: CORONARY ARTERY BYPASS GRAFT: SHX141

## 2020-11-13 HISTORY — PX: ENDOVEIN HARVEST OF GREATER SAPHENOUS VEIN: SHX5059

## 2020-11-13 HISTORY — DX: Presence of aortocoronary bypass graft: Z95.1

## 2020-11-13 LAB — POCT I-STAT 7, (LYTES, BLD GAS, ICA,H+H)
Acid-Base Excess: 2 mmol/L (ref 0.0–2.0)
Acid-Base Excess: 4 mmol/L — ABNORMAL HIGH (ref 0.0–2.0)
Acid-Base Excess: 4 mmol/L — ABNORMAL HIGH (ref 0.0–2.0)
Acid-Base Excess: 0 mmol/L (ref 0.0–2.0)
Bicarbonate: 26.3 mmol/L (ref 20.0–28.0)
Bicarbonate: 28.6 mmol/L — ABNORMAL HIGH (ref 20.0–28.0)
Bicarbonate: 28 mmol/L (ref 20.0–28.0)
Bicarbonate: 25.1 mmol/L (ref 20.0–28.0)
Calcium, Ion: 1.15 mmol/L (ref 1.15–1.40)
Calcium, Ion: 1.04 mmol/L — ABNORMAL LOW (ref 1.15–1.40)
Calcium, Ion: 1.08 mmol/L — ABNORMAL LOW (ref 1.15–1.40)
Calcium, Ion: 1.36 mmol/L (ref 1.15–1.40)
HCT: 35 % — ABNORMAL LOW (ref 39.0–52.0)
HCT: 28 % — ABNORMAL LOW (ref 39.0–52.0)
HCT: 26 % — ABNORMAL LOW (ref 39.0–52.0)
HCT: 26 % — ABNORMAL LOW (ref 39.0–52.0)
Hemoglobin: 11.9 g/dL — ABNORMAL LOW (ref 13.0–17.0)
Hemoglobin: 9.5 g/dL — ABNORMAL LOW (ref 13.0–17.0)
Hemoglobin: 8.8 g/dL — ABNORMAL LOW (ref 13.0–17.0)
Hemoglobin: 8.8 g/dL — ABNORMAL LOW (ref 13.0–17.0)
O2 Saturation: 100 %
O2 Saturation: 100 %
O2 Saturation: 100 %
O2 Saturation: 97 %
Potassium: 4 mmol/L (ref 3.5–5.1)
Potassium: 4.7 mmol/L (ref 3.5–5.1)
Potassium: 5.6 mmol/L — ABNORMAL HIGH (ref 3.5–5.1)
Potassium: 4.8 mmol/L (ref 3.5–5.1)
Sodium: 139 mmol/L (ref 135–145)
Sodium: 136 mmol/L (ref 135–145)
Sodium: 136 mmol/L (ref 135–145)
Sodium: 138 mmol/L (ref 135–145)
TCO2: 28 mmol/L (ref 22–32)
TCO2: 30 mmol/L (ref 22–32)
TCO2: 29 mmol/L (ref 22–32)
TCO2: 26 mmol/L (ref 22–32)
pCO2 arterial: 40.6 mmHg (ref 32.0–48.0)
pCO2 arterial: 42.3 mmHg (ref 32.0–48.0)
pCO2 arterial: 40.3 mmHg (ref 32.0–48.0)
pCO2 arterial: 39.4 mmHg (ref 32.0–48.0)
pH, Arterial: 7.419 (ref 7.350–7.450)
pH, Arterial: 7.438 (ref 7.350–7.450)
pH, Arterial: 7.449 (ref 7.350–7.450)
pH, Arterial: 7.411 (ref 7.350–7.450)
pO2, Arterial: 237 mmHg — ABNORMAL HIGH (ref 83.0–108.0)
pO2, Arterial: 303 mmHg — ABNORMAL HIGH (ref 83.0–108.0)
pO2, Arterial: 275 mmHg — ABNORMAL HIGH (ref 83.0–108.0)
pO2, Arterial: 93 mmHg (ref 83.0–108.0)

## 2020-11-13 LAB — POCT I-STAT EG7
Acid-Base Excess: 4 mmol/L — ABNORMAL HIGH (ref 0.0–2.0)
Bicarbonate: 28.5 mmol/L — ABNORMAL HIGH (ref 20.0–28.0)
Calcium, Ion: 1.05 mmol/L — ABNORMAL LOW (ref 1.15–1.40)
HCT: 27 % — ABNORMAL LOW (ref 39.0–52.0)
Hemoglobin: 9.2 g/dL — ABNORMAL LOW (ref 13.0–17.0)
O2 Saturation: 82 %
Potassium: 4.5 mmol/L (ref 3.5–5.1)
Sodium: 136 mmol/L (ref 135–145)
TCO2: 30 mmol/L (ref 22–32)
pCO2, Ven: 44.1 mmHg (ref 44.0–60.0)
pH, Ven: 7.418 (ref 7.250–7.430)
pO2, Ven: 45 mmHg (ref 32.0–45.0)

## 2020-11-13 LAB — POCT I-STAT, CHEM 8
BUN: 19 mg/dL (ref 8–23)
BUN: 18 mg/dL (ref 8–23)
BUN: 18 mg/dL (ref 8–23)
BUN: 19 mg/dL (ref 8–23)
BUN: 17 mg/dL (ref 8–23)
Calcium, Ion: 1.26 mmol/L (ref 1.15–1.40)
Calcium, Ion: 1.23 mmol/L (ref 1.15–1.40)
Calcium, Ion: 1.06 mmol/L — ABNORMAL LOW (ref 1.15–1.40)
Calcium, Ion: 1.1 mmol/L — ABNORMAL LOW (ref 1.15–1.40)
Calcium, Ion: 1.39 mmol/L (ref 1.15–1.40)
Chloride: 100 mmol/L (ref 98–111)
Chloride: 100 mmol/L (ref 98–111)
Chloride: 100 mmol/L (ref 98–111)
Chloride: 102 mmol/L (ref 98–111)
Chloride: 104 mmol/L (ref 98–111)
Creatinine, Ser: 0.8 mg/dL (ref 0.61–1.24)
Creatinine, Ser: 0.8 mg/dL (ref 0.61–1.24)
Creatinine, Ser: 0.7 mg/dL (ref 0.61–1.24)
Creatinine, Ser: 0.8 mg/dL (ref 0.61–1.24)
Creatinine, Ser: 0.8 mg/dL (ref 0.61–1.24)
Glucose, Bld: 120 mg/dL — ABNORMAL HIGH (ref 70–99)
Glucose, Bld: 131 mg/dL — ABNORMAL HIGH (ref 70–99)
Glucose, Bld: 112 mg/dL — ABNORMAL HIGH (ref 70–99)
Glucose, Bld: 143 mg/dL — ABNORMAL HIGH (ref 70–99)
Glucose, Bld: 135 mg/dL — ABNORMAL HIGH (ref 70–99)
HCT: 37 % — ABNORMAL LOW (ref 39.0–52.0)
HCT: 34 % — ABNORMAL LOW (ref 39.0–52.0)
HCT: 26 % — ABNORMAL LOW (ref 39.0–52.0)
HCT: 26 % — ABNORMAL LOW (ref 39.0–52.0)
HCT: 26 % — ABNORMAL LOW (ref 39.0–52.0)
Hemoglobin: 12.6 g/dL — ABNORMAL LOW (ref 13.0–17.0)
Hemoglobin: 11.6 g/dL — ABNORMAL LOW (ref 13.0–17.0)
Hemoglobin: 8.8 g/dL — ABNORMAL LOW (ref 13.0–17.0)
Hemoglobin: 8.8 g/dL — ABNORMAL LOW (ref 13.0–17.0)
Hemoglobin: 8.8 g/dL — ABNORMAL LOW (ref 13.0–17.0)
Potassium: 4.5 mmol/L (ref 3.5–5.1)
Potassium: 4.6 mmol/L (ref 3.5–5.1)
Potassium: 5.5 mmol/L — ABNORMAL HIGH (ref 3.5–5.1)
Potassium: 5.7 mmol/L — ABNORMAL HIGH (ref 3.5–5.1)
Potassium: 4.8 mmol/L (ref 3.5–5.1)
Sodium: 136 mmol/L (ref 135–145)
Sodium: 136 mmol/L (ref 135–145)
Sodium: 135 mmol/L (ref 135–145)
Sodium: 135 mmol/L (ref 135–145)
Sodium: 137 mmol/L (ref 135–145)
TCO2: 31 mmol/L (ref 22–32)
TCO2: 26 mmol/L (ref 22–32)
TCO2: 27 mmol/L (ref 22–32)
TCO2: 28 mmol/L (ref 22–32)
TCO2: 24 mmol/L (ref 22–32)

## 2020-11-13 LAB — PROTIME-INR
INR: 1.3 — ABNORMAL HIGH (ref 0.8–1.2)
Prothrombin Time: 16.6 seconds — ABNORMAL HIGH (ref 11.4–15.2)

## 2020-11-13 LAB — ECHO INTRAOPERATIVE TEE
AR max vel: 2.06 cm2
AV Area VTI: 2.12 cm2
AV Area mean vel: 1.61 cm2
AV Mean grad: 3 mmHg
AV Peak grad: 6.3 mmHg
Ao pk vel: 1.25 m/s
Area-P 1/2: 3.77 cm2
Height: 71 in
S' Lateral: 3.89 cm
Weight: 2876.8 oz

## 2020-11-13 LAB — BASIC METABOLIC PANEL
Anion gap: 9 (ref 5–15)
BUN: 20 mg/dL (ref 8–23)
CO2: 28 mmol/L (ref 22–32)
Calcium: 9.3 mg/dL (ref 8.9–10.3)
Chloride: 100 mmol/L (ref 98–111)
Creatinine, Ser: 0.9 mg/dL (ref 0.61–1.24)
GFR, Estimated: >60 mL/min (ref >60)
Glucose, Bld: 123 mg/dL — ABNORMAL HIGH (ref 70–99)
Potassium: 4.6 mmol/L (ref 3.5–5.1)
Sodium: 137 mmol/L (ref 135–145)

## 2020-11-13 LAB — APTT: aPTT: 26 seconds (ref 24–36)

## 2020-11-13 LAB — CBC
HCT: 39.3 % (ref 39.0–52.0)
HCT: 33.9 % — ABNORMAL LOW (ref 39.0–52.0)
Hemoglobin: 12.5 g/dL — ABNORMAL LOW (ref 13.0–17.0)
Hemoglobin: 10.8 g/dL — ABNORMAL LOW (ref 13.0–17.0)
MCH: 27.7 pg (ref 26.0–34.0)
MCH: 28 pg (ref 26.0–34.0)
MCHC: 31.8 g/dL (ref 30.0–36.0)
MCHC: 31.9 g/dL (ref 30.0–36.0)
MCV: 86.9 fL (ref 80.0–100.0)
MCV: 87.8 fL (ref 80.0–100.0)
Platelets: 182 10*3/uL (ref 150–400)
Platelets: 122 10*3/uL — ABNORMAL LOW (ref 150–400)
RBC: 4.52 MIL/uL (ref 4.22–5.81)
RBC: 3.86 MIL/uL — ABNORMAL LOW (ref 4.22–5.81)
RDW: 15.3 % (ref 11.5–15.5)
RDW: 15.4 % (ref 11.5–15.5)
WBC: 8.3 10*3/uL (ref 4.0–10.5)
WBC: 11.4 10*3/uL — ABNORMAL HIGH (ref 4.0–10.5)
nRBC: 0 % (ref 0.0–0.2)
nRBC: 0 % (ref 0.0–0.2)

## 2020-11-13 LAB — HEPARIN LEVEL (UNFRACTIONATED): Heparin Unfractionated: 0.38 IU/mL (ref 0.30–0.70)

## 2020-11-13 LAB — GLUCOSE, CAPILLARY
Glucose-Capillary: 139 mg/dL — ABNORMAL HIGH (ref 70–99)
Glucose-Capillary: 116 mg/dL — ABNORMAL HIGH (ref 70–99)
Glucose-Capillary: 137 mg/dL — ABNORMAL HIGH (ref 70–99)
Glucose-Capillary: 142 mg/dL — ABNORMAL HIGH (ref 70–99)
Glucose-Capillary: 131 mg/dL — ABNORMAL HIGH (ref 70–99)
Glucose-Capillary: 116 mg/dL — ABNORMAL HIGH (ref 70–99)
Glucose-Capillary: 119 mg/dL — ABNORMAL HIGH (ref 70–99)

## 2020-11-13 LAB — HEMOGLOBIN AND HEMATOCRIT, BLOOD
HCT: 27.2 % — ABNORMAL LOW (ref 39.0–52.0)
Hemoglobin: 8.8 g/dL — ABNORMAL LOW (ref 13.0–17.0)

## 2020-11-13 LAB — PLATELET COUNT: Platelets: 149 10*3/uL — ABNORMAL LOW (ref 150–400)

## 2020-11-13 SURGERY — CORONARY ARTERY BYPASS GRAFTING (CABG)
Anesthesia: General | Site: Chest | Laterality: Right

## 2020-11-13 MED ORDER — MAGNESIUM SULFATE 4 GM/100ML IV SOLN
4.0000 g | Freq: Once | INTRAVENOUS | Status: AC
  Administered 2020-11-13: 4 g via INTRAVENOUS
  Filled 2020-11-13: qty 100

## 2020-11-13 MED ORDER — ACETAMINOPHEN 160 MG/5ML PO SOLN
1000.0000 mg | Freq: Four times a day (QID) | ORAL | Status: AC
  Administered 2020-11-13 – 2020-11-14 (×2): 1000 mg
  Filled 2020-11-13 (×2): qty 40.6

## 2020-11-13 MED ORDER — DOBUTAMINE INFUSION FOR EP/ECHO/NUC (1000 MCG/ML)
0.0000 ug/kg/min | INTRAVENOUS | Status: DC
Start: 2020-11-13 — End: 2020-11-13
  Filled 2020-11-13: qty 250

## 2020-11-13 MED ORDER — ALBUMIN HUMAN 5 % IV SOLN
250.0000 mL | INTRAVENOUS | Status: AC | PRN
Start: 2020-11-13 — End: 2020-11-14

## 2020-11-13 MED ORDER — LEVALBUTEROL HCL 0.63 MG/3ML IN NEBU: 0.6300 mg | INHALATION_SOLUTION | Freq: Three times a day (TID) | RESPIRATORY_TRACT | Status: DC | PRN

## 2020-11-13 MED ORDER — ACETAMINOPHEN 160 MG/5ML PO SOLN: 650.0000 mg | Freq: Once | ORAL | Status: AC

## 2020-11-13 MED ORDER — POTASSIUM CHLORIDE 10 MEQ/50ML IV SOLN
10.0000 meq | INTRAVENOUS | Status: AC
Start: 2020-11-13 — End: 2020-11-13

## 2020-11-13 MED ORDER — NICARDIPINE HCL IN NACL 20-0.86 MG/200ML-% IV SOLN: 0.0000 mg/h | INTRAVENOUS | Status: DC

## 2020-11-13 MED ORDER — PHENYLEPHRINE 40 MCG/ML (10ML) SYRINGE FOR IV PUSH (FOR BLOOD PRESSURE SUPPORT)
PREFILLED_SYRINGE | INTRAVENOUS | Status: AC
  Filled 2020-11-13: qty 10

## 2020-11-13 MED ORDER — METOPROLOL TARTRATE 25 MG/10 ML ORAL SUSPENSION
12.5000 mg | Freq: Two times a day (BID) | ORAL | Status: DC
  Filled 2020-11-13: qty 5

## 2020-11-13 MED ORDER — TRAMADOL HCL 50 MG PO TABS
50.0000 mg | ORAL_TABLET | ORAL | Status: DC | PRN
  Administered 2020-11-14: 50 mg via ORAL
  Filled 2020-11-13: qty 1

## 2020-11-13 MED ORDER — MIDAZOLAM HCL (PF) 5 MG/ML IJ SOLN
INTRAMUSCULAR | Status: DC | PRN
  Administered 2020-11-13 (×3): 1 mg via INTRAVENOUS
  Administered 2020-11-13: 2 mg via INTRAVENOUS
  Administered 2020-11-13: 3 mg via INTRAVENOUS
  Administered 2020-11-13: 2 mg via INTRAVENOUS

## 2020-11-13 MED ORDER — CHLORHEXIDINE GLUCONATE 0.12 % MT SOLN
15.0000 mL | OROMUCOSAL | Status: AC
  Administered 2020-11-13: 15 mL via OROMUCOSAL

## 2020-11-13 MED ORDER — FENTANYL CITRATE (PF) 250 MCG/5ML IJ SOLN
INTRAMUSCULAR | Status: AC
  Filled 2020-11-13: qty 25

## 2020-11-13 MED ORDER — SODIUM CHLORIDE 0.9 % IV SOLN
250.0000 mL | INTRAVENOUS | Status: DC
  Administered 2020-11-14: 250 mL via INTRAVENOUS

## 2020-11-13 MED ORDER — PHENYLEPHRINE HCL-NACL 20-0.9 MG/250ML-% IV SOLN
0.0000 ug/min | INTRAVENOUS | Status: DC
Start: 2020-11-13 — End: 2020-11-15
  Administered 2020-11-14: 25 ug/min via INTRAVENOUS
  Administered 2020-11-14: 50 ug/min via INTRAVENOUS
  Administered 2020-11-14: 45 ug/min via INTRAVENOUS
  Filled 2020-11-13 (×3): qty 250

## 2020-11-13 MED ORDER — SODIUM CHLORIDE 0.9 % IV SOLN: INTRAVENOUS | Status: DC

## 2020-11-13 MED ORDER — DEXMEDETOMIDINE HCL IN NACL 400 MCG/100ML IV SOLN: 0.0000 ug/kg/h | INTRAVENOUS | Status: DC

## 2020-11-13 MED ORDER — ROCURONIUM BROMIDE 10 MG/ML (PF) SYRINGE
PREFILLED_SYRINGE | INTRAVENOUS | Status: DC | PRN
  Administered 2020-11-13: 50 mg via INTRAVENOUS
  Administered 2020-11-13: 30 mg via INTRAVENOUS
  Administered 2020-11-13: 70 mg via INTRAVENOUS

## 2020-11-13 MED ORDER — ACETAMINOPHEN 650 MG RE SUPP
650.0000 mg | Freq: Once | RECTAL | Status: AC
  Administered 2020-11-13: 650 mg via RECTAL

## 2020-11-13 MED ORDER — CEFAZOLIN SODIUM-DEXTROSE 2-4 GM/100ML-% IV SOLN
2.0000 g | Freq: Three times a day (TID) | INTRAVENOUS | Status: DC
Start: 2020-11-13 — End: 2020-11-15
  Administered 2020-11-13 – 2020-11-15 (×5): 2 g via INTRAVENOUS
  Filled 2020-11-13 (×6): qty 100

## 2020-11-13 MED ORDER — BISACODYL 5 MG PO TBEC
10.0000 mg | DELAYED_RELEASE_TABLET | Freq: Every day | ORAL | Status: DC
  Administered 2020-11-14 – 2020-11-20 (×7): 10 mg via ORAL
  Filled 2020-11-13 (×7): qty 2

## 2020-11-13 MED ORDER — ORAL CARE MOUTH RINSE
15.0000 mL | OROMUCOSAL | Status: DC
  Administered 2020-11-13 – 2020-11-14 (×5): 15 mL via OROMUCOSAL

## 2020-11-13 MED ORDER — PROTAMINE SULFATE 10 MG/ML IV SOLN
INTRAVENOUS | Status: DC | PRN
  Administered 2020-11-13: 280 mg via INTRAVENOUS

## 2020-11-13 MED ORDER — SODIUM CHLORIDE (PF) 0.9 % IJ SOLN
OROMUCOSAL | Status: DC | PRN
  Administered 2020-11-13: 8 mL

## 2020-11-13 MED ORDER — DEXTROSE 50 % IV SOLN: 0.0000 mL | INTRAVENOUS | Status: DC | PRN

## 2020-11-13 MED ORDER — ONDANSETRON HCL 4 MG/2ML IJ SOLN
4.0000 mg | Freq: Four times a day (QID) | INTRAMUSCULAR | Status: DC | PRN
  Administered 2020-11-15: 4 mg via INTRAVENOUS
  Filled 2020-11-13: qty 2

## 2020-11-13 MED ORDER — LIDOCAINE 2% (20 MG/ML) 5 ML SYRINGE
INTRAMUSCULAR | Status: DC | PRN
  Administered 2020-11-13: 80 mg via INTRAVENOUS

## 2020-11-13 MED ORDER — CHLORHEXIDINE GLUCONATE CLOTH 2 % EX PADS
6.0000 | MEDICATED_PAD | Freq: Every day | CUTANEOUS | Status: DC
  Administered 2020-11-13 – 2020-11-14 (×2): 6 via TOPICAL

## 2020-11-13 MED ORDER — ROCURONIUM BROMIDE 10 MG/ML (PF) SYRINGE
PREFILLED_SYRINGE | INTRAVENOUS | Status: AC
  Filled 2020-11-13: qty 10

## 2020-11-13 MED ORDER — ORAL CARE MOUTH RINSE: 15.0000 mL | Freq: Once | OROMUCOSAL | Status: DC

## 2020-11-13 MED ORDER — LIDOCAINE 2% (20 MG/ML) 5 ML SYRINGE
INTRAMUSCULAR | Status: AC
  Filled 2020-11-13: qty 5

## 2020-11-13 MED ORDER — POLYETHYLENE GLYCOL 3350 17 G PO PACK
17.0000 g | PACK | Freq: Every day | ORAL | Status: DC
  Administered 2020-11-14 – 2020-11-20 (×7): 17 g via ORAL
  Filled 2020-11-13 (×7): qty 1

## 2020-11-13 MED ORDER — ACETAMINOPHEN 500 MG PO TABS
1000.0000 mg | ORAL_TABLET | Freq: Four times a day (QID) | ORAL | Status: AC
  Administered 2020-11-14 – 2020-11-18 (×17): 1000 mg via ORAL
  Filled 2020-11-13 (×17): qty 2

## 2020-11-13 MED ORDER — BISACODYL 10 MG RE SUPP: 10.0000 mg | Freq: Every day | RECTAL | Status: DC

## 2020-11-13 MED ORDER — MIDAZOLAM HCL 2 MG/2ML IJ SOLN: 2.0000 mg | INTRAMUSCULAR | Status: DC | PRN

## 2020-11-13 MED ORDER — SODIUM CHLORIDE 0.45 % IV SOLN: INTRAVENOUS | Status: DC | PRN

## 2020-11-13 MED ORDER — LACTATED RINGERS IV SOLN
500.0000 mL | Freq: Once | INTRAVENOUS | Status: DC | PRN
Start: 2020-11-13 — End: 2020-11-15

## 2020-11-13 MED ORDER — SODIUM CHLORIDE 0.9 % IV SOLN: INTRAVENOUS | Status: DC | PRN

## 2020-11-13 MED ORDER — CHLORHEXIDINE GLUCONATE 0.12% ORAL RINSE (MEDLINE KIT)
15.0000 mL | Freq: Two times a day (BID) | OROMUCOSAL | Status: DC
  Administered 2020-11-13 – 2020-11-14 (×2): 15 mL via OROMUCOSAL

## 2020-11-13 MED ORDER — OXYCODONE HCL 5 MG PO TABS
5.0000 mg | ORAL_TABLET | ORAL | Status: DC | PRN
  Administered 2020-11-14: 5 mg via ORAL
  Filled 2020-11-13: qty 1

## 2020-11-13 MED ORDER — PROTAMINE SULFATE 10 MG/ML IV SOLN
INTRAVENOUS | Status: AC
  Filled 2020-11-13: qty 5

## 2020-11-13 MED ORDER — PANTOPRAZOLE SODIUM 40 MG PO TBEC
40.0000 mg | DELAYED_RELEASE_TABLET | Freq: Every day | ORAL | Status: DC
  Administered 2020-11-15 – 2020-11-20 (×6): 40 mg via ORAL
  Filled 2020-11-13 (×6): qty 1

## 2020-11-13 MED ORDER — LACTATED RINGERS IV SOLN: INTRAVENOUS | Status: DC

## 2020-11-13 MED ORDER — ASPIRIN 81 MG PO CHEW
324.0000 mg | CHEWABLE_TABLET | Freq: Every day | ORAL | Status: DC
  Administered 2020-11-14: 324 mg
  Filled 2020-11-13: qty 4

## 2020-11-13 MED ORDER — MIDAZOLAM HCL (PF) 10 MG/2ML IJ SOLN
INTRAMUSCULAR | Status: AC
  Filled 2020-11-13: qty 2

## 2020-11-13 MED ORDER — ALBUMIN HUMAN 25 % IV SOLN
12.5000 g | INTRAVENOUS | Status: DC | PRN
  Administered 2020-11-13 – 2020-11-14 (×3): 12.5 g via INTRAVENOUS
  Filled 2020-11-13 (×2): qty 50

## 2020-11-13 MED ORDER — METOPROLOL TARTRATE 12.5 MG HALF TABLET
12.5000 mg | ORAL_TABLET | Freq: Two times a day (BID) | ORAL | Status: DC
  Administered 2020-11-15 – 2020-11-20 (×10): 12.5 mg via ORAL
  Filled 2020-11-13 (×10): qty 1

## 2020-11-13 MED ORDER — VANCOMYCIN HCL IN DEXTROSE 1-5 GM/200ML-% IV SOLN
1000.0000 mg | Freq: Once | INTRAVENOUS | Status: AC
  Administered 2020-11-13: 1000 mg via INTRAVENOUS
  Filled 2020-11-13: qty 200

## 2020-11-13 MED ORDER — NITROGLYCERIN IN D5W 200-5 MCG/ML-% IV SOLN: 0.0000 ug/min | INTRAVENOUS | Status: DC

## 2020-11-13 MED ORDER — 0.9 % SODIUM CHLORIDE (POUR BTL) OPTIME
TOPICAL | Status: DC | PRN
  Administered 2020-11-13: 5000 mL

## 2020-11-13 MED ORDER — CARVEDILOL 6.25 MG PO TABS: 6.2500 mg | ORAL_TABLET | Freq: Two times a day (BID) | ORAL | Status: DC

## 2020-11-13 MED ORDER — INSULIN REGULAR(HUMAN) IN NACL 100-0.9 UT/100ML-% IV SOLN: INTRAVENOUS | Status: DC

## 2020-11-13 MED ORDER — DOCUSATE SODIUM 100 MG PO CAPS
200.0000 mg | ORAL_CAPSULE | Freq: Every day | ORAL | Status: DC
  Administered 2020-11-14 – 2020-11-15 (×2): 200 mg via ORAL
  Filled 2020-11-13 (×2): qty 2

## 2020-11-13 MED ORDER — FAMOTIDINE IN NACL 20-0.9 MG/50ML-% IV SOLN
20.0000 mg | Freq: Two times a day (BID) | INTRAVENOUS | Status: AC
  Administered 2020-11-13 – 2020-11-14 (×2): 20 mg via INTRAVENOUS
  Filled 2020-11-13 (×2): qty 50

## 2020-11-13 MED ORDER — LACTATED RINGERS IV SOLN: INTRAVENOUS | Status: DC | PRN

## 2020-11-13 MED ORDER — ASPIRIN EC 325 MG PO TBEC: 325.0000 mg | DELAYED_RELEASE_TABLET | Freq: Every day | ORAL | Status: DC

## 2020-11-13 MED ORDER — FENTANYL CITRATE (PF) 250 MCG/5ML IJ SOLN
INTRAMUSCULAR | Status: DC | PRN
  Administered 2020-11-13: 100 ug via INTRAVENOUS
  Administered 2020-11-13: 150 ug via INTRAVENOUS
  Administered 2020-11-13 (×6): 100 ug via INTRAVENOUS
  Administered 2020-11-13: 50 ug via INTRAVENOUS
  Administered 2020-11-13: 150 ug via INTRAVENOUS
  Administered 2020-11-13 (×2): 100 ug via INTRAVENOUS

## 2020-11-13 MED ORDER — SODIUM CHLORIDE 0.9 % IV BOLUS
250.0000 mL | Freq: Once | INTRAVENOUS | Status: AC
  Administered 2020-11-13: 250 mL via INTRAVENOUS

## 2020-11-13 MED ORDER — SODIUM CHLORIDE 0.9% FLUSH
3.0000 mL | Freq: Two times a day (BID) | INTRAVENOUS | Status: DC
  Administered 2020-11-14 – 2020-11-15 (×3): 3 mL via INTRAVENOUS

## 2020-11-13 MED ORDER — PHENYLEPHRINE HCL (PRESSORS) 10 MG/ML IV SOLN
INTRAVENOUS | Status: AC
  Filled 2020-11-13: qty 2

## 2020-11-13 MED ORDER — HEPARIN SODIUM (PORCINE) 1000 UNIT/ML IJ SOLN
INTRAMUSCULAR | Status: DC | PRN
  Administered 2020-11-13: 28000 [IU] via INTRAVENOUS

## 2020-11-13 MED ORDER — PROTAMINE SULFATE 10 MG/ML IV SOLN
INTRAVENOUS | Status: AC
  Filled 2020-11-13: qty 25

## 2020-11-13 MED ORDER — SODIUM CHLORIDE 0.9% FLUSH
3.0000 mL | INTRAVENOUS | Status: DC | PRN
Start: 2020-11-14 — End: 2020-11-15

## 2020-11-13 MED ORDER — CHLORHEXIDINE GLUCONATE 0.12 % MT SOLN: 15.0000 mL | Freq: Once | OROMUCOSAL | Status: DC

## 2020-11-13 MED ORDER — HEPARIN SODIUM (PORCINE) 1000 UNIT/ML IJ SOLN
INTRAMUSCULAR | Status: AC
  Filled 2020-11-13: qty 1

## 2020-11-13 MED ORDER — MORPHINE SULFATE (PF) 2 MG/ML IV SOLN
1.0000 mg | INTRAVENOUS | Status: DC | PRN
  Administered 2020-11-14 (×3): 2 mg via INTRAVENOUS
  Filled 2020-11-13 (×3): qty 1

## 2020-11-13 MED ORDER — PROPOFOL 10 MG/ML IV BOLUS
INTRAVENOUS | Status: DC | PRN
  Administered 2020-11-13: 110 mg via INTRAVENOUS
  Administered 2020-11-13: 40 mg via INTRAVENOUS

## 2020-11-13 MED ORDER — PLASMA-LYTE A IV SOLN
INTRAVENOUS | Status: DC | PRN
  Administered 2020-11-13: 1000 mL

## 2020-11-13 MED ORDER — METOPROLOL TARTRATE 5 MG/5ML IV SOLN: 2.5000 mg | INTRAVENOUS | Status: DC | PRN

## 2020-11-13 MED ORDER — PHENYLEPHRINE 40 MCG/ML (10ML) SYRINGE FOR IV PUSH (FOR BLOOD PRESSURE SUPPORT)
PREFILLED_SYRINGE | INTRAVENOUS | Status: DC | PRN
  Administered 2020-11-13 (×3): 80 ug via INTRAVENOUS
  Administered 2020-11-13: 150 ug via INTRAVENOUS
  Administered 2020-11-13: 80 ug via INTRAVENOUS

## 2020-11-13 SURGICAL SUPPLY — 87 items
BAG DECANTER FOR FLEXI CONT (MISCELLANEOUS) ×4 IMPLANT
BLADE CLIPPER SURG (BLADE) ×4 IMPLANT
BLADE STERNUM SYSTEM 6 (BLADE) ×4 IMPLANT
BLADE SURG 11 STRL SS (BLADE) ×4 IMPLANT
BNDG ELASTIC 4X5.8 VLCR STR LF (GAUZE/BANDAGES/DRESSINGS) ×4 IMPLANT
BNDG ELASTIC 6X5.8 VLCR STR LF (GAUZE/BANDAGES/DRESSINGS) ×4 IMPLANT
BNDG GAUZE ELAST 4 BULKY (GAUZE/BANDAGES/DRESSINGS) ×4 IMPLANT
CANISTER SUCT 3000ML PPV (MISCELLANEOUS) ×4 IMPLANT
CANNULA MC2 2 STG 29/37 NON-V (CANNULA) ×3 IMPLANT
CANNULA MC2 TWO STAGE (CANNULA) ×1
CANNULA NON VENT 20FR 12 (CANNULA) ×4 IMPLANT
CATH ROBINSON RED A/P 18FR (CATHETERS) ×8 IMPLANT
CONNECTOR BLAKE 2:1 CARIO BLK (MISCELLANEOUS) ×4 IMPLANT
CONTAINER PROTECT SURGISLUSH (MISCELLANEOUS) ×4 IMPLANT
DERMABOND ADVANCED (GAUZE/BANDAGES/DRESSINGS) ×1
DERMABOND ADVANCED .7 DNX12 (GAUZE/BANDAGES/DRESSINGS) ×3 IMPLANT
DRAIN CHANNEL 10M FLAT 3/4 FLT (DRAIN) ×4 IMPLANT
DRAIN CHANNEL 19F RND (DRAIN) ×12 IMPLANT
DRAIN CONNECTOR BLAKE 1:1 (MISCELLANEOUS) ×4 IMPLANT
DRAPE CARDIOVASCULAR INCISE (DRAPES) ×1
DRAPE INCISE IOBAN 66X45 STRL (DRAPES) IMPLANT
DRAPE SRG 135X102X78XABS (DRAPES) ×3 IMPLANT
DRAPE WARM FLUID 44X44 (DRAPES) ×4 IMPLANT
DRSG AQUACEL AG ADV 3.5X10 (GAUZE/BANDAGES/DRESSINGS) ×4 IMPLANT
ELECT BLADE 4.0 EZ CLEAN MEGAD (MISCELLANEOUS) ×4
ELECT REM PT RETURN 9FT ADLT (ELECTROSURGICAL) ×8
ELECTRODE BLDE 4.0 EZ CLN MEGD (MISCELLANEOUS) ×3 IMPLANT
ELECTRODE REM PT RTRN 9FT ADLT (ELECTROSURGICAL) ×6 IMPLANT
EVACUATOR SILICONE 100CC (DRAIN) ×4 IMPLANT
FELT TEFLON 1X6 (MISCELLANEOUS) ×4 IMPLANT
GAUZE 4X4 16PLY ~~LOC~~+RFID DBL (SPONGE) ×4 IMPLANT
GAUZE SPONGE 4X4 12PLY STRL (GAUZE/BANDAGES/DRESSINGS) ×8 IMPLANT
GAUZE SPONGE 4X4 12PLY STRL LF (GAUZE/BANDAGES/DRESSINGS) ×8 IMPLANT
GLOVE SURG ENC MOIS LTX SZ7 (GLOVE) ×8 IMPLANT
GLOVE SURG ENC TEXT LTX SZ7.5 (GLOVE) ×8 IMPLANT
GLOVE SURG MICRO LTX SZ6 (GLOVE) ×4 IMPLANT
GLOVE SURG POLYISO LF SZ7.5 (GLOVE) ×4 IMPLANT
GLOVE SURG UNDER POLY LF SZ6 (GLOVE) ×12 IMPLANT
GLOVE SURG UNDER POLY LF SZ6.5 (GLOVE) ×8 IMPLANT
GOWN STRL REUS W/ TWL LRG LVL3 (GOWN DISPOSABLE) ×21 IMPLANT
GOWN STRL REUS W/ TWL XL LVL3 (GOWN DISPOSABLE) ×6 IMPLANT
GOWN STRL REUS W/TWL LRG LVL3 (GOWN DISPOSABLE) ×7
GOWN STRL REUS W/TWL XL LVL3 (GOWN DISPOSABLE) ×2
HEMOSTAT POWDER SURGIFOAM 1G (HEMOSTASIS) ×8 IMPLANT
INSERT SUTURE HOLDER (MISCELLANEOUS) ×4 IMPLANT
KIT BASIN OR (CUSTOM PROCEDURE TRAY) ×4 IMPLANT
KIT SUCTION CATH 14FR (SUCTIONS) ×4 IMPLANT
KIT TURNOVER KIT B (KITS) ×4 IMPLANT
KIT VASOVIEW HEMOPRO 2 VH 4000 (KITS) ×4 IMPLANT
LEAD PACING MYOCARDI (MISCELLANEOUS) ×4 IMPLANT
MARKER GRAFT CORONARY BYPASS (MISCELLANEOUS) ×12 IMPLANT
NS IRRIG 1000ML POUR BTL (IV SOLUTION) ×20 IMPLANT
PACK ACCESSORY CANNULA KIT (KITS) ×4 IMPLANT
PACK E OPEN HEART (SUTURE) ×4 IMPLANT
PACK OPEN HEART (CUSTOM PROCEDURE TRAY) ×4 IMPLANT
PAD ARMBOARD 7.5X6 YLW CONV (MISCELLANEOUS) ×8 IMPLANT
PAD ELECT DEFIB RADIOL ZOLL (MISCELLANEOUS) ×4 IMPLANT
PENCIL BUTTON HOLSTER BLD 10FT (ELECTRODE) ×4 IMPLANT
POSITIONER HEAD DONUT 9IN (MISCELLANEOUS) ×4 IMPLANT
PUNCH AORTIC ROTATE 4.0MM (MISCELLANEOUS) ×4 IMPLANT
SET MPS 3-ND DEL (MISCELLANEOUS) ×8 IMPLANT
SPONGE T-LAP 18X18 ~~LOC~~+RFID (SPONGE) ×20 IMPLANT
SUPPORT HEART JANKE-BARRON (MISCELLANEOUS) ×4 IMPLANT
SUT BONE WAX W31G (SUTURE) ×4 IMPLANT
SUT ETHIBOND X763 2 0 SH 1 (SUTURE) ×8 IMPLANT
SUT MNCRL AB 3-0 PS2 18 (SUTURE) ×12 IMPLANT
SUT MNCRL AB 4-0 PS2 18 (SUTURE) ×4 IMPLANT
SUT PDS AB 1 CTX 36 (SUTURE) ×8 IMPLANT
SUT PROLENE 4 0 SH DA (SUTURE) ×4 IMPLANT
SUT PROLENE 5 0 C 1 36 (SUTURE) ×20 IMPLANT
SUT PROLENE 7 0 BV1 MDA (SUTURE) ×12 IMPLANT
SUT STEEL 6MS V (SUTURE) ×8 IMPLANT
SUT STEEL STERNAL CCS#1 18IN (SUTURE) ×12 IMPLANT
SUT VIC AB 1 CTX 36 (SUTURE) ×1
SUT VIC AB 1 CTX36XBRD ANBCTR (SUTURE) ×3 IMPLANT
SUT VIC AB 2-0 CT1 27 (SUTURE) ×1
SUT VIC AB 2-0 CT1 TAPERPNT 27 (SUTURE) ×3 IMPLANT
SYSTEM SAHARA CHEST DRAIN ATS (WOUND CARE) ×4 IMPLANT
TAPE CLOTH SURG 4X10 WHT LF (GAUZE/BANDAGES/DRESSINGS) ×4 IMPLANT
TOWEL GREEN STERILE (TOWEL DISPOSABLE) ×4 IMPLANT
TOWEL GREEN STERILE FF (TOWEL DISPOSABLE) ×4 IMPLANT
TRAY FOLEY SLVR 16FR TEMP STAT (SET/KITS/TRAYS/PACK) ×4 IMPLANT
TUBE CONNECTING 20X1/4 (TUBING) ×4 IMPLANT
TUBING LAP HI FLOW INSUFFLATIO (TUBING) ×4 IMPLANT
UNDERPAD 30X36 HEAVY ABSORB (UNDERPADS AND DIAPERS) ×4 IMPLANT
WATER STERILE IRR 1000ML POUR (IV SOLUTION) ×8 IMPLANT
YANKAUER SUCT BULB TIP NO VENT (SUCTIONS) ×4 IMPLANT

## 2020-11-13 NOTE — Brief Op Note (Signed)
11/06/2020 - 11/13/2020  2:41 PM  PATIENT:  Ricky Abbott  75 y.o. male  PRE-OPERATIVE DIAGNOSIS:  Coronary Artery Disease  POST-OPERATIVE DIAGNOSIS:  Coronary Artery Disease  PROCEDURE:    CORONARY ARTERY BYPASS GRAFTING x 4 -LIMA to LAD -SVG to OM -SVG to PDA -SVG to DIAGONAL  ENDOVEIN HARVEST OF RIGHT GREATER SAPHENOUS VEIN, 10 Flat drain placed in right thigh, APPLICATION OF CELL SAVER   SURGEON:  Surgeon(s) and Role:   Lajuana Matte, MD - Primary  PHYSICIAN ASSISTANT: Lars Pinks PA-C, Devonn Giampietro PA-C  ASSISTANTS: Dineen Kid RNFA   ANESTHESIA:   general  EBL: Per perfusion, anesthesia record  DRAINS:  Chest tubes placed in the mediastinal and pleural spaces    COUNTS CORRECT:  YES  DICTATION: .Dragon Dictation  PLAN OF CARE: Discharge to home after PACU  PATIENT DISPOSITION:  ICU - intubated and hemodynamically stable.   Delay start of Pharmacological VTE agent (>24hrs) due to surgical blood loss or risk of bleeding: yes  BASELINE WEIGHT: 81.6 kg

## 2020-11-13 NOTE — Progress Notes (Signed)
      [address]       ,[place] [id number]             [phone]      S/p CABG x 4  Just back from OR Intubated,sedated  BP (!) 102/56   Pulse 82   Temp 98.3 F (36.8 C) (Oral)   Resp 12   Ht 5\' 11"  (1.803 m)   Wt 81.6 kg   SpO2 98%   BMI 25.08 kg/m   Intake/Output Summary (Last 24 hours) at 11/13/2020 1807 Last data filed at 11/13/2020 1724 Gross per 24 hour  Intake 3400 ml  Output 2975 ml  Net 425 ml   Labs pending  Remo Lipps C. Roxan Hockey, MD [facility] [phone]

## 2020-11-13 NOTE — Progress Notes (Signed)
ANTICOAGULATION CONSULT NOTE - Follow Up Consult  Pharmacy Consult for IV Heparin Indication: chest pain/ACS  Allergies  Allergen Reactions   Peanut-Containing Drug Products Swelling    Swelling, rash, (Peanuts)    Patient Measurements: Height: 5\' 11"  (180.3 cm) Weight: 81.6 kg (179 lb 12.8 oz) IBW/kg (Calculated) : 75.3 Heparin Dosing Weight: 82 kg  Vital Signs: Temp: 98.2 F (36.8 C) (10/04 0525) Temp Source: Oral (10/04 0525) BP: 108/52 (10/04 0525) Pulse Rate: 75 (10/04 0525)  Labs: Recent Labs    11/11/20 0258 11/12/20 0157 11/13/20 0216  HGB 11.7* 11.6* 12.5*  HCT 36.4* 36.5* 39.3  PLT 150 165 182  APTT  --  66*  --   LABPROT  --  13.5  --   INR  --  1.0  --   HEPARINUNFRC 0.31 0.26* 0.38  CREATININE 0.79 0.97 0.90    Estimated Creatinine Clearance: 75.5 mL/min (by C-G formula based on SCr of 0.9 mg/dL).   Assessment: 75 yr old man with PMH relevant for CAD, COPD, DM, GERD, HTN, HLD, and sarcoidosis presented to [facility] ER with acute chest pain. Pharmacy was consulted to manage heparin infusion for ACS/chest pain. Pt is S/P cardiac cath, which showed severe 3-vessel disease. Plan is for CABG on 11/13/2020.   Heparin level therapeutic at 0.38, CBC stable. CABG planned this afternoon.  Goal of Therapy:  Heparin level 0.3-0.7 units/ml Monitor platelets by anticoagulation protocol: Yes  Plan:  Continue heparin 1400 units/h CABG today  Arrie Senate, PharmD, BCPS, [facility] Clinical Pharmacist [phone] Please check AMION for all [place] numbers 11/13/2020

## 2020-11-13 NOTE — Anesthesia Preprocedure Evaluation (Addendum)
Anesthesia Evaluation  Patient identified by MRN, date of birth, ID band Patient awake    Reviewed: Allergy & Precautions, NPO status , Patient's Chart, lab work & pertinent test results  Airway Mallampati: II  TM Distance: >3 FB Neck ROM: Full    Dental  (+) Teeth Intact   Pulmonary asthma , COPD, former smoker,  sarcoidosis   Pulmonary exam normal        Cardiovascular hypertension, Pt. on medications and Pt. on home beta blockers + angina + CAD, + Past MI and + Cardiac Stents (2014)   Rhythm:Regular Rate:Normal     Neuro/Psych Anxiety Depression    GI/Hepatic GERD  Medicated,(+) Hepatitis -  Endo/Other  diabetes, Type 2  Renal/GU   negative genitourinary   Musculoskeletal  (+) Arthritis , Osteoarthritis,    Abdominal (+)  Abdomen: soft.    Peds  Hematology negative hematology ROS (+)   Anesthesia Other Findings   Reproductive/Obstetrics                            Anesthesia Physical Anesthesia Plan  ASA: 4  Anesthesia Plan: General   Post-op Pain Management:    Induction: Intravenous  PONV Risk Score and Plan: 2 and Midazolam and Treatment may vary due to age or medical condition  Airway Management Planned: Mask and Oral ETT  Additional Equipment: Arterial line, CVP, TEE, 3D TEE, Ultrasound Guidance Line Placement and None  Intra-op Plan:   Post-operative Plan: Post-operative intubation/ventilation  Informed Consent: I have reviewed the patients History and Physical, chart, labs and discussed the procedure including the risks, benefits and alternatives for the proposed anesthesia with the patient or authorized representative who has indicated his/her understanding and acceptance.     Dental advisory given  Plan Discussed with: CRNA  Anesthesia Plan Comments: (Lab Results      Component                Value               Date                      WBC                       8.3                 11/13/2020                HGB                      12.5 (L)            11/13/2020                HCT                      39.3                11/13/2020                MCV                      86.9                11/13/2020                PLT  182                 11/13/2020           Lab Results      Component                Value               Date                      NA                       137                 11/13/2020                K                        4.6                 11/13/2020                CO2                      28                  11/13/2020                GLUCOSE                  123 (H)             11/13/2020                BUN                      20                  11/13/2020                CREATININE               0.90                11/13/2020                CALCIUM                  9.3                 11/13/2020                GFRNONAA                 >60                 11/13/2020           ECHO 11/07/20: 1. Distal septal and inferior hypokinesis . Left ventricular ejection  fraction, by estimation, is 45 to 50%. The left ventricle has mildly  decreased function. The left ventricle demonstrates regional wall motion  abnormalities (see scoring  diagram/findings for description). There is mild left ventricular  hypertrophy. Left ventricular diastolic parameters were normal.  2. Right ventricular systolic function is normal. The right ventricular  size is normal.  3. The mitral valve is abnormal. Trivial mitral valve regurgitation. No  evidence of mitral stenosis.  4. The aortic valve is tricuspid. Aortic  valve regurgitation is not  visualized. Mild to moderate aortic valve sclerosis/calcification is  present, without any evidence of aortic stenosis.  5. The inferior vena cava is normal in size with greater than 50%  respiratory variability, suggesting right atrial pressure of 3 mmHg.   Cath  11/06/20: Conclusions: 1. Severe three-vessel coronary artery disease, as detailed below. 2. Low normal left ventricular systolic function (LVEF 41-93%) with mid and apical inferior hypokinesis.  Mildly elevated left ventricular filling pressure (LVEDP 20-25 mmHg). )       Anesthesia Quick Evaluation

## 2020-11-13 NOTE — Plan of Care (Signed)
  Problem: Clinical Measurements: Goal: Ability to maintain clinical measurements within normal limits will improve Outcome: Progressing Goal: Respiratory complications will improve Outcome: Progressing   Problem: Activity: Goal: Risk for activity intolerance will decrease Outcome: Progressing   

## 2020-11-13 NOTE — Progress Notes (Addendum)
Progress Note  Patient Name: Ricky Abbott Date of Encounter: 11/13/2020  [facility] Cardiologist: Ida Rogue, MD   Subjective   Patient states his leg cramp is much better today. He denied any chest pain or SOB. He is waiting for CABG today, stats his cousin is coming later.   Inpatient Medications    Scheduled Meds:  aspirin EC  81 mg Oral Daily   bisacodyl  5 mg Oral Once   carvedilol  12.5 mg Oral BID WC   chlorhexidine  15 mL Mouth/Throat Once   Chlorhexidine Gluconate Cloth  6 each Topical Daily   Chlorhexidine Gluconate Cloth  6 each Topical Once   epinephrine  0-10 mcg/min Intravenous To OR   ezetimibe  10 mg Oral Daily   feeding supplement  237 mL Oral BID BM   heparin-papaverine-plasmalyte irrigation   Irrigation To OR   insulin aspart  0-15 Units Subcutaneous TID WC   insulin aspart  0-5 Units Subcutaneous QHS   insulin   Intravenous To OR   [facility] Blood Cardioplegia vial (lidocaine/magnesium/mannitol 0.26g-4g-6.4g)   Intracoronary To OR   metoprolol tartrate  12.5 mg Oral Once   multivitamin with minerals  1 tablet Oral Daily   pantoprazole  40 mg Oral Daily   phenylephrine  30-200 mcg/min Intravenous To OR   potassium chloride  80 mEq Other To OR   rosuvastatin  40 mg Oral Daily   senna-docusate  1 tablet Oral BID   sertraline  100 mg Oral Daily   tamsulosin  0.4 mg Oral QPC supper   tranexamic acid  15 mg/kg Intravenous To OR   tranexamic acid  2 mg/kg Intracatheter To OR   Continuous Infusions:   ceFAZolin (ANCEF) IV      ceFAZolin (ANCEF) IV     dexmedetomidine     heparin 30,000 units/NS 1000 mL solution for CELLSAVER     heparin 1,400 Units/hr (11/13/20 0518)   milrinone     nitroGLYCERIN 35 mcg/min (11/13/20 0054)   nitroGLYCERIN     norepinephrine     tranexamic acid (CYKLOKAPRON) infusion ([facility])     vancomycin     PRN Meds: acetaminophen, cyclobenzaprine, magnesium hydroxide, nitroGLYCERIN, ondansetron (ZOFRAN) IV,  oxyCODONE, temazepam   Vital Signs    Vitals:   11/12/20 0824 11/12/20 1511 11/12/20 2245 11/13/20 0525  BP: 111/61 114/66 108/63 (!) 108/52  Pulse: 73 (!) 113 80 75  Resp: 18 16 18 18   Temp: 97.6 F (36.4 C) (!) 97.5 F (36.4 C) 98.2 F (36.8 C) 98.2 F (36.8 C)  TempSrc: Oral Oral Oral Oral  SpO2: 97% 97% 100% 95%  Weight:      Height:        Intake/Output Summary (Last 24 hours) at 11/13/2020 0648 Last data filed at 11/13/2020 0516 Gross per 24 hour  Intake --  Output 1475 ml  Net -1475 ml    Last 3 Weights 11/06/2020 11/05/2020 08/01/2020  Weight (lbs) 179 lb 12.8 oz 194 lb 0.1 oz 194 lb 6 oz  Weight (kg) 81.557 kg 88 kg 88.168 kg      Telemetry    Sinus rhythm /bradycardia 40-60s - Personally Reviewed  ECG    N/A this AM - Personally Reviewed  Physical Exam   GEN: No acute distress. Sitting in chair.  Neck: No JVD Cardiac: RRR, no murmurs, rubs, or gallops.  Respiratory: Clear to auscultation bilaterally. GI: Soft, nontender, non-distended  MS: No BLE edema; No deformity.  Neuro:  Nonfocal  Psych: Normal affect   Labs    High Sensitivity Troponin:   Recent Labs  Lab 11/05/20 2152 11/06/20 0003 11/09/20 0207  TROPONINIHS 5 9 26*      Chemistry Recent Labs  Lab 11/11/20 0258 11/12/20 0157 11/12/20 0949 11/13/20 0216  NA 137 137  --  137  K 4.2 4.5  --  4.6  CL 103 101  --  100  CO2 27 29  --  28  GLUCOSE 126* 112*  --  123*  BUN 16 15  --  20  CREATININE 0.79 0.97  --  0.90  CALCIUM 8.9 8.9  --  9.3  MG 1.8  --  1.8  --   GFRNONAA >60 >60  --  >60  ANIONGAP 7 7  --  9     Lipids  No results for input(s): CHOL, TRIG, HDL, LABVLDL, LDLCALC, CHOLHDL in the last 168 hours.   Hematology Recent Labs  Lab 11/11/20 0258 11/12/20 0157 11/13/20 0216  WBC 6.7 6.8 8.3  RBC 4.21* 4.20* 4.52  HGB 11.7* 11.6* 12.5*  HCT 36.4* 36.5* 39.3  MCV 86.5 86.9 86.9  MCH 27.8 27.6 27.7  MCHC 32.1 31.8 31.8  RDW 15.1 15.3 15.3  PLT 150 165 182     Thyroid No results for input(s): TSH, FREET4 in the last 168 hours.  BNPNo results for input(s): BNP, PROBNP in the last 168 hours.  DDimer No results for input(s): DDIMER in the last 168 hours.   Radiology    DG Chest 2 View  Result Date: 11/12/2020 CLINICAL DATA:  Preoperative evaluation for CABG EXAM: CHEST - 2 VIEW COMPARISON:  10/16/2016 FINDINGS: Normal heart size, mediastinal contours, and pulmonary vascularity. Lungs clear. No pulmonary infiltrate, pleural effusion, or pneumothorax. Minor endplate spur formation thoracic spine. IMPRESSION: No acute abnormalities. Electronically Signed   By: Lavonia Dana M.D.   On: 11/12/2020 08:05   VAS [place] LOWER EXTREMITY VENOUS (DVT)  Result Date: 11/11/2020  Lower Venous DVT Study Patient Name:  Ricky Abbott  Date of Exam:   11/11/2020 Medical Rec #: [id number]          Accession #:    [id number] Date of Birth: 04-Sep-1945          Patient Gender: M Patient Age:   75 years Exam Location:  [facility] Procedure:      VAS [place] LOWER EXTREMITY VENOUS (DVT) Referring Phys: Minus Breeding --------------------------------------------------------------------------------  Indications: Pain, and Calf cramping.  Comparison Study: No prior study Performing Technologist: Sharion Dove RVS  Examination Guidelines: A complete evaluation includes B-mode imaging, spectral Doppler, color Doppler, and power Doppler as needed of all accessible portions of each vessel. Bilateral testing is considered an integral part of a complete examination. Limited examinations for reoccurring indications may be performed as noted. The reflux portion of the exam is performed with the patient in reverse Trendelenburg.  +---------+---------------+---------+-----------+----------+--------------+ RIGHT    CompressibilityPhasicitySpontaneityPropertiesThrombus Aging +---------+---------------+---------+-----------+----------+--------------+ CFV      Full           Yes      Yes                                  +---------+---------------+---------+-----------+----------+--------------+ SFJ      Full                                                        +---------+---------------+---------+-----------+----------+--------------+  FV Prox  Full                                                        +---------+---------------+---------+-----------+----------+--------------+ FV Mid   Full                                                        +---------+---------------+---------+-----------+----------+--------------+ FV DistalFull                                                        +---------+---------------+---------+-----------+----------+--------------+ PFV      Full                                                        +---------+---------------+---------+-----------+----------+--------------+ POP      Full           Yes                                          +---------+---------------+---------+-----------+----------+--------------+ PTV      Full                                                        +---------+---------------+---------+-----------+----------+--------------+ PERO     Full                                                        +---------+---------------+---------+-----------+----------+--------------+ Soleal   Full                                                        +---------+---------------+---------+-----------+----------+--------------+ Gastroc  Full                                                        +---------+---------------+---------+-----------+----------+--------------+ GSV      Full                                                        +---------+---------------+---------+-----------+----------+--------------+   +----+---------------+---------+-----------+----------+--------------+  LEFTCompressibilityPhasicitySpontaneityPropertiesThrombus Aging  +----+---------------+---------+-----------+----------+--------------+ CFV Full           Yes      Yes                                 +----+---------------+---------+-----------+----------+--------------+     Summary: RIGHT: - No evidence of deep vein thrombosis in the lower extremity. No indirect evidence of obstruction proximal to the inguinal ligament.  LEFT: - No evidence of common femoral vein obstruction.  *See table(s) above for measurements and observations. Electronically signed by Harold Barban MD on 11/11/2020 at 4:05:25 PM.    Final     Cardiac Studies   Left Cardiac Catheterization 11/06/2020: Conclusions: Severe three-vessel coronary artery disease, as detailed below. Low normal left ventricular systolic function (LVEF 44-31%) with mid and apical inferior hypokinesis.  Mildly elevated left ventricular filling pressure (LVEDP 20-25 mmHg).   Recommendations: Transfer to [facility] for cardiac surgery consultation. Hold clopidogrel pending surgical consultation. Restart IV heparin 2 hours after TR band removal. Aggressive secondary prevention.   Diagnostic Dominance: Co-dominant _______________   Echocardiogram 11/07/2020: Impressions: 1. Distal septal and inferior hypokinesis . Left ventricular ejection  fraction, by estimation, is 45 to 50%. The left ventricle has mildly  decreased function. The left ventricle demonstrates regional wall motion  abnormalities (see scoring  diagram/findings for description). There is mild left ventricular  hypertrophy. Left ventricular diastolic parameters were normal.   2. Right ventricular systolic function is normal. The right ventricular  size is normal.   3. The mitral valve is abnormal. Trivial mitral valve regurgitation. No  evidence of mitral stenosis.   4. The aortic valve is tricuspid. Aortic valve regurgitation is not  visualized. Mild to moderate aortic valve sclerosis/calcification is  present, without any evidence of  aortic stenosis.   5. The inferior vena cava is normal in size with greater than 50%  respiratory variability, suggesting right atrial pressure of 3 mmHg.     Patient Profile     75 y.o. male with a history of CAD s/p remote PCI to RCA and then MI in 2014 s/p PCI x2 to LAD, sarcoidosis of the lung in remission, COPD, hypertension, hyperlipidemia, hepatitis, PTSD from 3 tours in [place], depression, prior tobacco use (quit in 1984), type 2 DM, and alcohol use who was admitted at [facility] on 11/06/2020 with unstable angina. Cardiac catheterization showed severe 3-vessel CAD. Patient was transferred to [facility] for [facility] consultation.    Assessment & Plan    Unstable Angina 3 vessel CAD s/p Multiple PCI - HS trop 5 >9>26 - severe episode of chest pain 9/29 night with acute ST depression on 9/30 EKG, no recurrence of CP so far  - LHC on 9/27 showed severe 3 vessel disease as noted above.  - Echo showed LVEF of 45-50% with distal septal and inferior hypokinesis and mild LVH. No significant valvular disease noted. - Doppler from 11/11/20 negative for DVTs.  - Continue IV Heparin and IV Nitro  - Medical therapy: Plavix stopped on 9/27. Continue Aspirin. Will reduce Coreg from 12.5mg  to 6.125 mg twice daily due to bradycardia down to 40s today, Crestor 40mg  daily, and Zetia 10mg  daily. - Patient has been seen by CT Surgery. Plan is for CABG on 11/13/2020 after Plavix washout.  Right leg cramp - Doppler negative for DVT  - Mag 1.8, optimized yesterday, leg cramp resolved     Hypertension - BP  controlled, low normal  - Continue IV Nitro - Continue Coreg 12.5mg  twice daily. - home med Imdur has been held   Hyperlipidemia - LDL 67 from 11/06/20  - Continue Crestor 40mg  daily and Zetia 10mg  daily.    Type 2 Diabetes Mellitus - Hemoglobin A1c 6.2 on 11/12/20 , historically diet controlled at home  - Continue short acting sliding scale insulin AC while in house , goal 140-180s     COPD Pulmonary Sarcoidosis - no acute exacerbation, not on meds at home  - Pre-CABG PFTs completed on 11/07/2020, moderate obstructive disease.  - will order PRN albuterol today    PTSD - Continue home Zoloft   GERD - Continue home Protonix  BPH  - continue Flomax      For questions or updates, please contact [facility] Please consult [email or web address] for contact info under        Signed, Margie Billet, NP  11/13/2020, 6:48 AM

## 2020-11-13 NOTE — Anesthesia Procedure Notes (Signed)
Central Venous Catheter Insertion Performed by: Darral Dash, DO, anesthesiologist Start/End10/05/2020 12:45 PM, 11/13/2020 12:55 PM Patient location: OR. Preanesthetic checklist: patient identified, IV checked, site marked, risks and benefits discussed, surgical consent, monitors and equipment checked, pre-op evaluation, timeout performed and anesthesia consent Position: Trendelenburg Patient sedated Hand hygiene performed  and maximum sterile barriers used  Catheter size: 9 Fr Central line was placed.MAC introducer Procedure performed using ultrasound guided technique. Ultrasound Notes:anatomy identified, needle tip was noted to be adjacent to the nerve/plexus identified, no ultrasound evidence of intravascular and/or intraneural injection and image(s) printed for medical record Attempts: 1 Following insertion, line sutured, dressing applied and Biopatch. Post procedure assessment: blood return through all ports, free fluid flow and no air  Patient tolerated the procedure well with no immediate complications.

## 2020-11-13 NOTE — Progress Notes (Signed)
     [address]       [place] [address]             [phone]        No events  Vitals:   11/12/20 2245 11/13/20 0525  BP: 108/63 (!) 108/52  Pulse: 80 75  Resp: 18 18  Temp: 98.2 F (36.8 C) 98.2 F (36.8 C)  SpO2: 100% 95%   Alert NAD Sinus EWOB  OR today for CABG  Ricky Abbott O Ricky Abbott

## 2020-11-13 NOTE — Op Note (Signed)
[address]       [place] [address]             [phone]                                          11/13/2020 Patient:  Isa Rankin [place]craft Pre-Op Dx: NSTEMI   Three-vessel coronary artery disease   COPD   Hypertension   Diabetes mellitus Post-op Dx: Same Procedure: CABG X 4.  LIMA to LAD, reverse saphenous vein graft to PDA, reverse saphenous vein graft to OM1, reverse saphenous vein graft to the first diagonal. Endoscopic greater saphenous vein harvest on the right   Surgeon and Role:      * Terriana Barreras, Lucile Crater, MD - Primary    *D. Tacy Dura, PA-C- assisting Assistant: Leretha Pol, PA-C  Anesthesia  general EBL: 500 ml Blood Administration: none Xclamp Time:  65 min Pump Time:  108 min  Drains: 19 F blake drain: R, L, mediastinal  Wires: None Counts: correct   Indications: Is a 75 year old gentleman who was transferred from [facility] with an NSTEMI.  He underwent a left heart cath which showed severe three-vessel coronary artery disease.  Echocardiogram did not show any significant valvular disease, and he had mild left ventricular hypokinesis.  He was previously on Plavix and had to undergo a washout prior to surgery. Findings: Good LIMA, good vein.  Calcified LAD but decent target.  The PDA was very small.  The obtuse marginal was a soft vessel but was also small.  The diagonal vessel was heavily calcified but overall good target.  Operative Technique: All invasive lines were placed in pre-op holding.  After the risks, benefits and alternatives were thoroughly discussed, the patient was brought to the operative theatre.  Anesthesia was induced, and the patient was prepped and draped in normal sterile fashion.  An appropriate surgical pause was performed, and pre-operative antibiotics were dosed accordingly.  We began with simultaneous incisions along the right leg for harvesting of the greater saphenous vein and the chest  for the sternotomy.  In regards to the sternotomy, this was carried down with bovie cautery, and the sternum was divided with a reciprocating saw.  Meticulous hemostasis was obtained.  The left internal thoracic artery was exposed and harvested in in pedicled fashion.  The patient was systemically heparinized, and the artery was divided distally, and placed in a papaverine sponge.    The sternal elevator was removed, and a retractor was placed.  The pericardium was divided in the midline and fashioned into a cradle with pericardial stitches.   After we confirmed an appropriate ACT, the ascending aorta was cannulated in standard fashion.  The right atrial appendage was used for venous cannulation site.  Cardiopulmonary bypass was initiated, and the heart retractor was placed. The cross clamp was applied, and a dose of anterograde cardioplegia was given with good arrest of the heart.  We moved to the posterior wall of the heart, and found a good target on the PDA.  An arteriotomy was made, and the vein graft was anastomosed to it in an end to side fashion.  Next we exposed the lateral wall, and found a good target on the obtuse marginal.  An end to side anastomosis with the vein graft was then created.  Next, we exposed the anterior wall of  the heart and identified a good target on diagonal.   An arteriotomy was created.  The vein was anastomosed in an end to side fashion.  Finally, we exposed a good target on the LAD, and fashioned an end to side anastomosis between it and the LITA.  We began to re-warm, and a re-animation dose of cardioplegia was given.  The heart was de-aired, and the cross clamp was removed.  Meticulous hemostasis was obtained.    A partial occludding clamp was then placed on the ascending aorta, and we created an end to side anastomosis between it and the proximal vein grafts.  The proximal sites were marked with rings.  Hemostasis was obtained, and we separated from cardiopulmonary bypass  without event.  The heparin was reversed with protamine.  Chest tubes and wires were placed, and the sternum was re-approximated with sternal wires.  The soft tissue and skin were re-approximated wth absorbable suture.    The patient tolerated the procedure without any immediate complications, and was transferred to the ICU in guarded condition.  Watt Geiler Bary Leriche

## 2020-11-13 NOTE — Hospital Course (Addendum)
History of Present Illness:  Mr. Ricky Abbott is a 75 year old gentleman with past history of coronary artery disease having PTCI of the right coronary and LAD in 2014.  He has been on Plavix since that time.  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  He describes having worsening exertional angina for about 1 year but developed an intense episode in January while at rest on 11/05/2020.  He took some old nitroglycerin tablets he had at home without effect.  EMS was summoned and he was given additional sublingual nitroglycerin without improvement in his pain.  He was taken to the [facility] ED.  Work-up in the emergency room included an EKG that showed sinus rhythm with right bundle branch block.  Serial high-sensitivity troponin levels were 5 and 9.  Chest x-ray showed no acute process.  He was started on a heparin infusion treated with nitroglycerin paste.  His pain resolved.  He was admitted to the hospital and cardiology was consulted.  Left heart catheterization was carried out yesterday demonstrating severe three-vessel coronary artery disease.  Left ventricular ejection fraction was 55 to 60%.  The left atrium was mildly dilated the right ventricle demonstrated normal size and systolic function.   Ricky Abbott was transferred to [facility] for further evaluation and CT surgery consultation for coronary bypass grafting.  Hospital Course:  Ricky Abbott continued to have episodes of "stuttering" chest pain which relieved with NTG.  He was evaluated by Dr. Kipp Brood who was in agreement the patient would benefit from coronary artery bypass grafting.  The risks and benefits of the procedure were explained to the patient and he was agreeable to proceed.  He was taken to the operating room on 11/13/2020.  He underwent CABG x 10/4  utilizing LIMA to LAD -SVG to OM -SVG to PDA -SVG to DIAGONAL.  He also underwent endoscopic harvest of greater saphenous vein.  He tolerated the procedure without difficulty and was taken to the SICU in stable condition.   The patient was somewhat difficult to extubate using standard protocols but this was accomplished over time.  He has remained hemodynamically stable but has required Neo-Synephrine early for blood pressure support.  This was able to be weaned over time.  He was given extra volume as well as started on midodrine.  He does have some postoperative expected volume overloaded has been started on a course of diuretics.  He does have expected acute blood loss anemia which is being monitored clinically.  He has also been started on iron.  Chest tubes were removed on postoperative day #2.  He is started on a course of routine postoperative pulmonary toilet and cardiac rehabilitation. He did have some hypotension therefore he was started on midodrine. We were weaning him off of oxygen as tolerated.

## 2020-11-13 NOTE — Anesthesia Procedure Notes (Signed)
Procedure Name: Intubation Date/Time: 11/13/2020 12:21 PM Performed by: Reece Agar, CRNA Pre-anesthesia Checklist: Emergency Drugs available, Patient identified, Suction available and Patient being monitored Patient Re-evaluated:Patient Re-evaluated prior to induction Oxygen Delivery Method: Circle System Utilized Preoxygenation: Pre-oxygenation with 100% oxygen Induction Type: IV induction Ventilation: Mask ventilation without difficulty Laryngoscope Size: Miller and 2 Grade View: Grade I Tube type: Oral Tube size: 8.0 mm Number of attempts: 1 Airway Equipment and Method: Stylet and Oral airway Placement Confirmation: ETT inserted through vocal cords under direct vision, positive ETCO2 and breath sounds checked- equal and bilateral Secured at: 22 cm Tube secured with: Tape Dental Injury: Teeth and Oropharynx as per pre-operative assessment

## 2020-11-13 NOTE — Anesthesia Procedure Notes (Signed)
Arterial Line Insertion Start/End10/05/2020 12:25 PM, 11/13/2020 12:30 PM Performed by: Darral Dash, DO, anesthesiologist  Patient location: OR. Preanesthetic checklist: patient identified, IV checked, site marked, risks and benefits discussed, surgical consent, monitors and equipment checked, pre-op evaluation, timeout performed and anesthesia consent Lidocaine 1% used for infiltration Right, brachial was placed Catheter size: 20 G Hand hygiene performed  and maximum sterile barriers used   Attempts: 1 Procedure performed using ultrasound guided technique. Ultrasound Notes:anatomy identified, needle tip was noted to be adjacent to the nerve/plexus identified and no ultrasound evidence of intravascular and/or intraneural injection Following insertion, dressing applied and Biopatch. Post procedure assessment: normal and unchanged  Patient tolerated the procedure well with no immediate complications.

## 2020-11-13 NOTE — Discharge Instructions (Addendum)
Discharge Instructions:  1. You may shower, please wash incisions daily with soap and water and keep dry.  If you wish to cover wounds with dressing you may do so but please keep clean and change daily.  No tub baths or swimming until incisions have completely healed.  If your incisions become red or develop any drainage please call our office at [phone]  2. No Driving until cleared by Dr. Lightfoot's office and you are no longer using narcotic pain medications  3. Monitor your weight daily.. Please use the same scale and weigh at same time... If you gain 3-5 lbs in 48 hours with associated lower extremity swelling, please contact our office at [phone]  4. Fever of 101.5 for at least 24 hours with no source, please contact our office at [phone]  5. Activity- up as tolerated, please walk at least 3 times per day.  Avoid strenuous activity, no lifting, pushing, or pulling with your arms over 8-10 lbs for a minimum of 6 weeks  6. If any questions or concerns arise, please do not hesitate to contact our office at [phone] 

## 2020-11-14 ENCOUNTER — Ambulatory Visit: Payer: Medicare Other | Admitting: Physician Assistant

## 2020-11-14 ENCOUNTER — Inpatient Hospital Stay (HOSPITAL_COMMUNITY): Payer: Medicare Other

## 2020-11-14 ENCOUNTER — Encounter (HOSPITAL_COMMUNITY): Payer: Self-pay | Admitting: Thoracic Surgery (Cardiothoracic Vascular Surgery)

## 2020-11-14 LAB — GLUCOSE, CAPILLARY
Glucose-Capillary: 126 mg/dL — ABNORMAL HIGH (ref 70–99)
Glucose-Capillary: 131 mg/dL — ABNORMAL HIGH (ref 70–99)
Glucose-Capillary: 132 mg/dL — ABNORMAL HIGH (ref 70–99)
Glucose-Capillary: 133 mg/dL — ABNORMAL HIGH (ref 70–99)
Glucose-Capillary: 135 mg/dL — ABNORMAL HIGH (ref 70–99)
Glucose-Capillary: 136 mg/dL — ABNORMAL HIGH (ref 70–99)
Glucose-Capillary: 140 mg/dL — ABNORMAL HIGH (ref 70–99)
Glucose-Capillary: 148 mg/dL — ABNORMAL HIGH (ref 70–99)
Glucose-Capillary: 150 mg/dL — ABNORMAL HIGH (ref 70–99)
Glucose-Capillary: 152 mg/dL — ABNORMAL HIGH (ref 70–99)
Glucose-Capillary: 157 mg/dL — ABNORMAL HIGH (ref 70–99)
Glucose-Capillary: 163 mg/dL — ABNORMAL HIGH (ref 70–99)
Glucose-Capillary: 169 mg/dL — ABNORMAL HIGH (ref 70–99)
Glucose-Capillary: 174 mg/dL — ABNORMAL HIGH (ref 70–99)

## 2020-11-14 LAB — POCT I-STAT 7, (LYTES, BLD GAS, ICA,H+H)
Acid-base deficit: 1 mmol/L (ref 0.0–2.0)
Acid-base deficit: 5 mmol/L — ABNORMAL HIGH (ref 0.0–2.0)
Acid-base deficit: 6 mmol/L — ABNORMAL HIGH (ref 0.0–2.0)
Bicarbonate: 24.1 mmol/L (ref 20.0–28.0)
Bicarbonate: 19.4 mmol/L — ABNORMAL LOW (ref 20.0–28.0)
Bicarbonate: 19.1 mmol/L — ABNORMAL LOW (ref 20.0–28.0)
Calcium, Ion: 1.27 mmol/L (ref 1.15–1.40)
Calcium, Ion: 1.07 mmol/L — ABNORMAL LOW (ref 1.15–1.40)
Calcium, Ion: 1.08 mmol/L — ABNORMAL LOW (ref 1.15–1.40)
HCT: 30 % — ABNORMAL LOW (ref 39.0–52.0)
HCT: 27 % — ABNORMAL LOW (ref 39.0–52.0)
HCT: 26 % — ABNORMAL LOW (ref 39.0–52.0)
Hemoglobin: 10.2 g/dL — ABNORMAL LOW (ref 13.0–17.0)
Hemoglobin: 9.2 g/dL — ABNORMAL LOW (ref 13.0–17.0)
Hemoglobin: 8.8 g/dL — ABNORMAL LOW (ref 13.0–17.0)
O2 Saturation: 98 %
O2 Saturation: 99 %
O2 Saturation: 99 %
Patient temperature: 36.5
Patient temperature: 37.7
Patient temperature: 37.5
Potassium: 4.3 mmol/L (ref 3.5–5.1)
Potassium: 3.7 mmol/L (ref 3.5–5.1)
Potassium: 3.6 mmol/L (ref 3.5–5.1)
Sodium: 139 mmol/L (ref 135–145)
Sodium: 139 mmol/L (ref 135–145)
Sodium: 141 mmol/L (ref 135–145)
TCO2: 25 mmol/L (ref 22–32)
TCO2: 20 mmol/L — ABNORMAL LOW (ref 22–32)
TCO2: 20 mmol/L — ABNORMAL LOW (ref 22–32)
pCO2 arterial: 41.4 mmHg (ref 32.0–48.0)
pCO2 arterial: 31.7 mmHg — ABNORMAL LOW (ref 32.0–48.0)
pCO2 arterial: 33.8 mmHg (ref 32.0–48.0)
pH, Arterial: 7.37 (ref 7.350–7.450)
pH, Arterial: 7.397 (ref 7.350–7.450)
pH, Arterial: 7.363 (ref 7.350–7.450)
pO2, Arterial: 100 mmHg (ref 83.0–108.0)
pO2, Arterial: 152 mmHg — ABNORMAL HIGH (ref 83.0–108.0)
pO2, Arterial: 120 mmHg — ABNORMAL HIGH (ref 83.0–108.0)

## 2020-11-14 LAB — BASIC METABOLIC PANEL
Anion gap: 6 (ref 5–15)
Anion gap: 10 (ref 5–15)
BUN: 18 mg/dL (ref 8–23)
BUN: 22 mg/dL (ref 8–23)
CO2: 24 mmol/L (ref 22–32)
CO2: 20 mmol/L — ABNORMAL LOW (ref 22–32)
Calcium: 8 mg/dL — ABNORMAL LOW (ref 8.9–10.3)
Calcium: 8.2 mg/dL — ABNORMAL LOW (ref 8.9–10.3)
Chloride: 103 mmol/L (ref 98–111)
Chloride: 103 mmol/L (ref 98–111)
Creatinine, Ser: 0.98 mg/dL (ref 0.61–1.24)
Creatinine, Ser: 1.02 mg/dL (ref 0.61–1.24)
GFR, Estimated: >60 mL/min (ref >60)
GFR, Estimated: >60 mL/min (ref >60)
Glucose, Bld: 129 mg/dL — ABNORMAL HIGH (ref 70–99)
Glucose, Bld: 215 mg/dL — ABNORMAL HIGH (ref 70–99)
Potassium: 4.6 mmol/L (ref 3.5–5.1)
Potassium: 4.6 mmol/L (ref 3.5–5.1)
Sodium: 133 mmol/L — ABNORMAL LOW (ref 135–145)
Sodium: 133 mmol/L — ABNORMAL LOW (ref 135–145)

## 2020-11-14 LAB — CBC
HCT: 33 % — ABNORMAL LOW (ref 39.0–52.0)
HCT: 32.9 % — ABNORMAL LOW (ref 39.0–52.0)
Hemoglobin: 10.7 g/dL — ABNORMAL LOW (ref 13.0–17.0)
Hemoglobin: 10.6 g/dL — ABNORMAL LOW (ref 13.0–17.0)
MCH: 28.5 pg (ref 26.0–34.0)
MCH: 28.6 pg (ref 26.0–34.0)
MCHC: 32.4 g/dL (ref 30.0–36.0)
MCHC: 32.2 g/dL (ref 30.0–36.0)
MCV: 87.8 fL (ref 80.0–100.0)
MCV: 88.7 fL (ref 80.0–100.0)
Platelets: 150 10*3/uL (ref 150–400)
Platelets: 177 10*3/uL (ref 150–400)
RBC: 3.76 MIL/uL — ABNORMAL LOW (ref 4.22–5.81)
RBC: 3.71 MIL/uL — ABNORMAL LOW (ref 4.22–5.81)
RDW: 15.3 % (ref 11.5–15.5)
RDW: 15.5 % (ref 11.5–15.5)
WBC: 16.9 10*3/uL — ABNORMAL HIGH (ref 4.0–10.5)
WBC: 19 10*3/uL — ABNORMAL HIGH (ref 4.0–10.5)
nRBC: 0 % (ref 0.0–0.2)
nRBC: 0 % (ref 0.0–0.2)

## 2020-11-14 LAB — MAGNESIUM
Magnesium: 2.7 mg/dL — ABNORMAL HIGH (ref 1.7–2.4)
Magnesium: 2.6 mg/dL — ABNORMAL HIGH (ref 1.7–2.4)

## 2020-11-14 MED ORDER — MIDODRINE HCL 5 MG PO TABS
10.0000 mg | ORAL_TABLET | Freq: Three times a day (TID) | ORAL | Status: DC
  Administered 2020-11-14 – 2020-11-17 (×10): 10 mg via ORAL
  Filled 2020-11-14 (×9): qty 2

## 2020-11-14 MED ORDER — INSULIN ASPART 100 UNIT/ML IJ SOLN: 0.0000 [IU] | INTRAMUSCULAR | Status: DC

## 2020-11-14 MED ORDER — ENOXAPARIN SODIUM 40 MG/0.4ML IJ SOSY
40.0000 mg | PREFILLED_SYRINGE | Freq: Every day | INTRAMUSCULAR | Status: DC
  Administered 2020-11-14 – 2020-11-19 (×6): 40 mg via SUBCUTANEOUS
  Filled 2020-11-14 (×6): qty 0.4

## 2020-11-14 MED ORDER — TRAMADOL HCL 50 MG PO TABS
50.0000 mg | ORAL_TABLET | ORAL | Status: DC | PRN
  Administered 2020-11-14 – 2020-11-15 (×2): 100 mg via ORAL
  Filled 2020-11-14 (×2): qty 2

## 2020-11-14 MED ORDER — OXYCODONE HCL 5 MG PO TABS
5.0000 mg | ORAL_TABLET | ORAL | Status: DC | PRN
  Administered 2020-11-14 – 2020-11-18 (×5): 10 mg via ORAL
  Administered 2020-11-19: 5 mg via ORAL
  Filled 2020-11-14 (×5): qty 2
  Filled 2020-11-14: qty 1

## 2020-11-14 MED ORDER — CLOPIDOGREL BISULFATE 75 MG PO TABS
75.0000 mg | ORAL_TABLET | Freq: Every day | ORAL | Status: DC
  Administered 2020-11-14 – 2020-11-20 (×7): 75 mg via ORAL
  Filled 2020-11-14 (×7): qty 1

## 2020-11-14 MED ORDER — INSULIN ASPART 100 UNIT/ML IJ SOLN
0.0000 [IU] | INTRAMUSCULAR | Status: DC
  Administered 2020-11-14: 2 [IU] via SUBCUTANEOUS
  Administered 2020-11-14: 4 [IU] via SUBCUTANEOUS
  Administered 2020-11-14: 1 [IU] via SUBCUTANEOUS
  Administered 2020-11-15 (×3): 2 [IU] via SUBCUTANEOUS
  Administered 2020-11-15: 4 [IU] via SUBCUTANEOUS
  Administered 2020-11-15: 2 [IU] via SUBCUTANEOUS

## 2020-11-14 MED ORDER — SODIUM CHLORIDE 0.9% FLUSH
10.0000 mL | Freq: Two times a day (BID) | INTRAVENOUS | Status: DC
  Administered 2020-11-14: 40 mL
  Administered 2020-11-15: 10 mL

## 2020-11-14 MED ORDER — SODIUM CHLORIDE 0.9% FLUSH: 10.0000 mL | INTRAVENOUS | Status: DC | PRN

## 2020-11-14 MED ORDER — ASPIRIN EC 81 MG PO TBEC
81.0000 mg | DELAYED_RELEASE_TABLET | Freq: Every day | ORAL | Status: DC
  Administered 2020-11-15 – 2020-11-20 (×6): 81 mg via ORAL
  Filled 2020-11-14 (×6): qty 1

## 2020-11-14 NOTE — Progress Notes (Signed)
Attempted rapid vent wean x2 thus far tonight and both times patient has prolonged periods of apnea unless he gets repeated verbal stimulation. Wean has been aborted due to this. Will continue to monitor for appropriateness to rapid wean.

## 2020-11-14 NOTE — Progress Notes (Addendum)
[address]       [place] [address]             [phone]      1 Day Post-Op  Procedure(s) (LRB): CORONARY ARTERY BYPASS GRAFTING (CABG) x 4 ON CARDIOPULMONARY BYPASS USING LIMA AND RIGHT GSV. -LIMA to LAD -SVG to OM -SVG to PDA -SVG to DIAGONAL (N/A) TRANSESOPHAGEAL ECHOCARDIOGRAM (TEE) (N/A) ENDOVEIN HARVEST OF GREATER SAPHENOUS VEIN (Right) APPLICATION OF CELL SAVER (N/A)   Total Length of Stay:  LOS: 8 days    SUBJECTIVE:  Vitals:   11/14/20 1415 11/14/20 1430  BP:    Pulse: 88 98  Resp: (!) 26 (!) 26  Temp:    SpO2:  95%    Intake/Output      10/04 0701 10/05 0700 10/05 0701 10/06 0700   P.O. 30 717   I.V. (mL/kg) 4589.5 (52.8) 445.8 (5.1)   Blood 520    IV Piggyback 988.6 310   Total Intake(mL/kg) 6128.1 (70.5) 1472.8 (16.9)   Urine (mL/kg/hr) 2335 (1.1) 245 (0.3)   Drains 26    Blood 900    Chest Tube 330 30   Total Output 3591 275   Net +2537.1 +1197.8            sodium chloride Stopped (11/14/20 1337)   sodium chloride 250 mL (11/14/20 1215)   sodium chloride     albumin human 60 mL/hr at 11/14/20 1400    ceFAZolin (ANCEF) IV Stopped (11/14/20 1335)   lactated ringers     lactated ringers     lactated ringers Stopped (11/13/20 2020)   phenylephrine (NEO-SYNEPHRINE) Adult infusion 30 mcg/min (11/14/20 1400)    CBC    Component Value Date/Time   WBC 16.9 (H) 11/14/2020 0404   RBC 3.76 (L) 11/14/2020 0404   HGB 8.8 (L) 11/14/2020 1013   HCT 26.0 (L) 11/14/2020 1013   PLT 150 11/14/2020 0404   MCV 87.8 11/14/2020 0404   MCH 28.5 11/14/2020 0404   MCHC 32.4 11/14/2020 0404   RDW 15.3 11/14/2020 0404   LYMPHSABS 1.2 11/05/2020 2152   MONOABS 0.9 11/05/2020 2152   EOSABS 0.7 (H) 11/05/2020 2152   BASOSABS 0.1 11/05/2020 2152   CMP     Component Value Date/Time   NA 141 11/14/2020 1013   NA 136 09/09/2019 1356   K 3.6 11/14/2020 1013   CL 103 11/14/2020 0404   CO2 24 11/14/2020 0404   GLUCOSE 129 (H)  11/14/2020 0404   BUN 18 11/14/2020 0404   BUN 22 09/09/2019 1356   CREATININE 0.98 11/14/2020 0404   CALCIUM 8.0 (L) 11/14/2020 0404   PROT 7.2 11/05/2020 2152   PROT 7.1 09/09/2019 1356   ALBUMIN 4.1 11/05/2020 2152   ALBUMIN 4.5 09/09/2019 1356   AST 26 11/05/2020 2152   ALT 30 11/05/2020 2152   ALKPHOS 75 11/05/2020 2152   BILITOT 1.0 11/05/2020 2152   BILITOT 0.4 09/09/2019 1356   GFRNONAA >60 11/14/2020 0404   GFRAA 93 09/09/2019 1356   ABG    Component Value Date/Time   PHART 7.363 11/14/2020 1013   PCO2ART 33.8 11/14/2020 1013   PO2ART 120 (H) 11/14/2020 1013   HCO3 19.1 (L) 11/14/2020 1013   TCO2 20 (L) 11/14/2020 1013   ACIDBASEDEF 6.0 (H) 11/14/2020 1013   O2SAT 99.0 11/14/2020 1013   CBG (last 3)  Recent Labs    11/14/20 1109 11/14/20 1212 11/14/20 1307  GLUCAP 148* 174* 140*  ASSESSMENT: SR-On Neo Syneprhine drip and weaning as able. Also, on Midodrine 10 mg tid and Lopressor 12.5 mg bid. He was also started on Plavix this afternoon (NSTEMI) Patient extubated earlier this am. On 2 L [place]. Chest tubes with 30 cc so far today Per nurse, patient is requiring a fair amount of Morphine so will give range for Oxy and Ultram PRN  Nani Skillern, PA-C 11/14/2020 3:08 pm  Agree with above Stable since extubation Hemodynamically stable in sinus rhythm. Weaning neo.  Pm labs ok. Has not voided since foley out at 1 pm.

## 2020-11-14 NOTE — Progress Notes (Signed)
Another attempt made at rapid wean. Patient continues with apnea unless constantly verbally stimulated. Rapid wean aborted again. Will relay problems with weaning to day shift.

## 2020-11-14 NOTE — Procedures (Signed)
Extubation Procedure Note  Patient Details:   Name: Ricky Abbott DOB: 12-23-1945 MRN: [id number]   Airway Documentation:    Vent end date: 11/14/20 Vent end time: 0915   Evaluation  O2 sats: stable throughout Complications: No apparent complications Patient did tolerate procedure well. Bilateral Breath Sounds: Clear   Yes  Patient was extubated to a 4L [place] without any complications, dyspnea or stridor noted. Patient was instructed on IS x 5, highest goal reached was 1250 mL. NIF: -35, VC: 1.3L, positive cuff leak prior to extubation.   Sophiea Ueda L 11/14/2020, 9:15 AM

## 2020-11-14 NOTE — Progress Notes (Addendum)
[facility] DAILY ICU PROGRESS NOTE                   [address]            [place] [address]          [phone]   1 Day Post-Op Procedure(s) (LRB): CORONARY ARTERY BYPASS GRAFTING (CABG) x 4 ON CARDIOPULMONARY BYPASS USING LIMA AND RIGHT GSV. -LIMA to LAD -SVG to OM -SVG to PDA -SVG to DIAGONAL (N/A) TRANSESOPHAGEAL ECHOCARDIOGRAM (TEE) (N/A) ENDOVEIN HARVEST OF GREATER SAPHENOUS VEIN (Right) APPLICATION OF CELL SAVER (N/A)  Total Length of Stay:  LOS: 8 days   Subjective: Remains intubated. Follows commands.  Objective: Vital signs in last 24 hours: Temp:  [97.7 F (36.5 C)-100.6 F (38.1 C)] 99.9 F (37.7 C) (10/05 0645) Pulse Rate:  [65-82] 67 (10/05 0645) Cardiac Rhythm: Normal sinus rhythm (10/05 0400) Resp:  [11-21] 13 (10/05 0645) BP: (58-112)/(46-60) 108/58 (10/05 0600) SpO2:  [78 %-100 %] 100 % (10/05 0645) Arterial Line BP: (65-143)/(35-64) 108/41 (10/05 0645) FiO2 (%):  [40 %-50 %] 50 % (10/05 0549) Weight:  [86.9 kg] 86.9 kg (10/05 0449)  Filed Weights   11/06/20 1819 11/14/20 0449  Weight: 81.6 kg 86.9 kg    Weight change:    Hemodynamic parameters for last 24 hours: CVP:  [1 mmHg-15 mmHg] 7 mmHg  Intake/Output from previous day: 10/04 0701 - 10/05 0700 In: 6128.1 [P.O.:30; I.V.:4589.5; Blood:520; IV [id number]:539.7] Out: 6734 [Urine:2335; Drains:26; Blood:900; Chest Tube:330]  Intake/Output this shift: No intake/output data recorded.  Current Meds: Scheduled Meds:  acetaminophen  1,000 mg Oral Q6H   Or   acetaminophen (TYLENOL) oral liquid 160 mg/5 mL  1,000 mg Per Tube Q6H   aspirin EC  325 mg Oral Daily   Or   aspirin  324 mg Per Tube Daily   bisacodyl  10 mg Oral Daily   Or   bisacodyl  10 mg Rectal Daily   chlorhexidine gluconate (MEDLINE KIT)  15 mL Mouth Rinse BID   Chlorhexidine Gluconate Cloth  6 each Topical Daily   docusate sodium  200 mg Oral Daily   ezetimibe  10 mg Oral Daily   feeding supplement  237 mL  Oral BID BM   mouth rinse  15 mL Mouth Rinse 10 times per day   metoprolol tartrate  12.5 mg Oral BID   Or   metoprolol tartrate  12.5 mg Per Tube BID   multivitamin with minerals  1 tablet Oral Daily   [START ON 11/15/2020] pantoprazole  40 mg Oral Daily   [START ON 11/15/2020] polyethylene glycol  17 g Oral Daily   rosuvastatin  40 mg Oral Daily   sertraline  100 mg Oral Daily   sodium chloride flush  3 mL Intravenous Q12H   tamsulosin  0.4 mg Oral QPC supper   Continuous Infusions:  sodium chloride 20 mL/hr at 11/14/20 0600   sodium chloride     sodium chloride     albumin human Stopped (11/14/20 0112)   albumin human      ceFAZolin (ANCEF) IV 2 g (11/14/20 0622)   dexmedetomidine (PRECEDEX) IV infusion Stopped (11/13/20 2203)   famotidine (PEPCID) IV Stopped (11/13/20 2227)   insulin 1.2 Units/hr (11/14/20 0600)   lactated ringers     lactated ringers     lactated ringers Stopped (11/13/20 2020)   phenylephrine (NEO-SYNEPHRINE) Adult infusion 55 mcg/min (11/14/20 0600)   PRN Meds:.sodium chloride, albumin human **AND** [COMPLETED]  sodium chloride, albumin human, cyclobenzaprine, dextrose, lactated ringers, levalbuterol, metoprolol tartrate, midazolam, morphine injection, ondansetron (ZOFRAN) IV, oxyCODONE, sodium chloride flush, traMADol  General appearance: alert, cooperative, and no distress Neurologic: intact Heart: RRR Lungs: clear to auscultation bilaterally Abdomen: Soft, non tender, sporadic bowel sounds Extremities: SCDs in place Wounds: Aquacel intact on sternal wound. RLE wound is clean and dry. JP has some sero sanguineous output  Lab Results: CBC: Recent Labs    11/13/20 1750 11/14/20 0404  WBC 11.4* 16.9*  HGB 10.8* 10.7*  HCT 33.9* 33.0*  PLT 122* 150   BMET:  Recent Labs    11/13/20 0216 11/13/20 1259 11/13/20 1642 11/14/20 0404  NA 137   < > 137 133*  K 4.6   < > 4.8 4.6  CL 100   < > 104 103  CO2 28  --   --  24  GLUCOSE 123*   < > 135*  129*  BUN 20   < > 17 18  CREATININE 0.90   < > 0.80 0.98  CALCIUM 9.3  --   --  8.0*   < > = values in this interval not displayed.    CMET: Lab Results  Component Value Date   WBC 16.9 (H) 11/14/2020   HGB 10.7 (L) 11/14/2020   HCT 33.0 (L) 11/14/2020   PLT 150 11/14/2020   GLUCOSE 129 (H) 11/14/2020   CHOL 132 11/06/2020   TRIG 143 11/06/2020   HDL 36 (L) 11/06/2020   LDLDIRECT 101 (H) 09/09/2019   LDLCALC 67 11/06/2020   ALT 30 11/05/2020   AST 26 11/05/2020   NA 133 (L) 11/14/2020   K 4.6 11/14/2020   CL 103 11/14/2020   CREATININE 0.98 11/14/2020   BUN 18 11/14/2020   CO2 24 11/14/2020   TSH 0.95 02/19/2015   PSA 1.82 02/19/2015   INR 1.3 (H) 11/13/2020   HGBA1C 6.2 (H) 11/12/2020   MICROALBUR 1.3 04/07/2016      PT/INR:  Recent Labs    11/13/20 1750  LABPROT 16.6*  INR 1.3*   Radiology: [facility] Chest Port 1 View  Result Date: 11/13/2020 CLINICAL DATA:  Evaluate for pneumothorax.  Status post CABG EXAM: PORTABLE CHEST 1 VIEW COMPARISON:  11/12/2020 FINDINGS: There is a right IJ catheter with tip in the projection of the SVC. The ET tube tip is above the carina. There is a nasogastric tube with tip and side port below the GE junction. Signs of recent CABG procedure with median sternotomy noted. Mediastinal drain and bilateral chest tubes are in place. No pneumothorax identified. Platelike atelectasis noted within the left midlung. No pleural effusion or signs of interstitial edema. IMPRESSION: 1. Status post CABG procedure. No pneumothorax. 2. Platelike atelectasis within the left midlung. Electronically Signed   By: Kerby Moors M.D.   On: 11/13/2020 19:00   ECHO INTRAOPERATIVE TEE  Result Date: 11/13/2020  *INTRAOPERATIVE TRANSESOPHAGEAL REPORT *  Patient Name:   Ricky Abbott Date of Exam: 11/13/2020 Medical Rec #:  [id number]         Height:       71.0 in Accession #:    [id number]        Weight:       179.8 lb Date of Birth:  September 01, 1945         BSA:           2.02 m Patient Age:    75 years          BP:  145/78 mmHg Patient Gender: M                 HR:           65 bpm. Exam Location:  Anesthesiology Transesophogeal exam was perform intraoperatively during surgical procedure. Patient was closely monitored under general anesthesia during the entirety of examination. Indications:     CAD Performing Phys: [id number] Lucile Crater Phuoc Huy Diagnosing Phys: Belenda Cruise Stoltzfus Complications: No known complications during this procedure. POST-OP IMPRESSIONS _ Left Ventricle: has mildly reduced systolic function. The cavity size was normal. The wall motion is normal. _ Right Ventricle: The right ventricle appears unchanged from pre-bypass. _ Aorta: The aorta appears unchanged from pre-bypass. _ Left Atrial Appendage: The left atrial appendage appears unchanged from pre-bypass. _ Aortic Valve: The aortic valve appears unchanged from pre-bypass. _ Mitral Valve: The mitral valve appears unchanged from pre-bypass. _ Tricuspid Valve: The tricuspid valve appears unchanged from pre-bypass. _ Interatrial Septum: The interatrial septum appears unchanged from pre-bypass. _ Pericardium: The pericardium appears unchanged from pre-bypass. 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 No new or worsening wall motion or valvular abnormalities. Mildly reduced EF 45-50% unchanged. PRE-OP FINDINGS  Left Ventricle: The left ventricle has mildly reduced systolic function, with an ejection fraction of 45-50%. The cavity size was normal. No evidence of left ventricular regional wall motion abnormalities. There is borderline left ventricular hypertrophy. Left ventricular diastolic parameters were normal. Right Ventricle: The right ventricle has normal systolic function. The cavity was normal. There is no increase in right ventricular wall thickness. Left Atrium: Left atrial size was normal in size. No left atrial/left atrial appendage thrombus was detected. Left atrial appendage  velocity is reduced at less than 40 cm/s. Right Atrium: Right atrial size was normal in size. Interatrial Septum: No atrial level shunt detected by color flow Doppler. The interatrial septum appears to be lipomatous. There is no evidence of a patent foramen ovale. Pericardium: Trivial pericardial effusion is present. The pericardial effusion is surrounding the apex. There is no pleural effusion. Mitral Valve: The mitral valve is normal in structure. Mitral valve regurgitation is mild by color flow Doppler. There is no evidence of mitral valve vegetation. There is No evidence of mitral stenosis. Tricuspid Valve: The tricuspid valve was normal in structure. Tricuspid valve regurgitation was not visualized by color flow Doppler. No evidence of tricuspid stenosis is present. There is no evidence of tricuspid valve vegetation. Aortic Valve: The aortic valve is tricuspid Aortic valve regurgitation was not visualized by color flow Doppler. There is no stenosis of the aortic valve, with a calculated valve area of 2.12 cm. There is no evidence of aortic valve vegetation. Pulmonic Valve: The pulmonic valve was normal in structure, with normal. No evidence of pumonic stenosis. Pulmonic valve regurgitation is not visualized by color flow Doppler. Aorta: The ascending aorta and aortic root are normal in size and structure. Pulmonary Artery: The pulmonary artery is of normal size. Venous: The inferior vena cava is normal in size with less than 50% respiratory variability, suggesting right atrial pressure of 8 mmHg. Shunts: There is no evidence of an atrial septal defect. +--------------+--------++ LEFT VENTRICLE         +----------------+---------++ +--------------+--------++ Diastology                PLAX 2D                +----------------+---------++ +--------------+--------++ LV e' lateral:  6.69 cm/s LVIDd:  5.12 cm  +----------------+---------++ +--------------+--------++ LV E/e' lateral:8.1        LVIDs:        3.89 cm  +----------------+---------++ +--------------+--------++ LV e' medial:   5.56 cm/s LV PW:        1.10 cm  +----------------+---------++ +--------------+--------++ LV E/e' medial: 9.8       LV IVS:       1.30 cm  +----------------+---------++ +--------------+--------++ LVOT diam:    2.10 cm  +----------------------+-------++ +--------------+--------++ 2D Longitudinal Strain        LV SV:        59 ml    +----------------------+-------++ +--------------+--------++ 2D Strain GLS (A2C):  -12.1 % LV SV Index:  29.29    +----------------------+-------++ +--------------+--------++ 2D Strain GLS (A3C):  -11.8 % LVOT Area:    3.46 cm +----------------------+-------++ +--------------+--------++ 2D Strain GLS (A4C):  -16.4 %                        +----------------------+-------++ +--------------+--------++ 2D Strain GLS Avg:    -13.4 %                            +----------------------+-------++                             +------------+--------++                            3D Volume EF                                    +------------+--------++                            LV 3D EDV:  91.00 ml                            +------------+--------++                            LV 3D ESV:  50.00 ml                            +------------+--------++ +------------------+-----------++ AORTIC VALVE                  +------------------+-----------++ AV Area (Vmax):   2.06 cm    +------------------+-----------++ AV Area (Vmean):  1.61 cm    +------------------+-----------++ AV Area (VTI):    2.12 cm    +------------------+-----------++ AV Vmax:          125.00 cm/s +------------------+-----------++ AV Vmean:         85.800 cm/s +------------------+-----------++ AV VTI:           0.310 m     +------------------+-----------++ AV Peak Grad:     6.2 mmHg    +------------------+-----------++ AV Mean Grad:      3.0 mmHg    +------------------+-----------++ LVOT Vmax:        74.50 cm/s  +------------------+-----------++ LVOT Vmean:       40.000 cm/s +------------------+-----------++ LVOT VTI:         0.190 m     +------------------+-----------++ LVOT/AV VTI ratio:0.61        +------------------+-----------++  +--------------+-------++  AORTA                 +--------------+-------++ Ao Sinus diam:3.50 cm +--------------+-------++ Ao STJ diam:  3.0 cm  +--------------+-------++ Ao Asc diam:  3.40 cm +--------------+-------++ +--------------+----------++ MITRAL VALVE             +--------------+-------+ +--------------+----------++ SHUNTS                MV Area (PHT):3.77 cm   +--------------+-------+ +--------------+----------++ Systemic VTI: 0.19 m  MV Peak grad: 1.6 mmHg   +--------------+-------+ +--------------+----------++ Systemic Diam:2.10 cm MV Mean grad: 1.0 mmHg   +--------------+-------+ +--------------+----------++ MV Vmax:      0.62 m/s   +--------------+----------++ MV Vmean:     35.8 cm/s  +--------------+----------++ MV PHT:       58.29 msec +--------------+----------++ MV Decel Time:201 msec   +--------------+----------++ +--------------+----------++ MV E velocity:54.40 cm/s +--------------+----------++ MV A velocity:61.00 cm/s +--------------+----------++ MV E/A ratio: 0.89       +--------------+----------++  Rochele Pages Electronically signed by Rochele Pages Signature Date/Time: 11/13/2020/5:15:02 PM    Final      Assessment/Plan: S/P Procedure(s) (LRB): CORONARY ARTERY BYPASS GRAFTING (CABG) x 4 ON CARDIOPULMONARY BYPASS USING LIMA AND RIGHT GSV. -LIMA to LAD -SVG to OM -SVG to PDA -SVG to DIAGONAL (N/A) TRANSESOPHAGEAL ECHOCARDIOGRAM (TEE) (N/A) ENDOVEIN HARVEST OF GREATER SAPHENOUS VEIN (Right) APPLICATION OF CELL SAVER (N/A)  CV-CO/CI 8.2/4.1. SR with HR in the 60's. On Neo Synephrine drip and  Lopressor 12.5 mg bid. Wean off Neo Synephrine drip.  Pulmonary-intubated. Wean to extubate and per Dr. Kipp Brood to bip pap. Chest tubes with 330 cc since surgery. CXR this am appears stable (left base atelectasis). Chest tubes to remain for now. Patient with a history of COPD and sarcoidosis.  Volume overload-diurese once off Neo Synephrine drip Expected post op blood loss anemia-H and H this am stable at 10.7 and 33 CBGs 132/126/157. Wean off Insulin drip. Pre op HGA1C 6.2. Borderline diabetes.Will stop accu checks and SS PRN after transfer from ICU 6. JP drain with 26 cc since surgery. Will remove soon. 7. Fever to 100.6. Likely related to SIRS. Tylenol PRN and monitor for now.   Donielle Liston Alba PA-C 11/14/2020 7:08 AM   Agree with above Extubated this am Remains on neo, will add midodrine Will add plavix Continue ICU care  [place]

## 2020-11-14 NOTE — Transfer of Care (Signed)
Immediate Anesthesia Transfer of Care Note  Patient: Ricky Abbott  Procedure(s) Performed: CORONARY ARTERY BYPASS GRAFTING (CABG) x 4 ON CARDIOPULMONARY BYPASS USING LIMA AND RIGHT GSV. -LIMA to LAD -SVG to OM -SVG to PDA -SVG to DIAGONAL (Chest) TRANSESOPHAGEAL ECHOCARDIOGRAM (TEE) ENDOVEIN HARVEST OF GREATER SAPHENOUS VEIN (Right) APPLICATION OF CELL SAVER  Patient Location: ICU  Anesthesia Type:General  Level of Consciousness: Patient remains intubated per anesthesia plan  Airway & Oxygen Therapy: Patient remains intubated per anesthesia plan and Patient placed on Ventilator (see vital sign flow sheet for setting)  Post-op Assessment: Report given to RN and Post -op Vital signs reviewed and stable  Post vital signs: Reviewed and stable  Last Vitals:  Vitals Value Taken Time  BP 118/58 11/13/20 1759  Temp 36.6 C 11/13/20 1759  Pulse 83 11/13/20 1759  Resp 12 11/13/20 1759  SpO2 100 % 11/13/20 1759  Vitals shown include unvalidated device data.  Last Pain:  Vitals:   11/14/20 0400  TempSrc: Core  PainSc:       Patients Stated Pain Goal: 0 (23/55/73 2202)  Complications: No notable events documented.

## 2020-11-14 NOTE — Discharge Summary (Signed)
Physician Discharge Summary       [place]       [place] [address]             [phone]    Patient ID: Ricky Abbott MRN: [id number] DOB/AGE: 07-13-1945 75 y.o.  Admit date: 11/06/2020 Discharge date: 11/20/2020  Admission Diagnoses: Unstable angina ([place]) 2. Coronary artery disease  Discharge Diagnoses:  S/p CABG x 4 Expected post op blood loss anemia 3.  Malnutrition of moderate degree 4. History of the following:  Allergy      Anxiety     Asthma     COPD (chronic obstructive pulmonary disease) ([place])     Depression     Diabetes mellitus without complication (HCC)     Dyspnea     GERD (gastroesophageal reflux disease)     History of chicken pox     Hyperlipidemia     Hypertension     MI (myocardial infarction) ([place])     Prostate enlargement     PTSD (post-traumatic stress disorder)      [place] Vet   Sarcoidosis of lung ([place]      Consults: None  Procedure (s):  CABG X 4.  LIMA to LAD, reverse saphenous vein graft to PDA, reverse saphenous vein graft to OM1, reverse saphenous vein graft to the first diagonal. Endoscopic greater saphenous vein harvest on the right by Dr. Kipp Brood on 11/13/2020.   History of Present Illness:  Mr. [name] is a 75 year old gentleman with past history of coronary artery disease having PTCI of the right coronary and LAD in 2014.  He has been on Plavix since that time.  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  He describes having worsening exertional angina for about 1 year but developed an intense episode in January while at rest on 11/05/2020.  He took some old nitroglycerin tablets he had at home without effect.  EMS was summoned and he was given additional sublingual nitroglycerin without improvement in his pain.  He was taken to the [facility]  regional ED.  Work-up in the emergency room included an EKG that showed sinus rhythm with right bundle branch block.  Serial high-sensitivity troponin levels were 5 and 9.  Chest x-ray showed no acute process.  He was started on a heparin infusion treated with nitroglycerin paste.  His pain resolved.  He was admitted to the hospital and cardiology was consulted.  Left heart catheterization was carried out yesterday demonstrating severe three-vessel coronary artery disease.  Left ventricular ejection fraction was 55 to 60%.  The left atrium was mildly dilated the right ventricle demonstrated normal size and systolic function.   Mr. [name] was transferred to [facility] for further evaluation and CT surgery consultation for coronary bypass grafting.  Hospital Course:  Mr. [name] continued to have episodes of "stuttering" chest pain which relieved with NTG.  He was evaluated by Dr. Kipp Brood who was in agreement the patient would benefit from coronary artery bypass grafting.  The risks and benefits of the procedure were explained to the patient and he was agreeable to proceed.  He was taken to the operating room on 11/13/2020.  He underwent CABG x 10/4 utilizing LIMA to LAD -SVG to OM -SVG to PDA -SVG to DIAGONAL.  He also underwent endoscopic harvest of greater saphenous vein.  He tolerated the  procedure without difficulty and was taken to the SICU in stable condition.   The patient was somewhat difficult to extubate using standard protocols but this was accomplished over time.  He has remained hemodynamically stable but has required Neo-Synephrine early for blood pressure support.  This was able to be weaned over time.  He was given extra volume as well as started on midodrine.  He does have some postoperative expected volume overloaded has been started on a course of diuretics.  He does have expected acute blood loss anemia which is being monitored clinically.  He has also been started on  iron.  Chest tubes were removed on postoperative day #2.  He is started on a course of routine postoperative pulmonary toilet and cardiac rehabilitation. He did have some hypotension therefore he was started on midodrine with appropriate response.  The midodrine dosing was gradually decreased as his blood pressure improved.  He was started on low-dose metoprolol and tolerated this well.  His mobility gradually improved so that by the time of discharge he was completely independent with ambulation and transfers. Since Mr. Geimer lives alone, the recommendations of the physical therapy and Occupational Therapy teams were for him to transition to a skilled nursing facility for further rehab before attempting to return to independent living.  Care management team was involved to assist with locating a suitable facility with bed availability.  Mr. Scaife refused SNF placement.  Due to this home health PT was arranged.  He remained hemodynamically stable in NSR.  His surgical incisions are healing without evidence of infection.  He is medically stable for discharge home today.   Latest Vital Signs: Blood pressure 133/62, pulse 87, temperature 97.8 F (36.6 C), temperature source Oral, resp. rate 19, height 5\' 11"  (1.803 m), weight 82 kg, SpO2 97 %.  Physical Exam:  General appearance: alert, cooperative, and no distress Heart: regular rate and rhythm Lungs: clear to auscultation bilaterally Abdomen: soft, non-tender; bowel sounds normal; no masses,  no organomegaly Extremities: edema trace Wound: clean and dry  Discharge Condition:Stable and discharged to home  Recent laboratory studies:  Lab Results  Component Value Date   WBC 6.9 11/19/2020   HGB 8.7 (L) 11/19/2020   HCT 27.7 (L) 11/19/2020   MCV 87.4 11/19/2020   PLT 224 11/19/2020   Lab Results  Component Value Date   NA 137 11/19/2020   K 5.0 11/19/2020   CL 102 11/19/2020   CO2 27 11/19/2020   CREATININE 0.95 11/19/2020   GLUCOSE  165 (H) 11/19/2020      Diagnostic Studies: DG Chest 2 View  Result Date: 11/18/2020 CLINICAL DATA:  Cough, chest pain, shortness of breath EXAM: CHEST - 2 VIEW COMPARISON:  11/15/2020 FINDINGS: Upper normal heart size post CABG. Mediastinal contours and pulmonary vascularity normal. Small bibasilar pleural effusions and accompanying atelectasis. Remaining lungs clear. No infiltrate or pneumothorax. Mild scattered endplate spur formation thoracic spine. IMPRESSION: Small bibasilar pleural effusions and atelectasis. Post CABG. Electronically Signed   By: Lavonia Dana M.D.   On: 11/18/2020 12:47   DG Chest 2 View  Result Date: 11/12/2020 CLINICAL DATA:  Preoperative evaluation for CABG EXAM: CHEST - 2 VIEW COMPARISON:  10/16/2016 FINDINGS: Normal heart size, mediastinal contours, and pulmonary vascularity. Lungs clear. No pulmonary infiltrate, pleural effusion, or pneumothorax. Minor endplate spur formation thoracic spine. IMPRESSION: No acute abnormalities. Electronically Signed   By: Lavonia Dana M.D.   On: 11/12/2020 08:05   CARDIAC CATHETERIZATION  Result Date: 11/06/2020 Conclusions:  Severe three-vessel coronary artery disease, as detailed below. Low normal left ventricular systolic function (LVEF 82-42%) with mid and apical inferior hypokinesis.  Mildly elevated left ventricular filling pressure (LVEDP 20-25 mmHg). Recommendations: Transfer to [facility] for cardiac surgery consultation. Hold clopidogrel pending surgical consultation. Restart IV heparin 2 hours after TR band removal. Aggressive secondary prevention. Nelva Bush, MD [facility]  DG Chest Port 1 View  Result Date: 11/15/2020 CLINICAL DATA:  Evaluate chest tube EXAM: PORTABLE CHEST 1 VIEW COMPARISON:  Chest x-ray dated November 14, 2020 FINDINGS: Interval extubation and removal of NG tube. Remaining support devices are stable including right IJ line bilateral chest tubes and a mediastinal drain. Decreased lung volumes with new  bibasilar opacities. Possible small bilateral pleural effusions. No large pleural effusion or pneumothorax. Cardiac and mediastinal contours are unchanged post median sternotomy. IMPRESSION: Interval extubation and removal of NG tube. Remaining support devices are stable. New bibasilar opacities, likely due to atelectasis. Electronically Signed   By: Yetta Glassman M.D.   On: 11/15/2020 08:38   DG Chest Port 1 View  Result Date: 11/14/2020 CLINICAL DATA:  Status post open heart surgery EXAM: PORTABLE CHEST 1 VIEW COMPARISON:  11/13/2020 FINDINGS: Bibasilar atelectasis. Bilateral chest tubes in satisfactory position. Nasogastric tube coursing below the diaphragm. Endotracheal tube 4.8 cm above the carina. Right-sided central venous catheter projecting over the SVC. No pleural effusion or pneumothorax. Heart and mediastinal contours are unremarkable. CABG. No acute osseous abnormality. IMPRESSION: 1. Status post CABG. No pneumothorax. Support lines and tubing in satisfactory position. Electronically Signed   By: Kathreen Devoid M.D.   On: 11/14/2020 08:51   DG Chest Port 1 View  Result Date: 11/13/2020 CLINICAL DATA:  Evaluate for pneumothorax.  Status post CABG EXAM: PORTABLE CHEST 1 VIEW COMPARISON:  11/12/2020 FINDINGS: There is a right IJ catheter with tip in the projection of the SVC. The ET tube tip is above the carina. There is a nasogastric tube with tip and side port below the GE junction. Signs of recent CABG procedure with median sternotomy noted. Mediastinal drain and bilateral chest tubes are in place. No pneumothorax identified. Platelike atelectasis noted within the left midlung. No pleural effusion or signs of interstitial edema. IMPRESSION: 1. Status post CABG procedure. No pneumothorax. 2. Platelike atelectasis within the left midlung. Electronically Signed   By: Kerby Moors M.D.   On: 11/13/2020 19:00   DG ABD ACUTE 2+V W 1V CHEST  Result Date: 11/05/2020 CLINICAL DATA:  Substernal  chest pain EXAM: DG ABDOMEN ACUTE WITH 1 VIEW CHEST COMPARISON:  None. FINDINGS: There is no evidence of dilated bowel loops or free intraperitoneal air. Cholecystectomy clips are noted within the right upper quadrant. No radiopaque calculi or other significant radiographic abnormality is seen. Heart size and mediastinal contours are within normal limits. Both lungs are clear. IMPRESSION: Negative abdominal radiographs.  No acute cardiopulmonary disease. Electronically Signed   By: Fidela Salisbury M.D.   On: 11/05/2020 22:49   ECHOCARDIOGRAM COMPLETE  Result Date: 11/07/2020    ECHOCARDIOGRAM REPORT   Patient Name:   Ricky Abbott Date of Exam: 11/07/2020 Medical Rec #:  [id number]         Height:       71.0 in Accession #:    [id number]        Weight:       179.8 lb Date of Birth:  10/23/1945         BSA:  2.015 m Patient Age:    76 years          BP:           133/72 mmHg Patient Gender: M                 HR:           72 bpm. Exam Location:  Inpatient Procedure: 2D Echo, Color Doppler and Cardiac Doppler Indications:    NSTEMI  History:        Patient has no prior history of Echocardiogram examinations.                 Risk Factors:Dyslipidemia.  Sonographer:    Raquel Sarna Senior RDCS Referring Phys: [place]  1. Distal septal and inferior hypokinesis . Left ventricular ejection fraction, by estimation, is 45 to 50%. The left ventricle has mildly decreased function. The left ventricle demonstrates regional wall motion abnormalities (see scoring diagram/findings for description). There is mild left ventricular hypertrophy. Left ventricular diastolic parameters were normal.  2. Right ventricular systolic function is normal. The right ventricular size is normal.  3. The mitral valve is abnormal. Trivial mitral valve regurgitation. No evidence of mitral stenosis.  4. The aortic valve is tricuspid. Aortic valve regurgitation is not visualized. Mild to moderate aortic valve  sclerosis/calcification is present, without any evidence of aortic stenosis.  5. The inferior vena cava is normal in size with greater than 50% respiratory variability, suggesting right atrial pressure of 3 mmHg. FINDINGS  Left Ventricle: Distal septal and inferior hypokinesis. Left ventricular ejection fraction, by estimation, is 45 to 50%. The left ventricle has mildly decreased function. The left ventricle demonstrates regional wall motion abnormalities. The left ventricular internal cavity size was normal in size. There is mild left ventricular hypertrophy. Left ventricular diastolic parameters were normal. Right Ventricle: The right ventricular size is normal. No increase in right ventricular wall thickness. Right ventricular systolic function is normal. Left Atrium: Left atrial size was normal in size. Right Atrium: Right atrial size was normal in size. Pericardium: There is no evidence of pericardial effusion. Mitral Valve: The mitral valve is abnormal. There is mild thickening of the mitral valve leaflet(s). There is mild calcification of the mitral valve leaflet(s). Mild mitral annular calcification. Trivial mitral valve regurgitation. No evidence of mitral valve stenosis. Tricuspid Valve: The tricuspid valve is normal in structure. Tricuspid valve regurgitation is not demonstrated. No evidence of tricuspid stenosis. Aortic Valve: The aortic valve is tricuspid. Aortic valve regurgitation is not visualized. Mild to moderate aortic valve sclerosis/calcification is present, without any evidence of aortic stenosis. Pulmonic Valve: The pulmonic valve was normal in structure. Pulmonic valve regurgitation is not visualized. No evidence of pulmonic stenosis. Aorta: The aortic root is normal in size and structure. Venous: The inferior vena cava is normal in size with greater than 50% respiratory variability, suggesting right atrial pressure of 3 mmHg. IAS/Shunts: No atrial level shunt detected by color flow Doppler.   LEFT VENTRICLE PLAX 2D LVIDd:         4.60 cm  Diastology LVIDs:         3.60 cm  LV e' medial:    4.90 cm/s LV PW:         1.20 cm  LV E/e' medial:  17.4 LV IVS:        1.20 cm  LV e' lateral:   8.81 cm/s LVOT diam:     2.00 cm  LV E/e' lateral:  9.7 LV SV:         64 LV SV Index:   32 LVOT Area:     3.14 cm  RIGHT VENTRICLE RV S prime:     12.30 cm/s TAPSE (M-mode): 2.3 cm LEFT ATRIUM             Index       RIGHT ATRIUM           Index LA diam:        3.10 cm 1.54 cm/m  RA Area:     15.60 cm LA Vol (A2C):   63.6 ml 31.56 ml/m RA Volume:   37.50 ml  18.61 ml/m LA Vol (A4C):   59.4 ml 29.47 ml/m LA Biplane Vol: 62.4 ml 30.96 ml/m  AORTIC VALVE LVOT Vmax:   90.40 cm/s LVOT Vmean:  66.300 cm/s LVOT VTI:    0.205 m  AORTA Ao Root diam: 3.40 cm Ao Asc diam:  3.40 cm MITRAL VALVE MV Area (PHT): 3.39 cm     SHUNTS MV Decel Time: 224 msec     Systemic VTI:  0.20 m MV E velocity: 85.10 cm/s   Systemic Diam: 2.00 cm MV A velocity: 113.00 cm/s MV E/A ratio:  0.75 Jenkins Rouge MD Electronically signed by Jenkins Rouge MD Signature Date/Time: 11/07/2020/10:19:01 AM    Final    ECHO INTRAOPERATIVE TEE  Result Date: 11/13/2020  *INTRAOPERATIVE TRANSESOPHAGEAL REPORT *  Patient Name:   GERHARDT GLEED Date of Exam: 11/13/2020 Medical Rec #:  [id number]         Height:       71.0 in Accession #:    [id number]        Weight:       179.8 lb Date of Birth:  [birth year]         BSA:          2.02 m Patient Age:    75 years          BP:           145/78 mmHg Patient Gender: M                 HR:           65 bpm. Exam Location:  Anesthesiology Transesophogeal exam was perform intraoperatively during surgical procedure. Patient was closely monitored under general anesthesia during the entirety of examination. Indications:     CAD Performing Phys: [id number] Lucile Crater LIGHTFOOT Diagnosing Phys: Belenda Cruise Stoltzfus Complications: No known complications during this procedure. POST-OP IMPRESSIONS _ Left Ventricle: has mildly reduced  systolic function. The cavity size was normal. The wall motion is normal. _ Right Ventricle: The right ventricle appears unchanged from pre-bypass. _ Aorta: The aorta appears unchanged from pre-bypass. _ Left Atrial Appendage: The left atrial appendage appears unchanged from pre-bypass. _ Aortic Valve: The aortic valve appears unchanged from pre-bypass. _ Mitral Valve: The mitral valve appears unchanged from pre-bypass. _ Tricuspid Valve: The tricuspid valve appears unchanged from pre-bypass. _ Interatrial Septum: The interatrial septum appears unchanged from pre-bypass. _ Pericardium: The pericardium appears unchanged from pre-bypass. 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 No new or worsening wall motion or valvular abnormalities. Mildly reduced EF 45-50% unchanged. PRE-OP FINDINGS  Left Ventricle: The left ventricle has mildly reduced systolic function, with an ejection fraction of 45-50%. The cavity size was normal. No evidence of left ventricular regional wall motion abnormalities. There is borderline left ventricular hypertrophy. Left ventricular diastolic parameters  were normal. Right Ventricle: The right ventricle has normal systolic function. The cavity was normal. There is no increase in right ventricular wall thickness. Left Atrium: Left atrial size was normal in size. No left atrial/left atrial appendage thrombus was detected. Left atrial appendage velocity is reduced at less than 40 cm/s. Right Atrium: Right atrial size was normal in size. Interatrial Septum: No atrial level shunt detected by color flow Doppler. The interatrial septum appears to be lipomatous. There is no evidence of a patent foramen ovale. Pericardium: Trivial pericardial effusion is present. The pericardial effusion is surrounding the apex. There is no pleural effusion. Mitral Valve: The mitral valve is normal in structure. Mitral valve regurgitation is mild by color flow Doppler. There is no evidence of mitral valve  vegetation. There is No evidence of mitral stenosis. Tricuspid Valve: The tricuspid valve was normal in structure. Tricuspid valve regurgitation was not visualized by color flow Doppler. No evidence of tricuspid stenosis is present. There is no evidence of tricuspid valve vegetation. Aortic Valve: The aortic valve is tricuspid Aortic valve regurgitation was not visualized by color flow Doppler. There is no stenosis of the aortic valve, with a calculated valve area of 2.12 cm. There is no evidence of aortic valve vegetation. Pulmonic Valve: The pulmonic valve was normal in structure, with normal. No evidence of pumonic stenosis. Pulmonic valve regurgitation is not visualized by color flow Doppler. Aorta: The ascending aorta and aortic root are normal in size and structure. Pulmonary Artery: The pulmonary artery is of normal size. Venous: The inferior vena cava is normal in size with less than 50% respiratory variability, suggesting right atrial pressure of 8 mmHg. Shunts: There is no evidence of an atrial septal defect. +--------------+--------++ LEFT VENTRICLE         +----------------+---------++ +--------------+--------++ Diastology                PLAX 2D                +----------------+---------++ +--------------+--------++ LV e' lateral:  6.69 cm/s LVIDd:        5.12 cm  +----------------+---------++ +--------------+--------++ LV E/e' lateral:8.1       LVIDs:        3.89 cm  +----------------+---------++ +--------------+--------++ LV e' medial:   5.56 cm/s LV PW:        1.10 cm  +----------------+---------++ +--------------+--------++ LV E/e' medial: 9.8       LV IVS:       1.30 cm  +----------------+---------++ +--------------+--------++ LVOT diam:    2.10 cm  +----------------------+-------++ +--------------+--------++ 2D Longitudinal Strain        LV SV:        59 ml    +----------------------+-------++ +--------------+--------++ 2D Strain GLS (A2C):   -12.1 % LV SV Index:  29.29    +----------------------+-------++ +--------------+--------++ 2D Strain GLS (A3C):  -11.8 % LVOT Area:    3.46 cm +----------------------+-------++ +--------------+--------++ 2D Strain GLS (A4C):  -16.4 %                        +----------------------+-------++ +--------------+--------++ 2D Strain GLS Avg:    -13.4 %                            +----------------------+-------++                             +------------+--------++  3D Volume EF                                    +------------+--------++                            LV 3D EDV:  91.00 ml                            +------------+--------++                            LV 3D ESV:  50.00 ml                            +------------+--------++ +------------------+-----------++ AORTIC VALVE                  +------------------+-----------++ AV Area (Vmax):   2.06 cm    +------------------+-----------++ AV Area (Vmean):  1.61 cm    +------------------+-----------++ AV Area (VTI):    2.12 cm    +------------------+-----------++ AV Vmax:          125.00 cm/s +------------------+-----------++ AV Vmean:         85.800 cm/s +------------------+-----------++ AV VTI:           0.310 m     +------------------+-----------++ AV Peak Grad:     6.2 mmHg    +------------------+-----------++ AV Mean Grad:     3.0 mmHg    +------------------+-----------++ LVOT Vmax:        74.50 cm/s  +------------------+-----------++ LVOT Vmean:       40.000 cm/s +------------------+-----------++ LVOT VTI:         0.190 m     +------------------+-----------++ LVOT/AV VTI ratio:0.61        +------------------+-----------++  +--------------+-------++ AORTA                 +--------------+-------++ Ao Sinus diam:3.50 cm +--------------+-------++ Ao STJ diam:  3.0 cm  +--------------+-------++ Ao Asc diam:  3.40 cm  +--------------+-------++ +--------------+----------++ MITRAL VALVE             +--------------+-------+ +--------------+----------++ SHUNTS                MV Area (PHT):3.77 cm   +--------------+-------+ +--------------+----------++ Systemic VTI: 0.19 m  MV Peak grad: 1.6 mmHg   +--------------+-------+ +--------------+----------++ Systemic Diam:2.10 cm MV Mean grad: 1.0 mmHg   +--------------+-------+ +--------------+----------++ MV Vmax:      0.62 m/s   +--------------+----------++ MV Vmean:     35.8 cm/s  +--------------+----------++ MV PHT:       58.29 msec +--------------+----------++ MV Decel Time:201 msec   +--------------+----------++ +--------------+----------++ MV E velocity:54.40 cm/s +--------------+----------++ MV A velocity:61.00 cm/s +--------------+----------++ MV E/A ratio: 0.89       +--------------+----------++  Rochele Pages Electronically signed by Rochele Pages Signature Date/Time: 11/13/2020/5:15:02 PM    Final    VAS US DOPPLER PRE CABG  Result Date: 11/11/2020 PREOPERATIVE VASCULAR EVALUATION Patient Name:  NEO YEPIZ  Date of Exam:   11/09/2020 Medical Rec #: [id number]          Accession #:    [id number] Date of Birth: August 22, 1945          Patient Gender: M Patient Age:   43 years Exam Location:  [facility]  Procedure:      VAS US DOPPLER PRE CABG Referring Phys: HARRELL LIGHTFOOT --------------------------------------------------------------------------------  Indications:      Pre-CABG. Risk Factors:     Hypertension, hyperlipidemia, Diabetes. Comparison Study: No prior studies. Performing Technologist: Carlos Levering RVT  Examination Guidelines: A complete evaluation includes B-mode imaging, spectral Doppler, color Doppler, and power Doppler as needed of all accessible portions of each vessel. Bilateral testing is considered an integral part of a complete examination. Limited examinations for reoccurring  indications may be performed as noted.  Right Carotid Findings: +----------+--------+--------+--------+-----------------------+--------+           PSV cm/sEDV cm/sStenosisDescribe               Comments +----------+--------+--------+--------+-----------------------+--------+ CCA Prox  51      11              smooth and heterogenous         +----------+--------+--------+--------+-----------------------+--------+ CCA Distal57      14              smooth and heterogenous         +----------+--------+--------+--------+-----------------------+--------+ ICA Prox  76      24              smooth and heterogenous         +----------+--------+--------+--------+-----------------------+--------+ ICA Distal52      15                                     tortuous +----------+--------+--------+--------+-----------------------+--------+ ECA       168     22                                              +----------+--------+--------+--------+-----------------------+--------+ +----------+--------+-------+--------+------------+           PSV cm/sEDV cmsDescribeArm Pressure +----------+--------+-------+--------+------------+ Subclavian122                                 +----------+--------+-------+--------+------------+ +---------+--------+--+--------+-+---------+ VertebralPSV cm/s28EDV cm/s9Antegrade +---------+--------+--+--------+-+---------+ Left Carotid Findings: +----------+--------+--------+--------+-----------------------+--------+           PSV cm/sEDV cm/sStenosisDescribe               Comments +----------+--------+--------+--------+-----------------------+--------+ CCA Prox  66      16              smooth and heterogenous         +----------+--------+--------+--------+-----------------------+--------+ CCA Distal78      17              smooth and heterogenous         +----------+--------+--------+--------+-----------------------+--------+ ICA  Prox  82      22              smooth and heterogenous         +----------+--------+--------+--------+-----------------------+--------+ ICA Distal51      20                                     tortuous +----------+--------+--------+--------+-----------------------+--------+ ECA       118     11                                              +----------+--------+--------+--------+-----------------------+--------+ +----------+--------+--------+--------+------------+  SubclavianPSV cm/sEDV cm/sDescribeArm Pressure +----------+--------+--------+--------+------------+           125                                  +----------+--------+--------+--------+------------+ +---------+--------+--+--------+--+---------+ VertebralPSV cm/s31EDV cm/s10Antegrade +---------+--------+--+--------+--+---------+  ABI Findings: +--------+------------------+-----+----------+--------+ Right   Rt Pressure (mmHg)IndexWaveform  Comment  +--------+------------------+-----+----------+--------+ [id number]                    triphasic          +--------+------------------+-----+----------+--------+ PTA     118               1.03 biphasic           +--------+------------------+-----+----------+--------+ DP      108               0.94 monophasic         +--------+------------------+-----+----------+--------+ +--------+------------------+-----+---------+-------+ Left    Lt Pressure (mmHg)IndexWaveform Comment +--------+------------------+-----+---------+-------+ [id number]                    triphasic        +--------+------------------+-----+---------+-------+ PTA     119               1.03 triphasic        +--------+------------------+-----+---------+-------+ DP      124               1.08 triphasic        +--------+------------------+-----+---------+-------+ +-------+---------------+----------------+ ABI/TBIToday's ABI/TBIPrevious ABI/TBI  +-------+---------------+----------------+ Right  1.03                            +-------+---------------+----------------+ Left   1.08                            +-------+---------------+----------------+  Right Doppler Findings: +--------+--------+-----+---------+--------+ Site    PressureIndexDoppler  Comments +--------+--------+-----+---------+--------+ [id number]          triphasic         +--------+--------+-----+---------+--------+ Radial               triphasic         +--------+--------+-----+---------+--------+ Ulnar                triphasic         +--------+--------+-----+---------+--------+  Left Doppler Findings: +--------+--------+-----+---------+--------+ Site    PressureIndexDoppler  Comments +--------+--------+-----+---------+--------+ [id number]          triphasic         +--------+--------+-----+---------+--------+ Radial               triphasic         +--------+--------+-----+---------+--------+ Ulnar                triphasic         +--------+--------+-----+---------+--------+  Summary: Right Carotid: Velocities in the right ICA are consistent with a 1-39% stenosis. Left Carotid: Velocities in the left ICA are consistent with a 1-39% stenosis. Vertebrals: Bilateral vertebral arteries demonstrate antegrade flow. Right ABI: Resting right ankle-brachial index is within normal range. No evidence of significant right lower extremity arterial disease. Left ABI: Resting left ankle-brachial index is within normal range. No evidence of significant left lower extremity arterial disease. Right Upper Extremity: Doppler waveform obliterate with right radial compression. Doppler waveform obliterate with right ulnar compression. Left Upper Extremity: Doppler waveform obliterate with left  radial compression. Doppler waveforms remain within normal limits with left ulnar compression.  Electronically signed by Harold Barban MD on 11/11/2020 at 9:29:57 AM.     Final    VAS [place] LOWER EXTREMITY VENOUS (DVT)  Result Date: 11/11/2020  Lower Venous DVT Study Patient Name:  KOKI BUXTON  Date of Exam:   11/11/2020 Medical Rec #: [id number]          Accession #:    [id number] Date of Birth: 1945/12/16          Patient Gender: M Patient Age:   21 years Exam Location:  [facility] Procedure:      VAS [place] LOWER EXTREMITY VENOUS (DVT) Referring Phys: Minus Breeding --------------------------------------------------------------------------------  Indications: Pain, and Calf cramping.  Comparison Study: No prior study Performing Technologist: Sharion Dove RVS  Examination Guidelines: A complete evaluation includes B-mode imaging, spectral Doppler, color Doppler, and power Doppler as needed of all accessible portions of each vessel. Bilateral testing is considered an integral part of a complete examination. Limited examinations for reoccurring indications may be performed as noted. The reflux portion of the exam is performed with the patient in reverse Trendelenburg.  +---------+---------------+---------+-----------+----------+--------------+ RIGHT    CompressibilityPhasicitySpontaneityPropertiesThrombus Aging +---------+---------------+---------+-----------+----------+--------------+ CFV      Full           Yes      Yes                                 +---------+---------------+---------+-----------+----------+--------------+ SFJ      Full                                                        +---------+---------------+---------+-----------+----------+--------------+ FV Prox  Full                                                        +---------+---------------+---------+-----------+----------+--------------+ FV Mid   Full                                                        +---------+---------------+---------+-----------+----------+--------------+ FV DistalFull                                                         +---------+---------------+---------+-----------+----------+--------------+ PFV      Full                                                        +---------+---------------+---------+-----------+----------+--------------+ POP      Full           Yes                                          +---------+---------------+---------+-----------+----------+--------------+  PTV      Full                                                        +---------+---------------+---------+-----------+----------+--------------+ PERO     Full                                                        +---------+---------------+---------+-----------+----------+--------------+ Soleal   Full                                                        +---------+---------------+---------+-----------+----------+--------------+ Gastroc  Full                                                        +---------+---------------+---------+-----------+----------+--------------+ GSV      Full                                                        +---------+---------------+---------+-----------+----------+--------------+   +----+---------------+---------+-----------+----------+--------------+ LEFTCompressibilityPhasicitySpontaneityPropertiesThrombus Aging +----+---------------+---------+-----------+----------+--------------+ CFV Full           Yes      Yes                                 +----+---------------+---------+-----------+----------+--------------+     Summary: RIGHT: - No evidence of deep vein thrombosis in the lower extremity. No indirect evidence of obstruction proximal to the inguinal ligament.  LEFT: - No evidence of common femoral vein obstruction.  *See table(s) above for measurements and observations. Electronically signed by Harold Barban MD on 11/11/2020 at 4:05:25 PM.    Final        Discharge Instructions     Amb Referral to Cardiac Rehabilitation   Complete by: As  directed    Diagnosis: CABG   CABG X ___: 4   After initial evaluation and assessments completed: Virtual Based Care may be provided alone or in conjunction with Phase 2 Cardiac Rehab based on patient barriers.: Yes       Discharge Medications: Allergies as of 11/20/2020       Reactions   Peanut-containing Drug Products Swelling   Swelling, rash, (Peanuts)        Medication List     STOP taking these medications    carvedilol 12.5 MG tablet Commonly known as: COREG   hydrALAZINE 20 MG/ML injection Commonly known as: APRESOLINE   isosorbide mononitrate 30 MG 24 hr tablet Commonly known as: IMDUR   labetalol 5 MG/ML injection Commonly known as: NORMODYNE   morphine 2 MG/ML injection   senna-docusate 8.6-50 MG tablet Commonly known as: Senokot-S   sodium phosphate  7-19 GM/118ML Enem       TAKE these medications    acetaminophen 325 MG tablet Commonly known as: TYLENOL Take 2 tablets (650 mg total) by mouth every 4 (four) hours as needed for headache or mild pain.   albuterol (2.5 MG/3ML) 0.083% nebulizer solution Commonly known as: PROVENTIL Inhale 3 mLs (2.5 mg total) into the lungs every 4 (four) hours as needed for wheezing or shortness of breath. What changed: Another medication with the same name was removed. Continue taking this medication, and follow the directions you see here.   aspirin 81 MG EC tablet Take 1 tablet (81 mg total) by mouth daily. Swallow whole. What changed: Another medication with the same name was removed. Continue taking this medication, and follow the directions you see here.   clopidogrel 75 MG tablet Commonly known as: PLAVIX Take 1 tablet (75 mg total) by mouth daily.   ezetimibe 10 MG tablet Commonly known as: ZETIA Take 1 tablet (10 mg total) by mouth daily.   guaiFENesin 600 MG 12 hr tablet Commonly known as: MUCINEX Take 1 tablet (600 mg total) by mouth 2 (two) times daily. May take if needed for respiratory  congestion   metoprolol tartrate 25 MG tablet Commonly known as: LOPRESSOR Take 0.5 tablets (12.5 mg total) by mouth 2 (two) times daily.   midodrine 2.5 MG tablet Commonly known as: PROAMATINE Take 1 tablet (2.5 mg total) by mouth 3 (three) times daily with meals. X 7 days, then decrease to 2 times per day   nitroGLYCERIN 0.4 MG SL tablet Commonly known as: NITROSTAT Place 1 tablet (0.4 mg total) under the tongue every 5 (five) minutes as needed for chest pain.   pantoprazole 40 MG tablet Commonly known as: PROTONIX Take 1 tablet (40 mg total) by mouth daily.   polyethylene glycol 17 g packet Commonly known as: MIRALAX / GLYCOLAX Take 17 g by mouth daily.   rosuvastatin 40 MG tablet Commonly known as: CRESTOR Take 1 tablet (40 mg total) by mouth daily.   sertraline 100 MG tablet Commonly known as: ZOLOFT Take 1 tablet (100 mg total) by mouth daily.   tamsulosin 0.4 MG Caps capsule Commonly known as: FLOMAX Take 1 capsule (0.4 mg total) by mouth daily.   traMADol 50 MG tablet Commonly known as: ULTRAM Take 1 tablet (50 mg total) by mouth every 6 (six) hours as needed for moderate pain.   zolpidem 5 MG tablet Commonly known as: AMBIEN Take 1 tablet (5 mg total) by mouth at bedtime as needed for sleep.               Durable Medical Equipment  (From admission, onward)           Start     Ordered   11/20/20 0732  For home use only DME 4 wheeled rolling walker with seat  Once       Question:  Patient needs a walker to treat with the following condition  Answer:  S/P CABG (coronary artery bypass graft)   11/20/20 0731   11/19/20 1213  For home use only DME 3 n 1  Once        11/19/20 1213   11/19/20 1213  For home use only DME Walker rolling  Once       Question Answer Comment  Walker: With [place]   Patient needs a walker to treat with the following condition Imbalance      11/19/20 1213   11/19/20  1213  For home use only DME Shower stool  Once         11/19/20 1213          The patient has been discharged on:   1.Beta Blocker:  Yes [ x  ]                              No   [   ]                              If No, reason:  2.Ace Inhibitor/ARB: Yes [   ]                                     No  [ x   ]                                     If No, reason: on midodrine for hypotension  3.Statin:   Yes [ x  ]                  No  [   ]                  If No, reason:  4.Ecasa:  Yes  [ x  ]                  No   [   ]                  If No, reason:  Patient had ACS upon admission:  Plavix/P2Y12 inhibitor: Yes [ x  ]                                      No  [   ]   Follow Up Appointments:  Follow-up Information     Lajuana Matte, MD Follow up on 11/30/2020.   Specialty: Cardiothoracic Surgery Why: Appointment is VIRTUAL. Please do NOT go to the office. Dr. Kipp Brood will call you at 2:50 pm Contact information: [address] [place] [address] [phone]         Rise Mu, PA-C. Go on 12/05/2020.   Specialties: Physician Assistant, Cardiology, Radiology Why: Appointment time is at 3:00 pm Contact information: [address] [place] [address] [phone]         Revelo, Elyse Jarvis, MD. Call today.   Specialty: Family Medicine Contact information: [address] [place] [address] [phone]         Minna Merritts, MD .   Specialty: Cardiology Contact information: [place] [address] [phone]         Theotis Burrow, MD. Call today.   Specialty: Family Medicine Contact information: [address] [place] [address] [phone]         Minna Merritts, MD .   Specialty: Cardiology Contact information: [place]  [address] [phone]  SignedEllamae Sia 11/20/2020, 8:17 AM

## 2020-11-14 NOTE — TOC Initial Note (Signed)
Transition of Care [facility]) - Initial/Assessment Note    Patient Details  Name: Ricky Abbott MRN: [id number] Date of Birth: 1945-06-05  Transition of Care [facility]) CM/SW Contact:    Bethena Roys, RN Phone Number: 11/14/2020, 1:16 PM  Clinical Narrative:  Risk for readmission assessment completed. Patient is from home alone in an apartment in [place]. Patient states he does not drive and uses [employer] for transportation to appointments and uses a cab to get to stores for grocery. Patient states he has a primary care provider and gets his medications appropriately. Case Manager will continue to follow for additional transition of care needs.                  Expected Discharge Plan: [place] Barriers to Discharge: Continued Medical Work up   Patient Goals and CMS Choice Patient states their goals for this hospitalization and ongoing recovery are:: to return home      Expected Discharge Plan and Services Expected Discharge Plan: [place] In-house Referral: NA Discharge Planning Services: CM Consult Post Acute Care Choice: [place] arrangements for the past 2 months: Apartment                     Prior Living Arrangements/Services Living arrangements for the past 2 months: Apartment Lives with:: Self Patient language and need for interpreter reviewed:: Yes        Need for Family Participation in Patient Care: Yes (Comment) Care giver support system in place?: Yes (comment)   Criminal Activity/Legal Involvement Pertinent to Current Situation/Hospitalization: No - Comment as needed  Activities of Daily Living Home Assistive Devices/Equipment: Eyeglasses, Blood pressure cuff ADL Screening (condition at time of admission) Patient's cognitive ability adequate to safely complete daily activities?: Yes Is the patient deaf or have difficulty hearing?: Yes Does the patient have difficulty seeing, even when wearing  glasses/contacts?: Yes Does the patient have difficulty concentrating, remembering, or making decisions?: No Patient able to express need for assistance with ADLs?: Yes Does the patient have difficulty dressing or bathing?: No Independently performs ADLs?: Yes (appropriate for developmental age) Does the patient have difficulty walking or climbing stairs?: Yes Weakness of Legs: Both (gets SOB and legs burn when going up steps) Weakness of Arms/Hands: None  Permission Sought/Granted Permission sought to share information with : Case Manager    Emotional Assessment Appearance:: Appears stated age Attitude/Demeanor/Rapport: Engaged Affect (typically observed): Appropriate Orientation: : Oriented to Situation, Oriented to  Time, Oriented to Place, Oriented to Self Alcohol / Substance Use: Not Applicable Psych Involvement: No (comment)  Admission diagnosis:  Unstable angina (HCC) [I20.0] Coronary artery disease [I25.10] Patient Active Problem List   Diagnosis Date Noted   Coronary artery disease 11/13/2020   Malnutrition of moderate degree 11/09/2020   Chest pain 11/06/2020   Unstable angina ([place]) 10/06/2019   Cholecystitis    Urinary hesitancy due to benign prostatic hyperplasia 05/17/2016   Cervical spondylosis with radiculopathy 05/17/2016   VBI (vertebrobasilar insufficiency) 05/17/2016   Pre-operative cardiovascular examination 04/28/2016   Medication management 04/28/2016   History of alcohol abuse 04/15/2016   Poor diet 04/15/2016   Chronic post-traumatic stress disorder (PTSD) 05/23/2015   DM type 2 with diabetic peripheral neuropathy ([employer]) 05/21/2015   Erectile dysfunction 02/19/2015   COPD, mild ([place]) 03/08/2014   Dyspnea on exertion 03/02/2014   PTSD (post-traumatic stress disorder) 12/16/2013   Sarcoidosis 12/15/2013   History of hepatitis C virus infection  12/15/2013   S/P coronary artery stent placement 09/04/2013   Other chest pain 09/04/2013   CAD, multiple  vessel 08/15/2013   Hyperlipidemia 08/15/2013   Essential hypertension 08/15/2013   Other fatigue 08/15/2013   Tubular adenoma of colon 08/15/2013   Need for tetanus booster 08/15/2013   PCP:  Theotis Burrow, MD Pharmacy:   [facility]  ([place], [facility] [address] [place] [address] [place] [address] Phone: [phone] Fax: [phone]  [id number] [place], [place] [address] Phone: [phone] Fax: [phone]  [facility] [place], [place] [address] Phone: [phone] Fax: [phone]   Readmission Risk Interventions Readmission Risk Prevention Plan 11/14/2020  Transportation Screening Complete  PCP or Specialist Appt within 3-5 Days Complete  HRI or Home Care Consult Complete  Social Work Consult for [place] Planning/Counseling Complete  Palliative Care Screening Not Applicable  Medication Review Press photographer) Complete  Some recent data might be hidden

## 2020-11-15 ENCOUNTER — Inpatient Hospital Stay (HOSPITAL_COMMUNITY): Payer: Medicare Other

## 2020-11-15 LAB — CBC
HCT: 28.5 % — ABNORMAL LOW (ref 39.0–52.0)
Hemoglobin: 9 g/dL — ABNORMAL LOW (ref 13.0–17.0)
MCH: 27.8 pg (ref 26.0–34.0)
MCHC: 31.6 g/dL (ref 30.0–36.0)
MCV: 88 fL (ref 80.0–100.0)
Platelets: 120 10*3/uL — ABNORMAL LOW (ref 150–400)
RBC: 3.24 MIL/uL — ABNORMAL LOW (ref 4.22–5.81)
RDW: 15.5 % (ref 11.5–15.5)
WBC: 14.1 10*3/uL — ABNORMAL HIGH (ref 4.0–10.5)
nRBC: 0 % (ref 0.0–0.2)

## 2020-11-15 LAB — BASIC METABOLIC PANEL
Anion gap: 6 (ref 5–15)
BUN: 25 mg/dL — ABNORMAL HIGH (ref 8–23)
CO2: 24 mmol/L (ref 22–32)
Calcium: 8.1 mg/dL — ABNORMAL LOW (ref 8.9–10.3)
Chloride: 102 mmol/L (ref 98–111)
Creatinine, Ser: 0.99 mg/dL (ref 0.61–1.24)
GFR, Estimated: >60 mL/min (ref >60)
Glucose, Bld: 143 mg/dL — ABNORMAL HIGH (ref 70–99)
Potassium: 4.8 mmol/L (ref 3.5–5.1)
Sodium: 132 mmol/L — ABNORMAL LOW (ref 135–145)

## 2020-11-15 LAB — GLUCOSE, CAPILLARY
Glucose-Capillary: 140 mg/dL — ABNORMAL HIGH (ref 70–99)
Glucose-Capillary: 119 mg/dL — ABNORMAL HIGH (ref 70–99)
Glucose-Capillary: 153 mg/dL — ABNORMAL HIGH (ref 70–99)
Glucose-Capillary: 149 mg/dL — ABNORMAL HIGH (ref 70–99)
Glucose-Capillary: 167 mg/dL — ABNORMAL HIGH (ref 70–99)

## 2020-11-15 MED ORDER — SODIUM CHLORIDE 0.9% FLUSH: 3.0000 mL | INTRAVENOUS | Status: DC | PRN

## 2020-11-15 MED ORDER — CEFAZOLIN SODIUM-DEXTROSE 2-4 GM/100ML-% IV SOLN
2.0000 g | Freq: Once | INTRAVENOUS | Status: AC
  Administered 2020-11-15: 2 g via INTRAVENOUS

## 2020-11-15 MED ORDER — SODIUM CHLORIDE 0.9 % IV BOLUS
250.0000 mL | Freq: Once | INTRAVENOUS | Status: AC
  Administered 2020-11-15: 250 mL via INTRAVENOUS

## 2020-11-15 MED ORDER — FE FUMARATE-B12-VIT C-FA-IFC PO CAPS
1.0000 | ORAL_CAPSULE | Freq: Two times a day (BID) | ORAL | Status: DC
  Administered 2020-11-15 – 2020-11-20 (×11): 1 via ORAL
  Filled 2020-11-15 (×11): qty 1

## 2020-11-15 MED ORDER — SODIUM CHLORIDE 0.9% FLUSH
3.0000 mL | Freq: Two times a day (BID) | INTRAVENOUS | Status: DC
  Administered 2020-11-15 – 2020-11-20 (×10): 3 mL via INTRAVENOUS

## 2020-11-15 MED ORDER — ALBUMIN HUMAN 25 % IV SOLN
12.5000 g | Freq: Once | INTRAVENOUS | Status: AC
  Administered 2020-11-15: 12.5 g via INTRAVENOUS

## 2020-11-15 MED ORDER — ~~LOC~~ CARDIAC SURGERY, PATIENT & FAMILY EDUCATION: Freq: Once | Status: AC

## 2020-11-15 MED ORDER — SODIUM CHLORIDE 0.9 % IV SOLN: 250.0000 mL | INTRAVENOUS | Status: DC | PRN

## 2020-11-15 MED ORDER — INSULIN ASPART 100 UNIT/ML IJ SOLN
0.0000 [IU] | Freq: Three times a day (TID) | INTRAMUSCULAR | Status: DC
  Administered 2020-11-16 – 2020-11-19 (×8): 2 [IU] via SUBCUTANEOUS
  Administered 2020-11-20: 8 [IU] via SUBCUTANEOUS

## 2020-11-15 NOTE — Anesthesia Postprocedure Evaluation (Signed)
Anesthesia Post Note  Patient: Ricky Abbott  Procedure(s) Performed: CORONARY ARTERY BYPASS GRAFTING (CABG) x 4 ON CARDIOPULMONARY BYPASS USING LIMA AND RIGHT GSV. -LIMA to LAD -SVG to OM -SVG to PDA -SVG to DIAGONAL (Chest) TRANSESOPHAGEAL ECHOCARDIOGRAM (TEE) ENDOVEIN HARVEST OF GREATER SAPHENOUS VEIN (Right) APPLICATION OF CELL SAVER     Patient location during evaluation: SICU Anesthesia Type: General Level of consciousness: sedated Pain management: pain level controlled Vital Signs Assessment: post-procedure vital signs reviewed and stable Respiratory status: patient remains intubated per anesthesia plan Cardiovascular status: stable Postop Assessment: no apparent nausea or vomiting Anesthetic complications: no   No notable events documented.  Last Vitals:  Vitals:   11/15/20 0645 11/15/20 0700  BP:  103/79  Pulse: 86 96  Resp: 18 17  Temp:  36.6 C  SpO2: 96% 99%    Last Pain:  Vitals:   11/15/20 0700  TempSrc: Oral  PainSc:                  Ricky Abbott Ricky Abbott

## 2020-11-15 NOTE — Progress Notes (Signed)
Nutrition Follow-up  DOCUMENTATION CODES:   Non-severe (moderate) malnutrition in context of chronic illness  INTERVENTION:   Ensure Enlive po BID, each supplement provides 350 kcal and 20 grams of protein  MVI with Minerals  NUTRITION DIAGNOSIS:   Moderate Malnutrition related to chronic illness (CAD) as evidenced by mild muscle depletion, mild fat depletion.  Being addressed via supplements  GOAL:   Patient will meet greater than or equal to 90% of their needs  Progressing  MONITOR:   PO intake, Supplement acceptance, Labs  REASON FOR ASSESSMENT:   Malnutrition Screening Tool    ASSESSMENT:   75 yo male admitted with chest pain, multivessel CAD. PMH includes CAD, DM-2, HTN, HLD, hepatitis, sarcoidosis of the lung, COPD, prior tobacco use, PTSD, ETOH abuse, depression.  10/04 CABG x 4, Extubated  Pt reports poor appetite post surgery. No recorded po intake since 9/30; pt ate some breakfast this AM but does not want lunch. Pt reports he is drinking the Ensure.   Pt reports some nausea, no vomiting.   Chest tubes with 30 mL out today  Current wt 87.5 kg; weight 86.9 kg prior to surgery yesterday. Wt 81.6 on admit  Labs: sodium 132 (L) Meds: Trinsicon, ss novolog, MVI with Minerals, miralax    Diet Order:   Diet Order             Diet heart healthy/carb modified Room service appropriate? Yes; Fluid consistency: Thin  Diet effective now                   EDUCATION NEEDS:   Education needs have been addressed  Skin:  Skin Assessment: Skin Integrity Issues: Skin Integrity Issues:: Incisions Incisions: chest-sternum (closed)  Last BM:  10/3  Height:   Ht Readings from Last 1 Encounters:  11/06/20 5\' 11"  (1.803 m)    Weight:   Wt Readings from Last 1 Encounters:  11/15/20 87.5 kg    BMI:  Body mass index is 26.9 kg/m.  Estimated Nutritional Needs:   Kcal:  2000-2200  Protein:  100-115 gm  Fluid:  2 L   Kerman Passey MS, RDN,  LDN, CNSC Registered Dietitian III Clinical Nutrition RD Pager and On-Call Pager Number Located in [place]

## 2020-11-15 NOTE — Progress Notes (Addendum)
[address]       [place] [address]             [phone]      2 Days Post-Op Procedure(s) (LRB): CORONARY ARTERY BYPASS GRAFTING (CABG) x 4 ON CARDIOPULMONARY BYPASS USING LIMA AND RIGHT GSV. -LIMA to LAD -SVG to OM -SVG to PDA -SVG to DIAGONAL (N/A) TRANSESOPHAGEAL ECHOCARDIOGRAM (TEE) (N/A) ENDOVEIN HARVEST OF GREATER SAPHENOUS VEIN (Right) APPLICATION OF CELL SAVER (N/A) Subjective: Mild nausea  Objective: Vital signs in last 24 hours: Temp:  [97.7 F (36.5 C)-100 F (37.8 C)] 97.8 F (36.6 C) (10/06 0700) Pulse Rate:  [67-98] 96 (10/06 0700) Cardiac Rhythm: Normal sinus rhythm (10/06 0400) Resp:  [11-31] 17 (10/06 0700) BP: (90-132)/(43-110) 103/79 (10/06 0700) SpO2:  [71 %-100 %] 99 % (10/06 0700) Arterial Line BP: (93-139)/(37-74) 113/50 (10/06 0700) FiO2 (%):  [40 %] 40 % (10/05 0847) Weight:  [87.5 kg] 87.5 kg (10/06 0500)  Hemodynamic parameters for last 24 hours: CVP:  [3 mmHg-14 mmHg] 3 mmHg  Intake/Output from previous day: 10/05 0701 - 10/06 0700 In: 1895.6 [P.O.:717; I.V.:668.4; IV Piggyback:510.2] Out: 466 [Urine:245; Drains:61; Chest Tube:160] Intake/Output this shift: No intake/output data recorded.  General appearance: alert, cooperative, and no distress Heart: regular rate and rhythm Lungs: coarse BS Abdomen: soft, non-tender; bowel sounds normal; no masses,  no organomegaly Extremities: minor edema Wound: dressing clean chest, evh sites ok  Lab Results: Recent Labs    11/14/20 1651 11/15/20 0403  WBC 19.0* 14.1*  HGB 10.6* 9.0*  HCT 32.9* 28.5*  PLT 177 120*   BMET:  Recent Labs    11/14/20 1651 11/15/20 0403  NA 133* 132*  K 4.6 4.8  CL 103 102  CO2 20* 24  GLUCOSE 215* 143*  BUN 22 25*  CREATININE 1.02 0.99  CALCIUM 8.2* 8.1*    PT/INR:  Recent Labs    11/13/20 1750  LABPROT 16.6*  INR 1.3*   ABG    Component Value Date/Time   PHART 7.363 11/14/2020 1013   HCO3 19.1 (L) 11/14/2020 1013    TCO2 20 (L) 11/14/2020 1013   ACIDBASEDEF 6.0 (H) 11/14/2020 1013   O2SAT 99.0 11/14/2020 1013   CBG (last 3)  Recent Labs    11/14/20 2356 11/15/20 0405 11/15/20 0659  GLUCAP 133* 140* 119*    Meds Scheduled Meds:  acetaminophen  1,000 mg Oral Q6H   Or   acetaminophen (TYLENOL) oral liquid 160 mg/5 mL  1,000 mg Per Tube Q6H   aspirin EC  81 mg Oral Daily   bisacodyl  10 mg Oral Daily   Or   bisacodyl  10 mg Rectal Daily   Chlorhexidine Gluconate Cloth  6 each Topical Daily   clopidogrel  75 mg Oral Daily   docusate sodium  200 mg Oral Daily   enoxaparin (LOVENOX) injection  40 mg Subcutaneous QHS   ezetimibe  10 mg Oral Daily   feeding supplement  237 mL Oral BID BM   insulin aspart  0-24 Units Subcutaneous Q4H   metoprolol tartrate  12.5 mg Oral BID   Or   metoprolol tartrate  12.5 mg Per Tube BID   midodrine  10 mg Oral TID WC   multivitamin with minerals  1 tablet Oral Daily   pantoprazole  40 mg Oral Daily   polyethylene glycol  17 g Oral Daily   rosuvastatin  40 mg Oral Daily   sertraline  100 mg Oral  Daily   sodium chloride flush  10-40 mL Intracatheter Q12H   sodium chloride flush  3 mL Intravenous Q12H   tamsulosin  0.4 mg Oral QPC supper   Continuous Infusions:  sodium chloride Stopped (11/14/20 1337)   sodium chloride Stopped (11/14/20 1900)   sodium chloride     albumin human Stopped (11/14/20 1920)    ceFAZolin (ANCEF) IV Stopped (11/15/20 0623)   lactated ringers     lactated ringers     lactated ringers Stopped (11/13/20 2020)   phenylephrine (NEO-SYNEPHRINE) Adult infusion 10 mcg/min (11/15/20 0700)   PRN Meds:.sodium chloride, albumin human **AND** [COMPLETED] sodium chloride, cyclobenzaprine, dextrose, lactated ringers, levalbuterol, metoprolol tartrate, midazolam, morphine injection, ondansetron (ZOFRAN) IV, oxyCODONE, sodium chloride flush, sodium chloride flush, traMADol  Xrays DG Chest Port 1 View  Result Date: 11/14/2020 CLINICAL  DATA:  Status post open heart surgery EXAM: PORTABLE CHEST 1 VIEW COMPARISON:  11/13/2020 FINDINGS: Bibasilar atelectasis. Bilateral chest tubes in satisfactory position. Nasogastric tube coursing below the diaphragm. Endotracheal tube 4.8 cm above the carina. Right-sided central venous catheter projecting over the SVC. No pleural effusion or pneumothorax. Heart and mediastinal contours are unremarkable. CABG. No acute osseous abnormality. IMPRESSION: 1. Status post CABG. No pneumothorax. Support lines and tubing in satisfactory position. Electronically Signed   By: Kathreen Devoid M.D.   On: 11/14/2020 08:51   DG Chest Port 1 View  Result Date: 11/13/2020 CLINICAL DATA:  Evaluate for pneumothorax.  Status post CABG EXAM: PORTABLE CHEST 1 VIEW COMPARISON:  11/12/2020 FINDINGS: There is a right IJ catheter with tip in the projection of the SVC. The ET tube tip is above the carina. There is a nasogastric tube with tip and side port below the GE junction. Signs of recent CABG procedure with median sternotomy noted. Mediastinal drain and bilateral chest tubes are in place. No pneumothorax identified. Platelike atelectasis noted within the left midlung. No pleural effusion or signs of interstitial edema. IMPRESSION: 1. Status post CABG procedure. No pneumothorax. 2. Platelike atelectasis within the left midlung. Electronically Signed   By: Kerby Moors M.D.   On: 11/13/2020 19:00   ECHO INTRAOPERATIVE TEE  Result Date: 11/13/2020  *INTRAOPERATIVE TRANSESOPHAGEAL REPORT *  Patient Name:   Ricky Abbott Date of Exam: 11/13/2020 Medical Rec #:  [id number]         Height:       71.0 in Accession #:    [id number]        Weight:       179.8 lb Date of Birth:  [birth year]         BSA:          2.02 m Patient Age:    75 years          BP:           145/78 mmHg Patient Gender: M                 HR:           65 bpm. Exam Location:  Anesthesiology Transesophogeal exam was perform intraoperatively during surgical procedure.  Patient was closely monitored under general anesthesia during the entirety of examination. Indications:     CAD Performing Phys: [id number] Lucile Crater Hashim Eichhorst Diagnosing Phys: Belenda Cruise Stoltzfus Complications: No known complications during this procedure. POST-OP IMPRESSIONS _ Left Ventricle: has mildly reduced systolic function. The cavity size was normal. The wall motion is normal. _ Right Ventricle: The right ventricle appears unchanged from pre-bypass. _ Aorta: The  aorta appears unchanged from pre-bypass. _ Left Atrial Appendage: The left atrial appendage appears unchanged from pre-bypass. _ Aortic Valve: The aortic valve appears unchanged from pre-bypass. _ Mitral Valve: The mitral valve appears unchanged from pre-bypass. _ Tricuspid Valve: The tricuspid valve appears unchanged from pre-bypass. _ Interatrial Septum: The interatrial septum appears unchanged from pre-bypass. _ Pericardium: The pericardium appears unchanged from pre-bypass. 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 No new or worsening wall motion or valvular abnormalities. Mildly reduced EF 45-50% unchanged. PRE-OP FINDINGS  Left Ventricle: The left ventricle has mildly reduced systolic function, with an ejection fraction of 45-50%. The cavity size was normal. No evidence of left ventricular regional wall motion abnormalities. There is borderline left ventricular hypertrophy. Left ventricular diastolic parameters were normal. Right Ventricle: The right ventricle has normal systolic function. The cavity was normal. There is no increase in right ventricular wall thickness. Left Atrium: Left atrial size was normal in size. No left atrial/left atrial appendage thrombus was detected. Left atrial appendage velocity is reduced at less than 40 cm/s. Right Atrium: Right atrial size was normal in size. Interatrial Septum: No atrial level shunt detected by color flow Doppler. The interatrial septum appears to be lipomatous. There is no evidence  of a patent foramen ovale. Pericardium: Trivial pericardial effusion is present. The pericardial effusion is surrounding the apex. There is no pleural effusion. Mitral Valve: The mitral valve is normal in structure. Mitral valve regurgitation is mild by color flow Doppler. There is no evidence of mitral valve vegetation. There is No evidence of mitral stenosis. Tricuspid Valve: The tricuspid valve was normal in structure. Tricuspid valve regurgitation was not visualized by color flow Doppler. No evidence of tricuspid stenosis is present. There is no evidence of tricuspid valve vegetation. Aortic Valve: The aortic valve is tricuspid Aortic valve regurgitation was not visualized by color flow Doppler. There is no stenosis of the aortic valve, with a calculated valve area of 2.12 cm. There is no evidence of aortic valve vegetation. Pulmonic Valve: The pulmonic valve was normal in structure, with normal. No evidence of pumonic stenosis. Pulmonic valve regurgitation is not visualized by color flow Doppler. Aorta: The ascending aorta and aortic root are normal in size and structure. Pulmonary Artery: The pulmonary artery is of normal size. Venous: The inferior vena cava is normal in size with less than 50% respiratory variability, suggesting right atrial pressure of 8 mmHg. Shunts: There is no evidence of an atrial septal defect. +--------------+--------++ LEFT VENTRICLE         +----------------+---------++ +--------------+--------++ Diastology                PLAX 2D                +----------------+---------++ +--------------+--------++ LV e' lateral:  6.69 cm/s LVIDd:        5.12 cm  +----------------+---------++ +--------------+--------++ LV E/e' lateral:8.1       LVIDs:        3.89 cm  +----------------+---------++ +--------------+--------++ LV e' medial:   5.56 cm/s LV PW:        1.10 cm  +----------------+---------++ +--------------+--------++ LV E/e' medial: 9.8       LV  IVS:       1.30 cm  +----------------+---------++ +--------------+--------++ LVOT diam:    2.10 cm  +----------------------+-------++ +--------------+--------++ 2D Longitudinal Strain        LV SV:        59 ml    +----------------------+-------++ +--------------+--------++ 2D Strain GLS (  A2C):  -12.1 % LV SV Index:  29.29    +----------------------+-------++ +--------------+--------++ 2D Strain GLS (A3C):  -11.8 % LVOT Area:    3.46 cm +----------------------+-------++ +--------------+--------++ 2D Strain GLS (A4C):  -16.4 %                        +----------------------+-------++ +--------------+--------++ 2D Strain GLS Avg:    -13.4 %                            +----------------------+-------++                             +------------+--------++                            3D Volume EF                                    +------------+--------++                            LV 3D EDV:  91.00 ml                            +------------+--------++                            LV 3D ESV:  50.00 ml                            +------------+--------++ +------------------+-----------++ AORTIC VALVE                  +------------------+-----------++ AV Area (Vmax):   2.06 cm    +------------------+-----------++ AV Area (Vmean):  1.61 cm    +------------------+-----------++ AV Area (VTI):    2.12 cm    +------------------+-----------++ AV Vmax:          125.00 cm/s +------------------+-----------++ AV Vmean:         85.800 cm/s +------------------+-----------++ AV VTI:           0.310 m     +------------------+-----------++ AV Peak Grad:     6.2 mmHg    +------------------+-----------++ AV Mean Grad:     3.0 mmHg    +------------------+-----------++ LVOT Vmax:        74.50 cm/s  +------------------+-----------++ LVOT Vmean:       40.000 cm/s +------------------+-----------++ LVOT VTI:         0.190 m      +------------------+-----------++ LVOT/AV VTI ratio:0.61        +------------------+-----------++  +--------------+-------++ AORTA                 +--------------+-------++ Ao Sinus diam:3.50 cm +--------------+-------++ Ao STJ diam:  3.0 cm  +--------------+-------++ Ao Asc diam:  3.40 cm +--------------+-------++ +--------------+----------++ MITRAL VALVE             +--------------+-------+ +--------------+----------++ SHUNTS                MV Area (PHT):3.77 cm   +--------------+-------+ +--------------+----------++ Systemic VTI: 0.19 m  MV Peak grad: 1.6 mmHg   +--------------+-------+ +--------------+----------++ Systemic Diam:2.10 cm MV Mean grad: 1.0 mmHg   +--------------+-------+ +--------------+----------++  MV Vmax:      0.62 m/s   +--------------+----------++ MV Vmean:     35.8 cm/s  +--------------+----------++ MV PHT:       58.29 msec +--------------+----------++ MV Decel Time:201 msec   +--------------+----------++ +--------------+----------++ MV E velocity:54.40 cm/s +--------------+----------++ MV A velocity:61.00 cm/s +--------------+----------++ MV E/A ratio: 0.89       +--------------+----------++  Rochele Pages Electronically signed by Rochele Pages Signature Date/Time: 11/13/2020/5:15:02 PM    Final     Assessment/Plan: S/P Procedure(s) (LRB): CORONARY ARTERY BYPASS GRAFTING (CABG) x 4 ON CARDIOPULMONARY BYPASS USING LIMA AND RIGHT GSV. -LIMA to LAD -SVG to OM -SVG to PDA -SVG to DIAGONAL (N/A) TRANSESOPHAGEAL ECHOCARDIOGRAM (TEE) (N/A) ENDOVEIN HARVEST OF GREATER SAPHENOUS VEIN (Right) APPLICATION OF CELL SAVER (N/A)  1 afeb, Tmax 100, BP low at times-neo just weaned off, midodrine has been added- to receive albumin this am, sinus rhythm 2 sats ok on 2 liters 3 weight trending lower, UOP not accurate, creat is normal- will begin diuresis 4 expected ABLA - trending lower- not at transfusion threshold-  will add trinsicon 5 thrombocytopenia- monitor clinically on plavix and lovenox, repeat CBC in am 6 BS controlled, no preop meds for diabetes 7 CXR basilar atx 8 leukocytosis trend improving 9 tubes/drains - to be removed 10 tx to 4 e 11 routine pulm toilet/rehab    LOS: 9 days    John Giovanni PA-C Pager [phone] 11/15/2020    Agree with above Weaning neo Will transfer to floor  [place]

## 2020-11-15 NOTE — Progress Notes (Signed)
Mobility Specialist Progress Note:   11/15/20 1700  Mobility  Activity Ambulated in hall  Level of Assistance Minimal assist, patient does 75% or more  Assistive Device Front wheel walker  Distance Ambulated (ft) 220 ft  Mobility Ambulated with assistance in hallway  Mobility Response Tolerated well  Mobility performed by Mobility specialist  Bed Position Chair  $Mobility charge 1 Mobility   Pre- Mobility:  87 HR;  111/56 BP;  92% SpO2 Post Mobility:   109 HR; 127/50 BP; 93% SpO2  Pt received in chair willing to participate in mobility. MinA to stand then contact guard throughout ambulation. Pt asx. Returned to chair with call bell in reach and all needs met.   [facility] and safety inspector Phone [phone]

## 2020-11-16 LAB — CBC
HCT: 26.9 % — ABNORMAL LOW (ref 39.0–52.0)
Hemoglobin: 8.7 g/dL — ABNORMAL LOW (ref 13.0–17.0)
MCH: 28.2 pg (ref 26.0–34.0)
MCHC: 32.3 g/dL (ref 30.0–36.0)
MCV: 87.3 fL (ref 80.0–100.0)
Platelets: 133 10*3/uL — ABNORMAL LOW (ref 150–400)
RBC: 3.08 MIL/uL — ABNORMAL LOW (ref 4.22–5.81)
RDW: 15.2 % (ref 11.5–15.5)
WBC: 11.2 10*3/uL — ABNORMAL HIGH (ref 4.0–10.5)
nRBC: 0 % (ref 0.0–0.2)

## 2020-11-16 LAB — GLUCOSE, CAPILLARY
Glucose-Capillary: 140 mg/dL — ABNORMAL HIGH (ref 70–99)
Glucose-Capillary: 113 mg/dL — ABNORMAL HIGH (ref 70–99)
Glucose-Capillary: 149 mg/dL — ABNORMAL HIGH (ref 70–99)
Glucose-Capillary: 145 mg/dL — ABNORMAL HIGH (ref 70–99)

## 2020-11-16 LAB — BASIC METABOLIC PANEL
Anion gap: 6 (ref 5–15)
BUN: 30 mg/dL — ABNORMAL HIGH (ref 8–23)
CO2: 24 mmol/L (ref 22–32)
Calcium: 8.2 mg/dL — ABNORMAL LOW (ref 8.9–10.3)
Chloride: 99 mmol/L (ref 98–111)
Creatinine, Ser: 1.02 mg/dL (ref 0.61–1.24)
GFR, Estimated: >60 mL/min (ref >60)
Glucose, Bld: 142 mg/dL — ABNORMAL HIGH (ref 70–99)
Potassium: 4.5 mmol/L (ref 3.5–5.1)
Sodium: 129 mmol/L — ABNORMAL LOW (ref 135–145)

## 2020-11-16 MED ORDER — FUROSEMIDE 40 MG PO TABS
40.0000 mg | ORAL_TABLET | Freq: Every day | ORAL | Status: DC
  Administered 2020-11-16 – 2020-11-18 (×3): 40 mg via ORAL
  Filled 2020-11-16 (×3): qty 1

## 2020-11-16 MED ORDER — POTASSIUM CHLORIDE CRYS ER 10 MEQ PO TBCR
20.0000 meq | EXTENDED_RELEASE_TABLET | Freq: Every day | ORAL | Status: DC
  Administered 2020-11-16 – 2020-11-18 (×3): 20 meq via ORAL
  Filled 2020-11-16 (×5): qty 2

## 2020-11-16 NOTE — Evaluation (Signed)
Physical Therapy Evaluation Patient Details Name: Ricky Abbott MRN: [id number] DOB: Jul 21, 1945 Today's Date: 11/16/2020  History of Present Illness  Pt is a 75 y/o male admitted 9/26 secondary to worsening chest pain.Thought to be from unstable angina with 3 vessel CAD. 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 PMH includes COPD, DM, CAD, HTN, and sarcoidosis.  Clinical Impression  Pt admitted secondary to problem above with deficits below. Required multiple safety cues throughout during transfers and gait and to maintain precautions. Required mod A for standing as pt with LOB, and required min A for gait using RW. Pt currently lives alone and does not have anyone to stay with him. Feel he is at increased risk for falls given current deficits and would benefit from SNF level therapies to increase independence and safety. Will continue to follow acutely.        Recommendations for follow up therapy are one component of a multi-disciplinary discharge planning process, led by the attending physician.  Recommendations may be updated based on patient status, additional functional criteria and insurance authorization.  Follow Up Recommendations SNF;Supervision for mobility/OOB    Equipment Recommendations  Rolling walker with 5" wheels;3in1 (PT)    Recommendations for Other Services       Precautions / Restrictions Precautions Precautions: Sternal Precaution Booklet Issued: No Precaution Comments: Verbally reviewed sternal precautions as pt unable to recall. Restrictions Weight Bearing Restrictions: Yes (sternal)      Mobility  Bed Mobility               General bed mobility comments: In recliner upon entry    Transfers Overall transfer level: Needs assistance Equipment used: Rolling walker (2 wheeled) Transfers: Sit to/from Stand Sit to Stand: Mod assist         General transfer comment: Pt very unsteady when standing and with posterior LOB requiring mod A to correct. Cues for  correct hand placement to maintain precautions.  Ambulation/Gait Ambulation/Gait assistance: Min assist Gait Distance (Feet): 75 Feet Assistive device: Rolling walker (2 wheeled) Gait Pattern/deviations: Step-through pattern;Decreased stride length;Drifts right/left Gait velocity: Decreased   General Gait Details: Very short, guarded steps. Min A for steadying and cues for correct RW usage. Pt also tended to drift to the L and required cues to stay on straight path.  Stairs            Wheelchair Mobility    Modified Rankin (Stroke Patients Only)       Balance Overall balance assessment: Needs assistance Sitting-balance support: No upper extremity supported;Feet supported Sitting balance-Leahy Scale: Good     Standing balance support: Bilateral upper extremity supported Standing balance-Leahy Scale: Poor Standing balance comment: Reliant on UE support                             Pertinent Vitals/Pain Pain Assessment: Faces Faces Pain Scale: Hurts little more Pain Location: BLE Pain Descriptors / Indicators: Burning Pain Intervention(s): Limited activity within patient's tolerance;Monitored during session;Repositioned    Home Living Family/patient expects to be discharged to:: Private residence Living Arrangements: Alone Available Help at Discharge: Neighbor;Available PRN/intermittently Type of Home: Apartment Home Access: Ramped entrance     Home Layout: One level Home Equipment: None      Prior Function Level of Independence: Independent               Hand Dominance   Dominant Hand: Left    Extremity/Trunk Assessment   Upper Extremity  Assessment Upper Extremity Assessment: Defer to OT evaluation    Lower Extremity Assessment Lower Extremity Assessment: Generalized weakness    Cervical / Trunk Assessment Cervical / Trunk Assessment: Kyphotic  Communication   Communication: No difficulties  Cognition Arousal/Alertness:  Awake/alert Behavior During Therapy: WFL for tasks assessed/performed Overall Cognitive Status: No family/caregiver present to determine baseline cognitive functioning                                 General Comments: Pt with difficulty remembering precautions. Slow processing noted as well.      General Comments      Exercises     Assessment/Plan    PT Assessment Patient needs continued PT services  PT Problem List Decreased strength;Decreased balance;Decreased activity tolerance;Decreased mobility;Decreased knowledge of use of DME;Decreased knowledge of precautions;Pain;Decreased safety awareness       PT Treatment Interventions DME instruction;Gait training;Functional mobility training;Therapeutic activities;Therapeutic exercise;Balance training;Patient/family education    PT Goals (Current goals can be found in the Care Plan section)  Acute Rehab PT Goals Patient Stated Goal: to go home PT Goal Formulation: With patient Time For Goal Achievement: 11/30/20 Potential to Achieve Goals: Good    Frequency Min 2X/week   Barriers to discharge Decreased caregiver support      Co-evaluation               AM-PAC PT "6 Clicks" Mobility  Outcome Measure Help needed turning from your back to your side while in a flat bed without using bedrails?: A Little Help needed moving from lying on your back to sitting on the side of a flat bed without using bedrails?: A Little Help needed moving to and from a bed to a chair (including a wheelchair)?: A Little Help needed standing up from a chair using your arms (e.g., wheelchair or bedside chair)?: A Lot Help needed to walk in hospital room?: A Little Help needed climbing 3-5 steps with a railing? : A Lot 6 Click Score: 16    End of Session Equipment Utilized During Treatment: Gait belt Activity Tolerance: Patient tolerated treatment well Patient left: in chair;with call bell/phone within reach Nurse Communication:  Mobility status PT Visit Diagnosis: Unsteadiness on feet (R26.81);Muscle weakness (generalized) (M62.81);Difficulty in walking, not elsewhere classified (R26.2)    Time: 2197-5883 PT Time Calculation (min) (ACUTE ONLY): 15 min   Charges:   PT Evaluation $PT Eval Moderate Complexity: 1 Mod          Reuel Derby, PT, DPT  Acute Rehabilitation Services  Pager: [phone] Office: [phone]   Rudean Hitt 11/16/2020, 9:40 AM

## 2020-11-16 NOTE — Progress Notes (Addendum)
      [address]       [place] [address]             [phone]      3 Days Post-Op Procedure(s) (LRB): CORONARY ARTERY BYPASS GRAFTING (CABG) x 4 ON CARDIOPULMONARY BYPASS USING LIMA AND RIGHT GSV. -LIMA to LAD -SVG to OM -SVG to PDA -SVG to DIAGONAL (N/A) TRANSESOPHAGEAL ECHOCARDIOGRAM (TEE) (N/A) ENDOVEIN HARVEST OF GREATER SAPHENOUS VEIN (Right) APPLICATION OF CELL SAVER (N/A) Subjective: Feels okay this morning, no complaints.   Objective: Vital signs in last 24 hours: Temp:  [97.7 F (36.5 C)-98.7 F (37.1 C)] 98.7 F (37.1 C) (10/07 0314) Pulse Rate:  [80-93] 80 (10/07 0314) Cardiac Rhythm: Normal sinus rhythm;Bundle branch block (10/06 1935) Resp:  [13-30] 19 (10/07 0314) BP: (93-124)/(52-80) 111/52 (10/07 0314) SpO2:  [91 %-100 %] 99 % (10/07 0314) Arterial Line BP: (95-122)/(40-45) 122/45 (10/06 0845) FiO2 (%):  [95 %] 95 % (10/07 0040) Weight:  [89.6 kg] 89.6 kg (10/07 0314)    Intake/Output from previous day: 10/06 0701 - 10/07 0700 In: 723 [P.O.:720; I.V.:3] Out: 1545 [Urine:1530; Drains:15] Intake/Output this shift: No intake/output data recorded.  General appearance: alert, cooperative, and no distress Lungs: clear to auscultation bilaterally Abdomen: soft, non-tender; bowel sounds normal; no masses,  no organomegaly Extremities: extremities normal, atraumatic, no cyanosis or edema Wound: clean and dry  Lab Results: Recent Labs    11/15/20 0403 11/16/20 0151  WBC 14.1* 11.2*  HGB 9.0* 8.7*  HCT 28.5* 26.9*  PLT 120* 133*   BMET:  Recent Labs    11/15/20 0403 11/16/20 0151  NA 132* 129*  K 4.8 4.5  CL 102 99  CO2 24 24  GLUCOSE 143* 142*  BUN 25* 30*  CREATININE 0.99 1.02  CALCIUM 8.1* 8.2*    PT/INR:  Recent Labs    11/13/20 1750  LABPROT 16.6*  INR 1.3*   ABG    Component Value Date/Time   PHART 7.363 11/14/2020 1013   HCO3 19.1 (L) 11/14/2020 1013   TCO2 20 (L) 11/14/2020 1013   ACIDBASEDEF 6.0 (H)  11/14/2020 1013   O2SAT 99.0 11/14/2020 1013   CBG (last 3)  Recent Labs    11/15/20 1623 11/15/20 2101 11/16/20 0616  GLUCAP 149* 167* 140*    Assessment/Plan: S/P Procedure(s) (LRB): CORONARY ARTERY BYPASS GRAFTING (CABG) x 4 ON CARDIOPULMONARY BYPASS USING LIMA AND RIGHT GSV. -LIMA to LAD -SVG to OM -SVG to PDA -SVG to DIAGONAL (N/A) TRANSESOPHAGEAL ECHOCARDIOGRAM (TEE) (N/A) ENDOVEIN HARVEST OF GREATER SAPHENOUS VEIN (Right) APPLICATION OF CELL SAVER (N/A)  CV- NSR in the 80s, hypotension, on midodrine 10mg  TID. Continue low-dose metoprolol. Continue asa/plavix/statin Pulm- tolerating 2L [place] with good oxygen saturation.CXR yesterday stable Renal-creatinine 1.02, electrolytes okay. Weight not accurate. Start lasix 40mg  daily with potassium replacement.  H and H stable 8.7/26.9 Endo- blood glucose well controlled, continue current regimen   Plan: He is doing very well and we are planning for discharge. PT has been consulted for possible need of CIR. He does live alone and he could have neighbors check in on him but the safer alternative would be CIR.    LOS: 10 days    Ricky Abbott 11/16/2020  Agree with above PT consulted for dispo planning.  Pt lives alone, so hopefully with transfer to [place] ambulation  [place]

## 2020-11-16 NOTE — Care Management Important Message (Signed)
Important Message  Patient Details  Name: Ricky Abbott MRN: [id number] Date of Birth: 04-06-1945   Medicare Important Message Given:  Yes     Shelda Altes 11/16/2020, 10:23 AM

## 2020-11-16 NOTE — Progress Notes (Signed)
CARDIAC REHAB PHASE I   PRE:  Rate/Rhythm: 86 SR  BP:  Sitting: 110/94      SaO2: 93 RA  MODE:  Ambulation: 150 ft   POST:  Rate/Rhythm: 102 ST  BP:  Sitting: 121/49    SaO2: 94 RA   Pt agreeable to ambulate. Pt ax1 to stand, needing reinforcement of sternal precautions. Pt ambulated 130ft in hallway assist of one with front wheel walker, with slow short steps. Pt took a short standing rest break half way c/o leg fatigue. Pt returned to recliner. Demonstrating ~1000 on IS. Encouraged continued ambulation and IS use. Will continue to follow.  [id number] Rufina Falco, RN BSN 11/16/2020 8:39 AM

## 2020-11-16 NOTE — Progress Notes (Signed)
Patient ambulated in hallway with nursing staff. 240 feet. Patient appears short of breath but states he is not. Patient ambulated on room air. Patients gait shuffles at time. Needing reminders to walk into walker. Back in room assisted to chair call bell with in reach. Ary Lavine, Bettina Gavia RN

## 2020-11-17 LAB — GLUCOSE, CAPILLARY
Glucose-Capillary: 127 mg/dL — ABNORMAL HIGH (ref 70–99)
Glucose-Capillary: 121 mg/dL — ABNORMAL HIGH (ref 70–99)
Glucose-Capillary: 148 mg/dL — ABNORMAL HIGH (ref 70–99)
Glucose-Capillary: 169 mg/dL — ABNORMAL HIGH (ref 70–99)

## 2020-11-17 NOTE — Evaluation (Signed)
Occupational Therapy Evaluation Patient Details Name: Ricky Abbott MRN: [id number] DOB: 01/08/1946 Today's Date: 11/17/2020   History of Present Illness Pt is a 74 y/o male admitted 9/26 secondary to worsening chest pain.Thought to be from unstable angina with 3 vessel CAD. 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 PMH includes COPD, DM, CAD, HTN, and sarcoidosis.   Clinical Impression   Patient admitted for the diagnosis and procedure above.  PTA he lived alone in his apartment, and needed no assistance with ADL/IADL and mobility.  Deficits impacting independence are listed below.  Currently needing up to [facility] for mobility, and Min a for lower body ADL.  Safety and cues for sternal precautions continue to be needed.  Patient has no assist at home, so SNF is recommended for post acute rehab.  OT will continue in the acute setting to maximize functional status.        Recommendations for follow up therapy are one component of a multi-disciplinary discharge planning process, led by the attending physician.  Recommendations may be updated based on patient status, additional functional criteria and insurance authorization.   Follow Up Recommendations  SNF    Equipment Recommendations  Tub/shower seat    Recommendations for Other Services       Precautions / Restrictions Precautions Precautions: Sternal;Fall Precaution Comments: continues to need cueing for precautions. Restrictions Weight Bearing Restrictions: No      Mobility Bed Mobility Overal bed mobility: Needs Assistance Bed Mobility: Sidelying to Sit   Sidelying to sit: Supervision       General bed mobility comments: cues for sternal precautions    Transfers Overall transfer level: Needs assistance   Transfers: Sit to/from Stand;Stand Pivot Transfers Sit to Stand: Min guard Stand pivot transfers: Min guard       General transfer comment: better balance this date, but continues to be unsteady, unsafe, and impulsive.   Decreased awerness of deficits.    Balance Overall balance assessment: Independent Sitting-balance support: No upper extremity supported;Feet supported Sitting balance-Leahy Scale: Good     Standing balance support: No upper extremity supported Standing balance-Leahy Scale: Poor                             ADL either performed or assessed with clinical judgement   ADL       Grooming: Wash/dry hands;Wash/dry face;Oral care;Standing       Lower Body Bathing: Minimal assistance;Sit to/from stand       Lower Body Dressing: Minimal assistance;Sit to/from stand   Toilet Transfer: Min guard;Ambulation   Toileting- Clothing Manipulation and Hygiene: Sit to/from stand;Min guard       Functional mobility during ADLs: Min guard General ADL Comments: patient able to mobilize without RW in the room.  unsteady     Vision Baseline Vision/History: 1 Wears glasses Patient Visual Report: No change from baseline                  Pertinent Vitals/Pain Pain Assessment: Faces Faces Pain Scale: Hurts little more Pain Location: chest with coughing Pain Descriptors / Indicators: Tender;Sharp Pain Intervention(s): Monitored during session     Hand Dominance Left   Extremity/Trunk Assessment Upper Extremity Assessment Upper Extremity Assessment: Overall WFL for tasks assessed   Lower Extremity Assessment Lower Extremity Assessment: Defer to PT evaluation   Cervical / Trunk Assessment Cervical / Trunk Assessment: Kyphotic   Communication Communication Communication: No difficulties   Cognition Arousal/Alertness: Awake/alert  Behavior During Therapy: WFL for tasks assessed/performed Overall Cognitive Status: Impaired/Different from baseline Area of Impairment: Orientation;Memory;Following commands;Safety/judgement;Problem solving                 Orientation Level: Time   Memory: Decreased recall of precautions;Decreased short-term memory Following  Commands: Follows one step commands consistently Safety/Judgement: Decreased awareness of deficits   Problem Solving: Requires verbal cues General Comments: continues to be impulsive, move quickly without regard for precautions.   General Comments   VSS on RA    Exercises     Shoulder Instructions      Home Living Family/patient expects to be discharged to:: Private residence Living Arrangements: Alone Available Help at Discharge: Neighbor;Available PRN/intermittently Type of Home: Apartment Home Access: Ramped entrance     Home Layout: One level     Bathroom Shower/Tub: Occupational psychologist: Standard     Home Equipment: None          Prior Functioning/Environment Level of Independence: Independent                 OT Problem List: Decreased activity tolerance;Impaired balance (sitting and/or standing);Decreased knowledge of use of DME or AE;Decreased safety awareness;Decreased cognition;Pain      OT Treatment/Interventions: Self-care/ADL training;Therapeutic exercise;Therapeutic activities;Cognitive remediation/compensation;Balance training    OT Goals(Current goals can be found in the care plan section) Acute Rehab OT Goals Patient Stated Goal: Go to rehab first and get a little stronger OT Goal Formulation: With patient Time For Goal Achievement: 12/01/20 Potential to Achieve Goals: Good ADL Goals Pt Will Perform Grooming: with set-up;standing Pt Will Perform Lower Body Bathing: with set-up;sit to/from stand Pt Will Perform Lower Body Dressing: with set-up;sit to/from stand Pt Will Transfer to Toilet: with modified independence;ambulating;regular height toilet Pt Will Perform Toileting - Clothing Manipulation and hygiene: with modified independence;sit to/from stand  OT Frequency: Min 2X/week   Barriers to D/C:    none noted       Co-evaluation              AM-PAC OT "6 Clicks" Daily Activity     Outcome Measure Help from  another person eating meals?: None Help from another person taking care of personal grooming?: A Little Help from another person toileting, which includes using toliet, bedpan, or urinal?: A Little Help from another person bathing (including washing, rinsing, drying)?: A Little Help from another person to put on and taking off regular upper body clothing?: A Little Help from another person to put on and taking off regular lower body clothing?: A Little 6 Click Score: 19   End of Session Nurse Communication: Mobility status  Activity Tolerance: Patient tolerated treatment well Patient left: in chair;with call bell/phone within reach  OT Visit Diagnosis: Unsteadiness on feet (R26.81);Other symptoms and signs involving cognitive function                Time: 3235-5732 OT Time Calculation (min): 18 min Charges:  OT General Charges $OT Visit: 1 Visit OT Evaluation $OT Eval Moderate Complexity: 1 Mod  11/17/2020  RP, OTR/L  Acute Rehabilitation Services  Office:  [phone]   Metta Clines 11/17/2020, 12:08 PM

## 2020-11-17 NOTE — Progress Notes (Signed)
CARDIAC REHAB PHASE I   PRE:  Rate/Rhythm: SR / HR 84  BP:  Sitting: 115/56      SaO2: 94  MODE:  Ambulation: 200 ft (SaO2 = 94%)  POST:  Rate/Rhythm: SR / HR 91  BP:  Sitting: 125/60      SaO2: 95   Pt ambulated in hallway with slow, steady gait using the rolling walker. Pt denied dizziness, or pain. Pt voices some SOB, SaO2 = 94%. Pt back to chair with brake on and call bell at side. Encouraged OOB to chair. Pt achieved 1250 mL on the I/S. CABG/OHS education provided to the patient today. Reviewed the OHS booklet, pt voices reading through it already; encouraged to read again. Stressed the "move in the tube" concept. Encouraged pt to use walker and shower chair at home for safety. Reviewed wound care and S/S to report to Dr. Kipp Brood. Stressed for pt to keep all follow up appts with his doctors. Pt voices he utilizes the [facility] to get to his appointments. Pt voices he has a friend who lives in his neighborhood who will help him when he gets home.  Reviewed exercise guidelines, RPE and when to terminate exercise; when to call MD vs 911. Encouraged daily weights and S/S of fluid overload. Reviewed heart healthy diet and low Na diet; handouts provided. Pt will be referred to [facility] for CRP2 program.  Pt verbalized understanding of the education provided.   Lesly Rubenstein, MS, ACSM EP-C, [facility] 11/17/2020 09:40 - 10:54

## 2020-11-17 NOTE — Progress Notes (Addendum)
[address]       [place] [address]             [phone]      4 Days Post-Op Procedure(s) (LRB): CORONARY ARTERY BYPASS GRAFTING (CABG) x 4 ON CARDIOPULMONARY BYPASS USING LIMA AND RIGHT GSV. -LIMA to LAD -SVG to OM -SVG to PDA -SVG to DIAGONAL (N/A) TRANSESOPHAGEAL ECHOCARDIOGRAM (TEE) (N/A) ENDOVEIN HARVEST OF GREATER SAPHENOUS VEIN (Right) APPLICATION OF CELL SAVER (N/A) Subjective: Out walking in the hall unassisted this morning.  Says he feels okay but has poor appetite.  On room air, sats have been okay. BM x2 yesterday.  Objective: Vital signs in last 24 hours: Temp:  [98 F (36.7 C)-99.3 F (37.4 C)] 98.6 F (37 C) (10/08 0419) Pulse Rate:  [84-97] 84 (10/08 0419) Cardiac Rhythm: Normal sinus rhythm (10/08 0828) Resp:  [20-24] 23 (10/08 0419) BP: (98-118)/(49-84) 106/59 (10/08 0419) SpO2:  [90 %-98 %] 92 % (10/08 0419) Weight:  [89.7 kg] 89.7 kg (10/08 0419)    Intake/Output from previous day: 10/07 0701 - 10/08 0700 In: 840 [P.O.:840] Out: -  Intake/Output this shift: No intake/output data recorded.  General appearance: alert, cooperative, and no distress Lungs: Breath sounds are clear.   Abdomen: soft, non-tender Extremities: extremities warm and well-perfused.  No lower extremity edema Wounds: The sternotomy incision and left LE EVH incisions are clean and dry  Lab Results: Recent Labs    11/15/20 0403 11/16/20 0151  WBC 14.1* 11.2*  HGB 9.0* 8.7*  HCT 28.5* 26.9*  PLT 120* 133*    BMET:  Recent Labs    11/15/20 0403 11/16/20 0151  NA 132* 129*  K 4.8 4.5  CL 102 99  CO2 24 24  GLUCOSE 143* 142*  BUN 25* 30*  CREATININE 0.99 1.02  CALCIUM 8.1* 8.2*     PT/INR:  No results for input(s): LABPROT, INR in the last 72 hours.  ABG    Component Value Date/Time   PHART 7.363 11/14/2020 1013   HCO3 19.1 (L) 11/14/2020 1013   TCO2 20 (L) 11/14/2020 1013   ACIDBASEDEF 6.0 (H) 11/14/2020 1013   O2SAT 99.0  11/14/2020 1013   CBG (last 3)  Recent Labs    11/16/20 1651 11/16/20 2113 11/17/20 0629  GLUCAP 149* 145* 127*     Assessment/Plan: S/P Procedure(s) (LRB): CORONARY ARTERY BYPASS GRAFTING (CABG) x 4 ON CARDIOPULMONARY BYPASS USING LIMA AND RIGHT GSV. -LIMA to LAD -SVG to OM -SVG to PDA -SVG to DIAGONAL (N/A) TRANSESOPHAGEAL ECHOCARDIOGRAM (TEE) (N/A) ENDOVEIN HARVEST OF GREATER SAPHENOUS VEIN (Right) APPLICATION OF CELL SAVER (N/A)  CV- NSR in the 80- 90s, BP improving, now with SBP 105-120, has been on midodrine 10mg  TID, will decrease to 7.5mg  TID. Continue low-dose metoprolol. Continue asa/plavix/statin Pulm- now on RA with good oxygen saturation. Renal-creatinine 1.02, electrolytes okay. Weight is about 8 kg above preop if accurate.  Continue lasix 40mg  daily with potassium replacement.  H and H stable has been stable.  No new labs today Endo- blood glucose well controlled, continue current regimen   Plan: Progressing well.  He lives alone and was hoping to continue rehab and CIR but is functioning at a level too high for admission to that unit.  PT has recommended SNF.  He strongly prefers to return home.  He has a close neighbor in the apartment complex where he lives with nursing assistant training who has offered to assist him on a daily basis.  We will consider this along with home health nursing services.  LOS: 11 days    Antony Odea, [place] [phone] 11/17/2020  Patient seen and examined, agree with above  Remo Lipps C. Roxan Hockey, MD [facility] [phone]

## 2020-11-18 ENCOUNTER — Inpatient Hospital Stay (HOSPITAL_COMMUNITY): Payer: Medicare Other

## 2020-11-18 LAB — GLUCOSE, CAPILLARY
Glucose-Capillary: 137 mg/dL — ABNORMAL HIGH (ref 70–99)
Glucose-Capillary: 114 mg/dL — ABNORMAL HIGH (ref 70–99)
Glucose-Capillary: 156 mg/dL — ABNORMAL HIGH (ref 70–99)
Glucose-Capillary: 163 mg/dL — ABNORMAL HIGH (ref 70–99)

## 2020-11-18 MED ORDER — GUAIFENESIN ER 600 MG PO TB12
600.0000 mg | ORAL_TABLET | Freq: Two times a day (BID) | ORAL | Status: DC
  Administered 2020-11-18 – 2020-11-20 (×5): 600 mg via ORAL
  Filled 2020-11-18 (×5): qty 1

## 2020-11-18 MED ORDER — MIDODRINE HCL 5 MG PO TABS
5.0000 mg | ORAL_TABLET | Freq: Three times a day (TID) | ORAL | Status: DC
  Administered 2020-11-18 (×2): 5 mg via ORAL
  Filled 2020-11-18 (×2): qty 1

## 2020-11-18 NOTE — Social Work (Signed)
  Re: Sherley Bounds Date of Birth: September 30, 1945 Date: 11/18/20   To Whom It May Concern:   Please be advised that the above-named patient will require a short-term nursing home stay-anticipated 30 days or less for rehabilitation and strengthening. The plan is to return home.

## 2020-11-18 NOTE — TOC Progression Note (Signed)
Transition of Care [facility]) - Progression Note    Patient Details  Name: Ricky Abbott MRN: [id number] Date of Birth: 06-May-1945  Transition of [facility]) CM/SW [place], [place] Phone Number: [phone] 11/18/2020, 4:29 PM  Clinical Narrative:     CSW attempted to start authorization through [place] and was unable to pull pt up pt through portal. CSW spoke with [facility] and they began the authorization. CSW uploaded clinicals. Navi# [id number].   Pt's PASRR is pending and 30 day note will be need to be signed by MD.  [facility] team will continue to assist with discharge planning needs.   Expected Discharge Plan: [place] Barriers to Discharge: Continued Medical Work up  Expected Discharge Plan and Services Expected Discharge Plan: [place] In-house Referral: Clinical Social Work Discharge Planning Services: CM Consult Post Acute Care Choice: [place] arrangements for the past 2 months: Apartment                                       Social Determinants of Health (SDOH) Interventions    Readmission Risk Interventions Readmission Risk Prevention Plan 11/14/2020  Transportation Screening Complete  PCP or Specialist Appt within 3-5 Days Complete  HRI or [place] Complete  Social Work Consult for [place] Planning/Counseling Complete  Palliative Care Screening Not Applicable  Medication Review Press photographer) Complete  Some recent data might be hidden

## 2020-11-18 NOTE — Progress Notes (Signed)
Mobility Specialist Progress Note:   11/18/20 1131  Therapy Vitals  Temp (!) 97.5 F (36.4 C)  Temp Source Oral  Pulse Rate 70  Resp 20  BP 127/79  Patient Position (if appropriate) Sitting  Oxygen Therapy  SpO2 100 %  O2 Device Room Air  Mobility  Activity Ambulated in hall  Level of Assistance Standby assist, set-up cues, supervision of patient - no hands on  Assistive Device Front wheel walker  Distance Ambulated (ft) 362 ft  Mobility Ambulated with assistance in hallway  Mobility Response Tolerated well  Mobility performed by Mobility specialist  Bed Position Chair  $Mobility charge 1 Mobility   Pre- Mobility:  72 HR;  127/79 BP; 99% SpO2 Post Mobility:   81 HR;  145/67 BP;  97% SpO2  Pt received in chair and willing to participate in mobility. Went over sternal precautions with patient again before standing. Pt stated he pain was 5/10 but otherwise asx. Returned to chair with call bell in reach and all needs met. Encouraged use of IS.    [facility] Health and safety inspector Phone [phone]

## 2020-11-18 NOTE — NC FL2 (Addendum)
[place] LEVEL OF CARE SCREENING TOOL     IDENTIFICATION  Patient Name: Ricky Abbott Birthdate: 1945-06-12 Sex: male Admission Date (Current Location): 11/06/2020  [facility] and [place] Number:  Herbalist and Address:  [facility] [place], [place] [address], [place], [place] [address]      Provider Number: [id number]  Attending Physician Name and Address:  Lajuana Matte, MD  Relative Name and Phone Number:  Fritz Pickerel [phone]    Current Level of Care: Hospital Recommended Level of Care: [place] Prior Approval Number:    Date Approved/Denied:   PASRR Number: PASRR under review  Discharge Plan: SNF    Current Diagnoses: Patient Active Problem List   Diagnosis Date Noted   Coronary artery disease 11/13/2020   Malnutrition of moderate degree 11/09/2020   Chest pain 11/06/2020   Unstable angina ([place]) 10/06/2019   Cholecystitis    Urinary hesitancy due to benign prostatic hyperplasia 05/17/2016   Cervical spondylosis with radiculopathy 05/17/2016   VBI (vertebrobasilar insufficiency) 05/17/2016   Pre-operative cardiovascular examination 04/28/2016   Medication management 04/28/2016   History of alcohol abuse 04/15/2016   Poor diet 04/15/2016   Chronic post-traumatic stress disorder (PTSD) 05/23/2015   DM type 2 with diabetic peripheral neuropathy ([place]) 05/21/2015   Erectile dysfunction 02/19/2015   COPD, mild ([place]) 03/08/2014   Dyspnea on exertion 03/02/2014   PTSD (post-traumatic stress disorder) 12/16/2013   Sarcoidosis 12/15/2013   History of hepatitis C virus infection 12/15/2013   S/P coronary artery stent placement 09/04/2013   Other chest pain 09/04/2013   CAD, multiple vessel 08/15/2013   Hyperlipidemia 08/15/2013   Essential hypertension 08/15/2013   Other fatigue 08/15/2013   Tubular adenoma of colon 08/15/2013   Need for tetanus booster 08/15/2013    Orientation RESPIRATION BLADDER  Height & Weight     Self, Time, Situation, Place  Normal Continent Weight: 187 lb 2.7 oz (84.9 kg) Height:  5\' 11"  (180.3 cm)  BEHAVIORAL SYMPTOMS/MOOD NEUROLOGICAL BOWEL NUTRITION STATUS      Continent (WDL) Diet (Please see discharge summary)  AMBULATORY STATUS COMMUNICATION OF NEEDS Skin   Limited Assist Verbally Other (Comment) (Incision closed Leg Right, Incision closed chest other)                       Personal Care Assistance Level of Assistance  Bathing, Feeding, Dressing Bathing assistance Limited assistance Feeding assistance: Independent Dressing assistance Limited assistance     Functional Limitations Info  Sight, Hearing, Speech Sight Info: Impaired Hearing Info: Impaired Speech Info: Adequate    SPECIAL CARE FACTORS FREQUENCY  PT (By licensed PT), OT (By licensed OT)     PT Frequency: 5x min weekly OT Frequency: 5x min weekly            Contractures Contractures Info: Not present    Additional Factors Info  Code Status, Allergies, Insulin Sliding Scale, Psychotropic Code Status Info: FULL Allergies Info: Peanut-containing Drug Products Psychotropic Info: sertraline (ZOLOFT) tablet 100 mg daily Insulin Sliding Scale Info: insulin aspart (novoLOG) injection 0-24 Units 3 times daily before meals       Current Medications (11/18/2020):  This is the current hospital active medication list Current Facility-Administered Medications  Medication Dose Route Frequency Provider Last Rate Last Admin   0.9 %  sodium chloride infusion  250 mL Intravenous PRN Gold, Wayne E, PA-C       acetaminophen (TYLENOL) tablet 1,000 mg  1,000  mg Oral Q6H Gold, Wayne E, PA-C   1,000 mg at 11/18/20 1250   Or   acetaminophen (TYLENOL) 160 MG/5ML solution 1,000 mg  1,000 mg Per Tube Q6H Gold, Wayne E, PA-C   1,000 mg at 11/14/20 3500   aspirin EC tablet 81 mg  81 mg Oral Daily Gold, Wilder Glade, PA-C   81 mg at 11/18/20 1023   bisacodyl (DULCOLAX) EC tablet 10 mg  10 mg Oral  Daily Gold, Wayne E, PA-C   10 mg at 11/18/20 1024   Or   bisacodyl (DULCOLAX) suppository 10 mg  10 mg Rectal Daily Gold, Wayne E, PA-C       clopidogrel (PLAVIX) tablet 75 mg  75 mg Oral Daily Gold, Wayne E, PA-C   75 mg at 11/18/20 1024   cyclobenzaprine (FLEXERIL) tablet 10 mg  10 mg Oral TID PRN Jadene Pierini E, PA-C   10 mg at 11/12/20 0851   enoxaparin (LOVENOX) injection 40 mg  40 mg Subcutaneous QHS Gold, Patrick Jupiter E, PA-C   40 mg at 11/17/20 2115   ezetimibe (ZETIA) tablet 10 mg  10 mg Oral Daily Gold, Wayne E, PA-C   10 mg at 11/18/20 1024   feeding supplement (ENSURE ENLIVE / ENSURE PLUS) liquid 237 mL  237 mL Oral BID BM Gold, Wayne E, PA-C   237 mL at 11/18/20 1025   ferrous [id number] C-folic acid (TRINSICON / FOLTRIN) capsule 1 capsule  1 capsule Oral BID PC Gold, Wayne E, PA-C   1 capsule at 11/18/20 1024   furosemide (LASIX) tablet 40 mg  40 mg Oral Daily Conte, Tessa N, PA-C   40 mg at 11/18/20 1024   guaiFENesin (MUCINEX) 12 hr tablet 600 mg  600 mg Oral BID Roddenberry, Myron G, PA-C   600 mg at 11/18/20 1023   insulin aspart (novoLOG) injection 0-24 Units  0-24 Units Subcutaneous TID AC Lajuana Matte, MD   2 Units at 11/18/20 0630   levalbuterol (XOPENEX) nebulizer solution 0.63 mg  0.63 mg Nebulization Q8H PRN Gold, Wayne E, PA-C       metoprolol tartrate (LOPRESSOR) tablet 12.5 mg  12.5 mg Oral BID Gold, Wayne E, PA-C   12.5 mg at 11/18/20 1024   Or   metoprolol tartrate (LOPRESSOR) 25 mg/10 mL oral suspension 12.5 mg  12.5 mg Per Tube BID Gold, Wayne E, PA-C       metoprolol tartrate (LOPRESSOR) injection 2.5-5 mg  2.5-5 mg Intravenous Q2H PRN Gold, Wayne E, PA-C       midodrine (PROAMATINE) tablet 5 mg  5 mg Oral TID WC Roddenberry, Myron G, PA-C   5 mg at 11/18/20 1024   multivitamin with minerals tablet 1 tablet  1 tablet Oral Daily Gold, Wayne E, PA-C   1 tablet at 11/18/20 1024   ondansetron (ZOFRAN) injection 4 mg  4 mg Intravenous Q6H PRN Jadene Pierini E,  PA-C   4 mg at 11/15/20 1259   oxyCODONE (Oxy IR/ROXICODONE) immediate release tablet 5-10 mg  5-10 mg Oral Q3H PRN Jadene Pierini E, PA-C   10 mg at 11/18/20 1251   pantoprazole (PROTONIX) EC tablet 40 mg  40 mg Oral Daily Gold, Wayne E, PA-C   40 mg at 11/18/20 1024   polyethylene glycol (MIRALAX / GLYCOLAX) packet 17 g  17 g Oral Daily Gold, Wayne E, PA-C   17 g at 11/18/20 1023   potassium chloride (KLOR-CON) CR tablet 20 mEq  20 mEq Oral Daily Harriet Pho,  Tessa N, PA-C   20 mEq at 11/18/20 1024   rosuvastatin (CRESTOR) tablet 40 mg  40 mg Oral Daily Gold, Wayne E, PA-C   40 mg at 11/18/20 1024   sertraline (ZOLOFT) tablet 100 mg  100 mg Oral Daily Gold, Wayne E, PA-C   100 mg at 11/18/20 1024   sodium chloride flush (NS) 0.9 % injection 3 mL  3 mL Intravenous Q12H Gold, Wayne E, PA-C   3 mL at 11/18/20 1025   sodium chloride flush (NS) 0.9 % injection 3 mL  3 mL Intravenous PRN Gold, Wayne E, PA-C       tamsulosin (FLOMAX) capsule 0.4 mg  0.4 mg Oral QPC supper Gold, Wayne E, PA-C   0.4 mg at 11/17/20 1646   traMADol (ULTRAM) tablet 50-100 mg  50-100 mg Oral Q4H PRN Jadene Pierini E, PA-C   100 mg at 11/15/20 6691   Facility-Administered Medications Ordered in Other Encounters  Medication Dose Route Frequency Provider Last Rate Last Admin   sodium chloride flush (NS) 0.9 % injection 3 mL  3 mL Intravenous Q12H Marrianne Mood D, PA-C         Discharge Medications: Please see discharge summary for a list of discharge medications.  Relevant Imaging Results:  Relevant Lab Results:   Additional Information [phone], Both Covid Vaccines,Boostedx2  Milas Gain, LCSWA

## 2020-11-18 NOTE — Progress Notes (Addendum)
[address] City[address]       [place] [address]             [phone]      5 Days Post-Op Procedure(s) (LRB): CORONARY ARTERY BYPASS GRAFTING (CABG) x 4 ON CARDIOPULMONARY BYPASS USING LIMA AND RIGHT GSV. -LIMA to LAD -SVG to OM -SVG to PDA -SVG to DIAGONAL (N/A) TRANSESOPHAGEAL ECHOCARDIOGRAM (TEE) (N/A) ENDOVEIN HARVEST OF GREATER SAPHENOUS VEIN (Right) APPLICATION OF CELL SAVER (N/A) Subjective:  Awake and alert.  Says he had a lot of upper airway congestion last evening and coughed up thick phlegm.  He also had a brief nosebleed after blowing his nose vigorously.  This stopped after few minutes and has not recurred.  On room air, sats have been okay.   Objective: Vital signs in last 24 hours: Temp:  [97.5 F (36.4 C)-99.6 F (37.6 C)] 97.6 F (36.4 C) (10/09 0749) Pulse Rate:  [72-85] 72 (10/09 0749) Cardiac Rhythm: Normal sinus rhythm (10/09 0825) Resp:  [17-20] 17 (10/09 0749) BP: (95-128)/(45-77) 95/45 (10/09 0749) SpO2:  [96 %-100 %] 100 % (10/09 0749) Weight:  [84.9 kg] 84.9 kg (10/09 0347)    Intake/Output from previous day: 10/08 0701 - 10/09 0700 In: 240 [P.O.:240] Out: -  Intake/Output this shift: No intake/output data recorded.  General appearance: alert, cooperative, and no distress Lungs: Breath sounds are clear.  Cough does not sound congested this morning. Abdomen: soft, non-tender Extremities: extremities warm and well-perfused.  No lower extremity edema Wounds: The sternotomy incision and left LE EVH incisions are clean and dry  Lab Results: Recent Labs    11/16/20 0151  WBC 11.2*  HGB 8.7*  HCT 26.9*  PLT 133*    BMET:  Recent Labs    11/16/20 0151  NA 129*  K 4.5  CL 99  CO2 24  GLUCOSE 142*  BUN 30*  CREATININE 1.02  CALCIUM 8.2*     PT/INR:  No results for input(s): LABPROT, INR in the last 72 hours.  ABG    Component Value Date/Time   PHART 7.363 11/14/2020 1013   HCO3 19.1 (L) 11/14/2020 1013   TCO2  20 (L) 11/14/2020 1013   ACIDBASEDEF 6.0 (H) 11/14/2020 1013   O2SAT 99.0 11/14/2020 1013   CBG (last 3)  Recent Labs    11/17/20 1632 11/17/20 2103 11/18/20 0608  GLUCAP 148* 169* 137*     Assessment/Plan: S/P Procedure(s) (LRB): CORONARY ARTERY BYPASS GRAFTING (CABG) x 4 ON CARDIOPULMONARY BYPASS USING LIMA AND RIGHT GSV. -LIMA to LAD -SVG to OM -SVG to PDA -SVG to DIAGONAL (N/A) TRANSESOPHAGEAL ECHOCARDIOGRAM (TEE) (N/A) ENDOVEIN HARVEST OF GREATER SAPHENOUS VEIN (Right) APPLICATION OF CELL SAVER (N/A)  CV- NSR in the 80- 90s, BP continues to improve.  We will decrease the midodrine to 5 mg p.o. TID and continue low-dose metoprolol. Continue asa/plavix/statin Pulm- now on RA with good oxygen saturation.  Reported congested cough late yesterday with phlegm production.  Will provide Mucinex twice daily.  We will check follow-up chest x-ray today. Renal-last creatinine 1.02, electrolytes okay. Weight is down about 5 kg from yesterday but doubt this is accurate.  Continue lasix 40mg  daily with potassium replacement.  Recheck BMP in a.m. H and H stable has been stable.  No new labs today Endo- blood glucose well controlled, continue current regimen   Plan: Progressing well.  He lives alone and was hoping to continue rehab and CIR but is functioning at a level too high  for admission to that unit.  PT has recommended SNF.  He strongly prefers to return home.  He has a close neighbor in the apartment complex where he lives with nursing assistant training who has offered to assist him on a daily basis.  We will consider this along with home health nursing services.  Anticipate he will be ready for discharge tomorrow   LOS: 12 days    Ricky Abbott [phone] 11/18/2020  Patient seen and examined, agree with above Plan home tomorrow  Revonda Standard. Roxan Hockey, MD [facility] [phone]

## 2020-11-18 NOTE — TOC Initial Note (Signed)
Transition of Care [facility]) - Initial/Assessment Note    Patient Details  Name: Ricky Abbott MRN: [id number] Date of Birth: 1945/06/08  Transition of Care [facility]) CM/SW Contact:    Milas Gain, [place] Phone Number: 11/18/2020, 3:01 PM  Clinical Narrative:                  CSW received consult for possible SNF placement at time of discharge. CSW spoke with patient regarding PT recommendation of SNF placement at time of discharge. Patient comes from home alone.Patient expressed understanding of PT recommendation and is agreeable to SNF placement at time of discharge. Patient gave CSW permission to fax out initial referral near the [place] area. CSW discussed insurance authorization process with patient.Patient has received the COVID vaccines as well as boosted x2. No further questions reported at this time. CSW to continue to follow and assist with discharge planning needs.   Expected Discharge Plan: Skilled Nursing Facility Barriers to Discharge: Continued Medical Work up   Patient Goals and CMS Choice Patient states their goals for this hospitalization and ongoing recovery are:: SNF CMS Medicare.gov Compare Post Acute Care list provided to:: Patient Choice offered to / list presented to : Patient  Expected Discharge Plan and Services Expected Discharge Plan: Skilled Nursing Facility In-house Referral: Clinical Social Work Discharge Planning Services: CM Consult Post Acute Care Choice: [place] arrangements for the past 2 months: Apartment                                      Prior Living Arrangements/Services Living arrangements for the past 2 months: Apartment Lives with:: Self Patient language and need for interpreter reviewed:: Yes Do you feel safe going back to the place where you live?: No   SNF  Need for Family Participation in Patient Care: Yes (Comment) Care giver support system in place?: Yes (comment)   Criminal Activity/Legal Involvement  Pertinent to Current Situation/Hospitalization: No - Comment as needed  Activities of Daily Living Home Assistive Devices/Equipment: Eyeglasses, Blood pressure cuff ADL Screening (condition at time of admission) Patient's cognitive ability adequate to safely complete daily activities?: Yes Is the patient deaf or have difficulty hearing?: Yes Does the patient have difficulty seeing, even when wearing glasses/contacts?: Yes Does the patient have difficulty concentrating, remembering, or making decisions?: No Patient able to express need for assistance with ADLs?: Yes Does the patient have difficulty dressing or bathing?: No Independently performs ADLs?: Yes (appropriate for developmental age) Does the patient have difficulty walking or climbing stairs?: Yes Weakness of Legs: Both (gets SOB and legs burn when going up steps) Weakness of Arms/Hands: None  Permission Sought/Granted Permission sought to share information with : Case Manager, Family Supports, Chartered certified accountant granted to share information with : Yes, Verbal Permission Granted  Share Information with NAME: Fritz Pickerel  Permission granted to share info w AGENCY: SNF  Permission granted to share info w Relationship: relative  Permission granted to share info w Contact Information: Fritz Pickerel [phone]  Emotional Assessment Appearance:: Appears stated age Attitude/Demeanor/Rapport: Gracious Affect (typically observed): Calm Orientation: : Oriented to Self, Oriented to Place, Oriented to  Time, Oriented to Situation Alcohol / Substance Use: Not Applicable Psych Involvement: No (comment)  Admission diagnosis:  Unstable angina ([place]) [I20.0] Coronary artery disease [I25.10] Patient Active Problem List   Diagnosis Date Noted   Coronary artery disease 11/13/2020   Malnutrition of moderate degree 11/09/2020  Chest pain 11/06/2020   Unstable angina ([place]) 10/06/2019   Cholecystitis    Urinary hesitancy due to  benign prostatic hyperplasia 05/17/2016   Cervical spondylosis with radiculopathy 05/17/2016   VBI (vertebrobasilar insufficiency) 05/17/2016   Pre-operative cardiovascular examination 04/28/2016   Medication management 04/28/2016   History of alcohol abuse 04/15/2016   Poor diet 04/15/2016   Chronic post-traumatic stress disorder (PTSD) 05/23/2015   DM type 2 with diabetic peripheral neuropathy ([place]) 05/21/2015   Erectile dysfunction 02/19/2015   COPD, 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   Dyspnea on exertion 03/02/2014   PTSD (post-traumatic stress disorder) 12/16/2013   Sarcoidosis 12/15/2013   History of hepatitis C virus infection 12/15/2013   S/P coronary artery stent placement 09/04/2013   Other chest pain 09/04/2013   CAD, multiple vessel 08/15/2013   Hyperlipidemia 08/15/2013   Essential hypertension 08/15/2013   Other fatigue 08/15/2013   Tubular adenoma of colon 08/15/2013   Need for tetanus booster 08/15/2013   PCP:  Theotis Burrow, MD Pharmacy:   [facility]  ([place], [place] [facility] [address] [place] [address] Phone: [phone] Fax: [phone]  [id number] - [place], [place] - [address] [place] [address] Phone: [phone] Fax: [phone]  [facility]) [place], [place] [address] Phone: [phone] Fax: [phone]     Social Determinants of Health ([place]) Interventions    Readmission Risk Interventions Readmission Risk Prevention Plan 11/14/2020  Transportation Screening Complete  PCP or Specialist Appt within 3-5 Days Complete  HRI or Home Care Consult Complete  Social Work Consult for [place] Planning/Counseling Complete  Palliative Care Screening Not Applicable  Medication Review Press photographer) Complete  Some recent data might be hidden

## 2020-11-19 ENCOUNTER — Other Ambulatory Visit: Payer: Self-pay | Admitting: *Deleted

## 2020-11-19 LAB — CBC
HCT: 27.7 % — ABNORMAL LOW (ref 39.0–52.0)
Hemoglobin: 8.7 g/dL — ABNORMAL LOW (ref 13.0–17.0)
MCH: 27.4 pg (ref 26.0–34.0)
MCHC: 31.4 g/dL (ref 30.0–36.0)
MCV: 87.4 fL (ref 80.0–100.0)
Platelets: 224 10*3/uL (ref 150–400)
RBC: 3.17 MIL/uL — ABNORMAL LOW (ref 4.22–5.81)
RDW: 15.4 % (ref 11.5–15.5)
WBC: 6.9 10*3/uL (ref 4.0–10.5)
nRBC: 0 % (ref 0.0–0.2)

## 2020-11-19 LAB — BASIC METABOLIC PANEL
Anion gap: 8 (ref 5–15)
BUN: 24 mg/dL — ABNORMAL HIGH (ref 8–23)
CO2: 27 mmol/L (ref 22–32)
Calcium: 8.5 mg/dL — ABNORMAL LOW (ref 8.9–10.3)
Chloride: 102 mmol/L (ref 98–111)
Creatinine, Ser: 0.95 mg/dL (ref 0.61–1.24)
GFR, Estimated: >60 mL/min (ref >60)
Glucose, Bld: 165 mg/dL — ABNORMAL HIGH (ref 70–99)
Potassium: 5 mmol/L (ref 3.5–5.1)
Sodium: 137 mmol/L (ref 135–145)

## 2020-11-19 LAB — GLUCOSE, CAPILLARY
Glucose-Capillary: 131 mg/dL — ABNORMAL HIGH (ref 70–99)
Glucose-Capillary: 148 mg/dL — ABNORMAL HIGH (ref 70–99)
Glucose-Capillary: 112 mg/dL — ABNORMAL HIGH (ref 70–99)
Glucose-Capillary: 127 mg/dL — ABNORMAL HIGH (ref 70–99)

## 2020-11-19 MED ORDER — MIDODRINE HCL 5 MG PO TABS
2.5000 mg | ORAL_TABLET | Freq: Three times a day (TID) | ORAL | Status: DC
  Administered 2020-11-19 – 2020-11-20 (×4): 2.5 mg via ORAL
  Filled 2020-11-19 (×4): qty 1

## 2020-11-19 NOTE — TOC Progression Note (Addendum)
Transition of Care [facility]) - Progression Note    Patient Details  Name: Ricky Abbott MRN: [id number] Date of Birth: [birth year]  Transition of Care [facility]) CM/SW [place], [place] Phone Number: 11/19/2020, 4:33 PM  Clinical Narrative:     PSARR remains pending- patient can not discharge to SNF until PASRR is received. CSW updated clinicals.   Received call from [facility] [place]- patient is approved for SNF- they need facility- will call CSW back tomorrow for SNF choice.   CSW called [facility] and [facility] - requested they review  Story - left voice message  [place] - made offer  CSW met with patient to discuss PT/OT recommendation of short term rehab. CSW advised he was ambulating per mobility note 340 ft and appeared to be independent in the room, not sure if insurance was going to authorize SNF placement. However, while with patient, insurance called CSW, saying they have authorized SNF placement. CSW informed patient- SNF was approved but his only bed offer was [facility] However, while talking with insurance they confirmed [name] is not in network. Patient, understandably so, had became a little frustrated, indicating, he was told insurance was not going to authorize and [name] was not in network. CSW explained wanted to be transparent that his insurance may not, so he could think about another plan if needed. Patient states his preference is to discharge home anyway, he no longer wanted SNF. Informed patient of bed offer with [facility]. CSW explained he can take sometime to weigh his options, now that we know he has been approved for short tern rehab. Patient adamantly states, he wants to go home. His friend, [name] told him " I got you", so I want to go home.  CSW apologized  for any confusion or stress this process may have caused. Patient accepted CSW apology.   He requested rolling walker w/seat. He left voice  message with his friend for [name] agency choice. He states he will let us know of his choice when she returns call.  TOC will continue to follow and assist with discharge planning.   [name], MSW, LCSW Clinical Social Worker     Expected Discharge Plan: Skilled Nursing Facility Barriers to Discharge: Continued Medical Work up  Expected Discharge Plan and Services Expected Discharge Plan: [place] In-house Referral: Clinical Social Work Discharge Planning Services: CM Consult Post Acute Care Choice: [place] arrangements for the past 2 months: Apartment                                       Social Determinants of Health (SDOH) Interventions    Readmission Risk Interventions Readmission Risk Prevention Plan 11/14/2020  Transportation Screening Complete  PCP or Specialist Appt within 3-5 Days Complete  HRI or [place] Complete  Social Work Consult for [place] Planning/Counseling Complete  Palliative Care Screening Not Applicable  Medication Review Press photographer) Complete  Some recent data might be hidden

## 2020-11-19 NOTE — Progress Notes (Signed)
CARDIAC REHAB PHASE I   PRE:  Rate/Rhythm: 95 SR    BP: sitting 126/69    SaO2: 96 RA  MODE:  Ambulation: 200 ft   POST:  Rate/Rhythm: 110 ST    BP: sitting 126/69     SaO2: 95 RA  Pt up in recliner. Stood with rocking and walked with RW. Steady. C/o general weakness and fatigue with distance. Rest x1. Did not feel he could walk farther. VSS back in recliner. Encouraged IS and x2 more walks. [phone]   [place], ACSM 11/19/2020 9:57 AM

## 2020-11-19 NOTE — Progress Notes (Signed)
Mobility Specialist Progress Note:   11/19/20 1420  Therapy Vitals  Pulse Rate 84  BP (!) 124/41  Mobility  Activity Ambulated in hall  Level of Assistance Independent  Assistive Device None  Distance Ambulated (ft) 340 ft  Mobility Ambulated independently in hallway  Mobility Response Tolerated well  Mobility performed by Mobility specialist  Bed Position Chair  $Mobility charge 1 Mobility   Pre Mobility: HR 84 bpm; BP 124/41 During Mobility: HR 97 bpm Post Mobility: HR 88 bpm; BP 123/66  Pt was received in bed, agreed to mobility. Ambulated in hall 73' with not AD and supervision. Required x1 standing break at 170' d/t SOB. As distance increased, pt began wheezing. Stressed use of IS. Left in chair with call bell in reach.   Nelta Numbers Mobility Specialist  Phone [phone]

## 2020-11-19 NOTE — Progress Notes (Addendum)
[address]       [place] [address]             [phone]      6 Days Post-Op Procedure(s) (LRB): CORONARY ARTERY BYPASS GRAFTING (CABG) x 4 ON CARDIOPULMONARY BYPASS USING LIMA AND RIGHT GSV. -LIMA to LAD -SVG to OM -SVG to PDA -SVG to DIAGONAL (N/A) TRANSESOPHAGEAL ECHOCARDIOGRAM (TEE) (N/A) ENDOVEIN HARVEST OF GREATER SAPHENOUS VEIN (Right) APPLICATION OF CELL SAVER (N/A) Subjective:  Awake and alert.  Says he is still having some upper airway congestion.   No further nosebleeds.     On room air, sats have been okay and he is fairly independent with ambulation.    Objective: Vital signs in last 24 hours: Temp:  [97.5 F (36.4 C)-98.9 F (37.2 C)] 98.9 F (37.2 C) (10/10 0759) Pulse Rate:  [70-97] 90 (10/10 0759) Cardiac Rhythm: Normal sinus rhythm (10/09 1952) Resp:  [15-25] 25 (10/10 0759) BP: (112-127)/(50-79) 120/60 (10/10 0759) SpO2:  [90 %-100 %] 91 % (10/10 0759) Weight:  [85 kg] 85 kg (10/10 0525)    Intake/Output from previous day: 10/09 0701 - 10/10 0700 In: 960 [P.O.:960] Out: -  Intake/Output this shift: No intake/output data recorded.  General appearance: alert, cooperative, and no distress Lungs: Breath sounds are clear. Mildly congested cough.  Abdomen: soft, non-tender Extremities: extremities warm and well-perfused.  No lower extremity edema Wounds: The sternotomy incision and left LE EVH incisions are clean and dry  Lab Results: Recent Labs    11/19/20 0107  WBC 6.9  HGB 8.7*  HCT 27.7*  PLT 224    BMET:  Recent Labs    11/19/20 0107  NA 137  K 5.0  CL 102  CO2 27  GLUCOSE 165*  BUN 24*  CREATININE 0.95  CALCIUM 8.5*     PT/INR:  No results for input(s): LABPROT, INR in the last 72 hours.  ABG    Component Value Date/Time   PHART 7.363 11/14/2020 1013   HCO3 19.1 (L) 11/14/2020 1013   TCO2 20 (L) 11/14/2020 1013   ACIDBASEDEF 6.0 (H) 11/14/2020 1013   O2SAT 99.0 11/14/2020 1013   CBG (last  3)  Recent Labs    11/18/20 1630 11/18/20 2054 11/19/20 0556  GLUCAP 156* 163* 131*     Assessment/Plan: S/P Procedure(s) (LRB): CORONARY ARTERY BYPASS GRAFTING (CABG) x 4 ON CARDIOPULMONARY BYPASS USING LIMA AND RIGHT GSV. -LIMA to LAD -SVG to OM -SVG to PDA -SVG to DIAGONAL (N/A) TRANSESOPHAGEAL ECHOCARDIOGRAM (TEE) (N/A) ENDOVEIN HARVEST OF GREATER SAPHENOUS VEIN (Right) APPLICATION OF CELL SAVER (N/A)  CV- NSR in the 80- 90s, BP remains stable.  We will decrease the midodrine to 2.5 mg p.o. TID and continue low-dose metoprolol. Continue asa/plavix/statin Pulm- remains on RA with good oxygen saturation.  Continue  Mucinex twice daily for sensation of airway congestion.  Cough is only mildly productive. CXR looks good-> mild bibasilar ATX, trace effusions, no infiltrates.  Renal-last creatinine 0.95, K+ 5.0. Appears euvolemic on exam. Will stop Lasix and KCl. H and H stable.   Endo- blood glucose well controlled, continue current regimen   Plan: Progressing well.  He lives alone and was hoping to continue rehab and CIR but is functioning at a level too high for admission to that unit.  PT has recommended SNF and he now agrees to this. CM team working on placement.    LOS: 13 days    Antony Odea, [place] [phone] 11/19/2020  Agree with above [place]

## 2020-11-19 NOTE — Progress Notes (Signed)
Mobility Specialist Progress Note:   11/19/20 1140  Mobility  Activity Refused mobility   Pt has refused mobility x2 this morning. States he's "too weak". Did ambulate with cardiac rehab this am. Will f/u as schedule permits.   Nelta Numbers Mobility Specialist  Phone [phone]

## 2020-11-20 ENCOUNTER — Other Ambulatory Visit (HOSPITAL_COMMUNITY): Payer: Self-pay

## 2020-11-20 LAB — GLUCOSE, CAPILLARY
Glucose-Capillary: 127 mg/dL — ABNORMAL HIGH (ref 70–99)
Glucose-Capillary: 210 mg/dL — ABNORMAL HIGH (ref 70–99)

## 2020-11-20 MED ORDER — CLOPIDOGREL BISULFATE 75 MG PO TABS
75.0000 mg | ORAL_TABLET | Freq: Every day | ORAL | 11 refills | Status: DC
  Filled 2020-11-20: qty 30, 30d supply, fill #0

## 2020-11-20 MED ORDER — GUAIFENESIN ER 600 MG PO TB12: 600.0000 mg | ORAL_TABLET | Freq: Two times a day (BID) | ORAL | Status: DC

## 2020-11-20 MED ORDER — TRAMADOL HCL 50 MG PO TABS
50.0000 mg | ORAL_TABLET | Freq: Four times a day (QID) | ORAL | 0 refills | Status: DC | PRN
  Filled 2020-11-20: qty 20, 5d supply, fill #0

## 2020-11-20 MED ORDER — MIDODRINE HCL 2.5 MG PO TABS
2.5000 mg | ORAL_TABLET | Freq: Three times a day (TID) | ORAL | 1 refills | Status: DC
  Filled 2020-11-20: qty 90, 30d supply, fill #0

## 2020-11-20 MED ORDER — METOPROLOL TARTRATE 25 MG PO TABS
12.5000 mg | ORAL_TABLET | Freq: Two times a day (BID) | ORAL | 3 refills | Status: DC
  Filled 2020-11-20: qty 30, 30d supply, fill #0

## 2020-11-20 NOTE — Progress Notes (Addendum)
      [address]       [place] [address]             [phone]      7 Days Post-Op Procedure(s) (LRB): CORONARY ARTERY BYPASS GRAFTING (CABG) x 4 ON CARDIOPULMONARY BYPASS USING LIMA AND RIGHT GSV. -LIMA to LAD -SVG to OM -SVG to PDA -SVG to DIAGONAL (N/A) TRANSESOPHAGEAL ECHOCARDIOGRAM (TEE) (N/A) ENDOVEIN HARVEST OF GREATER SAPHENOUS VEIN (Right) APPLICATION OF CELL SAVER (N/A)  Subjective:  No new complaints.  The patient is refusing SNF placement.  He wants to go home.  + ambulation  + BM  Objective: Vital signs in last 24 hours: Temp:  [98 F (36.7 C)-98.9 F (37.2 C)] 98 F (36.7 C) (10/11 0407) Pulse Rate:  [83-90] 87 (10/11 0407) Cardiac Rhythm: Normal sinus rhythm (10/10 2010) Resp:  [20-25] 20 (10/11 0407) BP: (109-127)/(41-75) 124/61 (10/11 0407) SpO2:  [91 %-96 %] 93 % (10/11 0407) Weight:  [82 kg] 82 kg (10/11 0407)  Intake/Output from previous day: 10/10 0701 - 10/11 0700 In: 1440 [P.O.:1440] Out: -   General appearance: alert, cooperative, and no distress Heart: regular rate and rhythm Lungs: clear to auscultation bilaterally Abdomen: soft, non-tender; bowel sounds normal; no masses,  no organomegaly Extremities: edema trace Wound: clean and dry  Lab Results: Recent Labs    11/19/20 0107  WBC 6.9  HGB 8.7*  HCT 27.7*  PLT 224   BMET:  Recent Labs    11/19/20 0107  NA 137  K 5.0  CL 102  CO2 27  GLUCOSE 165*  BUN 24*  CREATININE 0.95  CALCIUM 8.5*    PT/INR: No results for input(s): LABPROT, INR in the last 72 hours. ABG    Component Value Date/Time   PHART 7.363 11/14/2020 1013   HCO3 19.1 (L) 11/14/2020 1013   TCO2 20 (L) 11/14/2020 1013   ACIDBASEDEF 6.0 (H) 11/14/2020 1013   O2SAT 99.0 11/14/2020 1013   CBG (last 3)  Recent Labs    11/19/20 1702 11/19/20 2027 11/20/20 0615  GLUCAP 112* 127* 127*    Assessment/Plan: S/P Procedure(s) (LRB): CORONARY ARTERY BYPASS GRAFTING (CABG) x 4 ON  CARDIOPULMONARY BYPASS USING LIMA AND RIGHT GSV. -LIMA to LAD -SVG to OM -SVG to PDA -SVG to DIAGONAL (N/A) TRANSESOPHAGEAL ECHOCARDIOGRAM (TEE) (N/A) ENDOVEIN HARVEST OF GREATER SAPHENOUS VEIN (Right) APPLICATION OF CELL SAVER (N/A)  CV- NSR, BP improved on Midodrine-on Lopressor, Midodrine Pulm- no acute issues, off oxygen, continue IS Renal- creatinine is stable, + edema on exam.. weight is at baseline, will add TED hose,  Deconditioning- patient refusing SNF, have placed orders for H/H PT and rolling walker Dispo- patient stable, BP improving on Midodrine, + edema on exam however weight is at baseline, will place TED hose, possibly for d/c home today if Dr. Kipp Brood is okay   LOS: 14 days    Ellwood Handler, PA-C 11/20/2020  Agree with above Pt refusing SNF Will plan for discharge today  Lajuana Matte

## 2020-11-20 NOTE — TOC Transition Note (Signed)
Transition of Care [facility]) - CM/SW Discharge Note   Patient Details  Name: Ricky Abbott MRN: [id number] Date of Birth: 06-01-1945  Transition of Care [facility]) CM/SW Contact:  Cyndi Bender, RN Phone Number: 11/20/2020, 10:17 AM   Clinical Narrative:   Patient stable for discharge. Orders for DME and HH PT. Spoke to patient regarding transition needs. Choice offered with list provided Per CMS guidelines from medicare.gov website with star ratings (copy placed in shadow chart) 1st choice [place]. Spoke to [employer] with [facility] for [facility] PT. Referral accepted. Patient agreeable to use in house provider for rolling walker with seat. Patient declined 3&1 and shower chair. Called made to [employer] for DME needs. Patient states he will need transportation home. Spoke to Network engineer on 4E unit and she will call [facility] transportation once patient is ready for discharge.  Address, phone and PCP verified.      Final next level of care: [place] Barriers to Discharge: Barriers Resolved   Patient Goals and CMS Choice Patient states their goals for this hospitalization and ongoing recovery are:: return Home CMS Medicare.gov Compare Post Acute Care list provided to:: Patient Choice offered to / list presented to : Patient  Discharge Placement        Home with home PT and rolling walker with seat.    Discharge Plan and Services In-house Referral: Clinical Social Work Discharge Planning Services: CM Consult Post Acute Care Choice: Home Health          DME Arranged: Walker rolling with seat DME Agency: [employer] Date DME Agency Contacted: 11/20/20 Time DME Agency Contacted: [phone] Representative spoke with at DME Agency: Bronwen Betters HH Arranged: PT [place]: [place] Date [place]: 11/20/20 Time [place]: [phone] Representative spoke with at [place]: [place] Determinants of Health ([place]) Interventions     Readmission Risk  Interventions Readmission Risk Prevention Plan 11/20/2020 11/20/2020 11/14/2020  Transportation Screening Complete Complete Complete  PCP or Specialist Appt within 3-5 Days Not Complete - Complete  HRI or Home Care Consult Complete - Complete  Social Work Consult for [place] Planning/Counseling Complete - Complete  Palliative Care Screening Not Applicable - Not Applicable  Medication Review Press photographer) Complete - Complete  Some recent data might be hidden

## 2020-11-20 NOTE — Care Management Important Message (Signed)
Important Message  Patient Details  Name: Ricky Abbott MRN: [id number] Date of Birth: 1945/05/08   Medicare Important Message Given:  Yes     Shelda Altes 11/20/2020, 8:54 AM

## 2020-11-20 NOTE — Progress Notes (Signed)
Mobility Specialist Progress Note:   11/20/20 0955  Therapy Vitals  Pulse Rate (!) 110  Mobility  Activity Ambulated in hall  Level of Assistance Standby assist, set-up cues, supervision of patient - no hands on  Assistive Device Front wheel walker  Distance Ambulated (ft) 390 ft  Mobility Ambulated independently in hallway  Mobility Response Tolerated well  Mobility performed by Mobility specialist  Bed Position Chair  $Mobility charge 1 Mobility   Pre Mobility: HR 110 bpm During Mobility: HR 117 bpm Post Mobility: HR 99 bpm  Pt was received in BR. Agreed to mobility after. Ambulated in hall 59' with RW for stability. Pt asx during ambulation. Left in chair with call bell in reach.  Nelta Numbers Mobility Specialist  Phone [phone]

## 2020-11-20 NOTE — Progress Notes (Signed)
CARDIAC REHAB PHASE I   Pt seen ambulating in hallway with mobility tech, looks and feels some stronger today. Reinforced d/c ed with pt. Pt educated on importance of site care and monitoring incisions daily. Encouraged continued IS use, walks, and sternal precautions. Stressed importance of safety. Pt states he has a neighbor he can call if he needs help. Referred to [facility] [place]. Waiting on walker for home use.  [id number] Rufina Falco, RN BSN 11/20/2020 10:23 AM

## 2020-11-21 MED FILL — Potassium Chloride Inj 2 mEq/ML: INTRAVENOUS | Qty: 40 | Status: AC

## 2020-11-21 MED FILL — Heparin Sodium (Porcine) Inj 1000 Unit/ML: Qty: 1000 | Status: AC

## 2020-11-21 MED FILL — Sodium Chloride IV Soln 0.9%: INTRAVENOUS | Qty: 2000 | Status: AC

## 2020-11-21 MED FILL — Electrolyte-R (PH 7.4) Solution: INTRAVENOUS | Qty: 4000 | Status: AC

## 2020-11-21 MED FILL — Sodium Bicarbonate IV Soln 8.4%: INTRAVENOUS | Qty: 50 | Status: AC

## 2020-11-21 MED FILL — Mannitol IV Soln 20%: INTRAVENOUS | Qty: 500 | Status: AC

## 2020-11-21 MED FILL — Heparin Sodium (Porcine) Inj 1000 Unit/ML: INTRAMUSCULAR | Qty: 20 | Status: AC

## 2020-11-21 MED FILL — Calcium Chloride Inj 10%: INTRAVENOUS | Qty: 10 | Status: AC

## 2020-11-21 MED FILL — Lidocaine HCl Local Preservative Free (PF) Inj 2%: INTRAMUSCULAR | Qty: 15 | Status: AC

## 2020-11-21 NOTE — Congregational Nurse Program (Signed)
  Dept: [phone]   Congregational Nurse Program Note  Date of Encounter: 11/21/2020 Telephone call then in home visit made to client as he was just discharged home from [facility] on 10/11. He had bypass surgery on 10/4. Discussed medications and home health services. He reports [facility] to start on 10/13 with PT. Client is interested in Home health aide services. Call placed to [facility] services can/will be assessed by the PT at the visit tomorrow. Call also placed for patient to [facility] cardiac rehab. Per RN conversation with Rehab staff, this generally starts after home PT is finished and is usually 3 days a week. Discussed all with client, he was appreciative of visit and of information given/phone calls made. He does have his follow up appointments already in place with his PCP on 10/19, telephone follow up with Cardiologist/surgeon on 10/21 as well as an in office Cardiology appointment on 10/26. He plans to use [employer] for transportation..  Client reports feeling weak, and has had some nose bleeds.Discussed using some saline spray to help moisten nares. Call his Md if they persist.. Some dyspnea with exertion. He is using his incentive spirometer as directed on hospital discharge. Incisions healing with no s/s of infection, no warmth or drainage noted.Client hopes to return to [facility] and see this RN next week. Support given.  Past Medical History: Past Medical History:  Diagnosis Date   Allergy    Anxiety    Asthma    CAD, multiple vessel    COPD (chronic obstructive pulmonary disease) ([place])    Depression    Diabetes mellitus without complication (HCC)    Dyspnea    GERD (gastroesophageal reflux disease)    History of chicken pox    Hyperlipidemia    Hypertension    MI (myocardial infarction) ([place])    Prostate enlargement    PTSD (post-traumatic stress disorder)    [place] Vet   Sarcoidosis of lung ([place])     Encounter Details:  CNP Questionnaire - 11/21/20 1200        Questionnaire   Do you give verbal consent to treat you today? Yes    Location Patient [place]    Visit Setting Home    Patient Status Unknown   n/a   Insurance Medicare    Insurance Referral Medicare    Medication N/A    Medical Provider Yes    Screening Referrals N/A    Medical Referral N/A    Medical Appointment Made N/A    Food N/A   patient currenlty does not have transportation to the grocery store   Transportation N/A    Housing/Utilities N/A    Interpersonal Safety N/A    Intervention Blood pressure;Advocate;Navigate Healthcare System;Support    ED Visit Averted N/A    Life-Saving Intervention Made N/A

## 2020-11-22 ENCOUNTER — Ambulatory Visit: Payer: Medicare Other | Admitting: Physician Assistant

## 2020-11-22 DIAGNOSIS — Z48812 Encounter for surgical aftercare following surgery on the circulatory system: Secondary | ICD-10-CM | POA: Diagnosis not present

## 2020-11-27 ENCOUNTER — Other Ambulatory Visit (HOSPITAL_BASED_OUTPATIENT_CLINIC_OR_DEPARTMENT_OTHER): Payer: Self-pay

## 2020-11-27 ENCOUNTER — Other Ambulatory Visit (HOSPITAL_COMMUNITY): Payer: Self-pay

## 2020-11-28 NOTE — Congregational Nurse Program (Signed)
  Dept: [place] Nurse Program Note  Date of Encounter: 11/28/2020 Client to clinic for the first time since hospital discharge following quadruple bypass surgery on 10/4. Still reports having some weakness and dyspnea on exertion. Is now receiving Meals on Wheels and getting his groceries delivered from [place]. Has given away all of his frozen meals and is planning on making unsweetened oatmeal with fruit for breakfast. Has ordered frozen vegetables instead of canned. Weight today fully clothed was 168 lbs.He was to have a PCP appointment today, but the appointment was rescheduled to 10/25. [facility] home health Rn comes today at 1 pm for the first visit. Home Health aide has not started yet, first visit is planned for tomorrow. Home PT continues. Support given.   Past Medical History: Past Medical History:  Diagnosis Date   Allergy    Anxiety    Asthma    CAD, multiple vessel    COPD (chronic obstructive pulmonary disease) ([place])    Depression    Diabetes mellitus without complication (HCC)    Dyspnea    GERD (gastroesophageal reflux disease)    History of chicken pox    Hyperlipidemia    Hypertension    MI (myocardial infarction) ([place])    Prostate enlargement    PTSD (post-traumatic stress disorder)    [place] Vet   Sarcoidosis of lung ([place])     Encounter Details:  CNP Questionnaire - 11/28/20 1000       Questionnaire   Do you give verbal consent to treat you today? Yes    Location [facility]    Visit Setting Other    Patient Status Unknown   n/a   Insurance Medicare    Insurance Referral N/A    Medication N/A    Medical Provider Yes    Screening Referrals N/A    Medical Referral N/A    Medical Appointment Made N/A    Food N/A   patient currenlty does not have transportation to the grocery store   Transportation N/A    Housing/Utilities N/A    Interpersonal Safety N/A    Intervention Blood pressure;Support    ED Visit  Averted N/A    Life-Saving Intervention Made N/A    Visit Setting Other

## 2020-11-30 ENCOUNTER — Other Ambulatory Visit: Payer: Self-pay

## 2020-11-30 ENCOUNTER — Ambulatory Visit (INDEPENDENT_AMBULATORY_CARE_PROVIDER_SITE_OTHER): Payer: Self-pay | Admitting: Thoracic Surgery (Cardiothoracic Vascular Surgery)

## 2020-11-30 ENCOUNTER — Telehealth: Payer: Self-pay | Admitting: Thoracic Surgery (Cardiothoracic Vascular Surgery)

## 2020-11-30 DIAGNOSIS — Z951 Presence of aortocoronary bypass graft: Secondary | ICD-10-CM

## 2020-11-30 NOTE — Progress Notes (Signed)
     [address]       [place] [address]             [phone]       Patient: Home Provider: Office Consent for Telemedicine visit obtained.  Today's visit was completed via a real-time telehealth (see specific modality noted below). The patient/authorized person provided oral consent at the time of the visit to engage in a telemedicine encounter with the present provider at [facility]. The patient/authorized person was informed of the potential benefits, limitations, and risks of telemedicine. The patient/authorized person expressed understanding that the laws that protect confidentiality also apply to telemedicine. The patient/authorized person acknowledged understanding that telemedicine does not provide emergency services and that he or she would need to call 911 or proceed to the nearest hospital for help if such a need arose.   Total time spent in the clinical discussion 10 minutes.  Telehealth Modality: Phone visit (audio only)  I had a telephone visit with [facility].  He is s/p CABG. This patient refused SNF placement at discharge.  Overall, he is doing well.  He complains of some balance issues when he first stands.  This is slowly improving.  He continues to have home health, and physical therapy.  He states that he is unable to travel back to [facility] for his follow-up appointment given the lack of his transportation.  Patient will follow up in 1 month with a chest x-ray.  If all is stable then he can be cleared for cardiac rehab and normal activity.  We will need to touch base with case management to see about arranging transportation for him.

## 2020-12-03 ENCOUNTER — Telehealth: Payer: Self-pay

## 2020-12-03 NOTE — Telephone Encounter (Signed)
Constance Goltz, nurse aide with [facility] contacted the office [phone] to state patient had an episode of passing out and hitting his head during the fall. She states that patient states that around 0530 this morning he got up and felt a little dizzy, which is not unusual for the last couple days.  Contacted the patient who stated that he did fall, hit his head and his neck and back are hurting. This happened this morning. He states that he did get dizzy when he stood up after getting out of the bed, and once he hit the hallway before he could reach the bathroom, he fell. States that when he "came to" he had to slide himself to the bed and eventually got back into the bed and laid down. States that he was unable to check his blood pressure and heart rate at that time and it has not been checked today. Advised that he should go to an Urgent care or ER for evaluation. He stated that he did not have the money for a co-pay and he was not going to go. States that he does not have a ride to [place] either as the office is arranging a ride to and from his appointment with [facility] 12/2020 . He was not short of breath, no palpitations and does not have any swelling in his lower extremities.  States that he does have an appointment with Cardiology on Wednesday this week in [place], near his home and is planning to tell them of the situation. Advised to contact us back for any further questions but that he needed to be evaluated. He acknowledged receipt.

## 2020-12-04 ENCOUNTER — Encounter: Payer: Self-pay | Admitting: Radiology

## 2020-12-04 ENCOUNTER — Emergency Department: Payer: Medicare Other

## 2020-12-04 ENCOUNTER — Telehealth: Payer: Self-pay | Admitting: Physician Assistant

## 2020-12-04 ENCOUNTER — Observation Stay: Payer: Medicare Other

## 2020-12-04 ENCOUNTER — Other Ambulatory Visit: Payer: Self-pay

## 2020-12-04 ENCOUNTER — Inpatient Hospital Stay
Admission: EM | Admit: 2020-12-04 | Discharge: 2020-12-07 | DRG: 312 | Disposition: A | Discharge status: Home Health | Payer: Medicare Other | Attending: Internal Medicine | Admitting: Internal Medicine

## 2020-12-04 DIAGNOSIS — E440 Moderate protein-calorie malnutrition: Secondary | ICD-10-CM | POA: Diagnosis present

## 2020-12-04 DIAGNOSIS — F1010 Alcohol abuse, uncomplicated: Secondary | ICD-10-CM | POA: Diagnosis present

## 2020-12-04 DIAGNOSIS — F32A Depression, unspecified: Secondary | ICD-10-CM | POA: Diagnosis present

## 2020-12-04 DIAGNOSIS — D126 Benign neoplasm of colon, unspecified: Secondary | ICD-10-CM | POA: Diagnosis present

## 2020-12-04 DIAGNOSIS — Z7982 Long term (current) use of aspirin: Secondary | ICD-10-CM

## 2020-12-04 DIAGNOSIS — I951 Orthostatic hypotension: Secondary | ICD-10-CM | POA: Diagnosis not present

## 2020-12-04 DIAGNOSIS — R0609 Other forms of dyspnea: Secondary | ICD-10-CM | POA: Diagnosis not present

## 2020-12-04 DIAGNOSIS — N400 Enlarged prostate without lower urinary tract symptoms: Secondary | ICD-10-CM | POA: Diagnosis present

## 2020-12-04 DIAGNOSIS — D860 Sarcoidosis of lung: Secondary | ICD-10-CM | POA: Diagnosis present

## 2020-12-04 DIAGNOSIS — Z83438 Family history of other disorder of lipoprotein metabolism and other lipidemia: Secondary | ICD-10-CM

## 2020-12-04 DIAGNOSIS — I10 Essential (primary) hypertension: Secondary | ICD-10-CM | POA: Diagnosis present

## 2020-12-04 DIAGNOSIS — R55 Syncope and collapse: Secondary | ICD-10-CM | POA: Diagnosis not present

## 2020-12-04 DIAGNOSIS — Z79899 Other long term (current) drug therapy: Secondary | ICD-10-CM

## 2020-12-04 DIAGNOSIS — I252 Old myocardial infarction: Secondary | ICD-10-CM

## 2020-12-04 DIAGNOSIS — G2581 Restless legs syndrome: Secondary | ICD-10-CM | POA: Diagnosis present

## 2020-12-04 DIAGNOSIS — I200 Unstable angina: Secondary | ICD-10-CM | POA: Diagnosis present

## 2020-12-04 DIAGNOSIS — R519 Headache, unspecified: Secondary | ICD-10-CM

## 2020-12-04 DIAGNOSIS — M5489 Other dorsalgia: Secondary | ICD-10-CM

## 2020-12-04 DIAGNOSIS — Z8619 Personal history of other infectious and parasitic diseases: Secondary | ICD-10-CM

## 2020-12-04 DIAGNOSIS — J449 Chronic obstructive pulmonary disease, unspecified: Secondary | ICD-10-CM | POA: Diagnosis present

## 2020-12-04 DIAGNOSIS — Z8249 Family history of ischemic heart disease and other diseases of the circulatory system: Secondary | ICD-10-CM

## 2020-12-04 DIAGNOSIS — F1011 Alcohol abuse, in remission: Secondary | ICD-10-CM | POA: Diagnosis present

## 2020-12-04 DIAGNOSIS — Z23 Encounter for immunization: Secondary | ICD-10-CM

## 2020-12-04 DIAGNOSIS — Z87891 Personal history of nicotine dependence: Secondary | ICD-10-CM

## 2020-12-04 DIAGNOSIS — Z20822 Contact with and (suspected) exposure to covid-19: Secondary | ICD-10-CM | POA: Diagnosis present

## 2020-12-04 DIAGNOSIS — W19XXXA Unspecified fall, initial encounter: Secondary | ICD-10-CM | POA: Diagnosis present

## 2020-12-04 DIAGNOSIS — K219 Gastro-esophageal reflux disease without esophagitis: Secondary | ICD-10-CM | POA: Diagnosis present

## 2020-12-04 DIAGNOSIS — F4310 Post-traumatic stress disorder, unspecified: Secondary | ICD-10-CM | POA: Diagnosis present

## 2020-12-04 DIAGNOSIS — I2510 Atherosclerotic heart disease of native coronary artery without angina pectoris: Secondary | ICD-10-CM | POA: Diagnosis not present

## 2020-12-04 DIAGNOSIS — E785 Hyperlipidemia, unspecified: Secondary | ICD-10-CM | POA: Diagnosis present

## 2020-12-04 DIAGNOSIS — Z6823 Body mass index (BMI) 23.0-23.9, adult: Secondary | ICD-10-CM

## 2020-12-04 DIAGNOSIS — E119 Type 2 diabetes mellitus without complications: Secondary | ICD-10-CM | POA: Diagnosis present

## 2020-12-04 DIAGNOSIS — N529 Male erectile dysfunction, unspecified: Secondary | ICD-10-CM | POA: Diagnosis present

## 2020-12-04 DIAGNOSIS — Z955 Presence of coronary angioplasty implant and graft: Secondary | ICD-10-CM

## 2020-12-04 DIAGNOSIS — Z7902 Long term (current) use of antithrombotics/antiplatelets: Secondary | ICD-10-CM

## 2020-12-04 DIAGNOSIS — E782 Mixed hyperlipidemia: Secondary | ICD-10-CM

## 2020-12-04 DIAGNOSIS — Z951 Presence of aortocoronary bypass graft: Secondary | ICD-10-CM

## 2020-12-04 DIAGNOSIS — S2232XA Fracture of one rib, left side, initial encounter for closed fracture: Secondary | ICD-10-CM | POA: Diagnosis present

## 2020-12-04 DIAGNOSIS — G4700 Insomnia, unspecified: Secondary | ICD-10-CM | POA: Diagnosis present

## 2020-12-04 LAB — BASIC METABOLIC PANEL
Anion gap: 7 (ref 5–15)
BUN: 22 mg/dL (ref 8–23)
CO2: 26 mmol/L (ref 22–32)
Calcium: 9.6 mg/dL (ref 8.9–10.3)
Chloride: 103 mmol/L (ref 98–111)
Creatinine, Ser: 1.03 mg/dL (ref 0.61–1.24)
GFR, Estimated: >60 mL/min (ref >60)
Glucose, Bld: 130 mg/dL — ABNORMAL HIGH (ref 70–99)
Potassium: 5.2 mmol/L — ABNORMAL HIGH (ref 3.5–5.1)
Sodium: 136 mmol/L (ref 135–145)

## 2020-12-04 LAB — URINALYSIS, ROUTINE W REFLEX MICROSCOPIC
Bilirubin Urine: NEGATIVE
Glucose, UA: NEGATIVE mg/dL
Hgb urine dipstick: NEGATIVE
Ketones, ur: NEGATIVE mg/dL
Leukocytes,Ua: NEGATIVE
Nitrite: NEGATIVE
Protein, ur: NEGATIVE mg/dL
Specific Gravity, Urine: 1.04 — ABNORMAL HIGH (ref 1.005–1.030)
pH: 5 (ref 5.0–8.0)

## 2020-12-04 LAB — URINE DRUG SCREEN, QUALITATIVE (ARMC ONLY)
Amphetamines, Ur Screen: NOT DETECTED
Barbiturates, Ur Screen: NOT DETECTED
Benzodiazepine, Ur Scrn: NOT DETECTED
Cannabinoid 50 Ng, Ur ~~LOC~~: NOT DETECTED
Cocaine Metabolite,Ur ~~LOC~~: NOT DETECTED
MDMA (Ecstasy)Ur Screen: NOT DETECTED
Methadone Scn, Ur: NOT DETECTED
Opiate, Ur Screen: NOT DETECTED
Phencyclidine (PCP) Ur S: NOT DETECTED
Tricyclic, Ur Screen: NOT DETECTED

## 2020-12-04 LAB — HEPATIC FUNCTION PANEL
ALT: 33 U/L (ref 0–44)
AST: 45 U/L — ABNORMAL HIGH (ref 15–41)
Albumin: 3.7 g/dL (ref 3.5–5.0)
Alkaline Phosphatase: 88 U/L (ref 38–126)
Bilirubin, Direct: 0.4 mg/dL — ABNORMAL HIGH (ref 0.0–0.2)
Indirect Bilirubin: 0.9 mg/dL (ref 0.3–0.9)
Total Bilirubin: 1.3 mg/dL — ABNORMAL HIGH (ref 0.3–1.2)
Total Protein: 7.1 g/dL (ref 6.5–8.1)

## 2020-12-04 LAB — TROPONIN I (HIGH SENSITIVITY): Troponin I (High Sensitivity): 6 ng/L (ref <18)

## 2020-12-04 LAB — CBC
HCT: 38.8 % — ABNORMAL LOW (ref 39.0–52.0)
Hemoglobin: 12.6 g/dL — ABNORMAL LOW (ref 13.0–17.0)
MCH: 27.4 pg (ref 26.0–34.0)
MCHC: 32.5 g/dL (ref 30.0–36.0)
MCV: 84.3 fL (ref 80.0–100.0)
Platelets: 421 10*3/uL — ABNORMAL HIGH (ref 150–400)
RBC: 4.6 MIL/uL (ref 4.22–5.81)
RDW: 14.9 % (ref 11.5–15.5)
WBC: 7.2 10*3/uL (ref 4.0–10.5)
nRBC: 0 % (ref 0.0–0.2)

## 2020-12-04 LAB — RESP PANEL BY RT-PCR (FLU A&B, COVID) ARPGX2
Influenza A by PCR: NEGATIVE
Influenza B by PCR: NEGATIVE
SARS Coronavirus 2 by RT PCR: NEGATIVE

## 2020-12-04 LAB — D-DIMER, QUANTITATIVE: D-Dimer, Quant: 3.23 ug/mL-FEU — ABNORMAL HIGH (ref 0.00–0.50)

## 2020-12-04 LAB — MAGNESIUM: Magnesium: 1.8 mg/dL (ref 1.7–2.4)

## 2020-12-04 LAB — PROCALCITONIN: Procalcitonin: <0.1 ng/mL

## 2020-12-04 LAB — BRAIN NATRIURETIC PEPTIDE: B Natriuretic Peptide: 163.5 pg/mL — ABNORMAL HIGH (ref 0.0–100.0)

## 2020-12-04 LAB — TSH: TSH: 1.255 u[IU]/mL (ref 0.350–4.500)

## 2020-12-04 LAB — ETHANOL: Alcohol, Ethyl (B): <10 mg/dL (ref <10)

## 2020-12-04 MED ORDER — SODIUM CHLORIDE 0.9% FLUSH: 3.0000 mL | Freq: Two times a day (BID) | INTRAVENOUS | Status: DC

## 2020-12-04 MED ORDER — ROPINIROLE HCL ER 4 MG PO TB24: 4.0000 mg | ORAL_TABLET | Freq: Every day | ORAL | Status: DC

## 2020-12-04 MED ORDER — ROSUVASTATIN CALCIUM 20 MG PO TABS
40.0000 mg | ORAL_TABLET | Freq: Every day | ORAL | Status: DC
  Administered 2020-12-05 – 2020-12-06 (×2): 40 mg via ORAL
  Filled 2020-12-04 (×2): qty 2

## 2020-12-04 MED ORDER — ASPIRIN EC 81 MG PO TBEC
81.0000 mg | DELAYED_RELEASE_TABLET | Freq: Every day | ORAL | Status: DC
  Administered 2020-12-05 – 2020-12-07 (×3): 81 mg via ORAL
  Filled 2020-12-04 (×3): qty 1

## 2020-12-04 MED ORDER — METOPROLOL TARTRATE 25 MG PO TABS
12.5000 mg | ORAL_TABLET | Freq: Two times a day (BID) | ORAL | Status: DC
  Administered 2020-12-05: 12.5 mg via ORAL
  Filled 2020-12-04 (×2): qty 1

## 2020-12-04 MED ORDER — ZOLPIDEM TARTRATE 5 MG PO TABS
5.0000 mg | ORAL_TABLET | Freq: Every evening | ORAL | Status: DC | PRN
  Administered 2020-12-05: 5 mg via ORAL
  Filled 2020-12-04: qty 1

## 2020-12-04 MED ORDER — SERTRALINE HCL 50 MG PO TABS
100.0000 mg | ORAL_TABLET | Freq: Every day | ORAL | Status: DC
  Administered 2020-12-05 – 2020-12-07 (×3): 100 mg via ORAL
  Filled 2020-12-04 (×3): qty 2

## 2020-12-04 MED ORDER — EZETIMIBE 10 MG PO TABS
10.0000 mg | ORAL_TABLET | Freq: Every day | ORAL | Status: DC
  Administered 2020-12-05 – 2020-12-07 (×3): 10 mg via ORAL
  Filled 2020-12-04 (×5): qty 1

## 2020-12-04 MED ORDER — MIDODRINE HCL 2.5 MG PO TABS
1.2500 mg | ORAL_TABLET | Freq: Three times a day (TID) | ORAL | Status: DC
  Filled 2020-12-04 (×3): qty 1

## 2020-12-04 MED ORDER — ENOXAPARIN SODIUM 40 MG/0.4ML IJ SOSY
40.0000 mg | PREFILLED_SYRINGE | INTRAMUSCULAR | Status: DC
  Administered 2020-12-04 – 2020-12-06 (×3): 40 mg via SUBCUTANEOUS
  Filled 2020-12-04 (×3): qty 0.4

## 2020-12-04 MED ORDER — ONDANSETRON HCL 4 MG PO TABS: 4.0000 mg | ORAL_TABLET | Freq: Four times a day (QID) | ORAL | Status: DC | PRN

## 2020-12-04 MED ORDER — ACETAMINOPHEN 650 MG RE SUPP: 650.0000 mg | Freq: Four times a day (QID) | RECTAL | Status: DC | PRN

## 2020-12-04 MED ORDER — CLOPIDOGREL BISULFATE 75 MG PO TABS
75.0000 mg | ORAL_TABLET | Freq: Every day | ORAL | Status: DC
  Administered 2020-12-05 – 2020-12-07 (×3): 75 mg via ORAL
  Filled 2020-12-04 (×3): qty 1

## 2020-12-04 MED ORDER — ROPINIROLE HCL 1 MG PO TABS
1.0000 mg | ORAL_TABLET | Freq: Four times a day (QID) | ORAL | Status: DC
  Filled 2020-12-04 (×2): qty 1

## 2020-12-04 MED ORDER — PANTOPRAZOLE SODIUM 40 MG PO TBEC
40.0000 mg | DELAYED_RELEASE_TABLET | Freq: Every day | ORAL | Status: DC
  Administered 2020-12-05 – 2020-12-07 (×3): 40 mg via ORAL
  Filled 2020-12-04 (×3): qty 1

## 2020-12-04 MED ORDER — ALBUTEROL SULFATE (2.5 MG/3ML) 0.083% IN NEBU: 2.5000 mg | INHALATION_SOLUTION | RESPIRATORY_TRACT | Status: DC | PRN

## 2020-12-04 MED ORDER — ACETAMINOPHEN 500 MG PO TABS
1000.0000 mg | ORAL_TABLET | Freq: Once | ORAL | Status: AC
  Administered 2020-12-04: 1000 mg via ORAL
  Filled 2020-12-04: qty 2

## 2020-12-04 MED ORDER — ACETAMINOPHEN 325 MG PO TABS: 650.0000 mg | ORAL_TABLET | Freq: Four times a day (QID) | ORAL | Status: DC | PRN

## 2020-12-04 MED ORDER — IOHEXOL 350 MG/ML SOLN
75.0000 mL | Freq: Once | INTRAVENOUS | Status: AC | PRN
  Administered 2020-12-04: 75 mL via INTRAVENOUS
  Filled 2020-12-04: qty 75

## 2020-12-04 MED ORDER — LIDOCAINE 5 % EX PTCH
1.0000 | MEDICATED_PATCH | CUTANEOUS | Status: DC
  Administered 2020-12-04 – 2020-12-06 (×3): 1 via TRANSDERMAL
  Filled 2020-12-04 (×4): qty 1

## 2020-12-04 MED ORDER — ONDANSETRON HCL 4 MG/2ML IJ SOLN: 4.0000 mg | Freq: Four times a day (QID) | INTRAMUSCULAR | Status: DC | PRN

## 2020-12-04 MED ORDER — NITROGLYCERIN 0.4 MG SL SUBL: 0.4000 mg | SUBLINGUAL_TABLET | SUBLINGUAL | Status: DC | PRN

## 2020-12-04 MED ORDER — ROPINIROLE HCL 1 MG PO TABS
1.0000 mg | ORAL_TABLET | Freq: Every day | ORAL | Status: AC
  Administered 2020-12-04 – 2020-12-05 (×2): 1 mg via ORAL
  Filled 2020-12-04 (×3): qty 1

## 2020-12-04 NOTE — ED Provider Notes (Addendum)
[facility] Emergency Department Provider Note  ____________________________________________   Event Date/Time   First MD Initiated Contact with Patient 12/04/20 1323     (approximate)  I have reviewed the triage vital signs and the nursing notes.   HISTORY  Chief Complaint Fall and Near Syncope   HPI AMIIR HECKARD is a 75 y.o. male with Paschal history of HTN, HDL, COPD, depression, PTSD, hepatitis, pulmonary sarcoid and significant remote tobacco abuse as well as CAD s/p CABG who presents for assessment after a syncopal episode that occurred yesterday.  Patient states remembers having a little bit of chest pain yesterday but is not sure if this was immediately preceding him losing consciousness and falling.  He states he remembers walking and feeling dizzy and then falling backwards hitting his head.  States he woke up on the floor.  Endorsing headache and neck pain as well as low back pain.      Past Medical History:  Diagnosis Date   Allergy    Anxiety    Asthma    CAD, multiple vessel    COPD (chronic obstructive pulmonary disease) ([place])    Depression    Diabetes mellitus without complication (HCC)    Dyspnea    GERD (gastroesophageal reflux disease)    History of chicken pox    Hyperlipidemia    Hypertension    MI (myocardial infarction) ([place])    Prostate enlargement    PTSD (post-traumatic stress disorder)    [place]   Sarcoidosis of lung ([place])     Patient Active Problem List   Diagnosis Date Noted   Coronary artery disease 11/13/2020   Malnutrition of moderate degree 11/09/2020   Chest pain 11/06/2020   Unstable angina ([place]) 10/06/2019   Cholecystitis    Urinary hesitancy due to benign prostatic hyperplasia 05/17/2016   Cervical spondylosis with radiculopathy 05/17/2016   VBI (vertebrobasilar insufficiency) 05/17/2016   Pre-operative cardiovascular examination 04/28/2016   Medication management 04/28/2016   History of  alcohol abuse 04/15/2016   Poor diet 04/15/2016   Chronic post-traumatic stress disorder (PTSD) 05/23/2015   DM type 2 with diabetic peripheral neuropathy ([place]) 05/21/2015   Erectile dysfunction 02/19/2015   COPD, mild ([place]) 03/08/2014   Dyspnea on exertion 03/02/2014   PTSD (post-traumatic stress disorder) 12/16/2013   Sarcoidosis 12/15/2013   History of hepatitis C virus infection 12/15/2013   S/P coronary artery stent placement 09/04/2013   Other chest pain 09/04/2013   CAD, multiple vessel 08/15/2013   Hyperlipidemia 08/15/2013   Essential hypertension 08/15/2013   Other fatigue 08/15/2013   Tubular adenoma of colon 08/15/2013   Need for tetanus booster 08/15/2013    Past Surgical History:  Procedure Laterality Date   billary tube placement     [place], [place] N/A 06/05/2016   Procedure: LAPAROSCOPIC CHOLECYSTECTOMY with cholangiogram;  Surgeon: Jules Husbands, MD;  Location: [facility];  Service: General;  Laterality: N/A;   CORONARY ARTERY BYPASS GRAFT N/A 11/13/2020   Procedure: CORONARY ARTERY BYPASS GRAFTING (CABG) x 4 ON CARDIOPULMONARY BYPASS USING LIMA AND RIGHT GSV. -LIMA to LAD -SVG to OM -SVG to PDA -SVG to DIAGONAL;  Surgeon: Lajuana Matte, MD;  Location: [place];  Service: Open Heart Surgery;  Laterality: N/A;   [place], 05/2012   x 2   ENDOVEIN HARVEST OF GREATER SAPHENOUS VEIN Right  11/13/2020   Procedure: ENDOVEIN HARVEST OF GREATER SAPHENOUS VEIN;  Surgeon: Lajuana Matte, MD;  Location: [place];  Service: Open Heart Surgery;  Laterality: Right;   IR GENERIC HISTORICAL  04/30/2016   IR PERC CHOLECYSTOSTOMY 04/30/2016 [facility]-INTERV RAD   LEFT HEART CATH AND CORONARY ANGIOGRAPHY Left 10/06/2019   Procedure: LEFT HEART CATH AND CORONARY ANGIOGRAPHY;  Surgeon: Minna Merritts, MD;  Location: [place] [facility];  Service: Cardiovascular;   Laterality: Left;   LEFT HEART CATH AND CORONARY ANGIOGRAPHY N/A 11/06/2020   Procedure: LEFT HEART CATH AND CORONARY ANGIOGRAPHY;  Surgeon: Nelva Bush, MD;  Location: [place] [facility];  Service: Cardiovascular;  Laterality: N/A;   TEE WITHOUT CARDIOVERSION N/A 11/13/2020   Procedure: TRANSESOPHAGEAL ECHOCARDIOGRAM (TEE);  Surgeon: Lajuana Matte, MD;  Location: [place];  Service: Open Heart Surgery;  Laterality: N/A;    Prior to Admission medications   Medication Sig Start Date End Date Taking? Authorizing Provider  acetaminophen (TYLENOL) 325 MG tablet Take 2 tablets (650 mg total) by mouth every 4 (four) hours as needed for headache or mild pain. 11/06/20   Nicole Kindred A, DO  albuterol (PROVENTIL) (2.5 MG/3ML) 0.083% nebulizer solution Inhale 3 mLs (2.5 mg total) into the lungs every 4 (four) hours as needed for wheezing or shortness of breath. 11/06/20   Ezekiel Slocumb, DO  aspirin EC 81 MG EC tablet Take 1 tablet (81 mg total) by mouth daily. Swallow whole. 11/07/20   Ezekiel Slocumb, DO  clopidogrel (PLAVIX) 75 MG tablet Take 1 tablet (75 mg total) by mouth daily. 11/20/20   Barrett, Erin R, PA-C  ezetimibe (ZETIA) 10 MG tablet Take 1 tablet (10 mg total) by mouth daily. 11/07/20   Ezekiel Slocumb, DO  guaiFENesin (MUCINEX) 600 MG 12 hr tablet Take 1 tablet (600 mg total) by mouth 2 (two) times daily. May take if needed for respiratory congestion 11/20/20   Barrett, Erin R, PA-C  metoprolol tartrate (LOPRESSOR) 25 MG tablet Take 1/2 tablet (12.5 mg total) by mouth 2 (two) times daily. 11/20/20   Barrett, Erin R, PA-C  midodrine (PROAMATINE) 2.5 MG tablet Take 1 tablet (2.5 mg total) by mouth 3 (three) times daily with meals. X 7 days, then decrease to 2 times per day 11/20/20   Barrett, Erin R, PA-C  nitroGLYCERIN (NITROSTAT) 0.4 MG SL tablet Place 1 tablet (0.4 mg total) under the tongue every 5 (five) minutes as needed for chest pain. 11/06/20   Ezekiel Slocumb, DO   pantoprazole (PROTONIX) 40 MG tablet Take 1 tablet (40 mg total) by mouth daily. 11/07/20   Ezekiel Slocumb, DO  polyethylene glycol (MIRALAX / GLYCOLAX) 17 g packet Take 17 g by mouth daily. 11/06/20   Ezekiel Slocumb, DO  rosuvastatin (CRESTOR) 40 MG tablet Take 1 tablet (40 mg total) by mouth daily. 11/07/20   Ezekiel Slocumb, DO  sertraline (ZOLOFT) 100 MG tablet Take 1 tablet (100 mg total) by mouth daily. 11/07/20   Ezekiel Slocumb, DO  tamsulosin (FLOMAX) 0.4 MG CAPS capsule Take 1 capsule (0.4 mg total) by mouth daily. 11/07/20   Ezekiel Slocumb, DO  traMADol (ULTRAM) 50 MG tablet Take 1 tablet (50 mg total) by mouth every 6 (six) hours as needed for moderate pain. 11/20/20   Barrett, Erin R, PA-C  zolpidem (AMBIEN) 5 MG tablet Take 1 tablet (5 mg total) by mouth at bedtime as needed for sleep. 11/06/20   Ezekiel Slocumb,  DO    Allergies Peanut-containing drug products  Family History  Problem Relation Age of Onset   Cancer Mother    Hyperlipidemia Mother    Hypertension Mother    Cancer Father        stomach   Hyperlipidemia Father    Hypertension Father    Cancer Brother        bone cancer    Social History Social History   Tobacco Use   Smoking status: Former    Packs/day: 4.00    Years: 25.00    Pack years: 100.00    Types: Cigarettes    Quit date: 03/24/1981    Years since quitting: 39.7   Smokeless tobacco: Never   Tobacco comments:    quit 30 years asgo  Vaping Use   Vaping Use: Never used  Substance Use Topics   Alcohol use: No   Drug use: No    Review of Systems  Review of Systems  Constitutional:  Negative for chills and fever.  HENT:  Negative for sore throat.   Eyes:  Negative for pain.  Respiratory:  Negative for cough and stridor.   Cardiovascular:  Positive for chest pain (yesterday briefly).  Gastrointestinal:  Negative for vomiting.  Skin:  Negative for rash.  Neurological:  Positive for dizziness and loss of consciousness.  Negative for seizures and headaches.  Psychiatric/Behavioral:  Negative for suicidal ideas.   All other systems reviewed and are negative.    ____________________________________________   PHYSICAL EXAM:  VITAL SIGNS: ED Triage Vitals [12/04/20 1221]  Enc Vitals Group     BP 113/66     Pulse Rate 87     Resp 18     Temp 97.9 F (36.6 C)     Temp Source Oral     SpO2 95 %     Weight 163 lb (73.9 kg)     Height 5\' 10"  (1.778 m)     Head Circumference      Peak Flow      Pain Score 0     Pain Loc      Pain Edu?      Excl. in [place]?    Vitals:   12/04/20 1221  BP: 113/66  Pulse: 87  Resp: 18  Temp: 97.9 F (36.6 C)  SpO2: 95%   Physical Exam Vitals and nursing note reviewed.  Constitutional:      Appearance: He is well-developed.  HENT:     Head: Normocephalic and atraumatic.     Right Ear: External ear normal.     Left Ear: External ear normal.     Nose: Nose normal.  Eyes:     Conjunctiva/sclera: Conjunctivae normal.  Cardiovascular:     Rate and Rhythm: Normal rate and regular rhythm.     Heart sounds: No murmur heard. Pulmonary:     Effort: Pulmonary effort is normal. No respiratory distress.     Breath sounds: Normal breath sounds.  Abdominal:     Palpations: Abdomen is soft.     Tenderness: There is no abdominal tenderness.  Musculoskeletal:     Cervical back: Neck supple.  Skin:    General: Skin is warm and dry.     Capillary Refill: Capillary refill takes less than 2 seconds.  Neurological:     Mental Status: He is alert and oriented to person, place, and time.  Psychiatric:        Mood and Affect: Mood normal.    Sternotomy scar appears clean  dry and intact.  Mild tenderness over the C and L-spine without any over the T-spine.  Cranial nerves II through XII are grossly intact.  No finger dysmetria.  No focal or any other drift.  Patient has full smudgy strength at all extremities.  Sensation is intact to light touch throughout all  extremities. ____________________________________________   LABS (all labs ordered are listed, but only abnormal results are displayed)  Labs Reviewed  BASIC METABOLIC PANEL - Abnormal; Notable for the following components:      Result Value   Potassium 5.2 (*)    Glucose, Bld 130 (*)    All other components within normal limits  CBC - Abnormal; Notable for the following components:   Hemoglobin 12.6 (*)    HCT 38.8 (*)    Platelets 421 (*)    All other components within normal limits  D-DIMER, QUANTITATIVE - Abnormal; Notable for the following components:   D-Dimer, Quant 3.23 (*)    All other components within normal limits  HEPATIC FUNCTION PANEL - Abnormal; Notable for the following components:   AST 45 (*)    Total Bilirubin 1.3 (*)    Bilirubin, Direct 0.4 (*)    All other components within normal limits  URINALYSIS, ROUTINE W REFLEX MICROSCOPIC  CBG MONITORING, ED  TROPONIN I (HIGH SENSITIVITY)   ____________________________________________  EKG  Sinus rhythm with a ventricular rate of 92, left axis deviation, right bundle branch block with some nonspecific changes in inferior lateral and anterior leads. ____________________________________________  RADIOLOGY  ED MD interpretation:    CT head shows no evidence of acute intracranial hemorrhage, subacute CVA, skull fracture or other acute intracranial process.  CT C-spine shows no acute fracture or subluxation of the C-spine.  There is some height loss and osteophytosis throughout.  There is also evidence of what appears to be a minimally displaced left first rib fracture.  This rib fractures not seen on chest x-ray which otherwise shows no pneumothorax, effusion, edema, focal consolidation or other acute thoracic process.  Plain film of the L-spine shows no acute fracture dislocation but does show a multilevel disc base height loss and degenerative changes.  Plain film of the T-spine shows no acute fracture  dislocation but some height loss throughout degenerative changes.  Official radiology report(s): DG Chest 2 View  Result Date: 12/04/2020 CLINICAL DATA:  Dizziness and syncope EXAM: CHEST - 2 VIEW COMPARISON:  Chest x-ray dated November 18, 2020 FINDINGS: Cardiac and mediastinal contours are within normal limits. Status post median sternotomy and CABG. Lungs are clear. No evidence of effusion or pneumothorax. Displaced fracture. IMPRESSION: No acute cardiopulmonary finding. Electronically Signed   By: Yetta Glassman M.D.   On: 12/04/2020 14:34   DG Thoracic Spine 2 View  Result Date: 12/04/2020 CLINICAL DATA:  Fall EXAM: THORACIC SPINE 2 VIEWS; LUMBAR SPINE - COMPLETE 4+ VIEW COMPARISON:  None. FINDINGS: No fracture or dislocation of the thoracic or lumbar spine. Moderate multilevel disc space height loss and osteophytosis throughout, with bridging osteophytes at many thoracic levels. Mild to moderate facet degenerative change, worst at the lower lumbar levels. Status post median sternotomy and CABG. Nonobstructive pattern of bowel gas. IMPRESSION: 1.  No fracture or dislocation of the thoracic or lumbar spine. 2. Moderate multilevel disc space height loss and osteophytosis throughout, with bridging osteophytes at many thoracic levels. Mild to moderate facet degenerative change, worst at the lower lumbar levels. 3. Disc and neural foraminal pathology may be further evaluated by MRI if indicated  by neurologically localizing signs and symptoms. Electronically Signed   By: Delanna Ahmadi M.D.   On: 12/04/2020 14:35   DG Lumbar Spine Complete  Result Date: 12/04/2020 CLINICAL DATA:  Fall EXAM: THORACIC SPINE 2 VIEWS; LUMBAR SPINE - COMPLETE 4+ VIEW COMPARISON:  None. FINDINGS: No fracture or dislocation of the thoracic or lumbar spine. Moderate multilevel disc space height loss and osteophytosis throughout, with bridging osteophytes at many thoracic levels. Mild to moderate facet degenerative change,  worst at the lower lumbar levels. Status post median sternotomy and CABG. Nonobstructive pattern of bowel gas. IMPRESSION: 1.  No fracture or dislocation of the thoracic or lumbar spine. 2. Moderate multilevel disc space height loss and osteophytosis throughout, with bridging osteophytes at many thoracic levels. Mild to moderate facet degenerative change, worst at the lower lumbar levels. 3. Disc and neural foraminal pathology may be further evaluated by MRI if indicated by neurologically localizing signs and symptoms. Electronically Signed   By: Delanna Ahmadi M.D.   On: 12/04/2020 14:35   CT HEAD WO CONTRAST (5MM)  Result Date: 12/04/2020 CLINICAL DATA:  Head trauma EXAM: CT HEAD WITHOUT CONTRAST TECHNIQUE: Contiguous axial images were obtained from the base of the skull through the vertex without intravenous contrast. COMPARISON:  None. FINDINGS: Brain: No evidence of acute infarction, hemorrhage, hydrocephalus, extra-axial collection or mass lesion/mass effect. Mild cortical volume loss. Mild patchy hypodensities in the periventricular and subcortical white matter, likely secondary to chronic microvascular ischemic changes. Vascular: No hyperdense vessel or unexpected calcification. Skull: Normal. Negative for fracture or focal lesion. Sinuses/Orbits: No acute finding. Other: None. IMPRESSION: Chronic changes with no acute intracranial process identified. Electronically Signed   By: Ofilia Neas   On: 12/04/2020 13:20   CT Cervical Spine Wo Contrast  Result Date: 12/04/2020 CLINICAL DATA:  Fall EXAM: CT CERVICAL SPINE WITHOUT CONTRAST TECHNIQUE: Multidetector CT imaging of the cervical spine was performed without intravenous contrast. Multiplanar CT image reconstructions were also generated. COMPARISON:  None. FINDINGS: Alignment: Degenerative straightening and reversal of the normal cervical lordosis. Skull base and vertebrae: No acute fracture. No primary bone lesion or focal pathologic process.  Soft tissues and spinal canal: No prevertebral fluid or swelling. No visible canal hematoma. Disc levels: Mild disc space height loss and osteophytosis throughout the cervical spine, with bridging osteophytes at C2-C3 and C5-C6. Upper chest: Minimally displaced fracture of the posterior left first rib, partially imaged (series 2, image 75). Other: None. IMPRESSION: 1. No fracture or static subluxation of the cervical spine. 2. Mild disc space height loss and osteophytosis throughout the cervical spine, with bridging osteophytes at C2-C3 and C5-C6. 3. Minimally displaced fracture of the posterior left first rib, partially imaged. Electronically Signed   By: Delanna Ahmadi M.D.   On: 12/04/2020 14:38    ____________________________________________   PROCEDURES  Procedure(s) performed (including Critical Care):  Procedures   ____________________________________________   INITIAL IMPRESSION / ASSESSMENT AND PLAN / ED COURSE      Presents with above-stated history exam for assessment after a syncopal episode that occurred yesterday.  Patient states he was feeling dizzy and chest pain yesterday was not sure if he had chest pain before this.  He states he has had a little bit of a cough since being discharged with this does not change significantly.  He is endorsing headache as well as some neck pain and low back pain.  He has no new weakness or other clear associated sick symptoms.  On arrival he is afebrile and  hemodynamically stable.  He does have some tenderness over his back as noted above but otherwise is neurovascularly intact in all extremities.  His abdomen is soft nontender throughout and his chest appears unremarkable with sternotomy scar clean dry and intact.  With regard to syncope yesterday differential includes CVA, arrhythmia, anemia, metabolic derangements, ACS, PE and orthostatic or vasovagal.  Given patient thinks he hit his head complaining of some neck and back pain as well I did  obtain CT of the head and C-spine as well as plain films of the chest and T and L-spine.  CT head shows no evidence of acute intracranial hemorrhage, subacute CVA, skull fracture or other acute intracranial process.  CT C-spine shows no acute fracture or subluxation of the C-spine.  There is some height loss and osteophytosis throughout.  There is also evidence of what appears to be a minimally displaced left first rib fracture.  This rib fractures not seen on chest x-ray which otherwise shows no pneumothorax, effusion, edema, focal consolidation or other acute thoracic process.  Plain film of the L-spine shows no acute fracture dislocation but does show a multilevel disc base height loss and degenerative changes.  Plain film of the T-spine shows no acute fracture dislocation but some height loss throughout degenerative changes.  Sinus rhythm with a ventricular rate of 92, left axis deviation, right bundle branch block with some nonspecific changes in inferior lateral and anterior leads.  D-dimer is elevated 3.23.  Thus obtain a CTA chest to rule out a PE.  CBC shows no leukocytosis or significant acute anemia.  BMP remarkable for K of 5.2 without any other significant electrolyte or metabolic derangements.  Care patient signed over to assuming provider approximately 1500.  Plan is to follow-up CTA as well as troponin and hepatic function panel and likely admit for high risk syncope and fall.      ____________________________________________   FINAL CLINICAL IMPRESSION(S) / ED DIAGNOSES  Final diagnoses:  Syncope and collapse  Nonintractable headache, unspecified chronicity pattern, unspecified headache type  Midline back pain, unspecified back location, unspecified chronicity  S/P CABG (coronary artery bypass graft)  Fall, initial encounter  Closed fracture of one rib of left side, initial encounter    Medications  lidocaine (LIDODERM) 5 % 1 patch (1 patch Transdermal Patch  Applied 12/04/20 1422)  acetaminophen (TYLENOL) tablet 1,000 mg (1,000 mg Oral Given 12/04/20 1422)     ED Discharge Orders     None        Note:  This document was prepared using Dragon voice recognition software and may include unintentional dictation errors.    Lucrezia Starch, MD 12/04/20 1505    Lucrezia Starch, MD 12/04/20 [phone]

## 2020-12-04 NOTE — ED Provider Notes (Signed)
-----------------------------------------   3:11 PM on 12/04/2020 -----------------------------------------  Blood pressure 113/66, pulse 87, temperature 97.9 F (36.6 C), temperature source Oral, resp. rate 18, height 5\' 10"  (1.778 m), weight 73.9 kg, SpO2 95 %.  Assuming care from Dr. Tamala Julian.  In short, Ricky Abbott is a 75 y.o. male with a chief complaint of Fall and Near Syncope .  Refer to the original H&P for additional details.  The current plan of care is to follow-up CTA chest, anticipate admission for high risk syncope.  ----------------------------------------- 5:22 PM on 12/04/2020 ----------------------------------------- CTA is negative for PE or other acute process, patient asymptomatic on my reevaluation.  Per Dr. Tamala Julian, plan to discuss with hospitalist for admission due to concern for high risk syncope.    Blake Divine, MD 12/04/20 1723

## 2020-12-04 NOTE — Telephone Encounter (Signed)
In reviewing patient's chart for clinic visit on 10/26, I saw he contacted his surgeon's office reporting a syncopal episode and hit his head on the floor. He is s/p recent CABG on 11/13/2020. Agree with their recommendation for him to be evaluated in the ED.

## 2020-12-04 NOTE — ED Notes (Signed)
Patient transported to X-ray 

## 2020-12-04 NOTE — ED Triage Notes (Addendum)
Pt here with a fall early yesterday morning. Pt had heart surgery on the 2nd of this month. Pt states that he did hit his head when he fell and he had a LOC. Pt states that he slid back to his bedroom and laid there for a while. Pt having dizziness and lightheadedness. Pt in NAD in triage.

## 2020-12-04 NOTE — Progress Notes (Deleted)
Cardiology Office Note    Date:  12/04/2020   ID:  Ricky Abbott, DOB 1945/11/23, MRN [id number]  PCP:  Theotis Burrow, MD  Cardiologist:  Ida Rogue, MD  Electrophysiologist:  None   Chief Complaint: Hospital follow up  History of Present Illness:   Ricky Abbott is a 75 y.o. male with history of *** High sensitivity troponin peaked at 65 ***   Labs independently reviewed: 11/2020 - potassium 5.0, BUN 24, SCr 0.95, HGB 8.7, PLT 224, magnesium 2.6, A1c 6.2 10/2020 - TC 132, TG 143, HDL 36, LDL 67, albumin 4.1, AST/ALT normal  Past Medical History:  Diagnosis Date   Allergy    Anxiety    Asthma    CAD, multiple vessel    COPD (chronic obstructive pulmonary disease) ([place])    Depression    Diabetes mellitus without complication ([place])    Dyspnea    GERD (gastroesophageal reflux disease)    History of chicken pox    Hyperlipidemia    Hypertension    MI (myocardial infarction) ([place])    Prostate enlargement    PTSD (post-traumatic stress disorder)    [place] Vet   Sarcoidosis of lung ([place])     Past Surgical History:  Procedure Laterality Date   billary tube placement     [place], [place] N/A 06/05/2016   Procedure: LAPAROSCOPIC CHOLECYSTECTOMY with cholangiogram;  Surgeon: Jules Husbands, MD;  Location: [facility];  Service: General;  Laterality: N/A;   CORONARY ARTERY BYPASS GRAFT N/A 11/13/2020   Procedure: CORONARY ARTERY BYPASS GRAFTING (CABG) x 4 ON CARDIOPULMONARY BYPASS USING LIMA AND RIGHT GSV. -LIMA to LAD -SVG to OM -SVG to PDA -SVG to DIAGONAL;  Surgeon: Lajuana Matte, MD;  Location: [facility];  Service: Open Heart Surgery;  Laterality: N/A;   [place], 05/2012   x 2   ENDOVEIN HARVEST OF GREATER SAPHENOUS VEIN Right 11/13/2020   Procedure: ENDOVEIN HARVEST OF GREATER SAPHENOUS VEIN;  Surgeon: Lajuana Matte, MD;   Location: [facility];  Service: Open Heart Surgery;  Laterality: Right;   IR GENERIC HISTORICAL  04/30/2016   IR PERC CHOLECYSTOSTOMY 04/30/2016 [facility]INTER[facility]   LEFT HEART CATH AND CORONARY ANGIOGRAPHY Left 10/06/2019   Procedure: LEFT HEART CATH AND CORONARY ANGIOGRAPHY;  Surgeon: Minna Merritts, MD;  Location: [facility];  Service: Cardiovascular;  Laterality: Left;   LEFT HEART CATH AND CORONARY ANGIOGRAPHY N/A 11/06/2020   Procedure: LEFT HEART CATH AND CORONARY ANGIOGRAPHY;  Surgeon: Nelva Bush, MD;  Location: [facility];  Service: Cardiovascular;  Laterality: N/A;   TEE WITHOUT CARDIOVERSION N/A 11/13/2020   Procedure: TRANSESOPHAGEAL ECHOCARDIOGRAM (TEE);  Surgeon: Lajuana Matte, MD;  Location: [facility];  Service: Open Heart Surgery;  Laterality: N/A;    Current Medications: No outpatient medications have been marked as taking for the 12/05/20 encounter (Appointment) with Rise Mu, PA-C.    Allergies:   Peanut-containing drug products   Social History   Socioeconomic History   Marital status: Single    Spouse name: Not on file   Number of children: Not on file   Years of education: Not on file   Highest education level: Not on file  Occupational History   Not on file  Tobacco Use   Smoking status: Former    Packs/day: 4.00  Years: 25.00    Pack years: 100.00    Types: Cigarettes    Quit date: 03/24/1981    Years since quitting: 39.7   Smokeless tobacco: Never   Tobacco comments:    quit 30 years asgo  Vaping Use   Vaping Use: Never used  Substance and Sexual Activity   Alcohol use: No   Drug use: No   Sexual activity: Never  Other Topics Concern   Not on file  Social History Narrative   Yvone Neu grew up in [place], [place]. He moved to the [place] area in 2013. 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   Social Determinants of Health   Financial Resource Strain: Not on file  Food Insecurity: Not on file  Transportation Needs: Not on file   Physical Activity: Not on file  Stress: Not on file  Social Connections: Not on file     Family History:  The patient's family history includes Cancer in his brother, father, and mother; Hyperlipidemia in his father and mother; Hypertension in his father and mother.  ROS:   ROS   EKGs/Labs/Other Studies Reviewed:    Studies reviewed were summarized above. The additional studies were reviewed today:  Intraoperative TEE 11/13/2020: POST-OP IMPRESSIONS  _ Left Ventricle: has mildly reduced systolic function. The cavity size  was  normal. The wall motion is normal.  _ Right Ventricle: The right ventricle appears unchanged from pre-bypass.  _ Aorta: The aorta appears unchanged from pre-bypass.  _ Left Atrial Appendage: The left atrial appendage appears unchanged from  pre-bypass.  _ Aortic Valve: The aortic valve appears unchanged from pre-bypass.  _ Mitral Valve: The mitral valve appears unchanged from pre-bypass.  _ Tricuspid Valve: The tricuspid valve appears unchanged from pre-bypass.  _ Interatrial Septum: The interatrial septum appears unchanged from  pre-bypass.  _ Pericardium: The pericardium appears unchanged from pre-bypass.  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 No new or  worsening  wall motion or valvular abnormalities. Mildly reduced EF 45-50% unchanged.  __________  Pre-CABG vascular imaging 11/09/2020: Summary:  Right Carotid: Velocities in the right ICA are consistent with a 1-39%  stenosis.   Left Carotid: Velocities in the left ICA are consistent with a 1-39%  stenosis.  Vertebrals: Bilateral vertebral arteries demonstrate antegrade flow.   Right ABI: Resting right ankle-brachial index is within normal range. No  evidence of significant right lower extremity arterial disease.  Left ABI: Resting left ankle-brachial index is within normal range. No  evidence of significant left lower extremity arterial disease.  Right Upper Extremity:  Doppler waveform obliterate with right radial  compression. Doppler waveform obliterate with right ulnar compression.  Left Upper Extremity: Doppler waveform obliterate with left radial  compression. Doppler waveforms remain within normal limits with left ulnar  compression.  __________  2D echo 11/07/2020: 1. Distal septal and inferior hypokinesis . Left ventricular ejection  fraction, by estimation, is 45 to 50%. The left ventricle has mildly  decreased function. The left ventricle demonstrates regional wall motion  abnormalities (see scoring  diagram/findings for description). There is mild left ventricular  hypertrophy. Left ventricular diastolic parameters were normal.   2. Right ventricular systolic function is normal. The right ventricular  size is normal.   3. The mitral valve is abnormal. Trivial mitral valve regurgitation. No  evidence of mitral stenosis.   4. The aortic valve is tricuspid. Aortic valve regurgitation is not  visualized. Mild to moderate aortic valve sclerosis/calcification is  present, without any evidence  of aortic stenosis.   5. The inferior vena cava is normal in size with greater than 50%  respiratory variability, suggesting right atrial pressure of 3 mmHg. __________  LHC 11/06/2020: Conclusions: Severe three-vessel coronary artery disease, as detailed below. Low normal left ventricular systolic function (LVEF 29-51%) with mid and apical inferior hypokinesis.  Mildly elevated left ventricular filling pressure (LVEDP 20-25 mmHg).   Recommendations: Transfer to [facility] for cardiac surgery consultation. Hold clopidogrel pending surgical consultation. Restart IV heparin 2 hours after TR band removal. Aggressive secondary prevention. __________  2D echo 10/07/2019: 1. Left ventricular ejection fraction, by estimation, is 60 to 65%. The  left ventricle has normal function. The left ventricle has no regional  wall motion abnormalities. Left ventricular  diastolic parameters are  consistent with Grade I diastolic  dysfunction (impaired relaxation).   2. Right ventricular systolic function is normal. The right ventricular  size is normal. Tricuspid regurgitation signal is inadequate for assessing  PA pressure.   3. Mild mitral valve regurgitation. __________   LHC 10/06/2019: Coronary dominance: Left  Left mainstem:   Large vessel that bifurcates into the LAD and left circumflex, 60% mid to distal left main disease  Left anterior descending (LAD):   Large vessel that extends to the apical region, diagonal branch 2 of moderate size, stent, possibly 2 sequential stents in the proximal LAD with in-stent restenosis estimated at 70%, 60% disease of the LAD after D1, heavily calcified, diffuse disease  Left circumflex (LCx):  Large vessel with OM branch 2, high OM/ramus small vessel with severe disease estimated 80% throughout, mild diffuse disease of the left circumflex  Right coronary artery (RCA): Nondominant vessel, small caliber throughout, heavily diseased, severe proximal disease estimated 80%  Left ventriculography: Left ventricular systolic function is normal, LVEF is estimated at 55%, there is no significant mitral regurgitation , no significant aortic valve stenosis  Final Conclusions:   Moderate disease of mid to distal left main, Moderate to severe disease of proximal to the mid LAD, in-stent restenosis Left dominant with small diffusely diseased RCA Grossly normal EF  Recommendations:  Case discussed with interventional cardiology, given very mild symptoms,  Will pursue medical management Start isosorbide for blood pressure control, For any worsening anginal symptoms may need to consider CABG with LIMA to the LAD The above was discussed with him in detail __________   Lexiscan MPI 09/22/2019: Pharmacological myocardial perfusion imaging study with mild to moderate ischemia in the inferior wall Unable to exclude  diaphragmatic attenuation artifact Normal wall motion, EF estimated at 63% No EKG changes concerning for ischemia at peak stress or in recovery. CT attenuation correction images were unavailable Moderate risk scan Patient notes indicating previously noted predominantly fixed perfusion defect in the inferior wall on stress imaging 2015 Clinical correlation recommended.  Could consider alternate form of imaging such as cardiac CTA versus catheterization if symptoms indicate. __________   2D echo 04/28/2016: - Left ventricle: The cavity size was at the upper limits of    normal. Wall thickness was at the upper limits of normal.    Systolic function was normal. The estimated ejection fraction was    in the range of 55% to 60%. Wall motion was normal; there were no    regional wall motion abnormalities. Doppler parameters are    consistent with abnormal left ventricular relaxation (grade 1    diastolic dysfunction).  - Left atrium: The atrium was mildly dilated.  - Right ventricle: The cavity size was normal. Systolic  function    was normal.    EKG:  EKG is ordered today.  The EKG ordered today demonstrates ***  Recent Labs: 11/05/2020: ALT 30 11/14/2020: Magnesium 2.6 11/19/2020: BUN 24; Creatinine, Ser 0.95; Hemoglobin 8.7; Platelets 224; Potassium 5.0; Sodium 137  Recent Lipid Panel    Component Value Date/Time   CHOL 132 11/06/2020 0003   CHOL 169 09/09/2019 1356   TRIG 143 11/06/2020 0003   HDL 36 (L) 11/06/2020 0003   HDL 41 09/09/2019 1356   CHOLHDL 3.7 11/06/2020 0003   VLDL 29 11/06/2020 0003   LDLCALC 67 11/06/2020 0003   LDLCALC 95 09/09/2019 1356   LDLDIRECT 101 (H) 09/09/2019 1356   LDLDIRECT 117.0 04/18/2016 1202    PHYSICAL EXAM:    VS:  There were no vitals taken for this visit.  BMI: There is no height or weight on file to calculate BMI.  Physical Exam  Wt Readings from Last 3 Encounters:  11/28/20 168 lb (76.2 kg)  11/20/20 180 lb 12.4 oz (82 kg)   11/05/20 194 lb 0.1 oz (88 kg)     ASSESSMENT & PLAN:   ***  Disposition: F/u with Dr. Rockey Situ or an APP in ***.   Medication Adjustments/Labs and Tests Ordered: Current medicines are reviewed at length with the patient today.  Concerns regarding medicines are outlined above. Medication changes, Labs and Tests ordered today are summarized above and listed in the Patient Instructions accessible in Encounters.   Signed, Christell Faith, PA-C 12/04/2020 8:59 AM     [address] [place], [place] [address] [phone]

## 2020-12-04 NOTE — ED Notes (Signed)
Patient transported to MRI 

## 2020-12-04 NOTE — ED Notes (Signed)
Patient returns from xray.

## 2020-12-04 NOTE — H&P (Addendum)
History and Physical   Ricky Abbott YTK:[id number] DOB: Dec 18, 1945 DOA: 12/04/2020  PCP: Theotis Burrow, MD  Outpatient Specialists: Dr. Kipp Brood, [facility] Patient coming from: Home  I have personally briefly reviewed patient's old medical records in [place].  Chief Concern: Syncope  HPI: Ricky Abbott is a 75 y.o. male with medical history significant for CAD status post CABG on 11/13/2020, hypertension, GERD, insomnia, depression, anxiety, hyperlipidemia, COPD, history of alcohol abuse, history of tubular adenoma of colon, who presents to the emergency department for chief concerns of syncope.  At bedside, he is able to tell me his name, age, current location of hospital, current calendar year.   He was up at 5:30 AM on 12/03/20, when he got up to urinate, he passed out. He was getting ready to walk to bathroom, he woke up in the hallway. He crawled back to the bedroom. He does not know how long he was out. He had chest pain after eating breakfast on 12/03/20. He reports the pain is sharp, lasting 10-15 seconds. It goes away on his own. He reports he has had the chest pain before and it was before the cabg surgery. He reports his shortness of breath is worse now, since after the cabg surgery.   He denies new swelling in his legs.   He endorses dizziness since cabg surgery. He denies chest pain at this time. He reports that since the surgery, his bilateral feet get really numb that it wakes him up from sleep. He states that ambulating makes the numbness go away. This happens every other night, or every two or three nights.   He has only passed out once since after his cabg surgery. He has only gotten close. He endorses nausea and denies vomiting. He denies diarrhea, dysuria, cough, chills, fever, headache, abdominal pain.   Social history: He lives alone. 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 He was stationed in [place].  He is retired now. He is a former tobacco user, quit 1981, at his peak, smoking 3-4 packs per day. He is a former heavy etoh user. He current does not drink and does not remember when his last drink was. He denies recreational drug use.  Vaccination history: He is vaccinate for covid 19, 4 doses Moderna.   ROS: Constitutional: no weight change, no fever ENT/Mouth: no sore throat, no rhinorrhea Eyes: no eye pain, no vision changes Cardiovascular: no chest pain, no dyspnea,  no edema, no palpitations Respiratory: no cough, no sputum, no wheezing Gastrointestinal: no nausea, no vomiting, no diarrhea, no constipation Genitourinary: no urinary incontinence, no dysuria, no hematuria Musculoskeletal: no arthralgias, no myalgias Skin: no skin lesions, no pruritus, Neuro: + weakness, no loss of consciousness, no syncope Psych: no anxiety, no depression, + decrease appetite Heme/Lymph: no bruising, no bleeding  ED Course: Discussed with emergency medicine provider, patient requiring hospitalization for chief concerns of syncope.  Vitals in the emergency department was remarkable for temperature of 97.9, respiration rate of 18, heart rate of 87, initial blood pressure 113/66, SPO2 of 95% on room air.  Labs in the emergency department was remarkable for sodium 136, potassium 5.2, chloride 103, bicarb of 26, BUN of 22, serum creatinine of 1.03, nonfasting blood glucose 130, GFR of 60.  WBC 7.2, hemoglobin 12.6, platelets 421.  AST was 45.  ALT is 33.  D-dimer was elevated at 3.23.  High sensitive troponin was 6.  CT of the  head without contrast was ordered, thoracic spine 2 view ordered lumbar spine complete ordered CT of the cervical spine without contrast was ordered, CTA of the chest to assess for pulmonary embolism were ordered.  Work-up was relatively unremarkable with out any acute read by radiology.  In the emergency department patient was given acetaminophen 1000 mg p.o., lidocaine  patch.  Assessment/Plan  Principal Problem:   Syncope Active Problems:   CAD, multiple vessel   Hyperlipidemia   Essential hypertension   Tubular adenoma of colon   S/P coronary artery stent placement   History of hepatitis C virus infection   PTSD (post-traumatic stress disorder)   Dyspnea on exertion   COPD, mild (HCC)   Erectile dysfunction   History of alcohol abuse   Unstable angina (HCC)   Malnutrition of moderate degree   Coronary artery disease   # Syncope-etiology work-up in progress - Low clinical suspicion for infectious etiology at this time as patient is afebrile, no leukocytosis - Check procalcitonin - MRI of the brain without contrast has been ordered - Orthostatic hypotension vitals ordered - B12, TSH, vitamin D, UDS, EtOH levels have been ordered, Procalcitonin - Would recommend discharging team to discharge patient with Holter monitor if the above work-up is unremarkable - Complete echo was considered however patient had a TEE echo on 11/13/2020 and complete echo on 11/07/2020: Distal septal and inferior hypokinesis.  Ejection fraction 45 to 50%. - Would recommend a.m. team to consult cardiology for further recommendations, especially regarding metoprolol optimization - A.m. team to consider neurology consultation if the above work-up is unremarkable - Fall precautions  # CAD - status post CABG on 11/13/2020 - Patient had LIMA to LAD,  - Reverse saphenous vein graft to PDA, OM1, and first diagonal - via endoscopic greater saphenous vein harvest on the right - Clopidogrel 75 mg daily, aspirin 81 mg, rosuvastatin 40 mg nightly resumed  # Bilateral lower extremity numbness at night - Check magnesium level - Trial of Requip 1 mg nightly, 2 doses ordered  # Hyperlipidemia-rosuvastatin  # Right lower extremity - wounds from vein harvest - Image in media  # QT prolongation-avoid QT prolonging agents  # Insomnia-resumed home zolpidem 5 mg p.o. nightly as  needed for sleep  # Depression/anxiety-resume sertraline 100 mg p.o. daily  # GERD-PPI  # BPH-holding home Flomax given patient presentation with syncope and taking beta-blockade  Chart reviewed.   DVT prophylaxis: Enoxaparin Code Status: Full code Diet: Heart healthy Family Communication: No Disposition Plan: Pending clinical course Consults called: None at this time Admission status: MedSurg, observation, telemetry  Past Medical History:  Diagnosis Date   Allergy    Anxiety    Asthma    CAD, multiple vessel    COPD (chronic obstructive pulmonary disease) ([place])    Depression    Diabetes mellitus without complication (HCC)    Dyspnea    GERD (gastroesophageal reflux disease)    History of chicken pox    Hyperlipidemia    Hypertension    MI (myocardial infarction) ([place])    Prostate enlargement    PTSD (post-traumatic stress disorder)    [place] Vet   Sarcoidosis of lung ([place])    Past Surgical History:  Procedure Laterality Date   billary tube placement     [place], [place] N/A 06/05/2016   Procedure: LAPAROSCOPIC CHOLECYSTECTOMY with cholangiogram;  Surgeon: Marjory Lies  Pabon, MD;  Location: [facility];  Service: General;  Laterality: N/A;   CORONARY ARTERY BYPASS GRAFT N/A 11/13/2020   Procedure: CORONARY ARTERY BYPASS GRAFTING (CABG) x 4 ON CARDIOPULMONARY BYPASS USING LIMA AND RIGHT GSV. -LIMA to LAD -SVG to OM -SVG to PDA -SVG to DIAGONAL;  Surgeon: Lajuana Matte, MD;  Location: [place];  Service: Open Heart Surgery;  Laterality: N/A;   [place], 05/2012   x 2   ENDOVEIN HARVEST OF GREATER SAPHENOUS VEIN Right 11/13/2020   Procedure: ENDOVEIN HARVEST OF GREATER SAPHENOUS VEIN;  Surgeon: Lajuana Matte, MD;  Location: [place];  Service: Open Heart Surgery;  Laterality: Right;   IR GENERIC HISTORICAL  04/30/2016   IR PERC CHOLECYSTOSTOMY 04/30/2016  [facility]   LEFT HEART CATH AND CORONARY ANGIOGRAPHY Left 10/06/2019   Procedure: LEFT HEART CATH AND CORONARY ANGIOGRAPHY;  Surgeon: Minna Merritts, MD;  Location: [place] [facility];  Service: Cardiovascular;  Laterality: Left;   LEFT HEART CATH AND CORONARY ANGIOGRAPHY N/A 11/06/2020   Procedure: LEFT HEART CATH AND CORONARY ANGIOGRAPHY;  Surgeon: Nelva Bush, MD;  Location: [place] [facility];  Service: Cardiovascular;  Laterality: N/A;   TEE WITHOUT CARDIOVERSION N/A 11/13/2020   Procedure: TRANSESOPHAGEAL ECHOCARDIOGRAM (TEE);  Surgeon: Lajuana Matte, MD;  Location: [place];  Service: Open Heart Surgery;  Laterality: N/A;   Social History:  reports that he quit smoking about 39 years ago. His smoking use included cigarettes. He has a 100.00 pack-year smoking history. He has never used smokeless tobacco. He reports that he does not drink alcohol and does not use drugs.  Allergies  Allergen Reactions   Peanut-Containing Drug Products Swelling    Swelling, rash, (Peanuts)   Family History  Problem Relation Age of Onset   Cancer Mother    Hyperlipidemia Mother    Hypertension Mother    Cancer Father        stomach   Hyperlipidemia Father    Hypertension Father    Cancer Brother        bone cancer   Family history: Family history reviewed and pertinent for hypertension in mother and father.  Prior to Admission medications   Medication Sig Start Date End Date Taking? Authorizing Provider  aspirin EC 81 MG EC tablet Take 1 tablet (81 mg total) by mouth daily. Swallow whole. 11/07/20  Yes Nicole Kindred A, DO  clopidogrel (PLAVIX) 75 MG tablet Take 1 tablet (75 mg total) by mouth daily. 11/20/20  Yes Barrett, Erin R, PA-C  ezetimibe (ZETIA) 10 MG tablet Take 1 tablet (10 mg total) by mouth daily. 11/07/20  Yes Nicole Kindred A, DO  metoprolol tartrate (LOPRESSOR) 25 MG tablet Take 1/2 tablet (12.5 mg total) by mouth 2 (two) times daily. 11/20/20  Yes Barrett, Erin  R, PA-C  midodrine (PROAMATINE) 2.5 MG tablet Take 1 tablet (2.5 mg total) by mouth 3 (three) times daily with meals. X 7 days, then decrease to 2 times per day Patient taking differently: Take 0.75 mg by mouth 3 (three) times daily with meals. X 7 days, then decrease to 2 times per day 11/20/20  Yes Barrett, Erin R, PA-C  pantoprazole (PROTONIX) 40 MG tablet Take 1 tablet (40 mg total) by mouth daily. 11/07/20  Yes Nicole Kindred A, DO  rosuvastatin (CRESTOR) 40 MG tablet Take 1 tablet (40 mg total) by mouth daily. 11/07/20  Yes Nicole Kindred A, DO  sertraline (ZOLOFT) 100 MG tablet Take 1 tablet (  100 mg total) by mouth daily. 11/07/20  Yes Nicole Kindred A, DO  tamsulosin (FLOMAX) 0.4 MG CAPS capsule Take 1 capsule (0.4 mg total) by mouth daily. 11/07/20  Yes Nicole Kindred A, DO  zolpidem (AMBIEN) 5 MG tablet Take 1 tablet (5 mg total) by mouth at bedtime as needed for sleep. 11/06/20  Yes Ezekiel Slocumb, DO  acetaminophen (TYLENOL) 325 MG tablet Take 2 tablets (650 mg total) by mouth every 4 (four) hours as needed for headache or mild pain. 11/06/20   Nicole Kindred A, DO  albuterol (PROVENTIL) (2.5 MG/3ML) 0.083% nebulizer solution Inhale 3 mLs (2.5 mg total) into the lungs every 4 (four) hours as needed for wheezing or shortness of breath. Patient not taking: Reported on 12/04/2020 11/06/20   Ezekiel Slocumb, DO  guaiFENesin (MUCINEX) 600 MG 12 hr tablet Take 1 tablet (600 mg total) by mouth 2 (two) times daily. May take if needed for respiratory congestion Patient not taking: Reported on 12/04/2020 11/20/20   Barrett, Lodema Hong, PA-C  nitroGLYCERIN (NITROSTAT) 0.4 MG SL tablet Place 1 tablet (0.4 mg total) under the tongue every 5 (five) minutes as needed for chest pain. Patient not taking: Reported on 12/04/2020 11/06/20   Nicole Kindred A, DO  polyethylene glycol (MIRALAX / GLYCOLAX) 17 g packet Take 17 g by mouth daily. Patient not taking: Reported on 12/04/2020 11/06/20   Nicole Kindred  A, DO  traMADol (ULTRAM) 50 MG tablet Take 1 tablet (50 mg total) by mouth every 6 (six) hours as needed for moderate pain. Patient not taking: Reported on 12/04/2020 11/20/20   Barrett, [place]   Physical Exam: Vitals:   12/04/20 1221 12/04/20 1528  BP: 113/66 113/64  Pulse: 87 97  Resp: 18 18  Temp: 97.9 F (36.6 C)   TempSrc: Oral   SpO2: 95% 97%  Weight: 73.9 kg   Height: 5\' 10"  (1.778 m)    Constitutional: appears age-appropriate, frail, NAD, calm, comfortable Eyes: PERRL, lids and conjunctivae normal ENMT: Mucous membranes are moist. Posterior pharynx clear of any exudate or lesions. Age-appropriate dentition. Hearing appropriate Neck: normal, supple, no masses, no thyromegaly Respiratory: clear to auscultation bilaterally, no wheezing, no crackles. Normal respiratory effort. No accessory muscle use.  Cardiovascular: Regular rate and rhythm, no murmurs / rubs / gallops. No extremity edema. 2+ pedal pulses. No carotid bruits.  Abdomen: no tenderness, no masses palpated, no hepatosplenomegaly. Bowel sounds positive.  Musculoskeletal: no clubbing / cyanosis. No joint deformity upper and lower extremities. Good ROM, no contractures, no atrophy. Normal muscle tone.  Skin: + rashes, lesions.  CABG wound present.  Vein harvest wound present.      Neurologic: Sensation intact. Strength 5/5 in all 4.  Psychiatric: Normal judgment and insight. Alert and oriented x 3. Normal mood.   EKG: independently reviewed, showing sinus rhythm with rate of 92, QTc 519, right bundle branch block  Chest x-ray on Admission: I personally reviewed and I agree with radiologist reading as below.  DG Chest 2 View  Result Date: 12/04/2020 CLINICAL DATA:  Dizziness and syncope EXAM: CHEST - 2 VIEW COMPARISON:  Chest x-ray dated November 18, 2020 FINDINGS: Cardiac and mediastinal contours are within normal limits. Status post median sternotomy and CABG. Lungs are clear. No evidence of effusion or  pneumothorax. Displaced fracture. IMPRESSION: No acute cardiopulmonary finding. Electronically Signed   By: Yetta Glassman M.D.   On: 12/04/2020 14:34   DG Thoracic Spine 2 View  Result Date: 12/04/2020 CLINICAL  DATA:  Fall EXAM: THORACIC SPINE 2 VIEWS; LUMBAR SPINE - COMPLETE 4+ VIEW COMPARISON:  None. FINDINGS: No fracture or dislocation of the thoracic or lumbar spine. Moderate multilevel disc space height loss and osteophytosis throughout, with bridging osteophytes at many thoracic levels. Mild to moderate facet degenerative change, worst at the lower lumbar levels. Status post median sternotomy and CABG. Nonobstructive pattern of bowel gas. IMPRESSION: 1.  No fracture or dislocation of the thoracic or lumbar spine. 2. Moderate multilevel disc space height loss and osteophytosis throughout, with bridging osteophytes at many thoracic levels. Mild to moderate facet degenerative change, worst at the lower lumbar levels. 3. Disc and neural foraminal pathology may be further evaluated by MRI if indicated by neurologically localizing signs and symptoms. Electronically Signed   By: Delanna Ahmadi M.D.   On: 12/04/2020 14:35   DG Lumbar Spine Complete  Result Date: 12/04/2020 CLINICAL DATA:  Fall EXAM: THORACIC SPINE 2 VIEWS; LUMBAR SPINE - COMPLETE 4+ VIEW COMPARISON:  None. FINDINGS: No fracture or dislocation of the thoracic or lumbar spine. Moderate multilevel disc space height loss and osteophytosis throughout, with bridging osteophytes at many thoracic levels. Mild to moderate facet degenerative change, worst at the lower lumbar levels. Status post median sternotomy and CABG. Nonobstructive pattern of bowel gas. IMPRESSION: 1.  No fracture or dislocation of the thoracic or lumbar spine. 2. Moderate multilevel disc space height loss and osteophytosis throughout, with bridging osteophytes at many thoracic levels. Mild to moderate facet degenerative change, worst at the lower lumbar levels. 3. Disc and  neural foraminal pathology may be further evaluated by MRI if indicated by neurologically localizing signs and symptoms. Electronically Signed   By: Delanna Ahmadi M.D.   On: 12/04/2020 14:35   CT HEAD WO CONTRAST (5MM)  Result Date: 12/04/2020 CLINICAL DATA:  Head trauma EXAM: CT HEAD WITHOUT CONTRAST TECHNIQUE: Contiguous axial images were obtained from the base of the skull through the vertex without intravenous contrast. COMPARISON:  None. FINDINGS: Brain: No evidence of acute infarction, hemorrhage, hydrocephalus, extra-axial collection or mass lesion/mass effect. Mild cortical volume loss. Mild patchy hypodensities in the periventricular and subcortical white matter, likely secondary to chronic microvascular ischemic changes. Vascular: No hyperdense vessel or unexpected calcification. Skull: Normal. Negative for fracture or focal lesion. Sinuses/Orbits: No acute finding. Other: None. IMPRESSION: Chronic changes with no acute intracranial process identified. Electronically Signed   By: Ofilia Neas   On: 12/04/2020 13:20   CT Angio Chest PE W and/or Wo Contrast  Result Date: 12/04/2020 CLINICAL DATA:  PE suspected, fall EXAM: CT ANGIOGRAPHY CHEST WITH CONTRAST TECHNIQUE: Multidetector CT imaging of the chest was performed using the standard protocol during bolus administration of intravenous contrast. Multiplanar CT image reconstructions and MIPs were obtained to evaluate the vascular anatomy. CONTRAST:  77mL OMNIPAQUE IOHEXOL 350 MG/ML SOLN COMPARISON:  None. FINDINGS: Cardiovascular: Satisfactory opacification of the pulmonary arteries to the segmental level. No evidence of pulmonary embolism. Normal heart size. Three-vessel coronary artery calcifications and stents status post median sternotomy and CABG. No pericardial effusion. Aortic atherosclerosis. Mediastinum/Nodes: No enlarged mediastinal, hilar, or axillary lymph nodes. Thyroid gland, trachea, and esophagus demonstrate no significant  findings. Lungs/Pleura: Trace bilateral pleural effusions and associated atelectasis or consolidation. Upper Abdomen: No acute abnormality. Musculoskeletal: No chest wall abnormality. Status post median sternotomy. Review of the MIP images confirms the above findings. IMPRESSION: 1. Negative examination for pulmonary embolism. 2. Trace bilateral pleural effusions and associated atelectasis or consolidation. 3. Coronary artery disease. Aortic Atherosclerosis (  ICD10-I70.0). Electronically Signed   By: Delanna Ahmadi M.D.   On: 12/04/2020 16:51   CT Cervical Spine Wo Contrast  Result Date: 12/04/2020 CLINICAL DATA:  Fall EXAM: CT CERVICAL SPINE WITHOUT CONTRAST TECHNIQUE: Multidetector CT imaging of the cervical spine was performed without intravenous contrast. Multiplanar CT image reconstructions were also generated. COMPARISON:  None. FINDINGS: Alignment: Degenerative straightening and reversal of the normal cervical lordosis. Skull base and vertebrae: No acute fracture. No primary bone lesion or focal pathologic process. Soft tissues and spinal canal: No prevertebral fluid or swelling. No visible canal hematoma. Disc levels: Mild disc space height loss and osteophytosis throughout the cervical spine, with bridging osteophytes at C2-C3 and C5-C6. Upper chest: Minimally displaced fracture of the posterior left first rib, partially imaged (series 2, image 75). Other: None. IMPRESSION: 1. No fracture or static subluxation of the cervical spine. 2. Mild disc space height loss and osteophytosis throughout the cervical spine, with bridging osteophytes at C2-C3 and C5-C6. 3. Minimally displaced fracture of the posterior left first rib, partially imaged. Electronically Signed   By: Delanna Ahmadi M.D.   On: 12/04/2020 14:38    Labs on Admission: I have personally reviewed following labs  CBC: Recent Labs  Lab 12/04/20 1221  WBC 7.2  HGB 12.6*  HCT 38.8*  MCV 84.3  PLT 748*   Basic Metabolic Panel: Recent  Labs  Lab 12/04/20 1221  NA 136  K 5.2*  CL 103  CO2 26  GLUCOSE 130*  BUN 22  CREATININE 1.03  CALCIUM 9.6   GFR: Estimated Creatinine Clearance: 64 mL/min (by C-G formula based on SCr of 1.03 mg/dL).  Liver Function Tests: Recent Labs  Lab 12/04/20 1327  AST 45*  ALT 33  ALKPHOS 88  BILITOT 1.3*  PROT 7.1  ALBUMIN 3.7   Urine analysis:    Component Value Date/Time   COLORURINE YELLOW 11/11/2020 [place] 11/11/2020 1304   LABSPEC 1.006 11/11/2020 1304   PHURINE 7.0 11/11/2020 1304   GLUCOSEU NEGATIVE 11/11/2020 1304   HGBUR NEGATIVE 11/11/2020 1304   BILIRUBINUR NEGATIVE 11/11/2020 1304   BILIRUBINUR neg 04/07/2016 1421   KETONESUR NEGATIVE 11/11/2020 1304   PROTEINUR NEGATIVE 11/11/2020 1304   UROBILINOGEN 0.2 04/07/2016 1421   NITRITE NEGATIVE 11/11/2020 1304   LEUKOCYTESUR NEGATIVE 11/11/2020 1304   Dr. Tobie Poet [facility]  If 7PM-7AM, please contact overnight-coverage provider If 7AM-7PM, please contact day coverage provider [email or web address]  12/04/2020, 5:35 PM

## 2020-12-04 NOTE — Telephone Encounter (Signed)
Noted. Will make Christell Faith, PA aware.

## 2020-12-04 NOTE — Telephone Encounter (Signed)
Patient wants nurse to know he is going to take a cab to the ED after his therapist leaves his home.

## 2020-12-04 NOTE — ED Notes (Signed)
Report to Maureen, RN.

## 2020-12-04 NOTE — Telephone Encounter (Signed)
Called and spoke with pt. Notified of [employer], PA recc below.  Pt voices understanding of recc but states that he has no transportation to the ER as he uses [facility] transportation services.  Pt refuses to call 911 for EMS transport to ER.  Pt reports that he is changing positions slowly, using walker at home.  Pt states that he has transportation already arranged for appt tomorrow 10/26.

## 2020-12-05 ENCOUNTER — Other Ambulatory Visit: Payer: Self-pay

## 2020-12-05 ENCOUNTER — Ambulatory Visit: Payer: Medicare Other | Admitting: Physician Assistant

## 2020-12-05 DIAGNOSIS — I2510 Atherosclerotic heart disease of native coronary artery without angina pectoris: Secondary | ICD-10-CM | POA: Diagnosis not present

## 2020-12-05 DIAGNOSIS — J449 Chronic obstructive pulmonary disease, unspecified: Secondary | ICD-10-CM | POA: Diagnosis not present

## 2020-12-05 DIAGNOSIS — R55 Syncope and collapse: Secondary | ICD-10-CM | POA: Diagnosis not present

## 2020-12-05 DIAGNOSIS — F1011 Alcohol abuse, in remission: Secondary | ICD-10-CM

## 2020-12-05 DIAGNOSIS — R0609 Other forms of dyspnea: Secondary | ICD-10-CM | POA: Diagnosis not present

## 2020-12-05 DIAGNOSIS — I951 Orthostatic hypotension: Principal | ICD-10-CM

## 2020-12-05 DIAGNOSIS — I10 Essential (primary) hypertension: Secondary | ICD-10-CM

## 2020-12-05 LAB — CBG MONITORING, ED: Glucose-Capillary: 120 mg/dL — ABNORMAL HIGH (ref 70–99)

## 2020-12-05 LAB — VITAMIN B12: Vitamin B-12: 775 pg/mL (ref 180–914)

## 2020-12-05 LAB — VITAMIN D 25 HYDROXY (VIT D DEFICIENCY, FRACTURES): Vit D, 25-Hydroxy: 27.42 ng/mL — ABNORMAL LOW (ref 30–100)

## 2020-12-05 MED ORDER — INFLUENZA VAC A&B SA ADJ QUAD 0.5 ML IM PRSY
0.5000 mL | PREFILLED_SYRINGE | INTRAMUSCULAR | Status: AC
  Administered 2020-12-06: 10:00:00 0.5 mL via INTRAMUSCULAR
  Filled 2020-12-05 (×2): qty 0.5

## 2020-12-05 MED ORDER — MIDODRINE HCL 5 MG PO TABS
5.0000 mg | ORAL_TABLET | Freq: Three times a day (TID) | ORAL | Status: DC
  Administered 2020-12-06 – 2020-12-07 (×5): 5 mg via ORAL
  Filled 2020-12-05 (×6): qty 1

## 2020-12-05 MED ORDER — MIDODRINE HCL 5 MG PO TABS
5.0000 mg | ORAL_TABLET | Freq: Two times a day (BID) | ORAL | Status: DC
  Administered 2020-12-05 (×2): 5 mg via ORAL
  Filled 2020-12-05 (×2): qty 1

## 2020-12-05 NOTE — Progress Notes (Signed)
PROGRESS NOTE    Ricky Abbott  KPT:[id number] DOB: 1945-12-10 DOA: 12/04/2020 PCP: Theotis Burrow, MD    Brief Narrative:  Ricky Abbott is a 75 year old male [employer] veteran with past medical history significant for CAD s/p CABG 11/13/2020, essential hypertension, GERD, insomnia, depression/anxiety, hyperlipidemia, COPD, history of EtOH abuse, history of tubular adenoma of colon who presented to [facility] ED on 10/25 following simple episode.  Patient reports he was up at 5 AM on 10/24 when he got up to urinate, and subsequently passed out.  He was getting ready to walk to the bathroom and he woke up in the hallway.  He crawled back to the bedroom and does not know how long he was out.  He also reports chest pain after eating breakfast on 10/24, sharp in nature lasting 10-15 seconds and resolve spontaneously without any specific intervention.  Patient reports chest pain similar to prior to CABG surgery.  Also endorses dizziness since surgery.  Patient reports currently lives alone.  In the ED, temperature 97.9 F, HR 87, RR 18, BP 113/66, SPO2 95% on room air.  Sodium 136, potassium 5.2, chloride 103, bicarb 26, BUN 22, creatinine 1.03, glucose 130.  WBC 7.2, hemoglobin 12.6, platelets 421.  AST 45, ALT 33.  D-dimer 2.23.  High sensitive troponin 6.  Chest x-ray with no acute cardiopulmonary disease process.  CT head without contrast with chronic changes and no acute intracranial process identified.  CT C-spine without fracture or static subluxation, mild disc space height loss and osteophytosis throughout cervical spine, minimally displaced fracture posterior left first rib.  X-ray T/L-spine with no fracture/dislocation, moderate multilevel disc space disease with osteophytes.  CT a chest negative for pulmonary embolism, trace bilateral pleural effusions with associated atelectasis versus consolidation, CAD and aortic atherosclerosis.  COVID-19 PCR negative.  Influenza A/B PCR negative.   Duration consulted for further evaluation management of syncopal episode.   Assessment & Plan:   Principal Problem:   Syncope Active Problems:   CAD, multiple vessel   Hyperlipidemia   Essential hypertension   Tubular adenoma of colon   S/P coronary artery stent placement   History of hepatitis C virus infection   PTSD (post-traumatic stress disorder)   Dyspnea on exertion   COPD, mild (HCC)   Erectile dysfunction   History of alcohol abuse   Unstable angina (HCC)   Malnutrition of moderate degree   Coronary artery disease   Syncope likely secondary to orthostatic hypotension Patient presenting to ED following syncopal episode associated with dizziness and chest discomfort.  Underwent recent CABG 11/13/2020.  CT head and CTA chest unrevealing.  Recently started on low-dose beta-blocker following CABG and midodrine for orthostasis.  Repeat orthostatic vital signs remain positive. --Cardiology following, appreciate assistance --Beta-blocker discontinued --Holding tamsulosin --Midodrine increased to 5 mg p.o. twice daily --Continue orthostatic vital signs q12h --Continue monitor on telemetry  Rib fracture CT C-spine with incidental finding of left posterior first rib fracture, likely secondary to syncopal episode/fall.  No significant pain. --Lidocaine patch  CAD s/p recent CABG Patient underwent CABG x4 on 11/13/2020, by Dr. Kipp Brood.  EKG with normal sinus rhythm, RBBB and nonspecific T wave changes.  Currently denies chest pain. --Holding beta-blocker as above --Aspirin 81 mg p.o. daily --Plavix 75 mg p.o. daily --Crestor 40 mg p.o. daily  HLD: LDL 67 on 10/30/2020. --Crestor 40 mg p.o. nightly, Zetia 10 mg p.o. daily  Prolonged QTC QTC 519 MS on EKG. --Avoid QTC prolonging agents --Monitor on telemetry  BPH: --Continue to hold tamsulosin in the setting of orthostasis  Anxiety/depression: Sertraline 100 mg p.o. daily  RLS: Ropinirole 1 mg p.o. nightly  GERD:  Protonix 40 mg p.o. daily   DVT prophylaxis: enoxaparin (LOVENOX) injection 40 mg Start: 12/04/20 1745 Place TED hose Start: 12/04/20 1733   Code Status: Full Code Family Communication: No family present at bedside this morning  Disposition Plan:  Level of care: Med-Surg Status is: Observation  The patient remains OBS appropriate and will d/c before 2 midnights.       Consultants:  Cardiology  Procedures:  None  Antimicrobials:  None   Subjective: Patient seen examined bedside, resting comfortably.  Continues in ED holding area.  Repeat orthostatic vital signs this morning with associated dizziness and drop in blood pressure.  RN present at bedside.  Will increase his midodrine to 5 mg p.o. twice daily.  Holding tamsulosin and beta-blocker.  No other questions or concerns at this time.  Denies headache, no visual changes, no current chest pain, no palpitations, no shortness of breath, no abdominal pain.  No acute events overnight per nurse staff.  Objective: Vitals:   12/05/20 1100 12/05/20 1500 12/05/20 1530 12/05/20 1730  BP: 117/63 109/66 114/67 113/67  Pulse: 93 75 74 87  Resp: 16 18  16   Temp:      TempSrc:      SpO2: 95% 95% 95% 97%  Weight:      Height:       No intake or output data in the 24 hours ending 12/05/20 1835 Filed Weights   12/04/20 1221  Weight: 73.9 kg    Examination:  General exam: Appears calm and comfortable  Respiratory system: Clear to auscultation. Respiratory effort normal.  On room air Cardiovascular system: S1 & S2 heard, RRR. No JVD, murmurs, rubs, gallops or clicks. No pedal edema. Gastrointestinal system: Abdomen is nondistended, soft and nontender. No organomegaly or masses felt. Normal bowel sounds heard. Central nervous system: Alert and oriented. No focal neurological deficits. Extremities: Symmetric 5 x 5 power. Skin: Surgical incision sites from CABG and vein harvesting noted, healing well otherwise no rashes, lesions or  ulcers Psychiatry: Judgement and insight appear normal. Mood & affect appropriate.     Data Reviewed: I have personally reviewed following labs and imaging studies  CBC: Recent Labs  Lab 12/04/20 1221  WBC 7.2  HGB 12.6*  HCT 38.8*  MCV 84.3  PLT 335*   Basic Metabolic Panel: Recent Labs  Lab 12/04/20 1221 12/04/20 1931  NA 136  --   K 5.2*  --   CL 103  --   CO2 26  --   GLUCOSE 130*  --   BUN 22  --   CREATININE 1.03  --   CALCIUM 9.6  --   MG  --  1.8   GFR: Estimated Creatinine Clearance: 64 mL/min (by C-G formula based on SCr of 1.03 mg/dL). Liver Function Tests: Recent Labs  Lab 12/04/20 1327  AST 45*  ALT 33  ALKPHOS 88  BILITOT 1.3*  PROT 7.1  ALBUMIN 3.7   No results for input(s): LIPASE, AMYLASE in the last 168 hours. No results for input(s): AMMONIA in the last 168 hours. Coagulation Profile: No results for input(s): INR, PROTIME in the last 168 hours. Cardiac Enzymes: No results for input(s): CKTOTAL, CKMB, CKMBINDEX, TROPONINI in the last 168 hours. BNP (last 3 results) No results for input(s): PROBNP in the last 8760 hours. HbA1C: No results for input(s): HGBA1C  in the last 72 hours. CBG: Recent Labs  Lab 12/05/20 0854  GLUCAP 120*   Lipid Profile: No results for input(s): CHOL, HDL, LDLCALC, TRIG, CHOLHDL, LDLDIRECT in the last 72 hours. Thyroid Function Tests: Recent Labs    12/04/20 1931  TSH 1.255   Anemia Panel: Recent Labs    12/04/20 1931  [id number] 149   Sepsis Labs: Recent Labs  Lab 12/04/20 1931  PROCALCITON <0.10    Recent Results (from the past 240 hour(s))  Resp Panel by RT-PCR (Flu A&B, Covid) Nasopharyngeal Swab     Status: None   Collection Time: 12/04/20  6:20 PM   Specimen: Nasopharyngeal Swab; Nasopharyngeal(NP) swabs in vial transport medium  Result Value Ref Range Status   SARS Coronavirus 2 by RT PCR NEGATIVE NEGATIVE Final    Comment: (NOTE) SARS-CoV-2 target nucleic acids are NOT  DETECTED.  The SARS-CoV-2 RNA is generally detectable in upper respiratory specimens during the acute phase of infection. The lowest concentration of SARS-CoV-2 viral copies this assay can detect is 138 copies/mL. A negative result does not preclude SARS-Cov-2 infection and should not be used as the sole basis for treatment or other patient management decisions. A negative result may occur with  improper specimen collection/handling, submission of specimen other than nasopharyngeal swab, presence of viral mutation(s) within the areas targeted by this assay, and inadequate number of viral copies(<138 copies/mL). A negative result must be combined with clinical observations, patient history, and epidemiological information. The expected result is Negative.  Fact Sheet for Patients:  EntrepreneurPulse.com.au  Fact Sheet for Healthcare Providers:  IncredibleEmployment.be  This test is no t yet approved or cleared by the [place] FDA and  has been authorized for detection and/or diagnosis of SARS-CoV-2 by FDA under an Emergency Use Authorization (EUA). This EUA will remain  in effect (meaning this test can be used) for the duration of the COVID-19 declaration under Section 564(b)(1) of the Act, 21 U.S.C.section 360bbb-3(b)(1), unless the authorization is terminated  or revoked sooner.       Influenza A by PCR NEGATIVE NEGATIVE Final   Influenza B by PCR NEGATIVE NEGATIVE Final    Comment: (NOTE) The Xpert Xpress SARS-CoV-2/FLU/RSV plus assay is intended as an aid in the diagnosis of influenza from Nasopharyngeal swab specimens and should not be used as a sole basis for treatment. Nasal washings and aspirates are unacceptable for Xpert Xpress SARS-CoV-2/FLU/RSV testing.  Fact Sheet for Patients: EntrepreneurPulse.com.au  Fact Sheet for Healthcare Providers: IncredibleEmployment.be  This test is not yet  approved or cleared by the [place] FDA and has been authorized for detection and/or diagnosis of SARS-CoV-2 by FDA under an Emergency Use Authorization (EUA). This EUA will remain in effect (meaning this test can be used) for the duration of the COVID-19 declaration under Section 564(b)(1) of the Act, 21 U.S.C. section 360bbb-3(b)(1), unless the authorization is terminated or revoked.  Performed at [facility], [address], [place], [place] [address]          Radiology Studies: DG Chest 2 View  Result Date: 12/04/2020 CLINICAL DATA:  Dizziness and syncope EXAM: CHEST - 2 VIEW COMPARISON:  Chest x-ray dated November 18, 2020 FINDINGS: Cardiac and mediastinal contours are within normal limits. Status post median sternotomy and CABG. Lungs are clear. No evidence of effusion or pneumothorax. Displaced fracture. IMPRESSION: No acute cardiopulmonary finding. Electronically Signed   By: Yetta Glassman M.D.   On: 12/04/2020 14:34   DG Thoracic Spine 2 View  Result Date: 12/04/2020 CLINICAL DATA:  Fall EXAM: THORACIC SPINE 2 VIEWS; LUMBAR SPINE - COMPLETE 4+ VIEW COMPARISON:  None. FINDINGS: No fracture or dislocation of the thoracic or lumbar spine. Moderate multilevel disc space height loss and osteophytosis throughout, with bridging osteophytes at many thoracic levels. Mild to moderate facet degenerative change, worst at the lower lumbar levels. Status post median sternotomy and CABG. Nonobstructive pattern of bowel gas. IMPRESSION: 1.  No fracture or dislocation of the thoracic or lumbar spine. 2. Moderate multilevel disc space height loss and osteophytosis throughout, with bridging osteophytes at many thoracic levels. Mild to moderate facet degenerative change, worst at the lower lumbar levels. 3. Disc and neural foraminal pathology may be further evaluated by MRI if indicated by neurologically localizing signs and symptoms. Electronically Signed   By: Delanna Ahmadi M.D.    On: 12/04/2020 14:35   DG Lumbar Spine Complete  Result Date: 12/04/2020 CLINICAL DATA:  Fall EXAM: THORACIC SPINE 2 VIEWS; LUMBAR SPINE - COMPLETE 4+ VIEW COMPARISON:  None. FINDINGS: No fracture or dislocation of the thoracic or lumbar spine. Moderate multilevel disc space height loss and osteophytosis throughout, with bridging osteophytes at many thoracic levels. Mild to moderate facet degenerative change, worst at the lower lumbar levels. Status post median sternotomy and CABG. Nonobstructive pattern of bowel gas. IMPRESSION: 1.  No fracture or dislocation of the thoracic or lumbar spine. 2. Moderate multilevel disc space height loss and osteophytosis throughout, with bridging osteophytes at many thoracic levels. Mild to moderate facet degenerative change, worst at the lower lumbar levels. 3. Disc and neural foraminal pathology may be further evaluated by MRI if indicated by neurologically localizing signs and symptoms. Electronically Signed   By: Delanna Ahmadi M.D.   On: 12/04/2020 14:35   CT HEAD WO CONTRAST (5MM)  Result Date: 12/04/2020 CLINICAL DATA:  Head trauma EXAM: CT HEAD WITHOUT CONTRAST TECHNIQUE: Contiguous axial images were obtained from the base of the skull through the vertex without intravenous contrast. COMPARISON:  None. FINDINGS: Brain: No evidence of acute infarction, hemorrhage, hydrocephalus, extra-axial collection or mass lesion/mass effect. Mild cortical volume loss. Mild patchy hypodensities in the periventricular and subcortical white matter, likely secondary to chronic microvascular ischemic changes. Vascular: No hyperdense vessel or unexpected calcification. Skull: Normal. Negative for fracture or focal lesion. Sinuses/Orbits: No acute finding. Other: None. IMPRESSION: Chronic changes with no acute intracranial process identified. Electronically Signed   By: Ofilia Neas   On: 12/04/2020 13:20   CT Angio Chest PE W and/or Wo Contrast  Result Date:  12/04/2020 CLINICAL DATA:  PE suspected, fall EXAM: CT ANGIOGRAPHY CHEST WITH CONTRAST TECHNIQUE: Multidetector CT imaging of the chest was performed using the standard protocol during bolus administration of intravenous contrast. Multiplanar CT image reconstructions and MIPs were obtained to evaluate the vascular anatomy. CONTRAST:  89mL OMNIPAQUE IOHEXOL 350 MG/ML SOLN COMPARISON:  None. FINDINGS: Cardiovascular: Satisfactory opacification of the pulmonary arteries to the segmental level. No evidence of pulmonary embolism. Normal heart size. Three-vessel coronary artery calcifications and stents status post median sternotomy and CABG. No pericardial effusion. Aortic atherosclerosis. Mediastinum/Nodes: No enlarged mediastinal, hilar, or axillary lymph nodes. Thyroid gland, trachea, and esophagus demonstrate no significant findings. Lungs/Pleura: Trace bilateral pleural effusions and associated atelectasis or consolidation. Upper Abdomen: No acute abnormality. Musculoskeletal: No chest wall abnormality. Status post median sternotomy. Review of the MIP images confirms the above findings. IMPRESSION: 1. Negative examination for pulmonary embolism. 2. Trace bilateral pleural effusions and associated atelectasis or consolidation. 3. Coronary  artery disease. Aortic Atherosclerosis (ICD10-I70.0). Electronically Signed   By: Delanna Ahmadi M.D.   On: 12/04/2020 16:51   CT Cervical Spine Wo Contrast  Result Date: 12/04/2020 CLINICAL DATA:  Fall EXAM: CT CERVICAL SPINE WITHOUT CONTRAST TECHNIQUE: Multidetector CT imaging of the cervical spine was performed without intravenous contrast. Multiplanar CT image reconstructions were also generated. COMPARISON:  None. FINDINGS: Alignment: Degenerative straightening and reversal of the normal cervical lordosis. Skull base and vertebrae: No acute fracture. No primary bone lesion or focal pathologic process. Soft tissues and spinal canal: No prevertebral fluid or swelling. No  visible canal hematoma. Disc levels: Mild disc space height loss and osteophytosis throughout the cervical spine, with bridging osteophytes at C2-C3 and C5-C6. Upper chest: Minimally displaced fracture of the posterior left first rib, partially imaged (series 2, image 75). Other: None. IMPRESSION: 1. No fracture or static subluxation of the cervical spine. 2. Mild disc space height loss and osteophytosis throughout the cervical spine, with bridging osteophytes at C2-C3 and C5-C6. 3. Minimally displaced fracture of the posterior left first rib, partially imaged. Electronically Signed   By: Delanna Ahmadi M.D.   On: 12/04/2020 14:38   MR BRAIN WO CONTRAST  Result Date: 12/04/2020 CLINICAL DATA:  Initial evaluation for recurrent syncope. EXAM: MRI HEAD WITHOUT CONTRAST TECHNIQUE: Multiplanar, multiecho pulse sequences of the brain and surrounding structures were obtained without intravenous contrast. COMPARISON:  Prior head CT from earlier the same day. FINDINGS: Brain: Diffuse prominence of the CSF containing spaces compatible with generalized age-related cerebral atrophy. Scattered patchy T2/FLAIR hyperintensity within the periventricular and deep white matter both cerebral hemispheres most consistent with chronic small vessel ischemic disease, mild for age. Few scattered superimposed remote lacunar infarcts present about the right hemispheric cerebral white matter. Single subtle focus of diffusion abnormality seen involving the periventricular/deep white matter of the right frontal centrum semi ovale (series 5, image 32). This appears to correspond with a chronic lacunar infarct at this location, consistent with T2 shine through. No other diffusion abnormality to suggest acute or subacute ischemia. Gray-white matter differentiation maintained. No encephalomalacia to suggest chronic cortical infarction. No acute intracranial hemorrhage. No mass lesion, midline shift or mass effect. No hydrocephalus or extra-axial  fluid collection. Pituitary gland suprasellar region within normal limits. Midline structures intact and normal. Vascular: Major intracranial vascular flow voids are maintained. Skull and upper cervical spine: Craniocervical junction within normal limits. Degenerative disc bulge with resultant mild-to-moderate spinal stenosis partially visualized at C3-4 (series 9, image 11). Bone marrow signal intensity within normal limits. No scalp soft tissue abnormality. Sinuses/Orbits: Globes and orbital soft tissues within normal limits. Mild scattered mucosal thickening noted within the ethmoidal air cells. Paranasal sinuses are otherwise clear. Trace left mastoid effusion, of doubtful significance. Inner ear structures grossly normal. Other: None. IMPRESSION: 1. No acute intracranial abnormality. 2. Age-related cerebral atrophy with mild chronic small vessel ischemic disease, with a few scattered remote lacunar infarcts about the hemispheric cerebral white matter. 3. Degenerative disc bulge with resultant mild-to-moderate spinal stenosis at C3-4, partially visualized. Finding could be further assessed with dedicated MRI of the cervical spine as clinically warranted. Electronically Signed   By: Jeannine Boga M.D.   On: 12/04/2020 21:58        Scheduled Meds:  aspirin EC  81 mg Oral Daily   clopidogrel  75 mg Oral Daily   enoxaparin (LOVENOX) injection  40 mg Subcutaneous Q24H   ezetimibe  10 mg Oral Daily   [START ON 12/06/2020] influenza vaccine  adjuvanted  0.5 mL Intramuscular Tomorrow-1000   lidocaine  1 patch Transdermal Q24H   metoprolol tartrate  12.5 mg Oral BID   midodrine  5 mg Oral BID WC   pantoprazole  40 mg Oral Daily   rOPINIRole  1 mg Oral QHS   rosuvastatin  40 mg Oral QHS   sertraline  100 mg Oral Daily   Continuous Infusions:   LOS: 0 days    Time spent: 39 minutes spent on chart review, discussion with nursing staff, consultants, updating family and interview/physical exam;  more than 50% of that time was spent in counseling and/or coordination of care.    Dameer Speiser J [place] DO [facility] Available via Epic secure chat 7am-7pm After these hours, please refer to coverage provider listed on [email or web address] 12/05/2020, 6:35 PM

## 2020-12-05 NOTE — ED Notes (Signed)
[facility] RN aware of assigned bed 

## 2020-12-05 NOTE — Consult Note (Signed)
Cardiology Consultation:   Patient ID: Ricky Abbott MRN: [id number]; DOB: 11-14-1945  Admit date: 12/04/2020 Date of Consult: 12/05/2020  PCP:  Theotis Burrow, MD   [facility] Prov[facility] Cardiologist:  Ida Rogue, MD   {  Patient Profile:   Ricky Abbott is a 75 y.o. male with a hx of CAD s/p remote PCI with MI in 2014 s/p PCI x2 to the LAD in 2014 and recent CABG x 4 11/13/20, DM2, HLD, hepatitis, sarcoidosis of the lung, COPD, prior tobacco use quitting in 1984, PTSD with 3 tours in [place], EtOH, and depression  who is being seen 12/05/2020 for the evaluation of syncope at the request of Dr. [place]  History of Present Illness:   Mr. Ricky Abbott with the above cardiac history.  He was recently admitted 9/27-10 /11 for unstable angina.  Initially presented to the [facility] ED for chest pain.  He was started on IV heparin and Nitropaste with improvement of pain.  Left heart cath showed severe three-vessel CAD.  EF was found to be 55 to 60%.  The patient was transferred to [facility] for evaluation of CT surgery for possible CABG.  He ultimately underwent CABG x4, LIMA to LAD, SVG to OM, SVG to PDA, SVG to diagonal.  He tolerated the procedure well.  Was started on midodrine for low blood pressure.  Started on low-dose metoprolol as well.  Patient was recommended SNF placement but this refused.  He was sent home with PT.  He had a telehealth visit 11/30/2020 with Dr. Kipp Brood.  Patient was overall doing well with some balance issues when he stands up.  The patient presented to [facility] ED 10/25 for syncope. He woke up around 5:30 to go to the bathroom. When he stood up he felt dizzy. Felt a little warm before he fell. He started walking and fell, had LOC. Not sure how long he was out. Felt very weak and could pull himself up initially. When he woke up he crawled back to the bed. Since the surgery he has been having shortness of breath, this in unchanged. No chest pain. No LLE,  orthopnea, pnd. Reports some leg numbness at night. No palpitations. He hit his head on the back. He reports normal PO intake, and good hydration.   In the ER BP 113/66, pulse 87, afebrile, normal O2 sat. labs showed potassium 5.2, sodium 136, bicarb 26, BUN 22, Cr 1.03, BG 130, WBC 7.2, Hgb 12.6, platelets 421, AST 45, ALT 33, D-dimer elevated 3.23, HS trop6. Ct head unremarkable. CT spine unremarkable. Chest CT with no PE. EKG showed NSR, RBBB, TWI V2 and V4, qtc 545ms. Patient was admitted for further work-up.    Past Medical History:  Diagnosis Date   Allergy    Anxiety    Asthma    CAD, multiple vessel    COPD (chronic obstructive pulmonary disease) ([place])    Depression    Diabetes mellitus without complication (HCC)    Dyspnea    GERD (gastroesophageal reflux disease)    History of chicken pox    Hyperlipidemia    Hypertension    MI (myocardial infarction) ([place])    Prostate enlargement    PTSD (post-traumatic stress disorder)    [place] Vet   Sarcoidosis of lung ([place])     Past Surgical History:  Procedure Laterality Date   billary tube placement     [place], [place]  [facility]    CHOLECYSTECTOMY N/A 06/05/2016   Procedure: LAPAROSCOPIC CHOLECYSTECTOMY with cholangiogram;  Surgeon: Jules Husbands, MD;  Location: [facility] ORS;  Service: General;  Laterality: N/A;   CORONARY ARTERY BYPASS GRAFT N/A 11/13/2020   Procedure: CORONARY ARTERY BYPASS GRAFTING (CABG) x 4 ON CARDIOPULMONARY BYPASS USING LIMA AND RIGHT GSV. -LIMA to LAD -SVG to OM -SVG to PDA -SVG to DIAGONAL;  Surgeon: Lajuana Matte, MD;  Location: [place];  Service: Open Heart Surgery;  Laterality: N/A;   [place], 05/2012   x 2   ENDOVEIN HARVEST OF GREATER SAPHENOUS VEIN Right 11/13/2020   Procedure: ENDOVEIN HARVEST OF GREATER SAPHENOUS VEIN;  Surgeon: Lajuana Matte, MD;  Location: [place];  Service: Open Heart Surgery;   Laterality: Right;   IR GENERIC HISTORICAL  04/30/2016   IR PERC CHOLECYSTOSTOMY 04/30/2016 [facility]-INTERV RAD   LEFT HEART CATH AND CORONARY ANGIOGRAPHY Left 10/06/2019   Procedure: LEFT HEART CATH AND CORONARY ANGIOGRAPHY;  Surgeon: Minna Merritts, MD;  Location: [place] CV [facility];  Service: Cardiovascular;  Laterality: Left;   LEFT HEART CATH AND CORONARY ANGIOGRAPHY N/A 11/06/2020   Procedure: LEFT HEART CATH AND CORONARY ANGIOGRAPHY;  Surgeon: Nelva Bush, MD;  Location: [place] CV [facility];  Service: Cardiovascular;  Laterality: N/A;   TEE WITHOUT CARDIOVERSION N/A 11/13/2020   Procedure: TRANSESOPHAGEAL ECHOCARDIOGRAM (TEE);  Surgeon: Lajuana Matte, MD;  Location: [place];  Service: Open Heart Surgery;  Laterality: N/A;     Home Medications:  Prior to Admission medications   Medication Sig Start Date End Date Taking? Authorizing Provider  aspirin EC 81 MG EC tablet Take 1 tablet (81 mg total) by mouth daily. Swallow whole. 11/07/20  Yes Nicole Kindred A, DO  clopidogrel (PLAVIX) 75 MG tablet Take 1 tablet (75 mg total) by mouth daily. 11/20/20  Yes Barrett, Erin R, PA-C  ezetimibe (ZETIA) 10 MG tablet Take 1 tablet (10 mg total) by mouth daily. 11/07/20  Yes Nicole Kindred A, DO  metoprolol tartrate (LOPRESSOR) 25 MG tablet Take 1/2 tablet (12.5 mg total) by mouth 2 (two) times daily. 11/20/20  Yes Barrett, Erin R, PA-C  midodrine (PROAMATINE) 2.5 MG tablet Take 1 tablet (2.5 mg total) by mouth 3 (three) times daily with meals. X 7 days, then decrease to 2 times per day Patient taking differently: Take 1.25 mg by mouth 3 (three) times daily with meals. X 7 days, then decrease to 2 times per day 11/20/20  Yes Barrett, Erin R, PA-C  pantoprazole (PROTONIX) 40 MG tablet Take 1 tablet (40 mg total) by mouth daily. 11/07/20  Yes Nicole Kindred A, DO  rosuvastatin (CRESTOR) 40 MG tablet Take 1 tablet (40 mg total) by mouth daily. 11/07/20  Yes Nicole Kindred A, DO  sertraline (ZOLOFT)  100 MG tablet Take 1 tablet (100 mg total) by mouth daily. 11/07/20  Yes Nicole Kindred A, DO  tamsulosin (FLOMAX) 0.4 MG CAPS capsule Take 1 capsule (0.4 mg total) by mouth daily. 11/07/20  Yes Nicole Kindred A, DO  zolpidem (AMBIEN) 5 MG tablet Take 1 tablet (5 mg total) by mouth at bedtime as needed for sleep. 11/06/20  Yes Ezekiel Slocumb, DO  acetaminophen (TYLENOL) 325 MG tablet Take 2 tablets (650 mg total) by mouth every 4 (four) hours as needed for headache or mild pain. 11/06/20   Nicole Kindred A, DO  albuterol (PROVENTIL) (2.5 MG/3ML) 0.083% nebulizer solution Inhale 3 mLs (2.5 mg total) into the lungs  every 4 (four) hours as needed for wheezing or shortness of breath. Patient not taking: Reported on 12/04/2020 11/06/20   Ezekiel Slocumb, DO  guaiFENesin (MUCINEX) 600 MG 12 hr tablet Take 1 tablet (600 mg total) by mouth 2 (two) times daily. May take if needed for respiratory congestion Patient not taking: Reported on 12/04/2020 11/20/20   Barrett, Lodema Hong, PA-C  nitroGLYCERIN (NITROSTAT) 0.4 MG SL tablet Place 1 tablet (0.4 mg total) under the tongue every 5 (five) minutes as needed for chest pain. Patient not taking: Reported on 12/04/2020 11/06/20   Nicole Kindred A, DO  polyethylene glycol (MIRALAX / GLYCOLAX) 17 g packet Take 17 g by mouth daily. Patient not taking: Reported on 12/04/2020 11/06/20   Nicole Kindred A, DO  traMADol (ULTRAM) 50 MG tablet Take 1 tablet (50 mg total) by mouth every 6 (six) hours as needed for moderate pain. Patient not taking: Reported on 12/04/2020 11/20/20   Barrett, Lodema Hong, PA-C    Inpatient Medications: Scheduled Meds:  aspirin EC  81 mg Oral Daily   clopidogrel  75 mg Oral Daily   enoxaparin (LOVENOX) injection  40 mg Subcutaneous Q24H   ezetimibe  10 mg Oral Daily   lidocaine  1 patch Transdermal Q24H   metoprolol tartrate  12.5 mg Oral BID   midodrine  1.25 mg Oral TID WC   pantoprazole  40 mg Oral Daily   rOPINIRole  1 mg Oral QHS    rosuvastatin  40 mg Oral QHS   sertraline  100 mg Oral Daily   Continuous Infusions:  PRN Meds: acetaminophen **OR** acetaminophen, albuterol, nitroGLYCERIN, ondansetron **OR** ondansetron (ZOFRAN) IV, zolpidem  Allergies:    Allergies  Allergen Reactions   Peanut-Containing Drug Products Swelling    Swelling, rash, (Peanuts)    Social History:   Social History   Socioeconomic History   Marital status: Single    Spouse name: Not on file   Number of children: Not on file   Years of education: Not on file   Highest education level: Not on file  Occupational History   Not on file  Tobacco Use   Smoking status: Former    Packs/day: 4.00    Years: 25.00    Pack years: 100.00    Types: Cigarettes    Quit date: 03/24/1981    Years since quitting: 39.7   Smokeless tobacco: Never   Tobacco comments:    quit 30 years asgo  Vaping Use   Vaping Use: Never used  Substance and Sexual Activity   Alcohol use: No   Drug use: No   Sexual activity: Never  Other Topics Concern   Not on file  Social History Narrative   Yvone Neu grew up in [place], [place]. He moved to the [place] area in 2013. 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   Social Determinants of Health   Financial Resource Strain: Not on file  Food Insecurity: Not on file  Transportation Needs: Not on file  Physical Activity: Not on file  Stress: Not on file  Social Connections: Not on file  Intimate Partner Violence: Not on file    Family History:    Family History  Problem Relation Age of Onset   Cancer Mother    Hyperlipidemia Mother    Hypertension Mother    Cancer Father        stomach   Hyperlipidemia Father    Hypertension Father    Cancer Brother        bone  cancer     ROS:  Please see the history of present illness.   All other ROS reviewed and negative.     Physical Exam/Data:   Vitals:   12/05/20 0600 12/05/20 0630 12/05/20 0700 12/05/20 0800  BP: 104/61 110/64 112/64 116/69  Pulse: 88 90 85  94  Resp:  18    Temp:      TempSrc:      SpO2: 92% 93% 92% 94%  Weight:      Height:       No intake or output data in the 24 hours ending 12/05/20 0902 Last 3 Weights 12/04/2020 11/28/2020 11/20/2020  Weight (lbs) 163 lb 168 lb 180 lb 12.4 oz  Weight (kg) 73.936 kg 76.204 kg 82 kg     Body mass index is 23.39 kg/m.  General:  Well nourished, well developed, in no acute distress HEENT: normal Neck: no JVD Vascular: No carotid bruits; Distal pulses 2+ bilaterally Cardiac:  normal S1, S2; RRR; no murmur  Lungs:  clear to auscultation bilaterally, no wheezing, rhonchi or rales  Abd: soft, nontender, no hepatomegaly  Ext: no edema Musculoskeletal:  No deformities, BUE and BLE strength normal and equal Skin: warm and dry  Neuro:  CNs 2-12 intact, no focal abnormalities noted Psych:  Normal affect   EKG:  The EKG was personally reviewed and demonstrates:   Telemetry:  Telemetry was personally reviewed and demonstrates:  NSR, HR 80-90s  Relevant CV Studies:  Echo intraoperative TEE 82/9562 Complications: No known complications during this procedure.  POST-OP IMPRESSIONS  _ Left Ventricle: has mildly reduced systolic function. The cavity size  was  normal. The wall motion is normal.  _ Right Ventricle: The right ventricle appears unchanged from pre-bypass.  _ Aorta: The aorta appears unchanged from pre-bypass.  _ Left Atrial Appendage: The left atrial appendage appears unchanged from  pre-bypass.  _ Aortic Valve: The aortic valve appears unchanged from pre-bypass.  _ Mitral Valve: The mitral valve appears unchanged from pre-bypass.  _ Tricuspid Valve: The tricuspid valve appears unchanged from pre-bypass.  _ Interatrial Septum: The interatrial septum appears unchanged from  pre-bypass.  _ Pericardium: The pericardium appears unchanged from pre-bypass.  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 No new or  worsening  wall motion or valvular abnormalities.  Mildly reduced EF 45-50% unchanged.   Echo 10/2020 1. Distal septal and inferior hypokinesis . Left ventricular ejection  fraction, by estimation, is 45 to 50%. The left ventricle has mildly  decreased function. The left ventricle demonstrates regional wall motion  abnormalities (see scoring  diagram/findings for description). There is mild left ventricular  hypertrophy. Left ventricular diastolic parameters were normal.   2. Right ventricular systolic function is normal. The right ventricular  size is normal.   3. The mitral valve is abnormal. Trivial mitral valve regurgitation. No  evidence of mitral stenosis.   4. The aortic valve is tricuspid. Aortic valve regurgitation is not  visualized. Mild to moderate aortic valve sclerosis/calcification is  present, without any evidence of aortic stenosis.   5. The inferior vena cava is normal in size with greater than 50%  respiratory variability, suggesting right atrial pressure of 3 mmHg.   Cardiac cath 10/2020 Conclusions: Severe three-vessel coronary artery disease, as detailed below. Low normal left ventricular systolic function (LVEF 13-08%) with mid and apical inferior hypokinesis.  Mildly elevated left ventricular filling pressure (LVEDP 20-25 mmHg).   Recommendations: Transfer to [facility] for cardiac surgery consultation.  Hold clopidogrel pending surgical consultation. Restart IV heparin 2 hours after TR band removal. Aggressive secondary prevention.   Nelva Bush, MD [facility]  Laboratory Data:  High Sensitivity Troponin:   Recent Labs  Lab 11/05/20 2152 11/06/20 0003 11/09/20 0207 12/04/20 1327  TROPONINIHS 5 9 26* 6     Chemistry Recent Labs  Lab 12/04/20 1221 12/04/20 1931  NA 136  --   K 5.2*  --   CL 103  --   CO2 26  --   GLUCOSE 130*  --   BUN 22  --   CREATININE 1.03  --   CALCIUM 9.6  --   MG  --  1.8  GFRNONAA >60  --   ANIONGAP 7  --     Recent Labs  Lab 12/04/20 1327  PROT 7.1   ALBUMIN 3.7  AST 45*  ALT 33  ALKPHOS 88  BILITOT 1.3*   Lipids No results for input(s): CHOL, TRIG, HDL, LABVLDL, LDLCALC, CHOLHDL in the last 168 hours.  Hematology Recent Labs  Lab 12/04/20 1221  WBC 7.2  RBC 4.60  HGB 12.6*  HCT 38.8*  MCV 84.3  MCH 27.4  MCHC 32.5  RDW 14.9  PLT 421*   Thyroid  Recent Labs  Lab 12/04/20 1931  TSH 1.255    BNP Recent Labs  Lab 12/04/20 1931  BNP 163.5*    DDimer  Recent Labs  Lab 12/04/20 1327  DDIMER 3.23*     Radiology/Studies:  DG Chest 2 View  Result Date: 12/04/2020 CLINICAL DATA:  Dizziness and syncope EXAM: CHEST - 2 VIEW COMPARISON:  Chest x-ray dated November 18, 2020 FINDINGS: Cardiac and mediastinal contours are within normal limits. Status post median sternotomy and CABG. Lungs are clear. No evidence of effusion or pneumothorax. Displaced fracture. IMPRESSION: No acute cardiopulmonary finding. Electronically Signed   By: Yetta Glassman M.D.   On: 12/04/2020 14:34   DG Thoracic Spine 2 View  Result Date: 12/04/2020 CLINICAL DATA:  Fall EXAM: THORACIC SPINE 2 VIEWS; LUMBAR SPINE - COMPLETE 4+ VIEW COMPARISON:  None. FINDINGS: No fracture or dislocation of the thoracic or lumbar spine. Moderate multilevel disc space height loss and osteophytosis throughout, with bridging osteophytes at many thoracic levels. Mild to moderate facet degenerative change, worst at the lower lumbar levels. Status post median sternotomy and CABG. Nonobstructive pattern of bowel gas. IMPRESSION: 1.  No fracture or dislocation of the thoracic or lumbar spine. 2. Moderate multilevel disc space height loss and osteophytosis throughout, with bridging osteophytes at many thoracic levels. Mild to moderate facet degenerative change, worst at the lower lumbar levels. 3. Disc and neural foraminal pathology may be further evaluated by MRI if indicated by neurologically localizing signs and symptoms. Electronically Signed   By: Delanna Ahmadi M.D.   On:  12/04/2020 14:35   DG Lumbar Spine Complete  Result Date: 12/04/2020 CLINICAL DATA:  Fall EXAM: THORACIC SPINE 2 VIEWS; LUMBAR SPINE - COMPLETE 4+ VIEW COMPARISON:  None. FINDINGS: No fracture or dislocation of the thoracic or lumbar spine. Moderate multilevel disc space height loss and osteophytosis throughout, with bridging osteophytes at many thoracic levels. Mild to moderate facet degenerative change, worst at the lower lumbar levels. Status post median sternotomy and CABG. Nonobstructive pattern of bowel gas. IMPRESSION: 1.  No fracture or dislocation of the thoracic or lumbar spine. 2. Moderate multilevel disc space height loss and osteophytosis throughout, with bridging osteophytes at many thoracic levels. Mild to moderate facet degenerative change, worst  at the lower lumbar levels. 3. Disc and neural foraminal pathology may be further evaluated by MRI if indicated by neurologically localizing signs and symptoms. Electronically Signed   By: Delanna Ahmadi M.D.   On: 12/04/2020 14:35   CT HEAD WO CONTRAST (5MM)  Result Date: 12/04/2020 CLINICAL DATA:  Head trauma EXAM: CT HEAD WITHOUT CONTRAST TECHNIQUE: Contiguous axial images were obtained from the base of the skull through the vertex without intravenous contrast. COMPARISON:  None. FINDINGS: Brain: No evidence of acute infarction, hemorrhage, hydrocephalus, extra-axial collection or mass lesion/mass effect. Mild cortical volume loss. Mild patchy hypodensities in the periventricular and subcortical white matter, likely secondary to chronic microvascular ischemic changes. Vascular: No hyperdense vessel or unexpected calcification. Skull: Normal. Negative for fracture or focal lesion. Sinuses/Orbits: No acute finding. Other: None. IMPRESSION: Chronic changes with no acute intracranial process identified. Electronically Signed   By: Ofilia Neas   On: 12/04/2020 13:20   CT Angio Chest PE W and/or Wo Contrast  Result Date: 12/04/2020 CLINICAL  DATA:  PE suspected, fall EXAM: CT ANGIOGRAPHY CHEST WITH CONTRAST TECHNIQUE: Multidetector CT imaging of the chest was performed using the standard protocol during bolus administration of intravenous contrast. Multiplanar CT image reconstructions and MIPs were obtained to evaluate the vascular anatomy. CONTRAST:  61mL OMNIPAQUE IOHEXOL 350 MG/ML SOLN COMPARISON:  None. FINDINGS: Cardiovascular: Satisfactory opacification of the pulmonary arteries to the segmental level. No evidence of pulmonary embolism. Normal heart size. Three-vessel coronary artery calcifications and stents status post median sternotomy and CABG. No pericardial effusion. Aortic atherosclerosis. Mediastinum/Nodes: No enlarged mediastinal, hilar, or axillary lymph nodes. Thyroid gland, trachea, and esophagus demonstrate no significant findings. Lungs/Pleura: Trace bilateral pleural effusions and associated atelectasis or consolidation. Upper Abdomen: No acute abnormality. Musculoskeletal: No chest wall abnormality. Status post median sternotomy. Review of the MIP images confirms the above findings. IMPRESSION: 1. Negative examination for pulmonary embolism. 2. Trace bilateral pleural effusions and associated atelectasis or consolidation. 3. Coronary artery disease. Aortic Atherosclerosis (ICD10-I70.0). Electronically Signed   By: Delanna Ahmadi M.D.   On: 12/04/2020 16:51   CT Cervical Spine Wo Contrast  Result Date: 12/04/2020 CLINICAL DATA:  Fall EXAM: CT CERVICAL SPINE WITHOUT CONTRAST TECHNIQUE: Multidetector CT imaging of the cervical spine was performed without intravenous contrast. Multiplanar CT image reconstructions were also generated. COMPARISON:  None. FINDINGS: Alignment: Degenerative straightening and reversal of the normal cervical lordosis. Skull base and vertebrae: No acute fracture. No primary bone lesion or focal pathologic process. Soft tissues and spinal canal: No prevertebral fluid or swelling. No visible canal hematoma.  Disc levels: Mild disc space height loss and osteophytosis throughout the cervical spine, with bridging osteophytes at C2-C3 and C5-C6. Upper chest: Minimally displaced fracture of the posterior left first rib, partially imaged (series 2, image 75). Other: None. IMPRESSION: 1. No fracture or static subluxation of the cervical spine. 2. Mild disc space height loss and osteophytosis throughout the cervical spine, with bridging osteophytes at C2-C3 and C5-C6. 3. Minimally displaced fracture of the posterior left first rib, partially imaged. Electronically Signed   By: Delanna Ahmadi M.D.   On: 12/04/2020 14:38   MR BRAIN WO CONTRAST  Result Date: 12/04/2020 CLINICAL DATA:  Initial evaluation for recurrent syncope. EXAM: MRI HEAD WITHOUT CONTRAST TECHNIQUE: Multiplanar, multiecho pulse sequences of the brain and surrounding structures were obtained without intravenous contrast. COMPARISON:  Prior head CT from earlier the same day. FINDINGS: Brain: Diffuse prominence of the CSF containing spaces compatible with generalized age-related cerebral atrophy.  Scattered patchy T2/FLAIR hyperintensity within the periventricular and deep white matter both cerebral hemispheres most consistent with chronic small vessel ischemic disease, mild for age. Few scattered superimposed remote lacunar infarcts present about the right hemispheric cerebral white matter. Single subtle focus of diffusion abnormality seen involving the periventricular/deep white matter of the right frontal centrum semi ovale (series 5, image 32). This appears to correspond with a chronic lacunar infarct at this location, consistent with T2 shine through. No other diffusion abnormality to suggest acute or subacute ischemia. Gray-white matter differentiation maintained. No encephalomalacia to suggest chronic cortical infarction. No acute intracranial hemorrhage. No mass lesion, midline shift or mass effect. No hydrocephalus or extra-axial fluid collection.  Pituitary gland suprasellar region within normal limits. Midline structures intact and normal. Vascular: Major intracranial vascular flow voids are maintained. Skull and upper cervical spine: Craniocervical junction within normal limits. Degenerative disc bulge with resultant mild-to-moderate spinal stenosis partially visualized at C3-4 (series 9, image 11). Bone marrow signal intensity within normal limits. No scalp soft tissue abnormality. Sinuses/Orbits: Globes and orbital soft tissues within normal limits. Mild scattered mucosal thickening noted within the ethmoidal air cells. Paranasal sinuses are otherwise clear. Trace left mastoid effusion, of doubtful significance. Inner ear structures grossly normal. Other: None. IMPRESSION: 1. No acute intracranial abnormality. 2. Age-related cerebral atrophy with mild chronic small vessel ischemic disease, with a few scattered remote lacunar infarcts about the hemispheric cerebral white matter. 3. Degenerative disc bulge with resultant mild-to-moderate spinal stenosis at C3-4, partially visualized. Finding could be further assessed with dedicated MRI of the cervical spine as clinically warranted. Electronically Signed   By: Jeannine Boga M.D.   On: 12/04/2020 21:58     Assessment and Plan:   Syncope - presented with syncope/LOC that sounds orthostatic in nature - He was discharged 10/11 on midodrine and low dose BB.  - MRI brain non-acute.  - CT chest with no PE - orthostatics positive - TSH wnl - B12 wnl - Recent echo showed LVEF 45-50%, which was unchanged - h/o hypotension on midodrine. Also on lopressor. Suspect orthostatic hypotension as above. Stop BB and continue midodrine, can reevaluate need for BB in OP  - Discussed good hydration, compression socks and abdominal binder - Plan for heart monitor at discharge.   CAD s/p recent CABG with prior stenting - s/p CABG x4 11/13/20 - EKG with NSR, RBBB and nonspecific T Wave changes - Denies  chest pain. He reports unchanged SOB since the surgery - continue ASA, plavix, statin - hold BB as above - No further ischemic work-up at this time  HLD - LDL 67 10/2020 - continue rosuvastatin  QT prolongation - Qtc 562ms by EKG this admission - Prior Qtc 446ms - avoid Qtc prolonging agents - re-check today  BPH - flomax held   For questions or updates, please contact [facility] Please consult [email or web address] for contact info under    Signed, Vale Peraza Ninfa Meeker, PA-C  12/05/2020 9:02 AM

## 2020-12-06 ENCOUNTER — Other Ambulatory Visit: Payer: Self-pay

## 2020-12-06 ENCOUNTER — Encounter: Payer: Self-pay | Admitting: Internal Medicine

## 2020-12-06 ENCOUNTER — Observation Stay (HOSPITAL_COMMUNITY)
Admit: 2020-12-06 | Discharge: 2020-12-06 | Disposition: A | Discharge status: Home / Self Care | Payer: Medicare Other | Attending: Physician Assistant | Admitting: Physician Assistant

## 2020-12-06 DIAGNOSIS — R55 Syncope and collapse: Secondary | ICD-10-CM

## 2020-12-06 DIAGNOSIS — I200 Unstable angina: Secondary | ICD-10-CM

## 2020-12-06 DIAGNOSIS — I2510 Atherosclerotic heart disease of native coronary artery without angina pectoris: Secondary | ICD-10-CM | POA: Diagnosis not present

## 2020-12-06 DIAGNOSIS — E119 Type 2 diabetes mellitus without complications: Secondary | ICD-10-CM | POA: Diagnosis present

## 2020-12-06 DIAGNOSIS — Z6823 Body mass index (BMI) 23.0-23.9, adult: Secondary | ICD-10-CM | POA: Diagnosis not present

## 2020-12-06 DIAGNOSIS — N400 Enlarged prostate without lower urinary tract symptoms: Secondary | ICD-10-CM | POA: Diagnosis present

## 2020-12-06 DIAGNOSIS — D860 Sarcoidosis of lung: Secondary | ICD-10-CM | POA: Diagnosis present

## 2020-12-06 DIAGNOSIS — G2581 Restless legs syndrome: Secondary | ICD-10-CM | POA: Diagnosis present

## 2020-12-06 DIAGNOSIS — I10 Essential (primary) hypertension: Secondary | ICD-10-CM | POA: Diagnosis present

## 2020-12-06 DIAGNOSIS — E785 Hyperlipidemia, unspecified: Secondary | ICD-10-CM | POA: Diagnosis present

## 2020-12-06 DIAGNOSIS — Z8619 Personal history of other infectious and parasitic diseases: Secondary | ICD-10-CM

## 2020-12-06 DIAGNOSIS — Z79899 Other long term (current) drug therapy: Secondary | ICD-10-CM | POA: Diagnosis not present

## 2020-12-06 DIAGNOSIS — F32A Depression, unspecified: Secondary | ICD-10-CM | POA: Diagnosis present

## 2020-12-06 DIAGNOSIS — W19XXXA Unspecified fall, initial encounter: Secondary | ICD-10-CM | POA: Diagnosis present

## 2020-12-06 DIAGNOSIS — E440 Moderate protein-calorie malnutrition: Secondary | ICD-10-CM | POA: Diagnosis present

## 2020-12-06 DIAGNOSIS — N529 Male erectile dysfunction, unspecified: Secondary | ICD-10-CM | POA: Diagnosis present

## 2020-12-06 DIAGNOSIS — F1010 Alcohol abuse, uncomplicated: Secondary | ICD-10-CM | POA: Diagnosis present

## 2020-12-06 DIAGNOSIS — I252 Old myocardial infarction: Secondary | ICD-10-CM | POA: Diagnosis not present

## 2020-12-06 DIAGNOSIS — Z7902 Long term (current) use of antithrombotics/antiplatelets: Secondary | ICD-10-CM | POA: Diagnosis not present

## 2020-12-06 DIAGNOSIS — J449 Chronic obstructive pulmonary disease, unspecified: Secondary | ICD-10-CM | POA: Diagnosis present

## 2020-12-06 DIAGNOSIS — Z7982 Long term (current) use of aspirin: Secondary | ICD-10-CM | POA: Diagnosis not present

## 2020-12-06 DIAGNOSIS — D126 Benign neoplasm of colon, unspecified: Secondary | ICD-10-CM

## 2020-12-06 DIAGNOSIS — K219 Gastro-esophageal reflux disease without esophagitis: Secondary | ICD-10-CM | POA: Diagnosis present

## 2020-12-06 DIAGNOSIS — S2232XA Fracture of one rib, left side, initial encounter for closed fracture: Secondary | ICD-10-CM | POA: Diagnosis present

## 2020-12-06 DIAGNOSIS — R42 Dizziness and giddiness: Secondary | ICD-10-CM | POA: Diagnosis not present

## 2020-12-06 DIAGNOSIS — Z20822 Contact with and (suspected) exposure to covid-19: Secondary | ICD-10-CM | POA: Diagnosis present

## 2020-12-06 DIAGNOSIS — I951 Orthostatic hypotension: Secondary | ICD-10-CM | POA: Diagnosis present

## 2020-12-06 DIAGNOSIS — G4700 Insomnia, unspecified: Secondary | ICD-10-CM | POA: Diagnosis present

## 2020-12-06 DIAGNOSIS — F4310 Post-traumatic stress disorder, unspecified: Secondary | ICD-10-CM | POA: Diagnosis present

## 2020-12-06 DIAGNOSIS — R0609 Other forms of dyspnea: Secondary | ICD-10-CM | POA: Diagnosis not present

## 2020-12-06 DIAGNOSIS — Z23 Encounter for immunization: Secondary | ICD-10-CM | POA: Diagnosis present

## 2020-12-06 LAB — ECHOCARDIOGRAM COMPLETE
AR max vel: 2.86 cm2
AV Area VTI: 3.89 cm2
AV Area mean vel: 3.17 cm2
AV Mean grad: 1 mmHg
AV Peak grad: 2.6 mmHg
Ao pk vel: 0.8 m/s
Area-P 1/2: 3.91 cm2
Height: 70 in
MV VTI: 3.02 cm2
Weight: 2617.3 oz

## 2020-12-06 LAB — BASIC METABOLIC PANEL
Anion gap: 5 (ref 5–15)
BUN: 15 mg/dL (ref 8–23)
CO2: 27 mmol/L (ref 22–32)
Calcium: 9 mg/dL (ref 8.9–10.3)
Chloride: 107 mmol/L (ref 98–111)
Creatinine, Ser: 0.92 mg/dL (ref 0.61–1.24)
GFR, Estimated: >60 mL/min (ref >60)
Glucose, Bld: 179 mg/dL — ABNORMAL HIGH (ref 70–99)
Potassium: 4.3 mmol/L (ref 3.5–5.1)
Sodium: 139 mmol/L (ref 135–145)

## 2020-12-06 LAB — GLUCOSE, CAPILLARY: Glucose-Capillary: 175 mg/dL — ABNORMAL HIGH (ref 70–99)

## 2020-12-06 LAB — MAGNESIUM: Magnesium: 1.8 mg/dL (ref 1.7–2.4)

## 2020-12-06 MED ORDER — SODIUM CHLORIDE 0.9 % IV SOLN: INTRAVENOUS | Status: AC

## 2020-12-06 MED ORDER — SODIUM CHLORIDE 0.9 % IV BOLUS
1000.0000 mL | Freq: Once | INTRAVENOUS | Status: AC
  Administered 2020-12-06: 11:00:00 1000 mL via INTRAVENOUS

## 2020-12-06 MED ORDER — VITAMIN D 25 MCG (1000 UNIT) PO TABS
1000.0000 [IU] | ORAL_TABLET | Freq: Every day | ORAL | Status: DC
  Administered 2020-12-06 – 2020-12-07 (×2): 1000 [IU] via ORAL
  Filled 2020-12-06 (×2): qty 1

## 2020-12-06 MED ORDER — MAGNESIUM SULFATE 2 GM/50ML IV SOLN
2.0000 g | Freq: Once | INTRAVENOUS | Status: AC
  Administered 2020-12-06: 08:00:00 2 g via INTRAVENOUS
  Filled 2020-12-06: qty 50

## 2020-12-06 NOTE — Progress Notes (Signed)
Met patient while rounding. He shared the event that brought him here. He states he is feeling much better but admits it was scary not knowing how long he was unconscious. Provided presence and support to the patient.

## 2020-12-06 NOTE — Progress Notes (Signed)
*  PRELIMINARY RESULTS* Echocardiogram 2D Echocardiogram has been performed.  Ricky Abbott 12/06/2020, 2:31 PM

## 2020-12-06 NOTE — Evaluation (Signed)
Occupational Therapy Evaluation Patient Details Name: Ricky Abbott MRN: [id number] DOB: [birth year] Today's Date: 12/06/2020   History of Present Illness Pt is a 75 y/o male admitted 9/26 secondary to worsening chest pain.Thought to be from unstable angina with 3 vessel CAD. 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 PMH includes COPD, DM, CAD, HTN, and sarcoidosis.   Clinical Impression   Pt was seen for OT evaluation this date. Prior to hospital admission, pt was living alone in an apartment (home ~7mo from recent heart surgery and receiving [place] services). Pt does not drive since surgery and has medications and groceries delivered, receives Meals on Wheels, and uses [employer] for transportation to/from doctor appointments. Currently pt demonstrates impairments in activity tolerance, higher level balance, and cardiopulmonary status (recently orthostatic) as described below (See OT problem list) which functionally limit his ability to perform ADL/self-care tasks. Pt currently requires supervision/SBA for ADL transfers, LB ADL, and toileting. Sitting BP 118/64 HR 84, standing 40min BP 123/64 HR 101, standing 62min 128/59 HR 109, marching in place ~77min 114/57 HR 117. Denied dizziness. Pt instructed in compression stocking mgt and pt able to return demo to don stocking with good technique. Pt instructed in energy conservation strategies, use of a shower chair for seated showers, strategies to minimize orthostatic hypotension particularly during positional changes and when showering, and falls prevention. Pt verbalized understanding.  Pt would benefit from skilled OT services to address noted impairments and functional limitations (see below for any additional details) in order to maximize safety and independence while minimizing falls risk and caregiver burden. Upon hospital discharge, recommend HHOT to maximize pt safety and return to functional independence during meaningful occupations of daily life. TOC notified.    Recommendations for follow up therapy are one component of a multi-disciplinary discharge planning process, led by the attending physician.  Recommendations may be updated based on patient status, additional functional criteria and insurance authorization.   Follow Up Recommendations  Home health OT    Assistance Recommended at Discharge None  Functional Status Assessment  Patient has had a recent decline in their functional status and demonstrates the ability to make significant improvements in function in a reasonable and predictable amount of time.  Equipment Recommendations  Tub/shower seat    Recommendations for Other Services       Precautions / Restrictions Precautions Precautions: Fall Restrictions Weight Bearing Restrictions: No      Mobility Bed Mobility Overal bed mobility: Needs Assistance Bed Mobility: Sidelying to Sit   Sidelying to sit: Supervision       General bed mobility comments: NT up in recliner    Transfers Overall transfer level: Needs assistance Equipment used: None Transfers: Sit to/from Stand Sit to Stand: Min guard;Supervision Stand pivot transfers: Min guard;Supervision         General transfer comment: Pt with good balance, ambulating with no AD.      Balance Overall balance assessment: Mild deficits observed, not formally tested;History of Falls Sitting-balance support: No upper extremity supported;Feet supported Sitting balance-Leahy Scale: Good     Standing balance support: No upper extremity supported Standing balance-Leahy Scale: Poor                             ADL either performed or assessed with clinical judgement   ADL Overall ADL's : Needs assistance/impaired     Grooming: Standing;Supervision/safety   Upper Body Bathing: Sitting;Modified independent   Lower Body Bathing: Sit to/from stand;Supervison/  safety;Set up   Upper Body Dressing : Sitting;Modified independent   Lower Body Dressing:  Sit to/from stand;Set up;Supervision/safety Lower Body Dressing Details (indicate cue type and reason): pt instructed in compression stockings mgt and able to return demo to get stocking on without assist after initial instruction               General ADL Comments: Pt generally requires supervision for ADL/mobility this date for safety, mild impaired balance, and to monitor vitals.     Vision         Perception     Praxis      Pertinent Vitals/Pain Pain Assessment: No/denies pain     Hand Dominance Left   Extremity/Trunk Assessment Upper Extremity Assessment Upper Extremity Assessment: Overall WFL for tasks assessed   Lower Extremity Assessment Lower Extremity Assessment: Overall WFL for tasks assessed       Communication Communication Communication: No difficulties   Cognition Arousal/Alertness: Awake/alert Behavior During Therapy: WFL for tasks assessed/performed Overall Cognitive Status: Within Functional Limits for tasks assessed                                 General Comments: pt ambulates well with no difficulty.     General Comments  Sitting BP 118/64 HR 84, standing 94min BP 123/64 HR 101, standing 60min 128/59 HR 109, marching in place ~61min 114/57 HR 117. Denied dizziness    Exercises Other Exercises Other Exercises: Pt instructed in energy conservation strategies, use of a shower chair for seated showers, strategies to minimize orthostatic hypotension particularly during positional changes and when showering, and falls prevention. Pt verbalized understanding.   Shoulder Instructions      Home Living Family/patient expects to be discharged to:: Private residence Living Arrangements: Alone Available Help at Discharge: Neighbor;Available PRN/intermittently Type of Home: Apartment Home Access: Ramped entrance     Home Layout: One level     Bathroom Shower/Tub: Occupational psychologist: Standard     Home Equipment: Grab  bars - tub/shower;Hand held shower head          Prior Functioning/Environment Prior Level of Function : History of Falls (last six months);Independent/Modified Independent             Mobility Comments: indep ADLs Comments: orders groceries and medications for delivery, [employer] for doctor appointments, was receiving Meals on Wheels (recently signed up for "[employer]" service but hadn't started yet), 1 fall in past 41mo leading to this admission (passed out)        OT Problem List: Decreased activity tolerance;Cardiopulmonary status limiting activity      OT Treatment/Interventions: Self-care/ADL training;Therapeutic exercise;Therapeutic activities;Balance training;Energy conservation;Patient/family education;DME and/or AE instruction    OT Goals(Current goals can be found in the care plan section) Acute Rehab OT Goals Patient Stated Goal: feel better and go home OT Goal Formulation: With patient Time For Goal Achievement: 12/20/20 Potential to Achieve Goals: Good ADL Goals Pt Will Transfer to Toilet: with modified independence;ambulating (elevated commode, LRAD PRN) Additional ADL Goal #1: Pt will perform shower using shower chair as needed, with modified independence, incorporating learned falls prevention/OH prevention strategies. Additional ADL Goal #2: Pt will identify at least 2 learned energy conservation strategies to incorporate into daily ADL routine to maximize safety/indep.  OT Frequency: Min 2X/week   Barriers to D/C:            Co-evaluation  AM-PAC OT "6 Clicks" Daily Activity     Outcome Measure Help from another person eating meals?: None Help from another person taking care of personal grooming?: None Help from another person toileting, which includes using toliet, bedpan, or urinal?: A Little Help from another person bathing (including washing, rinsing, drying)?: A Little Help from another person to put on and taking off regular upper  body clothing?: None Help from another person to put on and taking off regular lower body clothing?: None 6 Click Score: 22   End of Session Nurse Communication: Mobility status;Other (comment) (BP)  Activity Tolerance: Patient tolerated treatment well Patient left: in chair;with call bell/phone within reach;Other (comment) (PT for assessment)  OT Visit Diagnosis: Other abnormalities of gait and mobility (R26.89);History of falling (Z91.81)                Time: 1430-1503 OT Time Calculation (min): 33 min Charges:  OT General Charges $OT Visit: 1 Visit OT Evaluation $OT Eval Low Complexity: 1 Low OT Treatments $Self Care/Home Management : 23-37 mins  Ardeth Perfect., MPH, MS, OTR/L ascom [phone] 12/06/20, 4:37 PM

## 2020-12-06 NOTE — Care Management Obs Status (Signed)
MEDICARE OBSERVATION STATUS NOTIFICATION   Patient Details  Name: Ricky Abbott MRN: [id number] Date of Birth: [birth year]   Medicare Observation Status Notification Given:  Yes    Shelbie Hutching, RN 12/06/2020, 2:32 PM

## 2020-12-06 NOTE — Evaluation (Signed)
Physical Therapy Evaluation Patient Details Name: Ricky Abbott MRN: [id number] DOB: [birth year] Today's Date: 12/06/2020  History of Present Illness  Pt is a 75 y/o male admitted 9/26 secondary to worsening chest pain.Thought to be from unstable angina with 3 vessel CAD. 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 PMH includes COPD, DM, CAD, HTN, and sarcoidosis.    Clinical Impression  Pt received seated in recliner upon arrival to room.  Pt agreeable to therapy.  Pt able to perform mobility out of recliner with CGA initially for safety and progressing to supervision.  Pt required no AD and was able to ambulate around the nursing station x2 with no difficulty.  Pt states he is more at his baseline level of function and states that he does not need any PT services at this time.  Patient is at baseline, all education completed, and time is given to address all questions/concerns. No additional skilled PT services needed at this time, PT signing off. PT recommends daily ambulation ad lib or with nursing staff as needed to prevent deconditioning.         Recommendations for follow up therapy are one component of a multi-disciplinary discharge planning process, led by the attending physician.  Recommendations may be updated based on patient status, additional functional criteria and insurance authorization.  Follow Up Recommendations No PT follow up    Assistance Recommended at Discharge None  Functional Status Assessment Patient has not had a recent decline in their functional status  Equipment Recommendations  None recommended by PT    Recommendations for Other Services       Precautions / Restrictions Precautions Precautions: Fall Restrictions Weight Bearing Restrictions: No      Mobility  Bed Mobility Overal bed mobility: Needs Assistance Bed Mobility: Sidelying to Sit   Sidelying to sit: Supervision            Transfers Overall transfer level: Needs assistance Equipment used:  None Transfers: Sit to/from Stand Sit to Stand: Min guard;Supervision Stand pivot transfers: Min guard;Supervision         General transfer comment: Pt with good balance, ambulating with no AD.    Ambulation/Gait Ambulation/Gait assistance: Independent Gait Distance (Feet): 320 Feet Assistive device: None Gait Pattern/deviations: Step-through pattern Gait velocity: Decreased      Stairs            Wheelchair Mobility    Modified Rankin (Stroke Patients Only)       Balance Overall balance assessment: Independent Sitting-balance support: No upper extremity supported;Feet supported Sitting balance-Leahy Scale: Good     Standing balance support: No upper extremity supported Standing balance-Leahy Scale: Poor                               Pertinent Vitals/Pain Pain Assessment: No/denies pain    Home Living Family/patient expects to be discharged to:: Private residence Living Arrangements: Alone Available Help at Discharge: Neighbor;Available PRN/intermittently Type of Home: Apartment Home Access: Ramped entrance       Home Layout: One level Home Equipment: None      Prior Function                       Hand Dominance   Dominant Hand: Left    Extremity/Trunk Assessment   Upper Extremity Assessment Upper Extremity Assessment: Overall WFL for tasks assessed    Lower Extremity Assessment Lower Extremity Assessment: Overall WFL for tasks assessed  Communication   Communication: No difficulties  Cognition Arousal/Alertness: Awake/alert Behavior During Therapy: WFL for tasks assessed/performed Overall Cognitive Status: Within Functional Limits for tasks assessed                                 General Comments: pt ambulates well with no difficulty.        General Comments      Exercises     Assessment/Plan    PT Assessment All further PT needs can be met in the next venue of care  PT Problem  List         PT Treatment Interventions      PT Goals (Current goals can be found in the Care Plan section)  Acute Rehab PT Goals Patient Stated Goal: to go home. PT Goal Formulation: With patient Time For Goal Achievement: 12/20/20 Potential to Achieve Goals: Good    Frequency     Barriers to discharge        Co-evaluation               AM-PAC PT "6 Clicks" Mobility  Outcome Measure Help needed turning from your back to your side while in a flat bed without using bedrails?: None Help needed moving from lying on your back to sitting on the side of a flat bed without using bedrails?: None Help needed moving to and from a bed to a chair (including a wheelchair)?: None Help needed standing up from a chair using your arms (e.g., wheelchair or bedside chair)?: None Help needed to walk in hospital room?: None Help needed climbing 3-5 steps with a railing? : None 6 Click Score: 24    End of Session Equipment Utilized During Treatment: Gait belt Activity Tolerance: Patient tolerated treatment well Patient left: in chair;with call bell/phone within reach Nurse Communication: Mobility status PT Visit Diagnosis: Unsteadiness on feet (R26.81);Muscle weakness (generalized) (M62.81);Difficulty in walking, not elsewhere classified (R26.2)    Time: 2395-3202 PT Time Calculation (min) (ACUTE ONLY): 12 min   Charges:   PT Evaluation $PT Eval Low Complexity: 1 Low          Gwenlyn Saran, PT, DPT 12/06/20, 3:54 PM   [name] 12/06/2020, 3:51 PM

## 2020-12-06 NOTE — Progress Notes (Addendum)
PROGRESS NOTE    Ricky Abbott  EXN:[id number] DOB: 06-19-1945 DOA: 12/04/2020 PCP: Theotis Burrow, MD    Brief Narrative:  Ricky Abbott is a 75 year old male [employer] veteran with past medical history significant for CAD s/p CABG 11/13/2020, essential hypertension, GERD, insomnia, depression/anxiety, hyperlipidemia, COPD, history of EtOH abuse, history of tubular adenoma of colon who presented to [facility] ED on 10/25 following simple episode.  Patient reports he was up at 5 AM on 10/24 when he got up to urinate, and subsequently passed out.  He was getting ready to walk to the bathroom and he woke up in the hallway.  He crawled back to the bedroom and does not know how long he was out.  He also reports chest pain after eating breakfast on 10/24, sharp in nature lasting 10-15 seconds and resolve spontaneously without any specific intervention.  Patient reports chest pain similar to prior to CABG surgery.  Also endorses dizziness since surgery.  Patient reports currently lives alone.  In the ED, temperature 97.9 F, HR 87, RR 18, BP 113/66, SPO2 95% on room air.  Sodium 136, potassium 5.2, chloride 103, bicarb 26, BUN 22, creatinine 1.03, glucose 130.  WBC 7.2, hemoglobin 12.6, platelets 421.  AST 45, ALT 33.  D-dimer 2.23.  High sensitive troponin 6.  Chest x-ray with no acute cardiopulmonary disease process.  CT head without contrast with chronic changes and no acute intracranial process identified.  CT C-spine without fracture or static subluxation, mild disc space height loss and osteophytosis throughout cervical spine, minimally displaced fracture posterior left first rib.  X-ray T/L-spine with no fracture/dislocation, moderate multilevel disc space disease with osteophytes.  CT a chest negative for pulmonary embolism, trace bilateral pleural effusions with associated atelectasis versus consolidation, CAD and aortic atherosclerosis.  COVID-19 PCR negative.  Influenza A/B PCR negative.   Duration consulted for further evaluation management of syncopal episode.   Assessment & Plan:   Principal Problem:   Syncope Active Problems:   CAD, multiple vessel   Hyperlipidemia   Essential hypertension   Tubular adenoma of colon   S/P coronary artery stent placement   History of hepatitis C virus infection   PTSD (post-traumatic stress disorder)   Dyspnea on exertion   COPD, mild (HCC)   Erectile dysfunction   History of alcohol abuse   Unstable angina (HCC)   Malnutrition of moderate degree   Coronary artery disease   Syncope likely secondary to orthostatic hypotension Patient presenting to ED following syncopal episode associated with dizziness and chest discomfort.  Underwent recent CABG 11/13/2020.  CT head and CTA chest unrevealing.  Recently started on low-dose beta-blocker following CABG and midodrine for orthostasis.  Repeat orthostatic vital signs remain positive. --Cardiology following, appreciate assistance --Beta-blocker discontinued --Holding tamsulosin --Midodrine increased to 5 mg p.o. TID --1L NS bolus today --Continue orthostatic vital signs q12h --Continue monitor on telemetry  Hypomagnesemia Magnesium 1.8, will replete. --Pete electrolytes in a.m.  Rib fracture CT C-spine with incidental finding of left posterior first rib fracture, likely secondary to syncopal episode/fall.  No significant pain. --Lidocaine patch  CAD s/p recent CABG Patient underwent CABG x4 on 11/13/2020, by Dr. Kipp Brood.  EKG with normal sinus rhythm, RBBB and nonspecific T wave changes.  Currently denies chest pain. --Holding beta-blocker as above --Aspirin 81 mg p.o. daily --Plavix 75 mg p.o. daily --Crestor 40 mg p.o. daily  HLD: LDL 67 on 10/30/2020. --Crestor 40 mg p.o. nightly, Zetia 10 mg p.o. daily  Prolonged QTC  QTC 519 MS on EKG. --Avoid QTC prolonging agents --Monitor on telemetry  BPH: Continue to hold tamsulosin in the setting of  orthostasis  Anxiety/depression: Sertraline 100 mg p.o. daily  RLS: Ropinirole 1 mg p.o. nightly  GERD: Protonix 40 mg p.o. daily   DVT prophylaxis: enoxaparin (LOVENOX) injection 40 mg Start: 12/04/20 1745 Place TED hose Start: 12/04/20 1733   Code Status: Full Code Family Communication: No family present at bedside this morning  Disposition Plan:  Level of care: Med-Surg Status is: Observation  The patient remains OBS appropriate and will d/c before 2 midnights.    Consultants:  Cardiology  Procedures:  None  Antimicrobials:  None   Subjective: Patient seen examined bedside, resting comfortably.  Lying in bed.  Patient with continued dizziness on ambulation to the bathroom this morning with nursing staff and he felt like he was going to pass out again.  Recheck of his orthostatic vital signs remain positive.  Cardiology increase his midodrine to 5 mg 3 times daily yesterday, will give 1 L normal saline bolus today.   Holding tamsulosin and beta-blocker.  No other questions or concerns at this time.  Denies headache, no visual changes, no current chest pain, no palpitations, no shortness of breath, no abdominal pain.  No acute events overnight per nurse staff.  Objective: Vitals:   12/06/20 0205 12/06/20 0521 12/06/20 0810 12/06/20 1203  BP:  115/67 123/67 (!) 146/65  Pulse:  95 94 94  Resp:  18 17 15   Temp:  98.2 F (36.8 C) 98 F (36.7 C) 98 F (36.7 C)  TempSrc:   Oral Oral  SpO2:  95% 97% 98%  Weight: 74.2 kg     Height:        Intake/Output Summary (Last 24 hours) at 12/06/2020 1205 Last data filed at 12/06/2020 1026 Gross per 24 hour  Intake 360 ml  Output --  Net 360 ml   Filed Weights   12/04/20 1221 12/06/20 0205  Weight: 73.9 kg 74.2 kg    Examination:  General exam: Appears calm and comfortable  Respiratory system: Clear to auscultation. Respiratory effort normal.  On room air Cardiovascular system: S1 & S2 heard, RRR. No JVD, murmurs,  rubs, gallops or clicks. No pedal edema. Gastrointestinal system: Abdomen is nondistended, soft and nontender. No organomegaly or masses felt. Normal bowel sounds heard. Central nervous system: Alert and oriented. No focal neurological deficits. Extremities: Symmetric 5 x 5 power. Skin: Surgical incision sites from CABG and vein harvesting noted, healing well otherwise no rashes, lesions or ulcers Psychiatry: Judgement and insight appear normal. Mood & affect appropriate.     Data Reviewed: I have personally reviewed following labs and imaging studies  CBC: Recent Labs  Lab 12/04/20 1221  WBC 7.2  HGB 12.6*  HCT 38.8*  MCV 84.3  PLT 948*   Basic Metabolic Panel: Recent Labs  Lab 12/04/20 1221 12/04/20 1931 12/06/20 0451  NA 136  --  139  K 5.2*  --  4.3  CL 103  --  107  CO2 26  --  27  GLUCOSE 130*  --  179*  BUN 22  --  15  CREATININE 1.03  --  0.92  CALCIUM 9.6  --  9.0  MG  --  1.8 1.8   GFR: Estimated Creatinine Clearance: 71.6 mL/min (by C-G formula based on SCr of 0.92 mg/dL). Liver Function Tests: Recent Labs  Lab 12/04/20 1327  AST 45*  ALT 33  ALKPHOS 88  BILITOT 1.3*  PROT 7.1  ALBUMIN 3.7   No results for input(s): LIPASE, AMYLASE in the last 168 hours. No results for input(s): AMMONIA in the last 168 hours. Coagulation Profile: No results for input(s): INR, PROTIME in the last 168 hours. Cardiac Enzymes: No results for input(s): CKTOTAL, CKMB, CKMBINDEX, TROPONINI in the last 168 hours. BNP (last 3 results) No results for input(s): PROBNP in the last 8760 hours. HbA1C: No results for input(s): HGBA1C in the last 72 hours. CBG: Recent Labs  Lab 12/05/20 0854 12/06/20 0520  GLUCAP 120* 175*   Lipid Profile: No results for input(s): CHOL, HDL, LDLCALC, TRIG, CHOLHDL, LDLDIRECT in the last 72 hours. Thyroid Function Tests: Recent Labs    12/04/20 1931  TSH 1.255   Anemia Panel: Recent Labs    12/04/20 1931  [id number]    Sepsis Labs: Recent Labs  Lab 12/04/20 1931  PROCALCITON <0.10    Recent Results (from the past 240 hour(s))  Resp Panel by RT-PCR (Flu A&B, Covid) Nasopharyngeal Swab     Status: None   Collection Time: 12/04/20  6:20 PM   Specimen: Nasopharyngeal Swab; Nasopharyngeal(NP) swabs in vial transport medium  Result Value Ref Range Status   SARS Coronavirus 2 by RT PCR NEGATIVE NEGATIVE Final    Comment: (NOTE) SARS-CoV-2 target nucleic acids are NOT DETECTED.  The SARS-CoV-2 RNA is generally detectable in upper respiratory specimens during the acute phase of infection. The lowest concentration of SARS-CoV-2 viral copies this assay can detect is 138 copies/mL. A negative result does not preclude SARS-Cov-2 infection and should not be used as the sole basis for treatment or other patient management decisions. A negative result may occur with  improper specimen collection/handling, submission of specimen other than nasopharyngeal swab, presence of viral mutation(s) within the areas targeted by this assay, and inadequate number of viral copies(<138 copies/mL). A negative result must be combined with clinical observations, patient history, and epidemiological information. The expected result is Negative.  Fact Sheet for Patients:  EntrepreneurPulse.com.au  Fact Sheet for Healthcare Providers:  IncredibleEmployment.be  This test is no t yet approved or cleared by the [place] FDA and  has been authorized for detection and/or diagnosis of SARS-CoV-2 by FDA under an Emergency Use Authorization (EUA). This EUA will remain  in effect (meaning this test can be used) for the duration of the COVID-19 declaration under Section 564(b)(1) of the Act, 21 U.S.C.section 360bbb-3(b)(1), unless the authorization is terminated  or revoked sooner.       Influenza A by PCR NEGATIVE NEGATIVE Final   Influenza B by PCR NEGATIVE NEGATIVE Final    Comment:  (NOTE) The Xpert Xpress SARS-CoV-2/FLU/RSV plus assay is intended as an aid in the diagnosis of influenza from Nasopharyngeal swab specimens and should not be used as a sole basis for treatment. Nasal washings and aspirates are unacceptable for Xpert Xpress SARS-CoV-2/FLU/RSV testing.  Fact Sheet for Patients: EntrepreneurPulse.com.au  Fact Sheet for Healthcare Providers: IncredibleEmployment.be  This test is not yet approved or cleared by the [place] FDA and has been authorized for detection and/or diagnosis of SARS-CoV-2 by FDA under an Emergency Use Authorization (EUA). This EUA will remain in effect (meaning this test can be used) for the duration of the COVID-19 declaration under Section 564(b)(1) of the Act, 21 U.S.C. section 360bbb-3(b)(1), unless the authorization is terminated or revoked.  Performed at [facility], [address], [place], [place] [address]  Radiology Studies: DG Chest 2 View  Result Date: 12/04/2020 CLINICAL DATA:  Dizziness and syncope EXAM: CHEST - 2 VIEW COMPARISON:  Chest x-ray dated November 18, 2020 FINDINGS: Cardiac and mediastinal contours are within normal limits. Status post median sternotomy and CABG. Lungs are clear. No evidence of effusion or pneumothorax. Displaced fracture. IMPRESSION: No acute cardiopulmonary finding. Electronically Signed   By: Yetta Glassman M.D.   On: 12/04/2020 14:34   DG Thoracic Spine 2 View  Result Date: 12/04/2020 CLINICAL DATA:  Fall EXAM: THORACIC SPINE 2 VIEWS; LUMBAR SPINE - COMPLETE 4+ VIEW COMPARISON:  None. FINDINGS: No fracture or dislocation of the thoracic or lumbar spine. Moderate multilevel disc space height loss and osteophytosis throughout, with bridging osteophytes at many thoracic levels. Mild to moderate facet degenerative change, worst at the lower lumbar levels. Status post median sternotomy and CABG. Nonobstructive pattern of bowel  gas. IMPRESSION: 1.  No fracture or dislocation of the thoracic or lumbar spine. 2. Moderate multilevel disc space height loss and osteophytosis throughout, with bridging osteophytes at many thoracic levels. Mild to moderate facet degenerative change, worst at the lower lumbar levels. 3. Disc and neural foraminal pathology may be further evaluated by MRI if indicated by neurologically localizing signs and symptoms. Electronically Signed   By: Delanna Ahmadi M.D.   On: 12/04/2020 14:35   DG Lumbar Spine Complete  Result Date: 12/04/2020 CLINICAL DATA:  Fall EXAM: THORACIC SPINE 2 VIEWS; LUMBAR SPINE - COMPLETE 4+ VIEW COMPARISON:  None. FINDINGS: No fracture or dislocation of the thoracic or lumbar spine. Moderate multilevel disc space height loss and osteophytosis throughout, with bridging osteophytes at many thoracic levels. Mild to moderate facet degenerative change, worst at the lower lumbar levels. Status post median sternotomy and CABG. Nonobstructive pattern of bowel gas. IMPRESSION: 1.  No fracture or dislocation of the thoracic or lumbar spine. 2. Moderate multilevel disc space height loss and osteophytosis throughout, with bridging osteophytes at many thoracic levels. Mild to moderate facet degenerative change, worst at the lower lumbar levels. 3. Disc and neural foraminal pathology may be further evaluated by MRI if indicated by neurologically localizing signs and symptoms. Electronically Signed   By: Delanna Ahmadi M.D.   On: 12/04/2020 14:35   CT HEAD WO CONTRAST (5MM)  Result Date: 12/04/2020 CLINICAL DATA:  Head trauma EXAM: CT HEAD WITHOUT CONTRAST TECHNIQUE: Contiguous axial images were obtained from the base of the skull through the vertex without intravenous contrast. COMPARISON:  None. FINDINGS: Brain: No evidence of acute infarction, hemorrhage, hydrocephalus, extra-axial collection or mass lesion/mass effect. Mild cortical volume loss. Mild patchy hypodensities in the periventricular and  subcortical white matter, likely secondary to chronic microvascular ischemic changes. Vascular: No hyperdense vessel or unexpected calcification. Skull: Normal. Negative for fracture or focal lesion. Sinuses/Orbits: No acute finding. Other: None. IMPRESSION: Chronic changes with no acute intracranial process identified. Electronically Signed   By: Ofilia Neas   On: 12/04/2020 13:20   CT Angio Chest PE W and/or Wo Contrast  Result Date: 12/04/2020 CLINICAL DATA:  PE suspected, fall EXAM: CT ANGIOGRAPHY CHEST WITH CONTRAST TECHNIQUE: Multidetector CT imaging of the chest was performed using the standard protocol during bolus administration of intravenous contrast. Multiplanar CT image reconstructions and MIPs were obtained to evaluate the vascular anatomy. CONTRAST:  66mL OMNIPAQUE IOHEXOL 350 MG/ML SOLN COMPARISON:  None. FINDINGS: Cardiovascular: Satisfactory opacification of the pulmonary arteries to the segmental level. No evidence of pulmonary embolism. Normal heart size. Three-vessel coronary artery calcifications and  stents status post median sternotomy and CABG. No pericardial effusion. Aortic atherosclerosis. Mediastinum/Nodes: No enlarged mediastinal, hilar, or axillary lymph nodes. Thyroid gland, trachea, and esophagus demonstrate no significant findings. Lungs/Pleura: Trace bilateral pleural effusions and associated atelectasis or consolidation. Upper Abdomen: No acute abnormality. Musculoskeletal: No chest wall abnormality. Status post median sternotomy. Review of the MIP images confirms the above findings. IMPRESSION: 1. Negative examination for pulmonary embolism. 2. Trace bilateral pleural effusions and associated atelectasis or consolidation. 3. Coronary artery disease. Aortic Atherosclerosis (ICD10-I70.0). Electronically Signed   By: Delanna Ahmadi M.D.   On: 12/04/2020 16:51   CT Cervical Spine Wo Contrast  Result Date: 12/04/2020 CLINICAL DATA:  Fall EXAM: CT CERVICAL SPINE WITHOUT  CONTRAST TECHNIQUE: Multidetector CT imaging of the cervical spine was performed without intravenous contrast. Multiplanar CT image reconstructions were also generated. COMPARISON:  None. FINDINGS: Alignment: Degenerative straightening and reversal of the normal cervical lordosis. Skull base and vertebrae: No acute fracture. No primary bone lesion or focal pathologic process. Soft tissues and spinal canal: No prevertebral fluid or swelling. No visible canal hematoma. Disc levels: Mild disc space height loss and osteophytosis throughout the cervical spine, with bridging osteophytes at C2-C3 and C5-C6. Upper chest: Minimally displaced fracture of the posterior left first rib, partially imaged (series 2, image 75). Other: None. IMPRESSION: 1. No fracture or static subluxation of the cervical spine. 2. Mild disc space height loss and osteophytosis throughout the cervical spine, with bridging osteophytes at C2-C3 and C5-C6. 3. Minimally displaced fracture of the posterior left first rib, partially imaged. Electronically Signed   By: Delanna Ahmadi M.D.   On: 12/04/2020 14:38   MR BRAIN WO CONTRAST  Result Date: 12/04/2020 CLINICAL DATA:  Initial evaluation for recurrent syncope. EXAM: MRI HEAD WITHOUT CONTRAST TECHNIQUE: Multiplanar, multiecho pulse sequences of the brain and surrounding structures were obtained without intravenous contrast. COMPARISON:  Prior head CT from earlier the same day. FINDINGS: Brain: Diffuse prominence of the CSF containing spaces compatible with generalized age-related cerebral atrophy. Scattered patchy T2/FLAIR hyperintensity within the periventricular and deep white matter both cerebral hemispheres most consistent with chronic small vessel ischemic disease, mild for age. Few scattered superimposed remote lacunar infarcts present about the right hemispheric cerebral white matter. Single subtle focus of diffusion abnormality seen involving the periventricular/deep white matter of the  right frontal centrum semi ovale (series 5, image 32). This appears to correspond with a chronic lacunar infarct at this location, consistent with T2 shine through. No other diffusion abnormality to suggest acute or subacute ischemia. Gray-white matter differentiation maintained. No encephalomalacia to suggest chronic cortical infarction. No acute intracranial hemorrhage. No mass lesion, midline shift or mass effect. No hydrocephalus or extra-axial fluid collection. Pituitary gland suprasellar region within normal limits. Midline structures intact and normal. Vascular: Major intracranial vascular flow voids are maintained. Skull and upper cervical spine: Craniocervical junction within normal limits. Degenerative disc bulge with resultant mild-to-moderate spinal stenosis partially visualized at C3-4 (series 9, image 11). Bone marrow signal intensity within normal limits. No scalp soft tissue abnormality. Sinuses/Orbits: Globes and orbital soft tissues within normal limits. Mild scattered mucosal thickening noted within the ethmoidal air cells. Paranasal sinuses are otherwise clear. Trace left mastoid effusion, of doubtful significance. Inner ear structures grossly normal. Other: None. IMPRESSION: 1. No acute intracranial abnormality. 2. Age-related cerebral atrophy with mild chronic small vessel ischemic disease, with a few scattered remote lacunar infarcts about the hemispheric cerebral white matter. 3. Degenerative disc bulge with resultant mild-to-moderate spinal stenosis at  C3-4, partially visualized. Finding could be further assessed with dedicated MRI of the cervical spine as clinically warranted. Electronically Signed   By: Jeannine Boga M.D.   On: 12/04/2020 21:58        Scheduled Meds:  aspirin EC  81 mg Oral Daily   clopidogrel  75 mg Oral Daily   enoxaparin (LOVENOX) injection  40 mg Subcutaneous Q24H   ezetimibe  10 mg Oral Daily   lidocaine  1 patch Transdermal Q24H   midodrine  5 mg  Oral TID WC   pantoprazole  40 mg Oral Daily   rosuvastatin  40 mg Oral QHS   sertraline  100 mg Oral Daily   Continuous Infusions:   LOS: 0 days    Time spent: 39 minutes spent on chart review, discussion with nursing staff, consultants, updating family and interview/physical exam; more than 50% of that time was spent in counseling and/or coordination of care.    Siera Beyersdorf J [place] DO [facility] Available via Epic secure chat 7am-7pm After these hours, please refer to coverage provider listed on [email or web address] 12/06/2020, 12:05 PM

## 2020-12-06 NOTE — ED Notes (Signed)
ED TO INPATIENT HANDOFF REPORT  ED Nurse Name and Phone #: [phone]  S Name/Age/Gender Ricky Abbott 75 y.o. male Room/Bed: ED32A/ED32A  Code Status   Code Status: Full Code  Home/SNF/Other Home Patient oriented to: self, place, time, and situation Is this baseline? Yes   Triage Complete: Triage complete  Chief Complaint Syncope [R55]  Triage Note Pt here with a fall early yesterday morning. Pt had heart surgery on the 2nd of this month. Pt states that he did hit his head when he fell and he had a LOC. Pt states that he slid back to his bedroom and laid there for a while. Pt having dizziness and lightheadedness. Pt in NAD in triage.   Allergies Allergies  Allergen Reactions   Peanut-Containing Drug Products Swelling    Swelling, rash, (Peanuts)    Level of Care/Admitting Diagnosis ED Disposition     ED Disposition  Admit   Condition  --   Comment  Hospital Area: [place] [[id number]]  Level of Care: Med-Surg [16]  Covid Evaluation: Asymptomatic Screening Protocol (No Symptoms)  Diagnosis: Syncope [206001]  Admitting Physician: Criss Alvine [[id number]]  Attending Physician: COX, AMY N [id number]          B Medical/Surgery History Past Medical History:  Diagnosis Date   Allergy    Anxiety    Asthma    CAD, multiple vessel    COPD (chronic obstructive pulmonary disease) ([place])    Depression    Diabetes mellitus without complication (HCC)    Dyspnea    GERD (gastroesophageal reflux disease)    History of chicken pox    Hyperlipidemia    Hypertension    MI (myocardial infarction) ([place])    Prostate enlargement    PTSD (post-traumatic stress disorder)    [place] Vet   Sarcoidosis of lung ([place])    Past Surgical History:  Procedure Laterality Date   billary tube placement     [place], [place] N/A 06/05/2016   Procedure: LAPAROSCOPIC  CHOLECYSTECTOMY with cholangiogram;  Surgeon: Jules Husbands, MD;  Location: [facility] OR[facility];  Service: General;  Laterality: N/A;   CORONARY ARTERY BYPASS GRAFT N/A 11/13/2020   Procedure: CORONARY ARTERY BYPASS GRAFTING (CABG) x 4 ON CARDIOPULMONARY BYPASS USING LIMA AND RIGHT GSV. -LIMA to LAD -SVG to OM -SVG to PDA -SVG to DIAGONAL;  Surgeon: Lajuana Matte, MD;  Location: [facility];  Service: Open Heart Surgery;  Laterality: N/A;   [place], 05/2012   x 2   ENDOVEIN HARVEST OF GREATER SAPHENOUS VEIN Right 11/13/2020   Procedure: ENDOVEIN HARVEST OF GREATER SAPHENOUS VEIN;  Surgeon: Lajuana Matte, MD;  Location: [facility];  Service: Open Heart Surgery;  Laterality: Right;   IR GENERIC HISTORICAL  04/30/2016   IR PERC CHOLECYSTOSTOMY 04/30/2016 [facility]INTER[facility] RAD   LEFT HEART CATH AND CORONARY ANGIOGRAPHY Left 10/06/2019   Procedure: LEFT HEART CATH AND CORONARY ANGIOGRAPHY;  Surgeon: Minna Merritts, MD;  Location: [facility];  Service: Cardiovascular;  Laterality: Left;   LEFT HEART CATH AND CORONARY ANGIOGRAPHY N/A 11/06/2020   Procedure: LEFT HEART CATH AND CORONARY ANGIOGRAPHY;  Surgeon: Nelva Bush, MD;  Location: [facility];  Service: Cardiovascular;  Laterality: N/A;   TEE WITHOUT CARDIOVERSION N/A 11/13/2020   Procedure: TRANSESOPHAGEAL ECHOCARDIOGRAM (TEE);  Surgeon: Lajuana Matte, MD;  Location: [facility]  OR;  Service: Open Heart Surgery;  Laterality: N/A;     A IV Location/Drains/Wounds Patient Lines/Drains/Airways Status     Active Line/Drains/Airways     Name Placement date Placement time Site Days   Peripheral IV 12/04/20 22 G Right Hand 12/04/20  1346  Hand  2   Peripheral IV 12/04/20 20 G Right Antecubital 12/04/20  1552  Antecubital  2            Intake/Output Last 24 hours No intake or output data in the 24 hours ending 12/06/20 0047  Labs/Imaging Results for orders placed or performed during the hospital encounter of  12/04/20 (from the past 48 hour(s))  Basic metabolic panel     Status: Abnormal   Collection Time: 12/04/20 12:21 PM  Result Value Ref Range   Sodium 136 135 - 145 mmol/L   Potassium 5.2 (H) 3.5 - 5.1 mmol/L   Chloride 103 98 - 111 mmol/L   CO2 26 22 - 32 mmol/L   Glucose, Bld 130 (H) 70 - 99 mg/dL    Comment: Glucose reference range applies only to samples taken after fasting for at least 8 hours.   BUN 22 8 - 23 mg/dL   Creatinine, Ser 1.03 0.61 - 1.24 mg/dL   Calcium 9.6 8.9 - 10.3 mg/dL   GFR, Estimated >60 >60 mL/min    Comment: (NOTE) Calculated using the CKD-EPI Creatinine Equation (2021)    Anion gap 7 5 - 15    Comment: Performed at [facility], [address] [place], [place] [address]  CBC     Status: Abnormal   Collection Time: 12/04/20 12:21 PM  Result Value Ref Range   WBC 7.2 4.0 - 10.5 K/uL   RBC 4.60 4.22 - 5.81 MIL/uL   Hemoglobin 12.6 (L) 13.0 - 17.0 g/dL   HCT 38.8 (L) 39.0 - 52.0 %   MCV 84.3 80.0 - 100.0 fL   MCH 27.4 26.0 - 34.0 pg   MCHC 32.5 30.0 - 36.0 g/dL   RDW 14.9 11.5 - 15.5 %   Platelets 421 (H) 150 - 400 K/uL   nRBC 0.0 0.0 - 0.2 %    Comment: Performed at [facility], [address] [place], [place] [address]  Ethanol     Status: None   Collection Time: 12/04/20 12:21 PM  Result Value Ref Range   Alcohol, Ethyl (B) <10 <10 mg/dL    Comment: (NOTE) Lowest detectable limit for serum alcohol is 10 mg/dL.  For medical purposes only. Performed at [facility], [place], [place] [address]   Troponin I (High Sensitivity)     Status: None   Collection Time: 12/04/20  1:27 PM  Result Value Ref Range   Troponin I (High Sensitivity) 6 <18 ng/L    Comment: (NOTE) Elevated high sensitivity troponin I (hsTnI) values and significant  changes across serial measurements may suggest ACS but many other  chronic and acute conditions are known to elevate hsTnI results.  Refer to the "Links" section for  chest pain algorithms and additional  guidance. Performed at [facility], [address] [place], [place] [address]   D-dimer, quantitative     Status: Abnormal   Collection Time: 12/04/20  1:27 PM  Result Value Ref Range   D-Dimer, Quant 3.23 (H) 0.00 - 0.50 ug/mL-FEU    Comment: (NOTE) At the manufacturer cut-off value of 0.5 g/mL FEU, this assay has a negative predictive value of 95-100%.This assay  is intended for use in conjunction with a clinical pretest probability (PTP) assessment model to exclude pulmonary embolism (PE) and deep venous thrombosis (DVT) in outpatients suspected of PE or DVT. Results should be correlated with clinical presentation. Performed at [facility], [address] [place], [place] [address]   Hepatic function panel     Status: Abnormal   Collection Time: 12/04/20  1:27 PM  Result Value Ref Range   Total Protein 7.1 6.5 - 8.1 g/dL   Albumin 3.7 3.5 - 5.0 g/dL   AST 45 (H) 15 - 41 U/L    Comment: HEMOLYSIS AT THIS LEVEL MAY AFFECT RESULT   ALT 33 0 - 44 U/L    Comment: HEMOLYSIS AT THIS LEVEL MAY AFFECT RESULT   Alkaline Phosphatase 88 38 - 126 U/L   Total Bilirubin 1.3 (H) 0.3 - 1.2 mg/dL    Comment: HEMOLYSIS AT THIS LEVEL MAY AFFECT RESULT   Bilirubin, Direct 0.4 (H) 0.0 - 0.2 mg/dL    Comment: HEMOLYSIS AT THIS LEVEL MAY AFFECT RESULT   Indirect Bilirubin 0.9 0.3 - 0.9 mg/dL    Comment: Performed at [name], [address] [place], [place] [address]  Resp Panel by RT-PCR (Flu A&B, Covid) Nasopharyngeal Swab     Status: None   Collection Time: 12/04/20  6:20 PM   Specimen: Nasopharyngeal Swab; Nasopharyngeal(NP) swabs in vial transport medium  Result Value Ref Range   SARS Coronavirus 2 by RT PCR NEGATIVE NEGATIVE    Comment: (NOTE) SARS-CoV-2 target nucleic acids are NOT DETECTED.  The SARS-CoV-2 RNA is generally detectable in upper respiratory specimens during the acute phase of infection. The  lowest concentration of SARS-CoV-2 viral copies this assay can detect is 138 copies/mL. A negative result does not preclude SARS-Cov-2 infection and should not be used as the sole basis for treatment or other patient management decisions. A negative result may occur with  improper specimen collection/handling, submission of specimen other than nasopharyngeal swab, presence of viral mutation(s) within the areas targeted by this assay, and inadequate number of viral copies(<138 copies/mL). A negative result must be combined with clinical observations, patient history, and epidemiological information. The expected result is Negative.  Fact Sheet for Patients:  EntrepreneurPulse.com.au  Fact Sheet for Healthcare Providers:  IncredibleEmployment.be  This test is no t yet approved or cleared by the [place] FDA and  has been authorized for detection and/or diagnosis of SARS-CoV-2 by FDA under an Emergency Use Authorization (EUA). This EUA will remain  in effect (meaning this test can be used) for the duration of the COVID-19 declaration under Section 564(b)(1) of the Act, 21 U.S.C.section 360bbb-3(b)(1), unless the authorization is terminated  or revoked sooner.       Influenza A by PCR NEGATIVE NEGATIVE   Influenza B by PCR NEGATIVE NEGATIVE    Comment: (NOTE) The Xpert Xpress SARS-CoV-2/FLU/RSV plus assay is intended as an aid in the diagnosis of influenza from Nasopharyngeal swab specimens and should not be used as a sole basis for treatment. Nasal washings and aspirates are unacceptable for Xpert Xpress SARS-CoV-2/FLU/RSV testing.  Fact Sheet for Patients: EntrepreneurPulse.com.au  Fact Sheet for Healthcare Providers: IncredibleEmployment.be  This test is not yet approved or cleared by the [place] FDA and has been authorized for detection and/or diagnosis of SARS-CoV-2 by FDA under an Emergency  Use Authorization (EUA). This EUA will remain in effect (meaning this test can be used) for the duration of the COVID-19 declaration under Section 564(b)(1)  of the Act, 21 U.S.C. section 360bbb-3(b)(1), unless the authorization is terminated or revoked.  Performed at [facility], [address] [place], [place] [address]   TSH     Status: None   Collection Time: 12/04/20  7:31 PM  Result Value Ref Range   TSH 1.255 0.350 - 4.500 uIU/mL    Comment: Performed by a 3rd Generation assay with a functional sensitivity of <=0.01 uIU/mL. Performed at [facility], [address] [place], [place] [address]   Brain natriuretic peptide     Status: Abnormal   Collection Time: 12/04/20  7:31 PM  Result Value Ref Range   B Natriuretic Peptide 163.5 (H) 0.0 - 100.0 pg/mL    Comment: Performed at [facility], [address] [place], [place] [address]  Vitamin B12     Status: None   Collection Time: 12/04/20  7:31 PM  Result Value Ref Range   Vitamin B-12 775 180 - 914 pg/mL    Comment: (NOTE) This assay is not validated for testing neonatal or myeloproliferative syndrome specimens for Vitamin B12 levels. Performed at [facility], [place] [address], [place], [place] [address]   VITAMIN D 25 Hydroxy (Vit-D Deficiency, Fractures)     Status: Abnormal   Collection Time: 12/04/20  7:31 PM  Result Value Ref Range   Vit D, 25-Hydroxy 27.42 (L) 30 - 100 ng/mL    Comment: (NOTE) Vitamin D deficiency has been defined by the Institute of Medicine  and an Endocrine Society practice guideline as a level of serum 25-OH  vitamin D less than 20 ng/mL (1,2). The Endocrine Society went on to  further define vitamin D insufficiency as a level between 21 and 29  ng/mL (2).  1. IOM (Institute of Medicine). 2010. Dietary reference intakes for  calcium and D. [place]: The [employer]. 2. Holick MF, [place], Bischoff-Ferrari HA, et al. Evaluation,   treatment, and prevention of vitamin D deficiency: an Endocrine  Society clinical practice guideline, JCEM. 2011 Jul; 96(7): 1911-30.  Performed at [facility], [place] [address] [place], [place] [address]   Procalcitonin - Baseline     Status: None   Collection Time: 12/04/20  7:31 PM  Result Value Ref Range   Procalcitonin <0.10 ng/mL    Comment:        Interpretation: PCT (Procalcitonin) <= 0.5 ng/mL: Systemic infection (sepsis) is not likely. Local bacterial infection is possible. (NOTE)       Sepsis PCT Algorithm           Lower Respiratory Tract                                      Infection PCT Algorithm    ----------------------------     ----------------------------         PCT < 0.25 ng/mL                PCT < 0.10 ng/mL          Strongly encourage             Strongly discourage   discontinuation of antibiotics    initiation of antibiotics    ----------------------------     -----------------------------       PCT 0.25 - 0.50 ng/mL            PCT 0.10 - 0.25 ng/mL  OR       >80% decrease in PCT            Discourage initiation of                                            antibiotics      Encourage discontinuation           of antibiotics    ----------------------------     -----------------------------         PCT >= 0.50 ng/mL              PCT 0.26 - 0.50 ng/mL               AND        <80% decrease in PCT             Encourage initiation of                                             antibiotics       Encourage continuation           of antibiotics    ----------------------------     -----------------------------        PCT >= 0.50 ng/mL                  PCT > 0.50 ng/mL               AND         increase in PCT                  Strongly encourage                                      initiation of antibiotics    Strongly encourage escalation           of antibiotics                                     -----------------------------                                            PCT <= 0.25 ng/mL                                                 OR                                        > 80% decrease in PCT                                      Discontinue / Do not initiate  antibiotics  Performed at [facility], [address] [place], [place] [address]   Magnesium     Status: None   Collection Time: 12/04/20  7:31 PM  Result Value Ref Range   Magnesium 1.8 1.7 - 2.4 mg/dL    Comment: Performed at [facility], [address] [place], [place] [address]  Urinalysis, Routine w reflex microscopic Urine, Clean Catch     Status: Abnormal   Collection Time: 12/04/20 10:21 PM  Result Value Ref Range   Color, Urine YELLOW (A) YELLOW   APPearance CLEAR (A) CLEAR   Specific Gravity, Urine 1.040 (H) 1.005 - 1.030   pH 5.0 5.0 - 8.0   Glucose, UA NEGATIVE NEGATIVE mg/dL   Hgb urine dipstick NEGATIVE NEGATIVE   Bilirubin Urine NEGATIVE NEGATIVE   Ketones, ur NEGATIVE NEGATIVE mg/dL   Protein, ur NEGATIVE NEGATIVE mg/dL   Nitrite NEGATIVE NEGATIVE   Leukocytes,Ua NEGATIVE NEGATIVE    Comment: Performed at [facility], [address] [place], [place] [address]  Urine Drug Screen, Qualitative ([facility] only)     Status: None   Collection Time: 12/04/20 10:21 PM  Result Value Ref Range   Tricyclic, Ur Screen NONE DETECTED NONE DETECTED   Amphetamines, Ur Screen NONE DETECTED NONE DETECTED   MDMA (Ecstasy)Ur Screen NONE DETECTED NONE DETECTED   Cocaine Metabolite,Ur [place] NONE DETECTED NONE DETECTED   Opiate, Ur Screen NONE DETECTED NONE DETECTED   Phencyclidine (PCP) Ur S NONE DETECTED NONE DETECTED   Cannabinoid 50 Ng, Ur [place] NONE DETECTED NONE DETECTED   Barbiturates, Ur Screen NONE DETECTED NONE DETECTED   Benzodiazepine, Ur Scrn NONE DETECTED NONE DETECTED   Methadone Scn, Ur NONE DETECTED NONE DETECTED    Comment: (NOTE) Tricyclics + metabolites, urine     Cutoff 1000 ng/mL Amphetamines + metabolites, urine  Cutoff 1000 ng/mL MDMA (Ecstasy), urine              Cutoff 500 ng/mL Cocaine Metabolite, urine          Cutoff 300 ng/mL Opiate + metabolites, urine        Cutoff 300 ng/mL Phencyclidine (PCP), urine         Cutoff 25 ng/mL Cannabinoid, urine                 Cutoff 50 ng/mL Barbiturates + metabolites, urine  Cutoff 200 ng/mL Benzodiazepine, urine              Cutoff 200 ng/mL Methadone, urine                   Cutoff 300 ng/mL  The urine drug screen provides only a preliminary, unconfirmed analytical test result and should not be used for non-medical purposes. Clinical consideration and professional judgment should be applied to any positive drug screen result due to possible interfering substances. A more specific alternate chemical method must be used in order to obtain a confirmed analytical result. Gas chromatography / mass spectrometry (GC/MS) is the preferred confirm atory method. Performed at [facility], [address] [place],  [address]   CBG monitoring, ED     Status: Abnormal   Collection Time: 12/05/20  8:54 AM  Result Value Ref Range   Glucose-Capillary 120 (H) 70 - 99 mg/dL    Comment: Glucose reference range applies only to samples taken after fasting for at least 8 hours.   DG Chest 2 View  Result Date: 12/04/2020 CLINICAL DATA:  Dizziness and syncope  EXAM: CHEST - 2 VIEW COMPARISON:  Chest x-ray dated November 18, 2020 FINDINGS: Cardiac and mediastinal contours are within normal limits. Status post median sternotomy and CABG. Lungs are clear. No evidence of effusion or pneumothorax. Displaced fracture. IMPRESSION: No acute cardiopulmonary finding. Electronically Signed   By: Yetta Glassman M.D.   On: 12/04/2020 14:34   DG Thoracic Spine 2 View  Result Date: 12/04/2020 CLINICAL DATA:  Fall EXAM: THORACIC SPINE 2 VIEWS; LUMBAR SPINE - COMPLETE 4+ VIEW COMPARISON:  None. FINDINGS: No fracture  or dislocation of the thoracic or lumbar spine. Moderate multilevel disc space height loss and osteophytosis throughout, with bridging osteophytes at many thoracic levels. Mild to moderate facet degenerative change, worst at the lower lumbar levels. Status post median sternotomy and CABG. Nonobstructive pattern of bowel gas. IMPRESSION: 1.  No fracture or dislocation of the thoracic or lumbar spine. 2. Moderate multilevel disc space height loss and osteophytosis throughout, with bridging osteophytes at many thoracic levels. Mild to moderate facet degenerative change, worst at the lower lumbar levels. 3. Disc and neural foraminal pathology may be further evaluated by MRI if indicated by neurologically localizing signs and symptoms. Electronically Signed   By: Delanna Ahmadi M.D.   On: 12/04/2020 14:35   DG Lumbar Spine Complete  Result Date: 12/04/2020 CLINICAL DATA:  Fall EXAM: THORACIC SPINE 2 VIEWS; LUMBAR SPINE - COMPLETE 4+ VIEW COMPARISON:  None. FINDINGS: No fracture or dislocation of the thoracic or lumbar spine. Moderate multilevel disc space height loss and osteophytosis throughout, with bridging osteophytes at many thoracic levels. Mild to moderate facet degenerative change, worst at the lower lumbar levels. Status post median sternotomy and CABG. Nonobstructive pattern of bowel gas. IMPRESSION: 1.  No fracture or dislocation of the thoracic or lumbar spine. 2. Moderate multilevel disc space height loss and osteophytosis throughout, with bridging osteophytes at many thoracic levels. Mild to moderate facet degenerative change, worst at the lower lumbar levels. 3. Disc and neural foraminal pathology may be further evaluated by MRI if indicated by neurologically localizing signs and symptoms. Electronically Signed   By: Delanna Ahmadi M.D.   On: 12/04/2020 14:35   CT HEAD WO CONTRAST (5MM)  Result Date: 12/04/2020 CLINICAL DATA:  Head trauma EXAM: CT HEAD WITHOUT CONTRAST TECHNIQUE: Contiguous axial  images were obtained from the base of the skull through the vertex without intravenous contrast. COMPARISON:  None. FINDINGS: Brain: No evidence of acute infarction, hemorrhage, hydrocephalus, extra-axial collection or mass lesion/mass effect. Mild cortical volume loss. Mild patchy hypodensities in the periventricular and subcortical white matter, likely secondary to chronic microvascular ischemic changes. Vascular: No hyperdense vessel or unexpected calcification. Skull: Normal. Negative for fracture or focal lesion. Sinuses/Orbits: No acute finding. Other: None. IMPRESSION: Chronic changes with no acute intracranial process identified. Electronically Signed   By: Ofilia Neas   On: 12/04/2020 13:20   CT Angio Chest PE W and/or Wo Contrast  Result Date: 12/04/2020 CLINICAL DATA:  PE suspected, fall EXAM: CT ANGIOGRAPHY CHEST WITH CONTRAST TECHNIQUE: Multidetector CT imaging of the chest was performed using the standard protocol during bolus administration of intravenous contrast. Multiplanar CT image reconstructions and MIPs were obtained to evaluate the vascular anatomy. CONTRAST:  76mL OMNIPAQUE IOHEXOL 350 MG/ML SOLN COMPARISON:  None. FINDINGS: Cardiovascular: Satisfactory opacification of the pulmonary arteries to the segmental level. No evidence of pulmonary embolism. Normal heart size. Three-vessel coronary artery calcifications and stents status post median sternotomy and CABG. No pericardial effusion. Aortic atherosclerosis. Mediastinum/Nodes: No enlarged mediastinal,  hilar, or axillary lymph nodes. Thyroid gland, trachea, and esophagus demonstrate no significant findings. Lungs/Pleura: Trace bilateral pleural effusions and associated atelectasis or consolidation. Upper Abdomen: No acute abnormality. Musculoskeletal: No chest wall abnormality. Status post median sternotomy. Review of the MIP images confirms the above findings. IMPRESSION: 1. Negative examination for pulmonary embolism. 2. Trace  bilateral pleural effusions and associated atelectasis or consolidation. 3. Coronary artery disease. Aortic Atherosclerosis (ICD10-I70.0). Electronically Signed   By: Delanna Ahmadi M.D.   On: 12/04/2020 16:51   CT Cervical Spine Wo Contrast  Result Date: 12/04/2020 CLINICAL DATA:  Fall EXAM: CT CERVICAL SPINE WITHOUT CONTRAST TECHNIQUE: Multidetector CT imaging of the cervical spine was performed without intravenous contrast. Multiplanar CT image reconstructions were also generated. COMPARISON:  None. FINDINGS: Alignment: Degenerative straightening and reversal of the normal cervical lordosis. Skull base and vertebrae: No acute fracture. No primary bone lesion or focal pathologic process. Soft tissues and spinal canal: No prevertebral fluid or swelling. No visible canal hematoma. Disc levels: Mild disc space height loss and osteophytosis throughout the cervical spine, with bridging osteophytes at C2-C3 and C5-C6. Upper chest: Minimally displaced fracture of the posterior left first rib, partially imaged (series 2, image 75). Other: None. IMPRESSION: 1. No fracture or static subluxation of the cervical spine. 2. Mild disc space height loss and osteophytosis throughout the cervical spine, with bridging osteophytes at C2-C3 and C5-C6. 3. Minimally displaced fracture of the posterior left first rib, partially imaged. Electronically Signed   By: Delanna Ahmadi M.D.   On: 12/04/2020 14:38   MR BRAIN WO CONTRAST  Result Date: 12/04/2020 CLINICAL DATA:  Initial evaluation for recurrent syncope. EXAM: MRI HEAD WITHOUT CONTRAST TECHNIQUE: Multiplanar, multiecho pulse sequences of the brain and surrounding structures were obtained without intravenous contrast. COMPARISON:  Prior head CT from earlier the same day. FINDINGS: Brain: Diffuse prominence of the CSF containing spaces compatible with generalized age-related cerebral atrophy. Scattered patchy T2/FLAIR hyperintensity within the periventricular and deep white  matter both cerebral hemispheres most consistent with chronic small vessel ischemic disease, mild for age. Few scattered superimposed remote lacunar infarcts present about the right hemispheric cerebral white matter. Single subtle focus of diffusion abnormality seen involving the periventricular/deep white matter of the right frontal centrum semi ovale (series 5, image 32). This appears to correspond with a chronic lacunar infarct at this location, consistent with T2 shine through. No other diffusion abnormality to suggest acute or subacute ischemia. Gray-white matter differentiation maintained. No encephalomalacia to suggest chronic cortical infarction. No acute intracranial hemorrhage. No mass lesion, midline shift or mass effect. No hydrocephalus or extra-axial fluid collection. Pituitary gland suprasellar region within normal limits. Midline structures intact and normal. Vascular: Major intracranial vascular flow voids are maintained. Skull and upper cervical spine: Craniocervical junction within normal limits. Degenerative disc bulge with resultant mild-to-moderate spinal stenosis partially visualized at C3-4 (series 9, image 11). Bone marrow signal intensity within normal limits. No scalp soft tissue abnormality. Sinuses/Orbits: Globes and orbital soft tissues within normal limits. Mild scattered mucosal thickening noted within the ethmoidal air cells. Paranasal sinuses are otherwise clear. Trace left mastoid effusion, of doubtful significance. Inner ear structures grossly normal. Other: None. IMPRESSION: 1. No acute intracranial abnormality. 2. Age-related cerebral atrophy with mild chronic small vessel ischemic disease, with a few scattered remote lacunar infarcts about the hemispheric cerebral white matter. 3. Degenerative disc bulge with resultant mild-to-moderate spinal stenosis at C3-4, partially visualized. Finding could be further assessed with dedicated MRI of the cervical spine as  clinically  warranted. Electronically Signed   By: Jeannine Boga M.D.   On: 12/04/2020 21:58    Pending Labs Unresulted Labs (From admission, onward)     Start     Ordered   12/06/20 8333  Basic metabolic panel  Tomorrow morning,   STAT        12/05/20 1850   12/06/20 0500  Magnesium  Tomorrow morning,   STAT        12/05/20 1850   12/04/20 1740  Vitamin B1  Once,   STAT        12/04/20 1739            Vitals/Pain Today's Vitals   12/05/20 2000 12/05/20 2130 12/05/20 2230 12/05/20 2300  BP: 105/66 90/77 (!) 94/44 112/65  Pulse: 87 86 85 89  Resp: 18 16 18 18   Temp:      TempSrc:      SpO2: 91% 96% 93% 91%  Weight:      Height:      PainSc:        Isolation Precautions No active isolations  Medications Medications  lidocaine (LIDODERM) 5 % 1 patch (1 patch Transdermal Patch Applied 12/05/20 1352)  aspirin EC tablet 81 mg (81 mg Oral Given 12/05/20 0916)  ezetimibe (ZETIA) tablet 10 mg (10 mg Oral Given 12/05/20 0916)  nitroGLYCERIN (NITROSTAT) SL tablet 0.4 mg (has no administration in time range)  rosuvastatin (CRESTOR) tablet 40 mg (40 mg Oral Given 12/05/20 2128)  sertraline (ZOLOFT) tablet 100 mg (100 mg Oral Given 12/05/20 0916)  zolpidem (AMBIEN) tablet 5 mg (5 mg Oral Given 12/05/20 2129)  pantoprazole (PROTONIX) EC tablet 40 mg (40 mg Oral Given 12/05/20 0916)  clopidogrel (PLAVIX) tablet 75 mg (75 mg Oral Given 12/05/20 0916)  albuterol (PROVENTIL) (2.5 MG/3ML) 0.083% nebulizer solution 2.5 mg (has no administration in time range)  enoxaparin (LOVENOX) injection 40 mg (40 mg Subcutaneous Given 12/05/20 1613)  acetaminophen (TYLENOL) tablet 650 mg (has no administration in time range)    Or  acetaminophen (TYLENOL) suppository 650 mg (has no administration in time range)  ondansetron (ZOFRAN) tablet 4 mg (has no administration in time range)    Or  ondansetron (ZOFRAN) injection 4 mg (has no administration in time range)  influenza vaccine adjuvanted (FLUAD)  injection 0.5 mL (has no administration in time range)  midodrine (PROAMATINE) tablet 5 mg (has no administration in time range)  acetaminophen (TYLENOL) tablet 1,000 mg (1,000 mg Oral Given 12/04/20 1422)  iohexol (OMNIPAQUE) 350 MG/ML injection 75 mL (75 mLs Intravenous Contrast Given 12/04/20 1624)  rOPINIRole (REQUIP) tablet 1 mg (1 mg Oral Given 12/05/20 2128)    Mobility walks High fall risk   Focused Assessments     R Recommendations: See Admitting Provider Note  Report given to:   Additional Notes:

## 2020-12-06 NOTE — TOC Initial Note (Signed)
Transition of Care [facility]) - Initial/Assessment Note    Patient Details  Name: Ricky Abbott MRN: [id number] Date of Birth: Apr 08, 1945  Transition of [facility]) CM/SW Contact:    Shelbie Hutching, RN Phone Number: 12/06/2020, 4:21 PM  Clinical Narrative:                 Patient placed under observation for syncopal episode.  Patient is still undergoing testing, may be able to discharge home tomorrow.  Patient is open with [facility] for [facility] services.  He lives alone and will need transportation home.  TOC or nursing can get [facility] to take patient home.     Expected Discharge Plan: [place] Barriers to Discharge: Continued Medical Work up   Patient Goals and CMS Choice Patient states their goals for this hospitalization and ongoing recovery are:: to get home CMS Medicare.gov Compare Post Acute Care list provided to:: Patient Choice offered to / list presented to : Patient  Expected Discharge Plan and Services Expected Discharge Plan: [place]   Discharge Planning Services: CM Consult Post Acute Care Choice: [place] arrangements for the past 2 months: Apartment                 DME Arranged: N/A DME Agency: NA       HH Arranged: PT HH Agency: [place] Date [place]: 12/06/20 Time [place]: 1620 Representative spoke with at [place]: Tommi Rumps  Prior Living Arrangements/Services Living arrangements for the past 2 months: Apartment Lives with:: Self Patient language and need for interpreter reviewed:: Yes Do you feel safe going back to the place where you live?: Yes      Need for Family Participation in Patient Care: No (Comment) Care giver support system in place?: No (comment)   Criminal Activity/Legal Involvement Pertinent to Current Situation/Hospitalization: No - Comment as needed  Activities of Daily Living Home Assistive Devices/Equipment: Walker (specify type) ADL Screening  (condition at time of admission) Patient's cognitive ability adequate to safely complete daily activities?: Yes Is the patient deaf or have difficulty hearing?: No Does the patient have difficulty seeing, even when wearing glasses/contacts?: No Does the patient have difficulty concentrating, remembering, or making decisions?: No Patient able to express need for assistance with ADLs?: Yes Does the patient have difficulty dressing or bathing?: No Independently performs ADLs?: No Communication: Independent Dressing (OT): Independent Grooming: Independent Feeding: Independent Bathing: Independent Toileting: Needs assistance Is this a change from baseline?: Change from baseline, expected to last <3 days In/Out Bed: Needs assistance Is this a change from baseline?: Change from baseline, expected to last <3 days Walks in Home: Independent with device (comment) Does the patient have difficulty walking or climbing stairs?: Yes Weakness of Legs: Both (orthostatic) Weakness of Arms/Hands: None  Permission Sought/Granted Permission sought to share information with : Other (comment) Permission granted to share information with : Yes, Verbal Permission Granted     Permission granted to share info w AGENCY: [facility]        Emotional Assessment Appearance:: Appears stated age Attitude/Demeanor/Rapport: Engaged Affect (typically observed): Accepting Orientation: : Oriented to Self, Oriented to Place, Oriented to  Time, Oriented to Situation Alcohol / Substance Use: Not Applicable Psych Involvement: No (comment)  Admission diagnosis:  Syncope and collapse [R55] Syncope [R55] S/P CABG (coronary artery bypass graft) [Z95.1] Fall, initial encounter [W19.XXXA] Closed fracture of one rib of left side, initial encounter [S22.32XA] Nonintractable headache, unspecified  chronicity pattern, unspecified headache type [R51.9] Midline back pain, unspecified back location, unspecified chronicity  [M54.89] Patient Active Problem List   Diagnosis Date Noted   Syncope 12/04/2020   Coronary artery disease 11/13/2020   Malnutrition of moderate degree 11/09/2020   Chest pain 11/06/2020   Unstable angina ([place]) 10/06/2019   Cholecystitis    Urinary hesitancy due to benign prostatic hyperplasia 05/17/2016   Cervical spondylosis with radiculopathy 05/17/2016   VBI (vertebrobasilar insufficiency) 05/17/2016   Pre-operative cardiovascular examination 04/28/2016   Medication management 04/28/2016   History of alcohol abuse 04/15/2016   Poor diet 04/15/2016   Chronic post-traumatic stress disorder (PTSD) 05/23/2015   DM type 2 with diabetic peripheral neuropathy ([place]) 05/21/2015   Erectile dysfunction 02/19/2015   COPD, mild ([place]) 03/08/2014   Dyspnea on exertion 03/02/2014   PTSD (post-traumatic stress disorder) 12/16/2013   Sarcoidosis 12/15/2013   History of hepatitis C virus infection 12/15/2013   S/P coronary artery stent placement 09/04/2013   Other chest pain 09/04/2013   CAD, multiple vessel 08/15/2013   Hyperlipidemia 08/15/2013   Essential hypertension 08/15/2013   Other fatigue 08/15/2013   Tubular adenoma of colon 08/15/2013   Need for tetanus booster 08/15/2013   PCP:  Theotis Burrow, MD Pharmacy:   [facility]  ([place], [facility] [address] [place] [address] Phone: [phone] Fax: [phone]  [id number] [place], [place] [address] [place] [address] Phone: [phone] Fax: [phone]  [facility] [place], [place] [address] Phone: [phone] Fax: [phone]  [facility] [address] [address] Phone: [phone] Fax: [phone]     Social Determinants of Health (SDOH) Interventions     Readmission Risk Interventions Readmission Risk Prevention Plan 11/20/2020 11/20/2020 11/14/2020  Transportation Screening Complete Complete Complete  PCP or Specialist Appt within 3-5 Days Not Complete - Complete  HRI or Home Care Consult Complete - Complete  Social Work Consult for [place] Planning/Counseling Complete - Complete  Palliative Care Screening Not Applicable - Not Applicable  Medication Review Press photographer) Complete - Complete  Some recent data might be hidden

## 2020-12-06 NOTE — Progress Notes (Signed)
Patient arrived to unit in stable condition from ER. Patient alert and oriented. Verbalizes and demonstrates correct use of call bell system. Bed alarm activated due to high fall status.

## 2020-12-06 NOTE — Progress Notes (Signed)
Progress Note  Patient Name: Ricky Abbott Date of Encounter: 12/06/2020  Primary Cardiologist: Rockey Situ  Subjective   No chest pain, dyspnea, or palpitations. He did have a brief episode of dizziness with associated presyncope this morning just prior to voiding.   Inpatient Medications    Scheduled Meds:  aspirin EC  81 mg Oral Daily   clopidogrel  75 mg Oral Daily   enoxaparin (LOVENOX) injection  40 mg Subcutaneous Q24H   ezetimibe  10 mg Oral Daily   lidocaine  1 patch Transdermal Q24H   midodrine  5 mg Oral TID WC   pantoprazole  40 mg Oral Daily   rosuvastatin  40 mg Oral QHS   sertraline  100 mg Oral Daily   Continuous Infusions:  PRN Meds: acetaminophen **OR** acetaminophen, albuterol, nitroGLYCERIN, ondansetron **OR** ondansetron (ZOFRAN) IV, zolpidem   Vital Signs    Vitals:   12/06/20 0205 12/06/20 0521 12/06/20 0810 12/06/20 1203  BP:  115/67 123/67 (!) 146/65  Pulse:  95 94 94  Resp:  18 17 15   Temp:  98.2 F (36.8 C) 98 F (36.7 C) 98 F (36.7 C)  TempSrc:   Oral Oral  SpO2:  95% 97% 98%  Weight: 74.2 kg     Height:        Intake/Output Summary (Last 24 hours) at 12/06/2020 1220 Last data filed at 12/06/2020 1026 Gross per 24 hour  Intake 360 ml  Output --  Net 360 ml   Filed Weights   12/04/20 1221 12/06/20 0205  Weight: 73.9 kg 74.2 kg    Telemetry    Sinus rhythm with sinus tachycardia with rates predominantly in the 90s to low 100s bpm with brief episode into the 130s bpm - Personally Reviewed  ECG    No new tracings - Personally Reviewed  Physical Exam   GEN: No acute distress.   Neck: No JVD. Cardiac: RRR, no murmurs, rubs, or gallops.  Respiratory: Clear to auscultation bilaterally.  GI: Soft, nontender, non-distended.   MS: No edema; No deformity. Neuro:  Alert and oriented x 3; Nonfocal.  Psych: Normal affect.  Labs    Chemistry Recent Labs  Lab 12/04/20 1221 12/04/20 1327 12/06/20 0451  NA 136  --   139  K 5.2*  --  4.3  CL 103  --  107  CO2 26  --  27  GLUCOSE 130*  --  179*  BUN 22  --  15  CREATININE 1.03  --  0.92  CALCIUM 9.6  --  9.0  PROT  --  7.1  --   ALBUMIN  --  3.7  --   AST  --  45*  --   ALT  --  33  --   ALKPHOS  --  88  --   BILITOT  --  1.3*  --   GFRNONAA >60  --  >60  ANIONGAP 7  --  5     Hematology Recent Labs  Lab 12/04/20 1221  WBC 7.2  RBC 4.60  HGB 12.6*  HCT 38.8*  MCV 84.3  MCH 27.4  MCHC 32.5  RDW 14.9  PLT 421*    Cardiac EnzymesNo results for input(s): TROPONINI in the last 168 hours. No results for input(s): TROPIPOC in the last 168 hours.   BNP Recent Labs  Lab 12/04/20 1931  BNP 163.5*     DDimer  Recent Labs  Lab 12/04/20 1327  DDIMER 3.23*  Radiology    DG Chest 2 View  Result Date: 12/04/2020 IMPRESSION: No acute cardiopulmonary finding. Electronically Signed   By: Yetta Glassman M.D.   On: 12/04/2020 14:34   DG Thoracic Spine 2 View  Result Date: 12/04/2020 IMPRESSION: 1.  No fracture or dislocation of the thoracic or lumbar spine. 2. Moderate multilevel disc space height loss and osteophytosis throughout, with bridging osteophytes at many thoracic levels. Mild to moderate facet degenerative change, worst at the lower lumbar levels. 3. Disc and neural foraminal pathology may be further evaluated by MRI if indicated by neurologically localizing signs and symptoms. Electronically Signed   By: Delanna Ahmadi M.D.   On: 12/04/2020 14:35   DG Lumbar Spine Complete  Result Date: 12/04/2020 IMPRESSION: 1.  No fracture or dislocation of the thoracic or lumbar spine. 2. Moderate multilevel disc space height loss and osteophytosis throughout, with bridging osteophytes at many thoracic levels. Mild to moderate facet degenerative change, worst at the lower lumbar levels. 3. Disc and neural foraminal pathology may be further evaluated by MRI if indicated by neurologically localizing signs and symptoms. Electronically  Signed   By: Delanna Ahmadi M.D.   On: 12/04/2020 14:35   CT HEAD WO CONTRAST (5MM)  Result Date: 12/04/2020 IMPRESSION: Chronic changes with no acute intracranial process identified. Electronically Signed   By: Ofilia Neas   On: 12/04/2020 13:20   CT Angio Chest PE W and/or Wo Contrast  Result Date: 12/04/2020 IMPRESSION: 1. Negative examination for pulmonary embolism. 2. Trace bilateral pleural effusions and associated atelectasis or consolidation. 3. Coronary artery disease. Aortic Atherosclerosis (ICD10-I70.0). Electronically Signed   By: Delanna Ahmadi M.D.   On: 12/04/2020 16:51   CT Cervical Spine Wo Contrast  Result Date: 12/04/2020 IMPRESSION: 1. No fracture or static subluxation of the cervical spine. 2. Mild disc space height loss and osteophytosis throughout the cervical spine, with bridging osteophytes at C2-C3 and C5-C6. 3. Minimally displaced fracture of the posterior left first rib, partially imaged. Electronically Signed   By: Delanna Ahmadi M.D.   On: 12/04/2020 14:38   MR BRAIN WO CONTRAST  Result Date: 12/04/2020 IMPRESSION: 1. No acute intracranial abnormality. 2. Age-related cerebral atrophy with mild chronic small vessel ischemic disease, with a few scattered remote lacunar infarcts about the hemispheric cerebral white matter. 3. Degenerative disc bulge with resultant mild-to-moderate spinal stenosis at C3-4, partially visualized. Finding could be further assessed with dedicated MRI of the cervical spine as clinically warranted. Electronically Signed   By: Jeannine Boga M.D.   On: 12/04/2020 21:58    Cardiac Studies   Intraoperative TEE 84/1660: Complications: No known complications during this procedure.  POST-OP IMPRESSIONS  _ Left Ventricle: has mildly reduced systolic function. The cavity size  was normal. The wall motion is normal.  _ Right Ventricle: The right ventricle appears unchanged from pre-bypass.  _ Aorta: The aorta appears unchanged from  pre-bypass.  _ Left Atrial Appendage: The left atrial appendage appears unchanged from pre-bypass.  _ Aortic Valve: The aortic valve appears unchanged from pre-bypass.  _ Mitral Valve: The mitral valve appears unchanged from pre-bypass.  _ Tricuspid Valve: The tricuspid valve appears unchanged from pre-bypass.  _ Interatrial Septum: The interatrial septum appears unchanged from  pre-bypass.  _ Pericardium: The pericardium appears unchanged from pre-bypass.  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 No new or  worsening wall motion or valvular abnormalities. Mildly reduced EF 45-50% unchanged.  __________   2D echo 10/2020:  1. Distal septal and inferior hypokinesis . Left ventricular ejection  fraction, by estimation, is 45 to 50%. The left ventricle has mildly  decreased function. The left ventricle demonstrates regional wall motion  abnormalities (see scoring  diagram/findings for description). There is mild left ventricular  hypertrophy. Left ventricular diastolic parameters were normal.   2. Right ventricular systolic function is normal. The right ventricular  size is normal.   3. The mitral valve is abnormal. Trivial mitral valve regurgitation. No  evidence of mitral stenosis.   4. The aortic valve is tricuspid. Aortic valve regurgitation is not  visualized. Mild to moderate aortic valve sclerosis/calcification is  present, without any evidence of aortic stenosis.   5. The inferior vena cava is normal in size with greater than 50%  respiratory variability, suggesting right atrial pressure of 3 mmHg.  __________   LHC 10/2020: Conclusions: Severe three-vessel coronary artery disease, as detailed below. Low normal left ventricular systolic function (LVEF 53-20%) with mid and apical inferior hypokinesis.  Mildly elevated left ventricular filling pressure (LVEDP 20-25 mmHg).   Recommendations: [facility] for [facility] consultation. Hold clopidogrel  pending surgical consultation. Restart IV heparin 2 hours after TR band removal. Aggressive secondary prevention.  Patient Profile     75 y.o. 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   Assessment & Plan    1. Syncope: -Felt to be related to orthostasis and deconditioning (declined rehab facility at discharge from [facility]) -Continue newly started midodrine -IV fluids today -Follow orthostatic vitals -Repeat echo -Monitor on telemetry  -Recommend outpatient cardiac monitoring at discharge  -Beta blocker and Flomax held -PT/OT  2. CAD s/p 4-vessel CABG on 11/13/2020: -No chest pain -High sensitivity troponin negative -Continue ASA and Plavix  -No plans for inpatient ischemic evaluation at this time  3. HLD: -LDL 67 in 10/2020 -Crestor, Zetia  4. Prolonged QT: -Trend EKG -Avoid QT prolonging medications   For questions or updates, please contact [facility] Please consult [email or web address] for contact info under Cardiology/STEMI.    Signed, Christell Faith, PA-C [place] Pager: [phone] 12/06/2020, 12:20 PM

## 2020-12-06 NOTE — Progress Notes (Signed)
*  PRELIMINARY RESULTS* Echocardiogram 2D Echocardiogram has been performed.  Sherrie Sport 12/06/2020, 2:32 PM

## 2020-12-07 ENCOUNTER — Other Ambulatory Visit: Payer: Self-pay | Admitting: Physician Assistant

## 2020-12-07 ENCOUNTER — Other Ambulatory Visit: Payer: Self-pay

## 2020-12-07 DIAGNOSIS — Z951 Presence of aortocoronary bypass graft: Secondary | ICD-10-CM

## 2020-12-07 DIAGNOSIS — R55 Syncope and collapse: Secondary | ICD-10-CM

## 2020-12-07 DIAGNOSIS — I10 Essential (primary) hypertension: Secondary | ICD-10-CM | POA: Diagnosis present

## 2020-12-07 LAB — BASIC METABOLIC PANEL
Anion gap: 5 (ref 5–15)
BUN: 16 mg/dL (ref 8–23)
CO2: 26 mmol/L (ref 22–32)
Calcium: 8.6 mg/dL — ABNORMAL LOW (ref 8.9–10.3)
Chloride: 107 mmol/L (ref 98–111)
Creatinine, Ser: 0.74 mg/dL (ref 0.61–1.24)
GFR, Estimated: >60 mL/min (ref >60)
Glucose, Bld: 105 mg/dL — ABNORMAL HIGH (ref 70–99)
Potassium: 4.2 mmol/L (ref 3.5–5.1)
Sodium: 138 mmol/L (ref 135–145)

## 2020-12-07 LAB — GLUCOSE, CAPILLARY: Glucose-Capillary: 98 mg/dL (ref 70–99)

## 2020-12-07 LAB — MAGNESIUM: Magnesium: 2 mg/dL (ref 1.7–2.4)

## 2020-12-07 MED ORDER — NITROGLYCERIN 0.4 MG SL SUBL: 0.4000 mg | SUBLINGUAL_TABLET | SUBLINGUAL | 0 refills | Status: DC | PRN

## 2020-12-07 MED ORDER — MIDODRINE HCL 5 MG PO TABS: 5.0000 mg | ORAL_TABLET | Freq: Three times a day (TID) | ORAL | 0 refills | Status: DC

## 2020-12-07 MED ORDER — MIDODRINE HCL 5 MG PO TABS
5.0000 mg | ORAL_TABLET | Freq: Three times a day (TID) | ORAL | 2 refills | Status: DC
  Filled 2020-12-07: qty 90, 30d supply, fill #0

## 2020-12-07 NOTE — Progress Notes (Signed)
Order for [employer]

## 2020-12-07 NOTE — Discharge Summary (Addendum)
Physician Discharge Summary  Ricky Abbott MMH:[id number] DOB: [birth year] DOA: 12/04/2020  PCP: Theotis Burrow, MD  Admit date: 12/04/2020 Discharge date: 12/07/2020  Admitted From:  Disposition:    Recommendations for Outpatient Follow-up:  Follow up with PCP in 1-2 weeks Follow-up with cardiology on discharge Discontinue tamsulosin and metoprolol due to orthostatic hypotension Midodrine increased to 5 mg p.o. 3 times daily Zio patch cardiac monitor placed at time of discharge  Home Health: No Equipment/Devices: Zio patch cardiac monitor, Shower stool  Discharge Condition: Stable CODE STATUS: Full code Diet recommendation: Heart healthy diet  History of present illness:  Ricky Abbott is a 75 year old male [employer] veteran with past medical history significant for CAD s/p CABG 11/13/2020, essential hypertension, GERD, insomnia, depression/anxiety, hyperlipidemia, COPD, history of EtOH abuse, history of tubular adenoma of colon who presented to [facility] ED on 10/25 following simple episode.  Patient reports he was up at 5 AM on 10/24 when he got up to urinate, and subsequently passed out.  He was getting ready to walk to the bathroom and he woke up in the hallway.  He crawled back to the bedroom and does not know how long he was out.  He also reports chest pain after eating breakfast on 10/24, sharp in nature lasting 10-15 seconds and resolve spontaneously without any specific intervention.  Patient reports chest pain similar to prior to CABG surgery.  Also endorses dizziness since surgery.  Patient reports currently lives alone.   In the ED, temperature 97.9 F, HR 87, RR 18, BP 113/66, SPO2 95% on room air.  Sodium 136, potassium 5.2, chloride 103, bicarb 26, BUN 22, creatinine 1.03, glucose 130.  WBC 7.2, hemoglobin 12.6, platelets 421.  AST 45, ALT 33.  D-dimer 2.23.  High sensitive troponin 6.  Chest x-ray with no acute cardiopulmonary disease process.  CT head without  contrast with chronic changes and no acute intracranial process identified.  CT C-spine without fracture or static subluxation, mild disc space height loss and osteophytosis throughout cervical spine, minimally displaced fracture posterior left first rib.  X-ray T/L-spine with no fracture/dislocation, moderate multilevel disc space disease with osteophytes.  CT a chest negative for pulmonary embolism, trace bilateral pleural effusions with associated atelectasis versus consolidation, CAD and aortic atherosclerosis.  COVID-19 PCR negative.  Influenza A/B PCR negative.  Duration consulted for further evaluation management of syncopal episode.  Hospital course:  Syncope secondary to orthostatic hypotension Patient presenting to ED following syncopal episode associated with dizziness and chest discomfort.  Underwent recent CABG 11/13/2020.  CT head and CTA chest unrevealing.  Recently started on low-dose beta-blocker following CABG and midodrine for orthostasis.  On arrival, patient had orthostatic vital signs which remained significant positive with associated dizziness and presyncopal episode observed.  His beta-blocker was discontinued as well as tamsulosin.  His midodrine was titrated up to 5 mg p.o. 3 times daily.  Patient received IV fluid bolus with continuous infusion with resolution of his orthostasis.  Patient will be set up with a Zio patch monitor by cardiology and outpatient follow-up with cardiology for review of the monitor following discharge.     Hypomagnesemia Repleted during hospitalization.   Rib fracture CT C-spine with incidental finding of left posterior first rib fracture, likely secondary to syncopal episode/fall.  No significant pain.   CAD s/p recent CABG Patient underwent CABG x4 on 11/13/2020, by Dr. Kipp Brood.  EKG with normal sinus rhythm, RBBB and nonspecific T wave changes.  Currently denies chest pain.  Beta-blocker discontinued.  Continue aspirin, Plavix, Crestor.   Outpatient follow-up with cardiology and cardiothoracic surgery.   HLD: LDL 67 on 10/30/2020. Crestor 40 mg p.o. nightly, Zetia 10 mg p.o. daily   Prolonged QTC QTC 519 MS on EKG. Avoid QTC prolonging agents   BPH: Discontinued tamsulosin in the setting of orthostasis   Anxiety/depression: Sertraline 100 mg p.o. daily   GERD: Protonix 40 mg p.o. daily  Discharge Diagnoses:  Active Problems:   CAD, multiple vessel   Hyperlipidemia   Essential hypertension   Tubular adenoma of colon   S/P coronary artery stent placement   History of hepatitis C virus infection   PTSD (post-traumatic stress disorder)   Dyspnea on exertion   COPD, mild (HCC)   Erectile dysfunction   History of alcohol abuse   Malnutrition of moderate degree   Coronary artery disease   Orthostatic hypertension    Discharge Instructions  Discharge Instructions     Call MD for:  difficulty breathing, headache or visual disturbances   Complete by: As directed    Call MD for:  extreme fatigue   Complete by: As directed    Call MD for:  persistant dizziness or light-headedness   Complete by: As directed    Call MD for:  persistant nausea and vomiting   Complete by: As directed    Call MD for:  severe uncontrolled pain   Complete by: As directed    Call MD for:  temperature >100.4   Complete by: As directed    Diet - low sodium heart healthy   Complete by: As directed    Increase activity slowly   Complete by: As directed       Allergies as of 12/07/2020       Reactions   Peanut-containing Drug Products Swelling   Swelling, rash, (Peanuts)        Medication List     STOP taking these medications    albuterol (2.5 MG/3ML) 0.083% nebulizer solution Commonly known as: PROVENTIL   guaiFENesin 600 MG 12 hr tablet Commonly known as: MUCINEX   metoprolol tartrate 25 MG tablet Commonly known as: LOPRESSOR   polyethylene glycol 17 g packet Commonly known as: MIRALAX / GLYCOLAX   tamsulosin  0.4 MG Caps capsule Commonly known as: FLOMAX   traMADol 50 MG tablet Commonly known as: ULTRAM       TAKE these medications    acetaminophen 325 MG tablet Commonly known as: TYLENOL Take 2 tablets (650 mg total) by mouth every 4 (four) hours as needed for headache or mild pain.   aspirin 81 MG EC tablet Take 1 tablet (81 mg total) by mouth daily. Swallow whole.   clopidogrel 75 MG tablet Commonly known as: PLAVIX Take 1 tablet (75 mg total) by mouth daily.   ezetimibe 10 MG tablet Commonly known as: ZETIA Take 1 tablet (10 mg total) by mouth daily.   midodrine 5 MG tablet Commonly known as: PROAMATINE Take 1 tablet (5 mg total) by mouth 3 (three) times daily with meals. What changed:  medication strength how much to take additional instructions   nitroGLYCERIN 0.4 MG SL tablet Commonly known as: NITROSTAT Place 1 tablet (0.4 mg total) under the tongue every 5 (five) minutes as needed for chest pain.   pantoprazole 40 MG tablet Commonly known as: PROTONIX Take 1 tablet (40 mg total) by mouth daily.   rosuvastatin 40 MG tablet Commonly known as: CRESTOR Take 1 tablet (40 mg total) by mouth daily.  sertraline 100 MG tablet Commonly known as: ZOLOFT Take 1 tablet (100 mg total) by mouth daily.   zolpidem 5 MG tablet Commonly known as: AMBIEN Take 1 tablet (5 mg total) by mouth at bedtime as needed for sleep.               Durable Medical Equipment  (From admission, onward)           Start     Ordered   12/07/20 1328  For home use only DME Shower stool  Once        12/07/20 1327            Follow-up Information     Revelo, Elyse Jarvis, MD. Schedule an appointment as soon as possible for a visit in 1 week(s).   Specialty: Family Medicine Contact information: [address] [place] [address] [phone]         Minna Merritts, MD .   Specialty: Cardiology Contact information: [address] [place] [address] [phone]                Allergies  Allergen Reactions   Peanut-Containing Drug Products Swelling    Swelling, rash, (Peanuts)    Consultations: Cardiology   Procedures/Studies: DG Chest 2 View  Result Date: 12/04/2020 CLINICAL DATA:  Dizziness and syncope EXAM: CHEST - 2 VIEW COMPARISON:  Chest x-ray dated November 18, 2020 FINDINGS: Cardiac and mediastinal contours are within normal limits. Status post median sternotomy and CABG. Lungs are clear. No evidence of effusion or pneumothorax. Displaced fracture. IMPRESSION: No acute cardiopulmonary finding. Electronically Signed   By: Yetta Glassman M.D.   On: 12/04/2020 14:34   DG Chest 2 View  Result Date: 11/18/2020 CLINICAL DATA:  Cough, chest pain, shortness of breath EXAM: CHEST - 2 VIEW COMPARISON:  11/15/2020 FINDINGS: Upper normal heart size post CABG. Mediastinal contours and pulmonary vascularity normal. Small bibasilar pleural effusions and accompanying atelectasis. Remaining lungs clear. No infiltrate or pneumothorax. Mild scattered endplate spur formation thoracic spine. IMPRESSION: Small bibasilar pleural effusions and atelectasis. Post CABG. Electronically Signed   By: Lavonia Dana M.D.   On: 11/18/2020 12:47   DG Chest 2 View  Result Date: 11/12/2020 CLINICAL DATA:  Preoperative evaluation for CABG EXAM: CHEST - 2 VIEW COMPARISON:  10/16/2016 FINDINGS: Normal heart size, mediastinal contours, and pulmonary vascularity. Lungs clear. No pulmonary infiltrate, pleural effusion, or pneumothorax. Minor endplate spur formation thoracic spine. IMPRESSION: No acute abnormalities. Electronically Signed   By: Lavonia Dana M.D.   On: 11/12/2020 08:05   DG Thoracic Spine 2 View  Result Date: 12/04/2020 CLINICAL DATA:  Fall EXAM: THORACIC SPINE 2 VIEWS; LUMBAR SPINE - COMPLETE 4+ VIEW COMPARISON:  None. FINDINGS: No fracture or dislocation of the thoracic or lumbar spine. Moderate multilevel disc space  height loss and osteophytosis throughout, with bridging osteophytes at many thoracic levels. Mild to moderate facet degenerative change, worst at the lower lumbar levels. Status post median sternotomy and CABG. Nonobstructive pattern of bowel gas. IMPRESSION: 1.  No fracture or dislocation of the thoracic or lumbar spine. 2. Moderate multilevel disc space height loss and osteophytosis throughout, with bridging osteophytes at many thoracic levels. Mild to moderate facet degenerative change, worst at the lower lumbar levels. 3. Disc and neural foraminal pathology may be further evaluated by MRI if indicated by neurologically localizing signs and symptoms. Electronically Signed   By: Delanna Ahmadi M.D.   On: 12/04/2020 14:35  DG Lumbar Spine Complete  Result Date: 12/04/2020 CLINICAL DATA:  Fall EXAM: THORACIC SPINE 2 VIEWS; LUMBAR SPINE - COMPLETE 4+ VIEW COMPARISON:  None. FINDINGS: No fracture or dislocation of the thoracic or lumbar spine. Moderate multilevel disc space height loss and osteophytosis throughout, with bridging osteophytes at many thoracic levels. Mild to moderate facet degenerative change, worst at the lower lumbar levels. Status post median sternotomy and CABG. Nonobstructive pattern of bowel gas. IMPRESSION: 1.  No fracture or dislocation of the thoracic or lumbar spine. 2. Moderate multilevel disc space height loss and osteophytosis throughout, with bridging osteophytes at many thoracic levels. Mild to moderate facet degenerative change, worst at the lower lumbar levels. 3. Disc and neural foraminal pathology may be further evaluated by MRI if indicated by neurologically localizing signs and symptoms. Electronically Signed   By: Delanna Ahmadi M.D.   On: 12/04/2020 14:35   CT HEAD WO CONTRAST (5MM)  Result Date: 12/04/2020 CLINICAL DATA:  Head trauma EXAM: CT HEAD WITHOUT CONTRAST TECHNIQUE: Contiguous axial images were obtained from the base of the skull through the vertex without  intravenous contrast. COMPARISON:  None. FINDINGS: Brain: No evidence of acute infarction, hemorrhage, hydrocephalus, extra-axial collection or mass lesion/mass effect. Mild cortical volume loss. Mild patchy hypodensities in the periventricular and subcortical white matter, likely secondary to chronic microvascular ischemic changes. Vascular: No hyperdense vessel or unexpected calcification. Skull: Normal. Negative for fracture or focal lesion. Sinuses/Orbits: No acute finding. Other: None. IMPRESSION: Chronic changes with no acute intracranial process identified. Electronically Signed   By: Ofilia Neas   On: 12/04/2020 13:20   CT Angio Chest PE W and/or Wo Contrast  Result Date: 12/04/2020 CLINICAL DATA:  PE suspected, fall EXAM: CT ANGIOGRAPHY CHEST WITH CONTRAST TECHNIQUE: Multidetector CT imaging of the chest was performed using the standard protocol during bolus administration of intravenous contrast. Multiplanar CT image reconstructions and MIPs were obtained to evaluate the vascular anatomy. CONTRAST:  30mL OMNIPAQUE IOHEXOL 350 MG/ML SOLN COMPARISON:  None. FINDINGS: Cardiovascular: Satisfactory opacification of the pulmonary arteries to the segmental level. No evidence of pulmonary embolism. Normal heart size. Three-vessel coronary artery calcifications and stents status post median sternotomy and CABG. No pericardial effusion. Aortic atherosclerosis. Mediastinum/Nodes: No enlarged mediastinal, hilar, or axillary lymph nodes. Thyroid gland, trachea, and esophagus demonstrate no significant findings. Lungs/Pleura: Trace bilateral pleural effusions and associated atelectasis or consolidation. Upper Abdomen: No acute abnormality. Musculoskeletal: No chest wall abnormality. Status post median sternotomy. Review of the MIP images confirms the above findings. IMPRESSION: 1. Negative examination for pulmonary embolism. 2. Trace bilateral pleural effusions and associated atelectasis or consolidation. 3.  Coronary artery disease. Aortic Atherosclerosis (ICD10-I70.0). Electronically Signed   By: Delanna Ahmadi M.D.   On: 12/04/2020 16:51   CT Cervical Spine Wo Contrast  Result Date: 12/04/2020 CLINICAL DATA:  Fall EXAM: CT CERVICAL SPINE WITHOUT CONTRAST TECHNIQUE: Multidetector CT imaging of the cervical spine was performed without intravenous contrast. Multiplanar CT image reconstructions were also generated. COMPARISON:  None. FINDINGS: Alignment: Degenerative straightening and reversal of the normal cervical lordosis. Skull base and vertebrae: No acute fracture. No primary bone lesion or focal pathologic process. Soft tissues and spinal canal: No prevertebral fluid or swelling. No visible canal hematoma. Disc levels: Mild disc space height loss and osteophytosis throughout the cervical spine, with bridging osteophytes at C2-C3 and C5-C6. Upper chest: Minimally displaced fracture of the posterior left first rib, partially imaged (series 2, image 75). Other: None. IMPRESSION: 1. No fracture or static subluxation  of the cervical spine. 2. Mild disc space height loss and osteophytosis throughout the cervical spine, with bridging osteophytes at C2-C3 and C5-C6. 3. Minimally displaced fracture of the posterior left first rib, partially imaged. Electronically Signed   By: Delanna Ahmadi M.D.   On: 12/04/2020 14:38   MR BRAIN WO CONTRAST  Result Date: 12/04/2020 CLINICAL DATA:  Initial evaluation for recurrent syncope. EXAM: MRI HEAD WITHOUT CONTRAST TECHNIQUE: Multiplanar, multiecho pulse sequences of the brain and surrounding structures were obtained without intravenous contrast. COMPARISON:  Prior head CT from earlier the same day. FINDINGS: Brain: Diffuse prominence of the CSF containing spaces compatible with generalized age-related cerebral atrophy. Scattered patchy T2/FLAIR hyperintensity within the periventricular and deep white matter both cerebral hemispheres most consistent with chronic small vessel  ischemic disease, mild for age. Few scattered superimposed remote lacunar infarcts present about the right hemispheric cerebral white matter. Single subtle focus of diffusion abnormality seen involving the periventricular/deep white matter of the right frontal centrum semi ovale (series 5, image 32). This appears to correspond with a chronic lacunar infarct at this location, consistent with T2 shine through. No other diffusion abnormality to suggest acute or subacute ischemia. Gray-white matter differentiation maintained. No encephalomalacia to suggest chronic cortical infarction. No acute intracranial hemorrhage. No mass lesion, midline shift or mass effect. No hydrocephalus or extra-axial fluid collection. Pituitary gland suprasellar region within normal limits. Midline structures intact and normal. Vascular: Major intracranial vascular flow voids are maintained. Skull and upper cervical spine: Craniocervical junction within normal limits. Degenerative disc bulge with resultant mild-to-moderate spinal stenosis partially visualized at C3-4 (series 9, image 11). Bone marrow signal intensity within normal limits. No scalp soft tissue abnormality. Sinuses/Orbits: Globes and orbital soft tissues within normal limits. Mild scattered mucosal thickening noted within the ethmoidal air cells. Paranasal sinuses are otherwise clear. Trace left mastoid effusion, of doubtful significance. Inner ear structures grossly normal. Other: None. IMPRESSION: 1. No acute intracranial abnormality. 2. Age-related cerebral atrophy with mild chronic small vessel ischemic disease, with a few scattered remote lacunar infarcts about the hemispheric cerebral white matter. 3. Degenerative disc bulge with resultant mild-to-moderate spinal stenosis at C3-4, partially visualized. Finding could be further assessed with dedicated MRI of the cervical spine as clinically warranted. Electronically Signed   By: Jeannine Boga M.D.   On: 12/04/2020  21:58   DG Chest Port 1 View  Result Date: 11/15/2020 CLINICAL DATA:  Evaluate chest tube EXAM: PORTABLE CHEST 1 VIEW COMPARISON:  Chest x-ray dated November 14, 2020 FINDINGS: Interval extubation and removal of NG tube. Remaining support devices are stable including right IJ line bilateral chest tubes and a mediastinal drain. Decreased lung volumes with new bibasilar opacities. Possible small bilateral pleural effusions. No large pleural effusion or pneumothorax. Cardiac and mediastinal contours are unchanged post median sternotomy. IMPRESSION: Interval extubation and removal of NG tube. Remaining support devices are stable. New bibasilar opacities, likely due to atelectasis. Electronically Signed   By: Yetta Glassman M.D.   On: 11/15/2020 08:38   DG Chest Port 1 View  Result Date: 11/14/2020 CLINICAL DATA:  Status post open heart surgery EXAM: PORTABLE CHEST 1 VIEW COMPARISON:  11/13/2020 FINDINGS: Bibasilar atelectasis. Bilateral chest tubes in satisfactory position. Nasogastric tube coursing below the diaphragm. Endotracheal tube 4.8 cm above the carina. Right-sided central venous catheter projecting over the SVC. No pleural effusion or pneumothorax. Heart and mediastinal contours are unremarkable. CABG. No acute osseous abnormality. IMPRESSION: 1. Status post CABG. No pneumothorax. Support lines and tubing  in satisfactory position. Electronically Signed   By: Kathreen Devoid M.D.   On: 11/14/2020 08:51   DG Chest Port 1 View  Result Date: 11/13/2020 CLINICAL DATA:  Evaluate for pneumothorax.  Status post CABG EXAM: PORTABLE CHEST 1 VIEW COMPARISON:  11/12/2020 FINDINGS: There is a right IJ catheter with tip in the projection of the SVC. The ET tube tip is above the carina. There is a nasogastric tube with tip and side port below the GE junction. Signs of recent CABG procedure with median sternotomy noted. Mediastinal drain and bilateral chest tubes are in place. No pneumothorax identified. Platelike  atelectasis noted within the left midlung. No pleural effusion or signs of interstitial edema. IMPRESSION: 1. Status post CABG procedure. No pneumothorax. 2. Platelike atelectasis within the left midlung. Electronically Signed   By: Kerby Moors M.D.   On: 11/13/2020 19:00   ECHOCARDIOGRAM COMPLETE  Result Date: 12/06/2020    ECHOCARDIOGRAM REPORT   Patient Name:   Ricky Abbott Date of Exam: 12/06/2020 Medical Rec #:  [id number]         Height:       70.0 in Accession #:    [id number]        Weight:       163.6 lb Date of Birth:  04-03-1945         BSA:          1.916 m Patient Age:    39 years          BP:           146/65 mmHg Patient Gender: M                 HR:           94 bpm. Exam Location:  [facility] Procedure: 2D Echo, Color Doppler and Cardiac Doppler Indications:     Syncope R55  History:         Patient has prior history of Echocardiogram examinations, most                  recent 11/13/2020. CAD and Previous Myocardial Infarction, COPD;                  Risk Factors:Hypertension.  Sonographer:     Sherrie Sport Referring Phys:  [id number] Rise Mu Diagnosing Phys: Ida Rogue MD IMPRESSIONS  1. Challenging images  2. Left ventricular ejection fraction, by estimation, is 60 to 65%. The left ventricle has normal function. The left ventricle has no regional wall motion abnormalities. Left ventricular diastolic parameters are consistent with Grade I diastolic dysfunction (impaired relaxation).  3. Right ventricular systolic function is normal. The right ventricular size is normal. There is normal pulmonary artery systolic pressure.  4. The mitral valve is normal in structure. No evidence of mitral valve regurgitation. No evidence of mitral stenosis.  5. The aortic valve was not well visualized. Aortic valve regurgitation is not visualized. Mild to moderate aortic valve sclerosis/calcification is present, without any evidence of aortic stenosis.  6. The inferior vena cava is normal in size with greater  than 50% respiratory variability, suggesting right atrial pressure of 3 mmHg. FINDINGS  Left Ventricle: Left ventricular ejection fraction, by estimation, is 60 to 65%. The left ventricle has normal function. The left ventricle has no regional wall motion abnormalities. The left ventricular internal cavity size was normal in size. There is  no left ventricular hypertrophy. Left ventricular diastolic parameters are consistent with Grade I  diastolic dysfunction (impaired relaxation). Right Ventricle: The right ventricular size is normal. No increase in right ventricular wall thickness. Right ventricular systolic function is normal. There is normal pulmonary artery systolic pressure. The tricuspid regurgitant velocity is 1.87 m/s, and  with an assumed right atrial pressure of 5 mmHg, the estimated right ventricular systolic pressure is 74.9 mmHg. Left Atrium: Left atrial size was normal in size. Right Atrium: Right atrial size was normal in size. Pericardium: There is no evidence of pericardial effusion. Mitral Valve: The mitral valve is normal in structure. No evidence of mitral valve regurgitation. No evidence of mitral valve stenosis. MV peak gradient, 3.4 mmHg. The mean mitral valve gradient is 2.0 mmHg. Tricuspid Valve: The tricuspid valve is normal in structure. Tricuspid valve regurgitation is mild . No evidence of tricuspid stenosis. Aortic Valve: The aortic valve was not well visualized. Aortic valve regurgitation is not visualized. Mild to moderate aortic valve sclerosis/calcification is present, without any evidence of aortic stenosis. Aortic valve mean gradient measures 1.0 mmHg.  Aortic valve peak gradient measures 2.6 mmHg. Aortic valve area, by VTI measures 3.89 cm. Pulmonic Valve: The pulmonic valve was normal in structure. Pulmonic valve regurgitation is not visualized. No evidence of pulmonic stenosis. Aorta: The aortic root is normal in size and structure. Venous: The inferior vena cava is normal in  size with greater than 50% respiratory variability, suggesting right atrial pressure of 3 mmHg. IAS/Shunts: No atrial level shunt detected by color flow Doppler.  LEFT VENTRICLE PLAX 2D LVOT diam:     2.00 cm   Diastology LV SV:         49        LV e' medial:    6.64 cm/s LV SV Index:   26        LV E/e' medial:  9.2 LVOT Area:     3.14 cm  LV e' lateral:   10.00 cm/s                          LV E/e' lateral: 6.1  RIGHT VENTRICLE RV S prime:     8.81 cm/s RIGHT ATRIUM           Index RA Area:     20.10 cm RA Volume:   57.00 ml  29.74 ml/m  AORTIC VALVE                    PULMONIC VALVE AV Area (Vmax):    2.86 cm     PV Vmax:        0.86 m/s AV Area (Vmean):   3.17 cm     PV Vmean:       57.450 cm/s AV Area (VTI):     3.89 cm     PV VTI:         0.137 m AV Vmax:           80.30 cm/s   PV Peak grad:   2.9 mmHg AV Vmean:          49.100 cm/s  PV Mean grad:   1.5 mmHg AV VTI:            0.126 m      RVOT Peak grad: 4 mmHg AV Peak Grad:      2.6 mmHg AV Mean Grad:      1.0 mmHg LVOT Vmax:         73.10 cm/s LVOT Vmean:  49.600 cm/s LVOT VTI:          0.156 m LVOT/AV VTI ratio: 1.24 MITRAL VALVE               TRICUSPID VALVE MV Area (PHT): 3.91 cm    TR Peak grad:   14.0 mmHg MV Area VTI:   3.02 cm    TR Vmax:        187.00 cm/s MV Peak grad:  3.4 mmHg MV Mean grad:  2.0 mmHg    SHUNTS MV Vmax:       0.92 m/s    Systemic VTI:  0.16 m MV Vmean:      65.3 cm/s   Systemic Diam: 2.00 cm MV Decel Time: 194 msec    Pulmonic VTI:  0.135 m MV E velocity: 61.30 cm/s MV A velocity: 61.30 cm/s MV E/A ratio:  1.00 Ida Rogue MD Electronically signed by Ida Rogue MD Signature Date/Time: 12/06/2020/7:18:52 PM    Final    ECHO INTRAOPERATIVE TEE  Result Date: 11/13/2020  *INTRAOPERATIVE TRANSESOPHAGEAL REPORT *  Patient Name:   Ricky Abbott Date of Exam: 11/13/2020 Medical Rec #:  [id number]         Height:       71.0 in Accession #:    [id number]        Weight:       179.8 lb Date of Birth:  17-Jun-1945          BSA:          2.02 m Patient Age:    80 years          BP:           145/78 mmHg Patient Gender: M                 HR:           65 bpm. Exam Location:  Anesthesiology Transesophogeal exam was perform intraoperatively during surgical procedure. Patient was closely monitored under general anesthesia during the entirety of examination. Indications:     CAD Performing Phys: [id number] Lucile Crater LIGHTFOOT Diagnosing Phys: Belenda Cruise Stoltzfus Complications: No known complications during this procedure. POST-OP IMPRESSIONS _ Left Ventricle: has mildly reduced systolic function. The cavity size was normal. The wall motion is normal. _ Right Ventricle: The right ventricle appears unchanged from pre-bypass. _ Aorta: The aorta appears unchanged from pre-bypass. _ Left Atrial Appendage: The left atrial appendage appears unchanged from pre-bypass. _ Aortic Valve: The aortic valve appears unchanged from pre-bypass. _ Mitral Valve: The mitral valve appears unchanged from pre-bypass. _ Tricuspid Valve: The tricuspid valve appears unchanged from pre-bypass. _ Interatrial Septum: The interatrial septum appears unchanged from pre-bypass. _ Pericardium: The pericardium appears unchanged from pre-bypass. 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 No new or worsening wall motion or valvular abnormalities. Mildly reduced EF 45-50% unchanged. PRE-OP FINDINGS  Left Ventricle: The left ventricle has mildly reduced systolic function, with an ejection fraction of 45-50%. The cavity size was normal. No evidence of left ventricular regional wall motion abnormalities. There is borderline left ventricular hypertrophy. Left ventricular diastolic parameters were normal. Right Ventricle: The right ventricle has normal systolic function. The cavity was normal. There is no increase in right ventricular wall thickness. Left Atrium: Left atrial size was normal in size. No left atrial/left atrial appendage thrombus was detected. Left  atrial appendage velocity is reduced at less than 40 cm/s. Right Atrium: Right atrial size was normal in size. Interatrial  Septum: No atrial level shunt detected by color flow Doppler. The interatrial septum appears to be lipomatous. There is no evidence of a patent foramen ovale. Pericardium: Trivial pericardial effusion is present. The pericardial effusion is surrounding the apex. There is no pleural effusion. Mitral Valve: The mitral valve is normal in structure. Mitral valve regurgitation is mild by color flow Doppler. There is no evidence of mitral valve vegetation. There is No evidence of mitral stenosis. Tricuspid Valve: The tricuspid valve was normal in structure. Tricuspid valve regurgitation was not visualized by color flow Doppler. No evidence of tricuspid stenosis is present. There is no evidence of tricuspid valve vegetation. Aortic Valve: The aortic valve is tricuspid Aortic valve regurgitation was not visualized by color flow Doppler. There is no stenosis of the aortic valve, with a calculated valve area of 2.12 cm. There is no evidence of aortic valve vegetation. Pulmonic Valve: The pulmonic valve was normal in structure, with normal. No evidence of pumonic stenosis. Pulmonic valve regurgitation is not visualized by color flow Doppler. Aorta: The ascending aorta and aortic root are normal in size and structure. Pulmonary Artery: The pulmonary artery is of normal size. Venous: The inferior vena cava is normal in size with less than 50% respiratory variability, suggesting right atrial pressure of 8 mmHg. Shunts: There is no evidence of an atrial septal defect. +--------------+--------++ LEFT VENTRICLE         +----------------+---------++ +--------------+--------++ Diastology                PLAX 2D                +----------------+---------++ +--------------+--------++ LV e' lateral:  6.69 cm/s LVIDd:        5.12 cm  +----------------+---------++ +--------------+--------++ LV E/e'  lateral:8.1       LVIDs:        3.89 cm  +----------------+---------++ +--------------+--------++ LV e' medial:   5.56 cm/s LV PW:        1.10 cm  +----------------+---------++ +--------------+--------++ LV E/e' medial: 9.8       LV IVS:       1.30 cm  +----------------+---------++ +--------------+--------++ LVOT diam:    2.10 cm  +----------------------+-------++ +--------------+--------++ 2D Longitudinal Strain        LV SV:        59 ml    +----------------------+-------++ +--------------+--------++ 2D Strain GLS (A2C):  -12.1 % LV SV Index:  29.29    +----------------------+-------++ +--------------+--------++ 2D Strain GLS (A3C):  -11.8 % LVOT Area:    3.46 cm +----------------------+-------++ +--------------+--------++ 2D Strain GLS (A4C):  -16.4 %                        +----------------------+-------++ +--------------+--------++ 2D Strain GLS Avg:    -13.4 %                            +----------------------+-------++                             +------------+--------++                            3D Volume EF                                    +------------+--------++  LV 3D EDV:  91.00 ml                            +------------+--------++                            LV 3D ESV:  50.00 ml                            +------------+--------++ +------------------+-----------++ AORTIC VALVE                  +------------------+-----------++ AV Area (Vmax):   2.06 cm    +------------------+-----------++ AV Area (Vmean):  1.61 cm    +------------------+-----------++ AV Area (VTI):    2.12 cm    +------------------+-----------++ AV Vmax:          125.00 cm/s +------------------+-----------++ AV Vmean:         85.800 cm/s +------------------+-----------++ AV VTI:           0.310 m     +------------------+-----------++ AV Peak Grad:     6.2 mmHg    +------------------+-----------++  AV Mean Grad:     3.0 mmHg    +------------------+-----------++ LVOT Vmax:        74.50 cm/s  +------------------+-----------++ LVOT Vmean:       40.000 cm/s +------------------+-----------++ LVOT VTI:         0.190 m     +------------------+-----------++ LVOT/AV VTI ratio:0.61        +------------------+-----------++  +--------------+-------++ AORTA                 +--------------+-------++ Ao Sinus diam:3.50 cm +--------------+-------++ Ao STJ diam:  3.0 cm  +--------------+-------++ Ao Asc diam:  3.40 cm +--------------+-------++ +--------------+----------++ MITRAL VALVE             +--------------+-------+ +--------------+----------++ SHUNTS                MV Area (PHT):3.77 cm   +--------------+-------+ +--------------+----------++ Systemic VTI: 0.19 m  MV Peak grad: 1.6 mmHg   +--------------+-------+ +--------------+----------++ Systemic Diam:2.10 cm MV Mean grad: 1.0 mmHg   +--------------+-------+ +--------------+----------++ MV Vmax:      0.62 m/s   +--------------+----------++ MV Vmean:     35.8 cm/s  +--------------+----------++ MV PHT:       58.29 msec +--------------+----------++ MV Decel Time:201 msec   +--------------+----------++ +--------------+----------++ MV E velocity:54.40 cm/s +--------------+----------++ MV A velocity:61.00 cm/s +--------------+----------++ MV E/A ratio: 0.89       +--------------+----------++  Rochele Pages Electronically signed by Rochele Pages Signature Date/Time: 11/13/2020/5:15:02 PM    Final    VAS US DOPPLER PRE CABG  Result Date: 11/11/2020 PREOPERATIVE VASCULAR EVALUATION Patient Name:  ASAR EVILSIZER  Date of Exam:   11/09/2020 Medical Rec #: [id number]          Accession #:    [id number] Date of Birth: 04/04/1945          Patient Gender: M Patient Age:   37 years Exam Location:  [facility] Procedure:      VAS US DOPPLER PRE CABG Referring Phys: HARRELL  LIGHTFOOT --------------------------------------------------------------------------------  Indications:      Pre-CABG. Risk Factors:     Hypertension, hyperlipidemia, Diabetes. Comparison Study: No prior studies. Performing Technologist: Carlos Levering RVT  Examination Guidelines: A complete evaluation includes B-mode imaging, spectral Doppler, color Doppler, and power Doppler as needed of all accessible portions  of each vessel. Bilateral testing is considered an integral part of a complete examination. Limited examinations for reoccurring indications may be performed as noted.  Right Carotid Findings: +----------+--------+--------+--------+-----------------------+--------+           PSV cm/sEDV cm/sStenosisDescribe               Comments +----------+--------+--------+--------+-----------------------+--------+ CCA Prox  51      11              smooth and heterogenous         +----------+--------+--------+--------+-----------------------+--------+ CCA Distal57      14              smooth and heterogenous         +----------+--------+--------+--------+-----------------------+--------+ ICA Prox  76      24              smooth and heterogenous         +----------+--------+--------+--------+-----------------------+--------+ ICA Distal52      15                                     tortuous +----------+--------+--------+--------+-----------------------+--------+ ECA       168     22                                              +----------+--------+--------+--------+-----------------------+--------+ +----------+--------+-------+--------+------------+           PSV cm/sEDV cmsDescribeArm Pressure +----------+--------+-------+--------+------------+ Subclavian122                                 +----------+--------+-------+--------+------------+ +---------+--------+--+--------+-+---------+ VertebralPSV cm/s28EDV cm/s9Antegrade  +---------+--------+--+--------+-+---------+ Left Carotid Findings: +----------+--------+--------+--------+-----------------------+--------+           PSV cm/sEDV cm/sStenosisDescribe               Comments +----------+--------+--------+--------+-----------------------+--------+ CCA Prox  66      16              smooth and heterogenous         +----------+--------+--------+--------+-----------------------+--------+ CCA Distal78      17              smooth and heterogenous         +----------+--------+--------+--------+-----------------------+--------+ ICA Prox  82      22              smooth and heterogenous         +----------+--------+--------+--------+-----------------------+--------+ ICA Distal51      20                                     tortuous +----------+--------+--------+--------+-----------------------+--------+ ECA       118     11                                              +----------+--------+--------+--------+-----------------------+--------+ +----------+--------+--------+--------+------------+ SubclavianPSV cm/sEDV cm/sDescribeArm Pressure +----------+--------+--------+--------+------------+           125                                  +----------+--------+--------+--------+------------+ +---------+--------+--+--------+--+---------+  VertebralPSV cm/s31EDV cm/s10Antegrade +---------+--------+--+--------+--+---------+  ABI Findings: +--------+------------------+-----+----------+--------+ Right   Rt Pressure (mmHg)IndexWaveform  Comment  +--------+------------------+-----+----------+--------+ [id number]                    triphasic          +--------+------------------+-----+----------+--------+ PTA     118               1.03 biphasic           +--------+------------------+-----+----------+--------+ DP      108               0.94 monophasic         +--------+------------------+-----+----------+--------+  +--------+------------------+-----+---------+-------+ Left    Lt Pressure (mmHg)IndexWaveform Comment +--------+------------------+-----+---------+-------+ [id number]                    triphasic        +--------+------------------+-----+---------+-------+ PTA     119               1.03 triphasic        +--------+------------------+-----+---------+-------+ DP      124               1.08 triphasic        +--------+------------------+-----+---------+-------+ +-------+---------------+----------------+ ABI/TBIToday's ABI/TBIPrevious ABI/TBI +-------+---------------+----------------+ Right  1.03                            +-------+---------------+----------------+ Left   1.08                            +-------+---------------+----------------+  Right Doppler Findings: +--------+--------+-----+---------+--------+ Site    PressureIndexDoppler  Comments +--------+--------+-----+---------+--------+ [id number]          triphasic         +--------+--------+-----+---------+--------+ Radial               triphasic         +--------+--------+-----+---------+--------+ Ulnar                triphasic         +--------+--------+-----+---------+--------+  Left Doppler Findings: +--------+--------+-----+---------+--------+ Site    PressureIndexDoppler  Comments +--------+--------+-----+---------+--------+ [id number]          triphasic         +--------+--------+-----+---------+--------+ Radial               triphasic         +--------+--------+-----+---------+--------+ Ulnar                triphasic         +--------+--------+-----+---------+--------+  Summary: Right Carotid: Velocities in the right ICA are consistent with a 1-39% stenosis. Left Carotid: Velocities in the left ICA are consistent with a 1-39% stenosis. Vertebrals: Bilateral vertebral arteries demonstrate antegrade flow. Right ABI: Resting right ankle-brachial index is within normal  range. No evidence of significant right lower extremity arterial disease. Left ABI: Resting left ankle-brachial index is within normal range. No evidence of significant left lower extremity arterial disease. Right Upper Extremity: Doppler waveform obliterate with right radial compression. Doppler waveform obliterate with right ulnar compression. Left Upper Extremity: Doppler waveform obliterate with left radial compression. Doppler waveforms remain within normal limits with left ulnar compression.  Electronically signed by Harold Barban MD on 11/11/2020 at 9:29:57 AM.    Final    VAS [place] LOWER EXTREMITY VENOUS (DVT)  Result Date: 11/11/2020  Lower Venous DVT Study Patient Name:  ROCKIE  Valeta Harms  Date of Exam:   11/11/2020 Medical Rec #: [id number]          Accession #:    [id number] Date of Birth: 04-15-1945          Patient Gender: M Patient Age:   55 years Exam Location:  [facility] Procedure:      VAS [place] LOWER EXTREMITY VENOUS (DVT) Referring Phys: Minus Breeding --------------------------------------------------------------------------------  Indications: Pain, and Calf cramping.  Comparison Study: No prior study Performing Technologist: Sharion Dove RVS  Examination Guidelines: A complete evaluation includes B-mode imaging, spectral Doppler, color Doppler, and power Doppler as needed of all accessible portions of each vessel. Bilateral testing is considered an integral part of a complete examination. Limited examinations for reoccurring indications may be performed as noted. The reflux portion of the exam is performed with the patient in reverse Trendelenburg.  +---------+---------------+---------+-----------+----------+--------------+ RIGHT    CompressibilityPhasicitySpontaneityPropertiesThrombus Aging +---------+---------------+---------+-----------+----------+--------------+ CFV      Full           Yes      Yes                                  +---------+---------------+---------+-----------+----------+--------------+ SFJ      Full                                                        +---------+---------------+---------+-----------+----------+--------------+ FV Prox  Full                                                        +---------+---------------+---------+-----------+----------+--------------+ FV Mid   Full                                                        +---------+---------------+---------+-----------+----------+--------------+ FV DistalFull                                                        +---------+---------------+---------+-----------+----------+--------------+ PFV      Full                                                        +---------+---------------+---------+-----------+----------+--------------+ POP      Full           Yes                                          +---------+---------------+---------+-----------+----------+--------------+ PTV      Full                                                        +---------+---------------+---------+-----------+----------+--------------+  PERO     Full                                                        +---------+---------------+---------+-----------+----------+--------------+ Soleal   Full                                                        +---------+---------------+---------+-----------+----------+--------------+ Gastroc  Full                                                        +---------+---------------+---------+-----------+----------+--------------+ GSV      Full                                                        +---------+---------------+---------+-----------+----------+--------------+   +----+---------------+---------+-----------+----------+--------------+ LEFTCompressibilityPhasicitySpontaneityPropertiesThrombus Aging  +----+---------------+---------+-----------+----------+--------------+ CFV Full           Yes      Yes                                 +----+---------------+---------+-----------+----------+--------------+     Summary: RIGHT: - No evidence of deep vein thrombosis in the lower extremity. No indirect evidence of obstruction proximal to the inguinal ligament.  LEFT: - No evidence of common femoral vein obstruction.  *See table(s) above for measurements and observations. Electronically signed by Harold Barban MD on 11/11/2020 at 4:05:25 PM.    Final      Subjective: Patient seen examined at bedside, resting comfortably.  Sitting in bedside chair.  No complaints this morning.  Repeat orthostatic vital signs this morning normal without any dizziness or presyncopal feelings.  Seen by cardiology and will have Zio patch placed.  Ready for discharge home.  No other questions or concerns at this time.  Denies headache, no dizziness, no visual changes, no chest pain, no palpitations, no shortness of breath, no abdominal pain, no weakness, no fatigue, no paresthesias.  No acute events overnight per nursing staff.  Discharge Exam: Vitals:   12/07/20 0742 12/07/20 1135  BP: 132/65 110/72  Pulse: 77 86  Resp: 17 18  Temp: 98 F (36.7 C) 98.3 F (36.8 C)  SpO2: 100% 100%   Vitals:   12/07/20 0003 12/07/20 0537 12/07/20 0742 12/07/20 1135  BP: 122/67 134/66 132/65 110/72  Pulse: 88 84 77 86  Resp: 18 16 17 18   Temp: 97.8 F (36.6 C) 98.3 F (36.8 C) 98 F (36.7 C) 98.3 F (36.8 C)  TempSrc: Oral Oral    SpO2: 95% 98% 100% 100%  Weight:      Height:        General: Pt is alert, awake, not in acute distress Cardiovascular: RRR, S1/S2 +, no rubs, no gallops Respiratory: CTA bilaterally, no wheezing, no rhonchi, on room air Abdominal: Soft, NT,  ND, bowel sounds + Extremities: no edema, no cyanosis    The results of significant diagnostics from this hospitalization (including imaging,  microbiology, ancillary and laboratory) are listed below for reference.     Microbiology: Recent Results (from the past 240 hour(s))  Resp Panel by RT-PCR (Flu A&B, Covid) Nasopharyngeal Swab     Status: None   Collection Time: 12/04/20  6:20 PM   Specimen: Nasopharyngeal Swab; Nasopharyngeal(NP) swabs in vial transport medium  Result Value Ref Range Status   SARS Coronavirus 2 by RT PCR NEGATIVE NEGATIVE Final    Comment: (NOTE) SARS-CoV-2 target nucleic acids are NOT DETECTED.  The SARS-CoV-2 RNA is generally detectable in upper respiratory specimens during the acute phase of infection. The lowest concentration of SARS-CoV-2 viral copies this assay can detect is 138 copies/mL. A negative result does not preclude SARS-Cov-2 infection and should not be used as the sole basis for treatment or other patient management decisions. A negative result may occur with  improper specimen collection/handling, submission of specimen other than nasopharyngeal swab, presence of viral mutation(s) within the areas targeted by this assay, and inadequate number of viral copies(<138 copies/mL). A negative result must be combined with clinical observations, patient history, and epidemiological information. The expected result is Negative.  Fact Sheet for Patients:  EntrepreneurPulse.com.au  Fact Sheet for Healthcare Providers:  IncredibleEmployment.be  This test is no t yet approved or cleared by the [place] FDA and  has been authorized for detection and/or diagnosis of SARS-CoV-2 by FDA under an Emergency Use Authorization (EUA). This EUA will remain  in effect (meaning this test can be used) for the duration of the COVID-19 declaration under Section 564(b)(1) of the Act, 21 U.S.C.section 360bbb-3(b)(1), unless the authorization is terminated  or revoked sooner.       Influenza A by PCR NEGATIVE NEGATIVE Final   Influenza B by PCR NEGATIVE NEGATIVE  Final    Comment: (NOTE) The Xpert Xpress SARS-CoV-2/FLU/RSV plus assay is intended as an aid in the diagnosis of influenza from Nasopharyngeal swab specimens and should not be used as a sole basis for treatment. Nasal washings and aspirates are unacceptable for Xpert Xpress SARS-CoV-2/FLU/RSV testing.  Fact Sheet for Patients: EntrepreneurPulse.com.au  Fact Sheet for Healthcare Providers: IncredibleEmployment.be  This test is not yet approved or cleared by the [place] FDA and has been authorized for detection and/or diagnosis of SARS-CoV-2 by FDA under an Emergency Use Authorization (EUA). This EUA will remain in effect (meaning this test can be used) for the duration of the COVID-19 declaration under Section 564(b)(1) of the Act, 21 U.S.C. section 360bbb-3(b)(1), unless the authorization is terminated or revoked.  Performed at [facility], [address] [place], [place] [address]      Labs: BNP (last 3 results) Recent Labs    12/04/20 1931  BNP 662.9*   Basic Metabolic Panel: Recent Labs  Lab 12/04/20 1221 12/04/20 1931 12/06/20 0451 12/07/20 0540  NA 136  --  139 138  K 5.2*  --  4.3 4.2  CL 103  --  107 107  CO2 26  --  27 26  GLUCOSE 130*  --  179* 105*  BUN 22  --  15 16  CREATININE 1.03  --  0.92 0.74  CALCIUM 9.6  --  9.0 8.6*  MG  --  1.8 1.8 2.0   Liver Function Tests: Recent Labs  Lab 12/04/20 1327  AST 45*  ALT 33  ALKPHOS 88  BILITOT 1.3*  PROT 7.1  ALBUMIN 3.7   No results for input(s): LIPASE, AMYLASE in the last 168 hours. No results for input(s): AMMONIA in the last 168 hours. CBC: Recent Labs  Lab 12/04/20 1221  WBC 7.2  HGB 12.6*  HCT 38.8*  MCV 84.3  PLT 421*   Cardiac Enzymes: No results for input(s): CKTOTAL, CKMB, CKMBINDEX, TROPONINI in the last 168 hours. BNP: Invalid input(s): POCBNP CBG: Recent Labs  Lab 12/05/20 0854 12/06/20 0520 12/07/20 0538  GLUCAP  120* 175* 98   D-Dimer No results for input(s): DDIMER in the last 72 hours.  Hgb A1c No results for input(s): HGBA1C in the last 72 hours. Lipid Profile No results for input(s): CHOL, HDL, LDLCALC, TRIG, CHOLHDL, LDLDIRECT in the last 72 hours. Thyroid function studies Recent Labs    12/04/20 1931  TSH 1.255   Anemia work up Recent Labs    12/04/20 1931  VITAMINB12 775   Urinalysis    Component Value Date/Time   COLORURINE YELLOW (A) 12/04/2020 2221   APPEARANCEUR CLEAR (A) 12/04/2020 2221   LABSPEC 1.040 (H) 12/04/2020 2221   PHURINE 5.0 12/04/2020 2221   GLUCOSEU NEGATIVE 12/04/2020 2221   HGBUR NEGATIVE 12/04/2020 2221   [place] 12/04/2020 2221   BILIRUBINUR neg 04/07/2016 1421   KETONESUR NEGATIVE 12/04/2020 2221   PROTEINUR NEGATIVE 12/04/2020 2221   UROBILINOGEN 0.2 04/07/2016 1421   NITRITE NEGATIVE 12/04/2020 2221   LEUKOCYTESUR NEGATIVE 12/04/2020 2221   Sepsis Labs Invalid input(s): PROCALCITONIN,  WBC,  LACTICIDVEN Microbiology Recent Results (from the past 240 hour(s))  Resp Panel by RT-PCR (Flu A&B, Covid) Nasopharyngeal Swab     Status: None   Collection Time: 12/04/20  6:20 PM   Specimen: Nasopharyngeal Swab; Nasopharyngeal(NP) swabs in vial transport medium  Result Value Ref Range Status   SARS Coronavirus 2 by RT PCR NEGATIVE NEGATIVE Final    Comment: (NOTE) SARS-CoV-2 target nucleic acids are NOT DETECTED.  The SARS-CoV-2 RNA is generally detectable in upper respiratory specimens during the acute phase of infection. The lowest concentration of SARS-CoV-2 viral copies this assay can detect is 138 copies/mL. A negative result does not preclude SARS-Cov-2 infection and should not be used as the sole basis for treatment or other patient management decisions. A negative result may occur with  improper specimen collection/handling, submission of specimen other than nasopharyngeal swab, presence of viral mutation(s) within the areas  targeted by this assay, and inadequate number of viral copies(<138 copies/mL). A negative result must be combined with clinical observations, patient history, and epidemiological information. The expected result is Negative.  Fact Sheet for Patients:  EntrepreneurPulse.com.au  Fact Sheet for Healthcare Providers:  IncredibleEmployment.be  This test is no t yet approved or cleared by the [place] FDA and  has been authorized for detection and/or diagnosis of SARS-CoV-2 by FDA under an Emergency Use Authorization (EUA). This EUA will remain  in effect (meaning this test can be used) for the duration of the COVID-19 declaration under Section 564(b)(1) of the Act, 21 U.S.C.section 360bbb-3(b)(1), unless the authorization is terminated  or revoked sooner.       Influenza A by PCR NEGATIVE NEGATIVE Final   Influenza B by PCR NEGATIVE NEGATIVE Final    Comment: (NOTE) The Xpert Xpress SARS-CoV-2/FLU/RSV plus assay is intended as an aid in the diagnosis of influenza from Nasopharyngeal swab specimens and should not be used as a sole basis for treatment. Nasal washings and aspirates are unacceptable for Xpert Xpress SARS-CoV-2/FLU/RSV testing.  Fact  Sheet for Patients: EntrepreneurPulse.com.au  Fact Sheet for Healthcare Providers: IncredibleEmployment.be  This test is not yet approved or cleared by the [place] FDA and has been authorized for detection and/or diagnosis of SARS-CoV-2 by FDA under an Emergency Use Authorization (EUA). This EUA will remain in effect (meaning this test can be used) for the duration of the COVID-19 declaration under Section 564(b)(1) of the Act, 21 U.S.C. section 360bbb-3(b)(1), unless the authorization is terminated or revoked.  Performed at [facility], [address], [place], [place] [address]      Time coordinating discharge: Over 30  minutes  SIGNED:   Osualdo Hansell J [place] DO  [facility] 12/07/2020, 1:28 PM

## 2020-12-07 NOTE — TOC Transition Note (Signed)
Transition of Care [facility]) - CM/SW Discharge Note   Patient Details  Name: Ricky Abbott MRN: [id number] Date of Birth: [birth year]  Transition of Care [facility]) CM/SW Contact:  Kerin Salen, RN Phone Number: 12/07/2020, 4:51 PM   Clinical Narrative:  Spoke with patient about discharge medications, patient states he uses [facility] because he has no transportation, called [facility] to confirm and was told it would take them at least 4 days to deliver medication. Attempted to call [facility]. Who says patient would need to apply online and include payment method, however the process will take 2-3days. Spoke with [place], they will provide patient with 4 days supply, Attending will send refills to [facility] for processing. Only one new med, Midodrine. Shower chair to be delivered to home. [facility] will provide transportation, no voucher required. TOC barriers resolved.    Final next level of care: [place] Barriers to Discharge: Barriers Resolved   Patient Goals and CMS Choice Patient states their goals for this hospitalization and ongoing recovery are:: to get home CMS Medicare.gov Compare Post Acute Care list provided to:: Patient Choice offered to / list presented to : Patient  Discharge Placement                Patient to be transferred to facility by: [facility] Name of family member notified: patient Patient and family notified of of transfer: 12/07/20  Discharge Plan and Services   Discharge Planning Services: CM Consult Post Acute Care Choice: Home Health          DME Arranged: N/A DME Agency: NA       HH Arranged: Nurse's Aide, RN, PT [place] Agency: [place] Date [facility] Agency Contacted: 12/07/20 Time [place]: [place] Representative spoke with at [place]: [place]) Interventions     Readmission Risk Interventions Readmission Risk Prevention Plan 11/20/2020 11/20/2020  11/14/2020  Transportation Screening Complete Complete Complete  PCP or Specialist Appt within 3-5 Days Not Complete - Complete  HRI or Home Care Consult Complete - Complete  Social Work Consult for [place] Planning/Counseling Complete - Complete  Palliative Care Screening Not Applicable - Not Applicable  Medication Review Press photographer) Complete - Complete  Some recent data might be hidden

## 2020-12-07 NOTE — Progress Notes (Signed)
Progress Note  Patient Name: Ricky Abbott Date of Encounter: 12/07/2020  Primary Cardiologist: Rockey Situ  Subjective   No further positional dizziness. No chest pain, dyspnea, or palpitations. Echo showed preserved LVSF, no RWMA, Gr1DD, noral RVSF and ventricular cavity size, normal PASP, aortic valve sclerosis without stenosis, and no pericardial effusion. Orthostatic vitals to be performed this morning.   Inpatient Medications    Scheduled Meds:  aspirin EC  81 mg Oral Daily   cholecalciferol  1,000 Units Oral Daily   clopidogrel  75 mg Oral Daily   enoxaparin (LOVENOX) injection  40 mg Subcutaneous Q24H   ezetimibe  10 mg Oral Daily   lidocaine  1 patch Transdermal Q24H   midodrine  5 mg Oral TID WC   pantoprazole  40 mg Oral Daily   rosuvastatin  40 mg Oral QHS   sertraline  100 mg Oral Daily   Continuous Infusions:  sodium chloride 75 mL/hr at 12/07/20 0250   PRN Meds: acetaminophen **OR** acetaminophen, albuterol, nitroGLYCERIN, ondansetron **OR** ondansetron (ZOFRAN) IV, zolpidem   Vital Signs    Vitals:   12/06/20 2048 12/07/20 0003 12/07/20 0537 12/07/20 0742  BP: 113/80 122/67 134/66 132/65  Pulse: 87 88 84 77  Resp: 16 18 16 17   Temp: 97.8 F (36.6 C) 97.8 F (36.6 C) 98.3 F (36.8 C) 98 F (36.7 C)  TempSrc:  Oral Oral   SpO2: 100% 95% 98% 100%  Weight:      Height:        Intake/Output Summary (Last 24 hours) at 12/07/2020 0753 Last data filed at 12/07/2020 0250 Gross per 24 hour  Intake 1581.37 ml  Output --  Net 1581.37 ml    Filed Weights   12/04/20 1221 12/06/20 0205  Weight: 73.9 kg 74.2 kg    Telemetry    Sinus rhythm - Personally Reviewed  ECG    No new tracings - Personally Reviewed  Physical Exam   GEN: No acute distress.   Neck: No JVD. Cardiac: RRR, no murmurs, rubs, or gallops.  Respiratory: Clear to auscultation bilaterally.  GI: Soft, nontender, non-distended.   MS: No edema; No deformity. Neuro:  Alert  and oriented x 3; Nonfocal.  Psych: Normal affect.  Labs    Chemistry Recent Labs  Lab 12/04/20 1221 12/04/20 1327 12/06/20 0451 12/07/20 0540  NA 136  --  139 138  K 5.2*  --  4.3 4.2  CL 103  --  107 107  CO2 26  --  27 26  GLUCOSE 130*  --  179* 105*  BUN 22  --  15 16  CREATININE 1.03  --  0.92 0.74  CALCIUM 9.6  --  9.0 8.6*  PROT  --  7.1  --   --   ALBUMIN  --  3.7  --   --   AST  --  45*  --   --   ALT  --  33  --   --   ALKPHOS  --  88  --   --   BILITOT  --  1.3*  --   --   GFRNONAA >60  --  >60 >60  ANIONGAP 7  --  5 5      Hematology Recent Labs  Lab 12/04/20 1221  WBC 7.2  RBC 4.60  HGB 12.6*  HCT 38.8*  MCV 84.3  MCH 27.4  MCHC 32.5  RDW 14.9  PLT 421*     Cardiac EnzymesNo results  for input(s): TROPONINI in the last 168 hours. No results for input(s): TROPIPOC in the last 168 hours.   BNP Recent Labs  Lab 12/04/20 1931  BNP 163.5*      DDimer  Recent Labs  Lab 12/04/20 1327  DDIMER 3.23*      Radiology    DG Chest 2 View  Result Date: 12/04/2020 IMPRESSION: No acute cardiopulmonary finding. Electronically Signed   By: Yetta Glassman M.D.   On: 12/04/2020 14:34   DG Thoracic Spine 2 View  Result Date: 12/04/2020 IMPRESSION: 1.  No fracture or dislocation of the thoracic or lumbar spine. 2. Moderate multilevel disc space height loss and osteophytosis throughout, with bridging osteophytes at many thoracic levels. Mild to moderate facet degenerative change, worst at the lower lumbar levels. 3. Disc and neural foraminal pathology may be further evaluated by MRI if indicated by neurologically localizing signs and symptoms. Electronically Signed   By: Delanna Ahmadi M.D.   On: 12/04/2020 14:35   DG Lumbar Spine Complete  Result Date: 12/04/2020 IMPRESSION: 1.  No fracture or dislocation of the thoracic or lumbar spine. 2. Moderate multilevel disc space height loss and osteophytosis throughout, with bridging osteophytes at many  thoracic levels. Mild to moderate facet degenerative change, worst at the lower lumbar levels. 3. Disc and neural foraminal pathology may be further evaluated by MRI if indicated by neurologically localizing signs and symptoms. Electronically Signed   By: Delanna Ahmadi M.D.   On: 12/04/2020 14:35   CT HEAD WO CONTRAST (5MM)  Result Date: 12/04/2020 IMPRESSION: Chronic changes with no acute intracranial process identified. Electronically Signed   By: Ofilia Neas   On: 12/04/2020 13:20   CT Angio Chest PE W and/or Wo Contrast  Result Date: 12/04/2020 IMPRESSION: 1. Negative examination for pulmonary embolism. 2. Trace bilateral pleural effusions and associated atelectasis or consolidation. 3. Coronary artery disease. Aortic Atherosclerosis (ICD10-I70.0). Electronically Signed   By: Delanna Ahmadi M.D.   On: 12/04/2020 16:51   CT Cervical Spine Wo Contrast  Result Date: 12/04/2020 IMPRESSION: 1. No fracture or static subluxation of the cervical spine. 2. Mild disc space height loss and osteophytosis throughout the cervical spine, with bridging osteophytes at C2-C3 and C5-C6. 3. Minimally displaced fracture of the posterior left first rib, partially imaged. Electronically Signed   By: Delanna Ahmadi M.D.   On: 12/04/2020 14:38   MR BRAIN WO CONTRAST  Result Date: 12/04/2020 IMPRESSION: 1. No acute intracranial abnormality. 2. Age-related cerebral atrophy with mild chronic small vessel ischemic disease, with a few scattered remote lacunar infarcts about the hemispheric cerebral white matter. 3. Degenerative disc bulge with resultant mild-to-moderate spinal stenosis at C3-4, partially visualized. Finding could be further assessed with dedicated MRI of the cervical spine as clinically warranted. Electronically Signed   By: Jeannine Boga M.D.   On: 12/04/2020 21:58    Cardiac Studies   2D echo 12/06/2020: 1. Challenging images   2. Left ventricular ejection fraction, by estimation, is 60  to 65%. The  left ventricle has normal function. The left ventricle has no regional  wall motion abnormalities. Left ventricular diastolic parameters are  consistent with Grade I diastolic  dysfunction (impaired relaxation).   3. Right ventricular systolic function is normal. The right ventricular  size is normal. There is normal pulmonary artery systolic pressure.   4. The mitral valve is normal in structure. No evidence of mitral valve  regurgitation. No evidence of mitral stenosis.   5. The aortic valve  was not well visualized. Aortic valve regurgitation  is not visualized. Mild to moderate aortic valve sclerosis/calcification  is present, without any evidence of aortic stenosis.   6. The inferior vena cava is normal in size with greater than 50%  respiratory variability, suggesting right atrial pressure of 3 mmHg.  __________  Intraoperative TEE 17/0017: Complications: No known complications during this procedure.  POST-OP IMPRESSIONS  _ Left Ventricle: has mildly reduced systolic function. The cavity size  was normal. The wall motion is normal.  _ Right Ventricle: The right ventricle appears unchanged from pre-bypass.  _ Aorta: The aorta appears unchanged from pre-bypass.  _ Left Atrial Appendage: The left atrial appendage appears unchanged from pre-bypass.  _ Aortic Valve: The aortic valve appears unchanged from pre-bypass.  _ Mitral Valve: The mitral valve appears unchanged from pre-bypass.  _ Tricuspid Valve: The tricuspid valve appears unchanged from pre-bypass.  _ Interatrial Septum: The interatrial septum appears unchanged from  pre-bypass.  _ Pericardium: The pericardium appears unchanged from pre-bypass.  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 No new or  worsening wall motion or valvular abnormalities. Mildly reduced EF 45-50% unchanged.  __________   2D echo 10/2020: 1. Distal septal and inferior hypokinesis . Left ventricular ejection  fraction, by  estimation, is 45 to 50%. The left ventricle has mildly  decreased function. The left ventricle demonstrates regional wall motion  abnormalities (see scoring  diagram/findings for description). There is mild left ventricular  hypertrophy. Left ventricular diastolic parameters were normal.   2. Right ventricular systolic function is normal. The right ventricular  size is normal.   3. The mitral valve is abnormal. Trivial mitral valve regurgitation. No  evidence of mitral stenosis.   4. The aortic valve is tricuspid. Aortic valve regurgitation is not  visualized. Mild to moderate aortic valve sclerosis/calcification is  present, without any evidence of aortic stenosis.   5. The inferior vena cava is normal in size with greater than 50%  respiratory variability, suggesting right atrial pressure of 3 mmHg.  __________   LHC 10/2020: Conclusions: Severe three-vessel coronary artery disease, as detailed below. Low normal left ventricular systolic function (LVEF 49-44%) with mid and apical inferior hypokinesis.  Mildly elevated left ventricular filling pressure (LVEDP 20-25 mmHg).   Recommendations: [facility] to [facility] for cardiac [facility] consultation. Hold clopidogrel pending surgical consultation. Restart IV heparin 2 hours after TR band removal. Aggressive secondary prevention.  Patient Profile     75 y.o. 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   Assessment & Plan    1. Syncope: -Felt to be related to orthostasis and deconditioning (declined rehab facility at discharge from [facility]) -Improved -Continue newly started midodrine -Remains on IV fluids with repeat orthostatic vitals pending at this time -Echo reassuring as above -Monitor on telemetry  -Recommend outpatient  cardiac monitoring at discharge, please let us know prior to discharge  -Beta blocker and Flomax held -PT/OT  2. CAD s/p 4-vessel CABG on 11/13/2020: -No chest pain -High sensitivity troponin negative -Continue ASA and Plavix  -No plans for inpatient ischemic evaluation at this time  3. HLD: -LDL 67 in 10/2020 -Crestor, Zetia  4. Prolonged QT: -Trend EKG -Avoid QT prolonging medications    Dispo: He may be  able to be discharged later today if orthostatic vitals are normal and he ambulates without symptoms. From a cardiac perspective, would continue to hold metoprolol and continue midodrine. I will arrange follow up in our office. Contact cardiology for Zio patch on day of discharge. Please ensure discharge summary gets faxed to his PCP, Dr. Ladoris Gene at [phone]. He will also need his PCP appointment moved up from the currently scheduled date of 01/14/2021.   For questions or updates, please contact [employer] Please consult [email or web address] for contact info under Cardiology/STEMI.    Signed, Christell Faith, PA-C [place] Pager: [phone] 12/07/2020, 7:53 AM

## 2020-12-07 NOTE — Progress Notes (Signed)
Patient is being discharged home with [facility]  Zio patch in place.  Discharge papers given and explained to patient.  He verbalized understanding.  Meds and f/u appointments reviewed.  Rx sent electronically to pharmacy.  Awaiting transportation.

## 2020-12-07 NOTE — Progress Notes (Signed)
Order for Zio placed. Respiratory therapy and RN notified.

## 2020-12-09 LAB — VITAMIN B1: Vitamin B1 (Thiamine): 139.2 nmol/L (ref 66.5–200.0)

## 2020-12-10 ENCOUNTER — Ambulatory Visit (HOSPITAL_COMMUNITY)
Admission: RE | Admit: 2020-12-10 | Discharge: 2020-12-10 | Disposition: A | Discharge status: Home / Self Care | Payer: Medicare Other | Source: Ambulatory Visit | Attending: Physician Assistant | Admitting: Physician Assistant

## 2020-12-10 DIAGNOSIS — R55 Syncope and collapse: Secondary | ICD-10-CM

## 2020-12-11 ENCOUNTER — Other Ambulatory Visit: Payer: Self-pay | Admitting: Family

## 2020-12-14 ENCOUNTER — Other Ambulatory Visit (HOSPITAL_COMMUNITY): Payer: Self-pay

## 2020-12-19 NOTE — Congregational Nurse Program (Signed)
  Dept: [place] Nurse Program Note  Date of Encounter: 12/19/2020 Client to clinic for vital sign check and support. He was recently back in the hospital following a syncopal episode. He has several upcoming appointments and had a PCP appointment today. He reports his Lopressor was stopped during his hospitalization. He is no longer receiving home health, not started  Cardiac Rehab yet. This to start following a Cardiology appointment on 11/7 per client report.    Past Medical History: Past Medical History:  Diagnosis Date   Allergy    Anxiety    Asthma    CAD, multiple vessel    COPD (chronic obstructive pulmonary disease) ([place])    Depression    Diabetes mellitus without complication (HCC)    Dyspnea    GERD (gastroesophageal reflux disease)    History of chicken pox    Hyperlipidemia    Hypertension    MI (myocardial infarction) ([place])    Prostate enlargement    PTSD (post-traumatic stress disorder)    [place] Vet   Sarcoidosis of lung ([place])     Encounter Details:

## 2020-12-21 ENCOUNTER — Other Ambulatory Visit: Payer: Self-pay

## 2020-12-21 ENCOUNTER — Encounter: Payer: Medicare Other | Attending: Cardiovascular Disease | Admitting: *Deleted

## 2020-12-21 DIAGNOSIS — Z951 Presence of aortocoronary bypass graft: Secondary | ICD-10-CM

## 2020-12-21 NOTE — Progress Notes (Signed)
Initial telephone orientation completed. Diagnosis can be found in [facility] 9/27. EP orientation scheduled fro Tuesday 11/22 at 8am.

## 2020-12-24 ENCOUNTER — Other Ambulatory Visit: Payer: Self-pay

## 2020-12-24 ENCOUNTER — Encounter: Payer: Self-pay | Admitting: Physician Assistant

## 2020-12-24 ENCOUNTER — Ambulatory Visit: Payer: Medicare Other | Admitting: Nurse Practitioner

## 2020-12-24 VITALS — BP 147/76 | HR 95 | Ht 71.0 in | Wt 163.0 lb

## 2020-12-24 DIAGNOSIS — Z951 Presence of aortocoronary bypass graft: Secondary | ICD-10-CM | POA: Diagnosis not present

## 2020-12-24 DIAGNOSIS — I951 Orthostatic hypotension: Secondary | ICD-10-CM

## 2020-12-24 DIAGNOSIS — R55 Syncope and collapse: Secondary | ICD-10-CM | POA: Diagnosis not present

## 2020-12-24 DIAGNOSIS — Z8679 Personal history of other diseases of the circulatory system: Secondary | ICD-10-CM

## 2020-12-24 DIAGNOSIS — E785 Hyperlipidemia, unspecified: Secondary | ICD-10-CM

## 2020-12-24 DIAGNOSIS — E118 Type 2 diabetes mellitus with unspecified complications: Secondary | ICD-10-CM

## 2020-12-24 DIAGNOSIS — E119 Type 2 diabetes mellitus without complications: Secondary | ICD-10-CM

## 2020-12-24 DIAGNOSIS — I10 Essential (primary) hypertension: Secondary | ICD-10-CM | POA: Diagnosis not present

## 2020-12-24 DIAGNOSIS — I2510 Atherosclerotic heart disease of native coronary artery without angina pectoris: Secondary | ICD-10-CM | POA: Diagnosis not present

## 2020-12-24 DIAGNOSIS — R9431 Abnormal electrocardiogram [ECG] [EKG]: Secondary | ICD-10-CM

## 2020-12-24 MED ORDER — MIDODRINE HCL 2.5 MG PO TABS: 2.5000 mg | ORAL_TABLET | Freq: Two times a day (BID) | ORAL | Status: DC

## 2020-12-24 NOTE — Progress Notes (Signed)
Cardiology Clinic Note   Patient Name: Ricky Abbott Date of Encounter: 12/24/2020  Primary Care Provider:  Theotis Burrow, MD Primary Cardiologist:  Ida Rogue, MD  Patient Profile    75 year-old male with a history of CAD s/p CABG x4 on 11/13/2020, syncope in the setting of orthostatic hypotension, prolonged QT, HLD, type 2 DM, hepatitis, COPD, sarcoidosis of the lung, remote tobacco use (quit in 1984), PTSD, ETOH and depression, who presents for post hospital follow-up related to syncope and CAD s/p CABG.   Past Medical History    Past Medical History:  Diagnosis Date   Allergy    Anxiety    Asthma    CAD, multiple vessel    COPD (chronic obstructive pulmonary disease) ([place])    Depression    Diabetes mellitus without complication (HCC)    Dyspnea    GERD (gastroesophageal reflux disease)    History of chicken pox    Hyperlipidemia    Hypertension    MI (myocardial infarction) ([place])    Prostate enlargement    PTSD (post-traumatic stress disorder)    [place] Vet   Sarcoidosis of lung ([place])    Past Surgical History:  Procedure Laterality Date   billary tube placement     [place], [place] N/A 06/05/2016   Procedure: LAPAROSCOPIC CHOLECYSTECTOMY with cholangiogram;  Surgeon: Jules Husbands, MD;  Location: [facility];  Service: General;  Laterality: N/A;   CORONARY ARTERY BYPASS GRAFT N/A 11/13/2020   Procedure: CORONARY ARTERY BYPASS GRAFTING (CABG) x 4 ON CARDIOPULMONARY BYPASS USING LIMA AND RIGHT GSV. -LIMA to LAD -SVG to OM -SVG to PDA -SVG to DIAGONAL;  Surgeon: Lajuana Matte, MD;  Location: [place];  Service: Open Heart Surgery;  Laterality: N/A;   [place], 05/2012   x 2   ENDOVEIN HARVEST OF GREATER SAPHENOUS VEIN Right 11/13/2020   Procedure: ENDOVEIN HARVEST OF GREATER SAPHENOUS VEIN;  Surgeon: Lajuana Matte, MD;   Location: [place];  Service: Open Heart Surgery;  Laterality: Right;   IR GENERIC HISTORICAL  04/30/2016   IR PERC CHOLECYSTOSTOMY 04/30/2016 [facility]   LEFT HEART CATH AND CORONARY ANGIOGRAPHY Left 10/06/2019   Procedure: LEFT HEART CATH AND CORONARY ANGIOGRAPHY;  Surgeon: Minna Merritts, MD;  Location: [place] [facility];  Service: Cardiovascular;  Laterality: Left;   LEFT HEART CATH AND CORONARY ANGIOGRAPHY N/A 11/06/2020   Procedure: LEFT HEART CATH AND CORONARY ANGIOGRAPHY;  Surgeon: Nelva Bush, MD;  Location: [place] [facility];  Service: Cardiovascular;  Laterality: N/A;   TEE WITHOUT CARDIOVERSION N/A 11/13/2020   Procedure: TRANSESOPHAGEAL ECHOCARDIOGRAM (TEE);  Surgeon: Lajuana Matte, MD;  Location: [place];  Service: Open Heart Surgery;  Laterality: N/A;    Allergies  Allergies  Allergen Reactions   Peanut-Containing Drug Products Swelling    Swelling, rash, (Peanuts)    History of Present Illness    75 year-old male with the above complex past medical history including CAD s/p CABG x4 on 11/13/2020, syncope in the setting of orthostatic hypotension, prolonged QT, HTN, HLD, type 2 DM, hepatitis, COPD, sarcoidosis of the lung, remote tobacco use (quit in 1984), PTSD, ETOH and depression, who presents for post hospital follow-up related to syncope and CAD s/p CABG.   He has a longstanding cardiac history including remote stents x2 (greater than 20 years ago and  again in 2014), and recent CABG x4 (11/13/2020) in the setting of multivessel disease and worsening exertional angina. His hospital recovery post-CABG was complicated by hypotension, improved on midodrine, and overall physical deconditioning. He declined SNF, and was discharged home with home health PT on 11/20/2020.   Once home, he experienced increasing dizziness, and presented to the ED on 12/04/2020 in the setting of syncope and fall. He was orthostatic in the ED. CT of the head, MRI brain, and chest x-ray  were negative. CTA chest was negative for PE. CT c-spine showed and incidental left posterior rib fracture in the setting of fall. Cardiology was consulted for evaluation of syncope. EKG showed a prolonged QTC of 519 MS. Repeat echo on 12/06/2020 showed and EF of 60-65% (improved from 45-50% intraoperatively), no regional wall motion abnormalities, GIDD. Metoprolol and tamsulosin were discontinued in the setting of orthostatic hypotension likely secondary to a combination of dehydration, medication and overall physical deconditioning. Midodrine was increased to 5 mig tid, and avoidance of QT prolonging agents was advised. A Zio patch monitor for remote telemetry monitoring was placed at discharge to assess for possible underlying arrhythmia, with plans for close outpatient follow-up.   Since his most recent hospitalization of 12/04/2020 for syncope, he reports doing well overall from a cardiac standpoint. However, he reports generalized weakness, intermittent mild dyspnea on exertion,  shakiness, and decreased balance following his discharge from the hospital. He has had no further dizziness, presyncope or syncope. He has remained off of his beta-blocker and tamulosin, and is currently taking 5 mg of midodrine bid. He has been eating well and staying hydrated. He is ambulating independently at home and plans to begin cardiac rehab in early December. He wore his Zio monitor for the full two weeks and states the post office is scheduled to pick it up from his house tomorrow for return delivery. However, he was told that his insurance would not cover the monitor. He denies chest pain, palpitations, edema, pnd, orthopnea, n/v, dizziness, weight gain, or early satiety.   Home Medications    Current Outpatient Medications  Medication Sig Dispense Refill   aspirin EC 81 MG EC tablet Take 1 tablet (81 mg total) by mouth daily. Swallow whole. 30 tablet 11   clopidogrel (PLAVIX) 75 MG tablet Take 1 tablet (75 mg  total) by mouth daily. 30 tablet 11   ezetimibe (ZETIA) 10 MG tablet Take 1 tablet (10 mg total) by mouth daily.     midodrine (PROAMATINE) 2.5 MG tablet Take 1 tablet (2.5 mg total) by mouth 2 (two) times daily with a meal. 60 tablet    nitroGLYCERIN (NITROSTAT) 0.4 MG SL tablet PLACE 1 TABLET(0.4MG  TOTAL) UNDER THE TONGUE EVERY 5 MINUTES AS NEEDED FOR CHEST PAIN 25 tablet 0   pantoprazole (PROTONIX) 40 MG tablet Take 1 tablet (40 mg total) by mouth daily.     rosuvastatin (CRESTOR) 40 MG tablet Take 1 tablet (40 mg total) by mouth daily.     sertraline (ZOLOFT) 100 MG tablet Take 1 tablet (100 mg total) by mouth daily.     tamsulosin (FLOMAX) 0.4 MG CAPS capsule Take 0.4 mg by mouth daily.     No current facility-administered medications for this visit.   Facility-Administered Medications Ordered in Other Visits  Medication Dose Route Frequency Provider Last Rate Last Admin   sodium chloride flush (NS) 0.9 % injection 3 mL  3 mL Intravenous Q12H Arvil Chaco, PA-C         Family  History    Family History  Problem Relation Age of Onset   Cancer Mother    Hyperlipidemia Mother    Hypertension Mother    Cancer Father        stomach   Hyperlipidemia Father    Hypertension Father    Cancer Brother        bone cancer   He indicated that his mother is deceased. He indicated that his father is deceased. He indicated that his sister is alive. He indicated that his brother is deceased.  Social History    Social History   Socioeconomic History   Marital status: Single    Spouse name: Not on file   Number of children: Not on file   Years of education: Not on file   Highest education level: Not on file  Occupational History   Not on file  Tobacco Use   Smoking status: Former    Packs/day: 4.00    Years: 25.00    Pack years: 100.00    Types: Cigarettes    Quit date: 03/24/1981    Years since quitting: 39.7   Smokeless tobacco: Never   Tobacco comments:    quit 30 years  asgo  Vaping Use   Vaping Use: Never used  Substance and Sexual Activity   Alcohol use: No   Drug use: No   Sexual activity: Never  Other Topics Concern   Not on file  Social History Narrative   Yvone Neu grew up in [place], [place]. He moved to the [place] area in 2013. 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   Social Determinants of Health   Financial Resource Strain: Not on file  Food Insecurity: Not on file  Transportation Needs: Not on file  Physical Activity: Not on file  Stress: Not on file  Social Connections: Not on file  Intimate Partner Violence: Not on file     Review of Systems    General:  No chills, fever, night sweats or weight changes.  Cardiovascular: mild intermittent dyspnea on exertion, no chest pain, edema, orthopnea, palpitations, paroxysmal nocturnal dyspnea. Dermatological: No rash, lesions/masses Respiratory: No cough, dyspnea Urologic: No hematuria, dysuria, urinary hesitancy with discontinuation of tamsulosin Abdominal:   No nausea, vomiting, diarrhea, bright red blood per rectum, melena, or hematemesis Neurologic:  No visual changes, changes in mental status. Generalized weakness, mild shakiness and decreased balance.  All other systems reviewed and are otherwise negative except as noted above.     Physical Exam    VS:  Ht 5\' 11"  (1.803 m)   Wt 163 lb (73.9 kg)   BMI 22.73 kg/m .     GEN: Well nourished, well developed, in no acute distress. HEENT: normal. Neck: Supple, no JVD, carotid bruits, or masses. Cardiac: RRR, no murmurs, rubs, or gallops. No clubbing, cyanosis, edema.  Radials/DP/PT 2+ and equal bilaterally.  Respiratory:  Respirations regular and unlabored, clear to auscultation bilaterally. GI: Soft, nontender, nondistended, BS + x 4. MS: no deformity or atrophy. Skin: warm and dry, no rash. Well healing median sternotomy. Neuro:  Strength and sensation are intact. Mild resting tremor.  Psych: Normal affect.  Accessory Clinical  Findings    ECG personally reviewed by me today- NSR, rate 96, LAD, RBBB - No acute changes  Lab Results  Component Value Date   WBC 7.2 12/04/2020   HGB 12.6 (L) 12/04/2020   HCT 38.8 (L) 12/04/2020   MCV 84.3 12/04/2020   PLT 421 (H) 12/04/2020   Lab Results  Component  Value Date   CREATININE 0.74 12/07/2020   BUN 16 12/07/2020   NA 138 12/07/2020   K 4.2 12/07/2020   CL 107 12/07/2020   CO2 26 12/07/2020   Lab Results  Component Value Date   ALT 33 12/04/2020   AST 45 (H) 12/04/2020   ALKPHOS 88 12/04/2020   BILITOT 1.3 (H) 12/04/2020   Lab Results  Component Value Date   CHOL 132 11/06/2020   HDL 36 (L) 11/06/2020   LDLCALC 67 11/06/2020   LDLDIRECT 101 (H) 09/09/2019   TRIG 143 11/06/2020   CHOLHDL 3.7 11/06/2020    Lab Results  Component Value Date   HGBA1C 6.2 (H) 11/12/2020    Assessment & Plan   1.  Syncope: Likely in the setting of orthostatic hypotension and physical deconditioning. No further episodes of syncope or dizziness. He does report  generalized weakness, intermittent mild dyspnea on exertion, shakiness, and decreased balance following his discharge from the hospital. Prior workup (CT head, MRI brain) reassuring at time of hospitalization. Awaiting results of ambulatory telemetry monitoring (Zio). Will check CBC in the setting of generalized weakness given DAPT post CABG.   2.  Orthostatic hypotension: Previously positive for orthostatic hypotension with syncope. Mildly orthostatic from lying to standing in office today (147/76, HR 95>>124/75, HR 116). He is generally asymptomatic. He is taking midodrine 5 mg bid. Beta blocker and tamsulosin currently on hold. Resting BP mildly elevated in office today (147/76). Will decrease midodrine to 2.5 mg bid in an attempt to slowly wean. If BP stable at next visit, will continue to wean midodrine. Given history of CAD s/p CABG x4, if able to discontinue midodrine, would ideally resume low dose metoprolol as BP  allows. Recommend follow-up with PCP for resumption of tamsulosin.   3.  CAD s/p CABG x4: Stable post-op. He is following-up with the surgeon next week. Post-op echo showed an improvement in EF (60-65%). No angina. He does report generalized weakness as above with mild dyspnea on exertion. Physical deconditioning likely contributory. Euvolemic on exam. He is scheduled to participate in cardiac rehab beginning in early December, which will likely contribute to his overall improvement and strength. Encouraged increased activity as tolerated. Continue ASA, Plavix, Zetia and Crestor. Will check CBC in the setting of generalized weakness.  4. Hypertension: Mildly elevated resting BP in office today, with orthostatic hypotension. BB and tamsulosin on hold. He is taking midodrine 5 mg bid. Continue to hold beta blocker. Decrease midodrine to 2.5 mg bid. Will attempt to further wean midodrine at next visit and consider resumption of metoprolol as BP allows.   5.  HLD: LDL 67 on 11/06/2020. Continue zetia and crestor.   6. History of Prolonged QT interval: QTC previously greater than 519 MS, improved at 472 MS. Continue to avoid QT prolonging agents.   7. Diabetes, type 2: A1C 6.2 on 11/12/2020. Managed by PCP.   8. Disposition: Decrease midodrine to 2.5 mg bid. Continue to hold beta blocker in the setting of orthostatic hypotension. Await results of Zio monitor. Check CBC in the setting of generalized weakness on DAPT s/p CABG. Follow-up in 2 weeks.   Lenna Sciara, NP 12/24/2020, 1:51 PM

## 2020-12-24 NOTE — Patient Instructions (Addendum)
Patient will call back with medications for review. Please transfer call to [facility]  Medication Instructions:  Please call us back with your medications.  *If you need a refill on your cardiac medications before your next appointment, please call your pharmacy*   Lab Work: CBC today  If you have labs (blood work) drawn today and your tests are completely normal, you will receive your results only by: [place] (if you have MyChart) OR A paper copy in the mail If you have any lab test that is abnormal or we need to change your treatment, we will call you to review the results.   Testing/Procedures: None   Follow-Up: At [facility], you and your health needs are our priority.  As part of our continuing mission to provide you with exceptional heart care, we have created designated Provider Care Teams.  These Care Teams include your primary Cardiologist (physician) and Advanced Practice Providers (APPs -  Physician Assistants and Nurse Practitioners) who all work together to provide you with the care you need, when you need it.   Your next appointment:   1 month(s)  The format for your next appointment:   In Person  Provider:   Ida Rogue, MD or Christell Faith, PA-C

## 2020-12-25 ENCOUNTER — Other Ambulatory Visit: Payer: Self-pay | Admitting: Thoracic Surgery (Cardiothoracic Vascular Surgery)

## 2020-12-25 ENCOUNTER — Telehealth: Payer: Self-pay | Admitting: Physician Assistant

## 2020-12-25 DIAGNOSIS — I2510 Atherosclerotic heart disease of native coronary artery without angina pectoris: Secondary | ICD-10-CM

## 2020-12-25 LAB — CBC
Hematocrit: 44.8 % (ref 37.5–51.0)
Hemoglobin: 14 g/dL (ref 13.0–17.7)
MCH: 25.5 pg — ABNORMAL LOW (ref 26.6–33.0)
MCHC: 31.3 g/dL — ABNORMAL LOW (ref 31.5–35.7)
MCV: 82 fL (ref 79–97)
Platelets: 276 10*3/uL (ref 150–450)
RBC: 5.49 x10E6/uL (ref 4.14–5.80)
RDW: 14.6 % (ref 11.6–15.4)
WBC: 7.7 10*3/uL (ref 3.4–10.8)

## 2020-12-25 NOTE — Telephone Encounter (Signed)
Patient calling to discuss cbc results.  He is aware when noted by provider with poc nurse will do a result call.    Patient understands and will await callback

## 2020-12-25 NOTE — Telephone Encounter (Signed)
Spoke with patient and reviewed his results. He verbalized understanding with no further questions.

## 2020-12-31 DIAGNOSIS — I4729 Other ventricular tachycardia: Secondary | ICD-10-CM

## 2020-12-31 DIAGNOSIS — I471 Supraventricular tachycardia, unspecified: Secondary | ICD-10-CM

## 2020-12-31 HISTORY — DX: Other ventricular tachycardia: I47.29

## 2020-12-31 HISTORY — DX: Supraventricular tachycardia, unspecified: I47.10

## 2021-01-01 ENCOUNTER — Ambulatory Visit: Payer: Medicare Other

## 2021-01-09 ENCOUNTER — Ambulatory Visit: Payer: Medicare Other

## 2021-01-15 ENCOUNTER — Ambulatory Visit: Payer: Medicare Other

## 2021-01-16 ENCOUNTER — Ambulatory Visit (INDEPENDENT_AMBULATORY_CARE_PROVIDER_SITE_OTHER): Payer: Self-pay | Admitting: Surgical

## 2021-01-16 ENCOUNTER — Ambulatory Visit
Admission: RE | Admit: 2021-01-16 | Discharge: 2021-01-16 | Disposition: A | Discharge status: Home / Self Care | Payer: Medicare Other | Source: Ambulatory Visit | Attending: Thoracic Surgery (Cardiothoracic Vascular Surgery) | Admitting: Thoracic Surgery (Cardiothoracic Vascular Surgery)

## 2021-01-16 ENCOUNTER — Other Ambulatory Visit: Payer: Self-pay

## 2021-01-16 VITALS — BP 144/76 | HR 109 | Resp 20 | Ht 71.0 in | Wt 161.0 lb

## 2021-01-16 DIAGNOSIS — Z951 Presence of aortocoronary bypass graft: Secondary | ICD-10-CM

## 2021-01-16 DIAGNOSIS — I2510 Atherosclerotic heart disease of native coronary artery without angina pectoris: Secondary | ICD-10-CM

## 2021-01-16 NOTE — Patient Instructions (Addendum)
Discussed routine ongoing activity progression

## 2021-01-16 NOTE — Progress Notes (Signed)
[address]       [place] [address]             [phone]      Brysten W [place] Medical Record #[id number] Date of Birth: 05-18-1945  Referring: Minna Merritts, MD Primary Care: Theotis Burrow, MD Primary Cardiologist: Ida Rogue, MD   Chief Complaint:   POST OP FOLLOW UP 11/13/2020 Patient:  Ricky Abbott Pre-Op Dx: NSTEMI                         Three-vessel coronary artery disease                         COPD                         Hypertension                         Diabetes mellitus Post-op Dx: Same Procedure: CABG X 4.  LIMA to LAD, reverse saphenous vein graft to PDA, reverse saphenous vein graft to OM1, reverse saphenous vein graft to the first diagonal. Endoscopic greater saphenous vein harvest on the right     Surgeon and Role:      * Lightfoot, Lucile Crater, MD - Primary    *D. Ezekiel Slocumb- assisting Assistant: Leretha Pol, PA-C  History of Present Illness:    The patient is a 75 year old male seen in the office on today's date and routine postsurgical follow-up.  He is status post the above described procedure.  He has been seen in follow-up by cardiology and scheduled appointment next week.  Additionally, he has missed his postsurgical follow-up due to transportation issues.  He reports that he is doing very well overall.  He did have a brief hospitalization proximately 3 days following original discharge due to syncope and orthostasis.  Medications were adjusted at that time and he has had no further recurrence.  He has had a Zio patch placed but I am uncertain of the results.  He has had no significant difficulties from a surgical perspective but does report feeling one of the sternotomy wires poking at the surface of the skin.  It is not particularly painful and he has had no drainage or other evidence of infection.  He has not had any significant chest pain or anginal equivalents.  He is not having  palpitations.  He is not having lower extremity edema.  He is not taking pain medication.  He does not drive a car.  Overall he is quite pleased with his progress and is thankful for a "second chance".      Past Medical History:  Diagnosis Date   Allergy    Anxiety    Asthma    CAD, multiple vessel    COPD (chronic obstructive pulmonary disease) ([place])    Depression    Diabetes mellitus without complication (HCC)    Dyspnea    GERD (gastroesophageal reflux disease)    History of chicken pox    Hyperlipidemia    Hypertension    MI (myocardial infarction) ([place])    Prostate enlargement    PTSD (post-traumatic stress disorder)    [place] Vet   Sarcoidosis of lung ([place])      Social History   Tobacco Use  Smoking Status Former   Packs/day: 4.00  Years: 25.00   Pack years: 100.00   Types: Cigarettes   Quit date: 03/24/1981   Years since quitting: 39.8  Smokeless Tobacco Never  Tobacco Comments   quit 30 years asgo    Social History   Substance and Sexual Activity  Alcohol Use No     Allergies  Allergen Reactions   Peanut-Containing Drug Products Swelling    Swelling, rash, (Peanuts)    Current Outpatient Medications  Medication Sig Dispense Refill   aspirin EC 81 MG EC tablet Take 1 tablet (81 mg total) by mouth daily. Swallow whole. 30 tablet 11   clopidogrel (PLAVIX) 75 MG tablet Take 1 tablet (75 mg total) by mouth daily. 30 tablet 11   ezetimibe (ZETIA) 10 MG tablet Take 1 tablet (10 mg total) by mouth daily.     midodrine (PROAMATINE) 2.5 MG tablet Take 1 tablet (2.5 mg total) by mouth 2 (two) times daily with a meal. 60 tablet    nitroGLYCERIN (NITROSTAT) 0.4 MG SL tablet PLACE 1 TABLET(0.4MG  TOTAL) UNDER THE TONGUE EVERY 5 MINUTES AS NEEDED FOR CHEST PAIN 25 tablet 0   pantoprazole (PROTONIX) 40 MG tablet Take 1 tablet (40 mg total) by mouth daily.     rosuvastatin (CRESTOR) 40 MG tablet Take 1 tablet (40 mg total) by mouth daily.     sertraline (ZOLOFT)  100 MG tablet Take 1 tablet (100 mg total) by mouth daily.     tamsulosin (FLOMAX) 0.4 MG CAPS capsule Take 0.4 mg by mouth daily.     No current facility-administered medications for this visit.   Facility-Administered Medications Ordered in Other Visits  Medication Dose Route Frequency Provider Last Rate Last Admin   sodium chloride flush (NS) 0.9 % injection 3 mL  3 mL Intravenous Q12H Visser, Jacquelyn D, PA-C           Physical Exam: BP (!) 144/76 (BP Location: Left Arm, Patient Position: Sitting)   Pulse (!) 109   Resp 20   Ht 5\' 11"  (1.803 m)   Wt 161 lb (73 kg)   SpO2 96% Comment: RA  BMI 22.45 kg/m   General appearance: alert, cooperative, and no distress Heart: regular rate and rhythm Lungs: clear to auscultation bilaterally Extremities: No edema Wound: Incisions well-healed without evidence of infection   Diagnostic Studies & Laboratory data:     Recent Radiology Findings:   DG Chest 2 View  Result Date: 01/16/2021 CLINICAL DATA:  Rare E artery disease EXAM: CHEST - 2 VIEW COMPARISON:  12/04/2020 FINDINGS: Post CABG changes. Normal heart size. Atherosclerotic calcification of the aortic knob. No focal airspace consolidation, pleural effusion, or pneumothorax. IMPRESSION: No active cardiopulmonary disease. Electronically Signed   By: Davina Poke D.O.   On: 01/16/2021 14:06      Recent Lab Findings: Lab Results  Component Value Date   WBC 7.7 12/24/2020   HGB 14.0 12/24/2020   HCT 44.8 12/24/2020   PLT 276 12/24/2020   GLUCOSE 105 (H) 12/07/2020   CHOL 132 11/06/2020   TRIG 143 11/06/2020   HDL 36 (L) 11/06/2020   LDLDIRECT 101 (H) 09/09/2019   LDLCALC 67 11/06/2020   ALT 33 12/04/2020   AST 45 (H) 12/04/2020   NA 138 12/07/2020   K 4.2 12/07/2020   CL 107 12/07/2020   CREATININE 0.74 12/07/2020   BUN 16 12/07/2020   CO2 26 12/07/2020   TSH 1.255 12/04/2020   INR 1.3 (H) 11/13/2020   HGBA1C 6.2 (H) 11/12/2020  Assessment / Plan:  Patient is doing quite well.  He is a bit hypertensive and tachycardic on today's visit.  Due to his previous difficulties with syncope, etc. I will not make any changes to his current medication regimen.  He does remain on low-dose midodrine.  Could possibly be stopped see what cardiology says next week at visit.  I am uncertain where his blood pressure and heart rate typically are on an average day.  They will also be checking results of his Zio patch.  I reviewed his chest x-ray and there are no worrisome findings.  We will see the patient again on a as needed basis for any surgically related issues or at request.  The patient is not to go to cardiac rehabilitation due to cost/insurance coverage issues.      Medication Changes: No orders of the defined types were placed in this encounter.     John Giovanni, PA-C  01/16/2021 2:10 PM

## 2021-01-21 NOTE — Progress Notes (Signed)
Cardiology Office Note  Alcohol abuse Date:  01/22/2021   ID:  Ricky Abbott, DOB Jul 08, 1945, MRN [id number]  PCP:  Theotis Burrow, MD   Chief Complaint  Patient presents with   1 month follow up     Patient c/o chest pain and shortness of breath. Medications reviewed by the patient verbally.      HPI:  75 yo caucasian male with  diabetes type 2,  PTSD,   coronary artery disease with stent placed 20 years ago in [place],  [place] in April 2014 [facility] with [facility] placed at that time  smoking, quit in 1984,  alcohol, depression, Long history of drinking 40 ounces beers Previous insurance and financial issues affecting his follow-up in clinic and medication compliance.   Previously in [place] Presents for routine follow-up for chest pain, coronary artery disease  worsening exertional angina s/p CABG 11/13/2020, CABG X 4.  LIMA to LAD, reverse saphenous vein graft to PDA, reverse saphenous vein graft to OM1, reverse saphenous vein graft to the first diagonal. Endoscopic greater saphenous vein harvest on the right  Hospital recovery post-CABG was complicated by hypotension, improved on midodrine, and overall physical deconditioning. He declined SNF, and was discharged home with home health PT on 11/20/2020.   In follow-up today he reports he cannot afford cardiac rehab, co-pay is $30 per visit Doing some activity at home but not regular exercise  He has lost tremendous weight over the past several months Weight 194 in June 2022 Now weight 159 "Keeps going down" Reports he is eating fine  Pushed his laundry basket cart up a big hill in a rapid manner recently, did not have any chest discomfort, had to stop at the top to catch his breath but otherwise felt fine  Denies any orthostasis symptoms, takes midodrine twice a day Midodrine dose was " reduced" by somebody  Echo 10/22 results reviewed  1. Challenging images   2. Left ventricular ejection  fraction, by estimation, is 60 to 65%. The  left ventricle has normal function. The left ventricle has no regional  wall motion abnormalities. Left ventricular diastolic parameters are  consistent with Grade I diastolic  dysfunction (impaired relaxation).   3. Right ventricular systolic function is normal. The right ventricular  size is normal. There is normal pulmonary artery systolic pressure.   4. The mitral valve is normal in structure. No evidence of mitral valve  regurgitation. No evidence of mitral stenosis.   5. The aortic valve was not well visualized. Aortic valve regurgitation  is not visualized. Mild to moderate aortic valve sclerosis/calcification  is present, without any evidence of aortic stenosis.   6. The inferior vena cava is normal in size with greater than 50%  respiratory variability, suggesting right atrial pressure of 3 mmHg.  Zio monitor reviewed Normal sinus rhythm Patient had a min HR of 60 bpm, max HR of 179 bpm, and avg HR of 87 bpm.  ---1 run of Ventricular Tachycardia occurred lasting 4 beats with a max rate of 160 bpm (avg 139 bpm).  ----8 Supraventricular Tachycardia runs occurred, the run with the fastest interval lasting 15 beats with a max rate of 179 bpm (avg 146 bpm);  the run with the fastest interval was also the longest.  ---Isolated SVEs were rare (<1.0%), SVE Couplets were rare (<1.0%), and SVE Triplets were rare (<1.0%).  ---Isolated VEs were rare (<1.0%), VE Couplets were rare (<1.0%), and no VE Triplets were present. Ventricular Trigeminy was present.  EKG personally reviewed by myself on todays visit Shows NSR rate 88 bpm, no significant ST or T wave changes  Other past medical hx  history of  PTSD. 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 Has never been treated for this.     PMH:   has a past medical history of Allergy, Anxiety, Asthma, CAD, multiple vessel, COPD (chronic obstructive pulmonary disease) ([place]), Depression, Diabetes mellitus  without complication ([place]), Dyspnea, GERD (gastroesophageal reflux disease), History of chicken pox, Hyperlipidemia, Hypertension, MI (myocardial infarction) ([place]), Prostate enlargement, PTSD (post-traumatic stress disorder), and Sarcoidosis of lung ([place]).  PSH:    Past Surgical History:  Procedure Laterality Date   billary tube placement     CARDIAC CATHETERIZATION     [place], [place] N/A 06/05/2016   Procedure: LAPAROSCOPIC CHOLECYSTECTOMY with cholangiogram;  Surgeon: Jules Husbands, MD;  Location: [facility];  Service: General;  Laterality: N/A;   CORONARY ARTERY BYPASS GRAFT N/A 11/13/2020   Procedure: CORONARY ARTERY BYPASS GRAFTING (CABG) x 4 ON CARDIOPULMONARY BYPASS USING LIMA AND RIGHT GSV. -LIMA to LAD -SVG to OM -SVG to PDA -SVG to DIAGONAL;  Surgeon: Lajuana Matte, MD;  Location: [facility];  Service: Open Heart Surgery;  Laterality: N/A;   [place], 05/2012   x 2   ENDOVEIN HARVEST OF GREATER SAPHENOUS VEIN Right 11/13/2020   Procedure: ENDOVEIN HARVEST OF GREATER SAPHENOUS VEIN;  Surgeon: Lajuana Matte, MD;  Location: [facility];  Service: Open Heart Surgery;  Laterality: Right;   IR GENERIC HISTORICAL  04/30/2016   IR PERC CHOLECYSTOSTOMY 04/30/2016 [facility]INTER[facility] RAD   LEFT HEART CATH AND CORONARY ANGIOGRAPHY Left 10/06/2019   Procedure: LEFT HEART CATH AND CORONARY ANGIOGRAPHY;  Surgeon: Minna Merritts, MD;  Location: [facility];  Service: Cardiovascular;  Laterality: Left;   LEFT HEART CATH AND CORONARY ANGIOGRAPHY N/A 11/06/2020   Procedure: LEFT HEART CATH AND CORONARY ANGIOGRAPHY;  Surgeon: Nelva Bush, MD;  Location: [facility];  Service: Cardiovascular;  Laterality: N/A;   TEE WITHOUT CARDIOVERSION N/A 11/13/2020   Procedure: TRANSESOPHAGEAL ECHOCARDIOGRAM (TEE);  Surgeon: Lajuana Matte, MD;  Location: [facility];  Service: Open Heart Surgery;  Laterality: N/A;     Current Outpatient Medications  Medication Sig Dispense Refill   aspirin EC 81 MG EC tablet Take 1 tablet (81 mg total) by mouth daily. Swallow whole. 30 tablet 11   clopidogrel (PLAVIX) 75 MG tablet Take 1 tablet (75 mg total) by mouth daily. 30 tablet 11   ezetimibe (ZETIA) 10 MG tablet Take 1 tablet (10 mg total) by mouth daily.     midodrine (PROAMATINE) 2.5 MG tablet Take 1 tablet (2.5 mg total) by mouth 2 (two) times daily with a meal. 60 tablet    nitroGLYCERIN (NITROSTAT) 0.4 MG SL tablet PLACE 1 TABLET(0.4MG  TOTAL) UNDER THE TONGUE EVERY 5 MINUTES AS NEEDED FOR CHEST PAIN 25 tablet 0   pantoprazole (PROTONIX) 40 MG tablet Take 1 tablet (40 mg total) by mouth daily.     rosuvastatin (CRESTOR) 40 MG tablet Take 1 tablet (40 mg total) by mouth daily.     sertraline (ZOLOFT) 100 MG tablet Take 1 tablet (100 mg total) by mouth daily.     tamsulosin (FLOMAX) 0.4 MG CAPS capsule Take 0.4 mg by mouth daily.     No current facility-administered medications for this visit.   Facility-Administered Medications Ordered  in Other Visits  Medication Dose Route Frequency Provider Last Rate Last Admin   sodium chloride flush (NS) 0.9 % injection 3 mL  3 mL Intravenous Q12H Visser, Jacquelyn D, PA-C        Allergies:   Peanut-containing drug products   Social History:  The patient  reports that he quit smoking about 39 years ago. His smoking use included cigarettes. He has a 100.00 pack-year smoking history. He has never used smokeless tobacco. He reports that he does not drink alcohol and does not use drugs.   Family History:   family history includes Cancer in his brother, father, and mother; Hyperlipidemia in his father and mother; Hypertension in his father and mother.    Review of Systems: Review of Systems  Constitutional: Negative.   HENT: Negative.    Respiratory: Negative.    Cardiovascular: Negative.   Gastrointestinal: Negative.   Musculoskeletal:  Positive for joint pain.   Neurological: Negative.   Psychiatric/Behavioral: Negative.    All other systems reviewed and are negative.  PHYSICAL EXAM: VS:  BP 120/60 (BP Location: Left Arm, Patient Position: Sitting, Cuff Size: Normal)   Pulse 88   Ht 5\' 11"  (1.803 m)   Wt 159 lb 4 oz (72.2 kg)   SpO2 98%   BMI 22.21 kg/m  , BMI Body mass index is 22.21 kg/m. Constitutional:  oriented to person, place, and time. No distress.  HENT:  Head: Grossly normal Eyes:  no discharge. No scleral icterus.  Neck: No JVD, no carotid bruits  Cardiovascular: Regular rate and rhythm, no murmurs appreciated Pulmonary/Chest: Clear to auscultation bilaterally, no wheezes or rails Abdominal: Soft.  no distension.  no tenderness.  Musculoskeletal: Normal range of motion Neurological:  normal muscle tone. Coordination normal. No atrophy Skin: Skin warm and dry Psychiatric: normal affect, pleasant  Recent Labs: 12/04/2020: ALT 33; B Natriuretic Peptide 163.5; TSH 1.255 12/07/2020: BUN 16; Creatinine, Ser 0.74; Magnesium 2.0; Potassium 4.2; Sodium 138 12/24/2020: Hemoglobin 14.0; Platelets 276   Lipid Panel Lab Results  Component Value Date   CHOL 132 11/06/2020   HDL 36 (L) 11/06/2020   LDLCALC 67 11/06/2020   TRIG 143 11/06/2020      Wt Readings from Last 3 Encounters:  01/22/21 159 lb 4 oz (72.2 kg)  01/16/21 161 lb (73 kg)  12/24/20 163 lb (73.9 kg)     ASSESSMENT AND PLAN:  CAD, multiple vessel   Stay on crestor and zetia for goal LDL <70 Cholesterol at goal, numbers trending lower with weight loss  Pure hypercholesterolemia -  Crestor, zetia, continue for now  Essential hypertension -  Dramatic weight loss in the past several months leading to low blood pressure, on midodrine Recommend he try to stabilize his weight  Claudication in peripheral vascular disease ([place]) -  lower extremity Doppler with minimal disease on the right Minimal carotid disease  H/O burning pain in leg  neuropathy ,  previous lower extremity arterial Doppler was essentially normal  PTSD (post-traumatic stress disorder) stable  Type 2 diabetes, HbA1c goal < 7% (HCC) Managed through primary care  Alcohol abuse Previous history of alcohol abuse.  Cessation recommended   Total encounter time more than 25 minutes  Greater than 50% was spent in counseling and coordination of care with the patient   Orders Placed This Encounter  Procedures   EKG 12-Lead      Signed, Esmond Plants, M.D., Ph.D. 01/22/2021  [place], [place]

## 2021-01-22 ENCOUNTER — Other Ambulatory Visit: Payer: Self-pay

## 2021-01-22 ENCOUNTER — Ambulatory Visit (INDEPENDENT_AMBULATORY_CARE_PROVIDER_SITE_OTHER): Payer: Medicare Other | Admitting: Cardiovascular Disease

## 2021-01-22 ENCOUNTER — Encounter: Payer: Self-pay | Admitting: Cardiovascular Disease

## 2021-01-22 VITALS — BP 120/60 | HR 88 | Ht 71.0 in | Wt 159.2 lb

## 2021-01-22 DIAGNOSIS — R55 Syncope and collapse: Secondary | ICD-10-CM

## 2021-01-22 DIAGNOSIS — Z87891 Personal history of nicotine dependence: Secondary | ICD-10-CM

## 2021-01-22 DIAGNOSIS — I2584 Coronary atherosclerosis due to calcified coronary lesion: Secondary | ICD-10-CM

## 2021-01-22 DIAGNOSIS — F4310 Post-traumatic stress disorder, unspecified: Secondary | ICD-10-CM

## 2021-01-22 DIAGNOSIS — F1011 Alcohol abuse, in remission: Secondary | ICD-10-CM

## 2021-01-22 DIAGNOSIS — R9431 Abnormal electrocardiogram [ECG] [EKG]: Secondary | ICD-10-CM

## 2021-01-22 DIAGNOSIS — I2510 Atherosclerotic heart disease of native coronary artery without angina pectoris: Secondary | ICD-10-CM | POA: Diagnosis not present

## 2021-01-22 DIAGNOSIS — I10 Essential (primary) hypertension: Secondary | ICD-10-CM | POA: Diagnosis not present

## 2021-01-22 DIAGNOSIS — E7800 Pure hypercholesterolemia, unspecified: Secondary | ICD-10-CM | POA: Diagnosis not present

## 2021-01-22 DIAGNOSIS — E118 Type 2 diabetes mellitus with unspecified complications: Secondary | ICD-10-CM

## 2021-01-22 DIAGNOSIS — E1142 Type 2 diabetes mellitus with diabetic polyneuropathy: Secondary | ICD-10-CM

## 2021-01-22 DIAGNOSIS — E119 Type 2 diabetes mellitus without complications: Secondary | ICD-10-CM

## 2021-01-22 DIAGNOSIS — E785 Hyperlipidemia, unspecified: Secondary | ICD-10-CM

## 2021-01-22 DIAGNOSIS — I951 Orthostatic hypotension: Secondary | ICD-10-CM

## 2021-01-22 DIAGNOSIS — I739 Peripheral vascular disease, unspecified: Secondary | ICD-10-CM

## 2021-01-22 DIAGNOSIS — Z951 Presence of aortocoronary bypass graft: Secondary | ICD-10-CM

## 2021-01-22 NOTE — Patient Instructions (Signed)
Medication Instructions:  No changes  If you need a refill on your cardiac medications before your next appointment, please call your pharmacy.   Lab work: No new labs needed   If you have labs (blood work) drawn today and your tests are completely normal, you will receive your results only by: [place] (if you have MyChart) OR A paper copy in the mail If you have any lab test that is abnormal or we need to change your treatment, we will call you to review the results.   Testing/Procedures: No new testing needed   Follow-Up: At [facility], you and your health needs are our priority.  As part of our continuing mission to provide you with exceptional heart care, we have created designated Provider Care Teams.  These Care Teams include your primary Cardiologist (physician) and Advanced Practice Providers (APPs -  Physician Assistants and Nurse Practitioners) who all work together to provide you with the care you need, when you need it.  You will need a follow up appointment in 6 months  Providers on your designated Care Team:   Murray Hodgkins, NP Christell Faith, PA-C Marrianne Mood, PA-C Cadence Kathlen Mody, [place]  Any Other Special Instructions Will Be Listed Below (If Applicable).  COVID-19 Vaccine Information can be found at: ShippingScam.co.uk For questions related to vaccine distribution or appointments, please email [email or web address] or call [phone].

## 2021-01-23 NOTE — Congregational Nurse Program (Signed)
°  Dept: [facility] Nurse Program Note  Date of Encounter: 01/23/2021 Client to clinic for vital sign check and support. He has had all his follow up appointments with Cardiology and his PCP. Midodrine has been decreased to 2.5 mg daily. He reports still feeling some what "weaker' and has a new tremor to this left hand which his Md is aware of. Client does plan to come weekly for BP checks. No upcoming appointments at this time. BP 124/68 (BP Location: Left Arm, Patient Position: Sitting, Cuff Size: Normal)    Pulse 99    SpO2 99% Comment: room air   Past Medical History: Past Medical History:  Diagnosis Date   Allergy    Anxiety    Asthma    CAD, multiple vessel    COPD (chronic obstructive pulmonary disease) (HCC)    Depression    Diabetes mellitus without complication (HCC)    Dyspnea    GERD (gastroesophageal reflux disease)    History of chicken pox    Hyperlipidemia    Hypertension    MI (myocardial infarction) ([place])    Prostate enlargement    PTSD (post-traumatic stress disorder)    [place] Vet   Sarcoidosis of lung ([place])     Encounter Details:  CNP Questionnaire - 01/23/21 1330       Questionnaire   Do you give verbal consent to treat you today? Yes    Location Patient [facility]    Visit Setting Other;Church or Organization    Patient Status Unknown   n/a   Insurance Medicare    Insurance Referral N/A    Medication N/A    Medical Provider Yes    Screening Referrals N/A    Medical Referral N/A    Medical Appointment Made N/A    Food N/A   patient currenlty does not have transportation to the grocery store   Transportation N/A    Housing/Utilities N/A    Interpersonal Safety N/A    Intervention Blood pressure;Support    ED Visit Averted N/A    Life-Saving Intervention Made N/A

## 2021-01-30 NOTE — Congregational Nurse Program (Signed)
°  Dept: [phone]   Congregational Nurse Program Note  Date of Encounter: 01/30/2021 BP 116/70 (BP Location: Left Arm, Patient Position: Sitting, Cuff Size: Normal)    Pulse (!) 110    SpO2 98%  Client to clinic for weekly vital sign check and support. No new medication changes or Md visits since last clinic visit.    Past Medical History: Past Medical History:  Diagnosis Date   Allergy    Anxiety    Asthma    CAD, multiple vessel    COPD (chronic obstructive pulmonary disease) ([place])    Depression    Diabetes mellitus without complication (HCC)    Dyspnea    GERD (gastroesophageal reflux disease)    History of chicken pox    Hyperlipidemia    Hypertension    MI (myocardial infarction) ([place])    Prostate enlargement    PTSD (post-traumatic stress disorder)    [place] Vet   Sarcoidosis of lung ([place])     Encounter Details:  CNP Questionnaire - 01/30/21 1250       Questionnaire   Do you give verbal consent to treat you today? Yes    Location Patient [facility] or Organization    Patient Status Unknown   n/a   Insurance Medicare    Insurance Referral N/A    Medication N/A    Medical Provider Yes    Screening Referrals N/A    Medical Referral N/A    Medical Appointment Made N/A    Food N/A   patient currenlty does not have transportation to the grocery store   Transportation N/A    Housing/Utilities N/A    Interpersonal Safety N/A    Intervention Blood pressure;Support    ED Visit Averted N/A    Life-Saving Intervention Made N/A

## 2021-01-31 ENCOUNTER — Encounter: Payer: Self-pay | Admitting: Internal Medicine

## 2021-02-20 NOTE — Congregational Nurse Program (Signed)
°  Dept: [phone]   Congregational Nurse Program Note  Date of Encounter: 02/20/2021 Client to clinic for vital sign check and conversation/support.  BP 130/80 (BP Location: Left Arm, Patient Position: Sitting, Cuff Size: Normal)    Pulse 77    SpO2 97% Comment: room air RN assisted client with measuring for a new belt and pants. He has lost 30+ pounds since his heart surgery in October and none of his clothes fit. Client appreciative of assistance and will plan a shopping trip when he gets his check. No new medications or medical concerns since last visit. Past Medical History: Past Medical History:  Diagnosis Date   Allergy    Anxiety    Asthma    CAD, multiple vessel    COPD (chronic obstructive pulmonary disease) ([place])    Depression    Diabetes mellitus without complication (HCC)    Dyspnea    GERD (gastroesophageal reflux disease)    History of chicken pox    Hyperlipidemia    Hypertension    MI (myocardial infarction) ([place])    Prostate enlargement    PTSD (post-traumatic stress disorder)    [place] Vet   Sarcoidosis of lung ([place])     Encounter Details:  CNP Questionnaire - 02/20/21 1030       Questionnaire   Do you give verbal consent to treat you today? Yes    Location Patient [facility] or Organization    Patient Status Unknown   n/a   Insurance Medicare    Insurance Referral N/A    Medication N/A    Medical Provider Yes    Screening Referrals N/A    Medical Referral N/A    Medical Appointment Made N/A    Food N/A   patient currenlty does not have transportation to the grocery store   Transportation N/A   client uses [facility] or taxi,   Housing/Utilities N/A    Interpersonal Safety N/A    Intervention Blood pressure;Support    ED Visit Averted N/A    Life-Saving Intervention Made N/A

## 2021-03-06 ENCOUNTER — Other Ambulatory Visit (HOSPITAL_COMMUNITY): Payer: Self-pay

## 2021-03-20 DIAGNOSIS — E1142 Type 2 diabetes mellitus with diabetic polyneuropathy: Secondary | ICD-10-CM

## 2021-03-20 LAB — GLUCOSE, POCT (MANUAL RESULT ENTRY): POC Glucose: 177 mg/dl — AB (ref 70–99)

## 2021-03-20 NOTE — Congregational Nurse Program (Signed)
°  Dept: [facility] Nurse Program Note  Date of Encounter: 03/20/2021   Resident came for health screening - BP 124/68, pulse 83, O2 sat 98%, blood sugar 177 (1 hour after breakfast) Discussed appoint schedule with cardiologist, reviewed current medications and nutrition to maintain blood glucose within range. Gave information on the [employer] free over the counter medication giveaway on Saturday, February 11th.   Past Medical History: Past Medical History:  Diagnosis Date   Allergy    Anxiety    Asthma    CAD, multiple vessel    COPD (chronic obstructive pulmonary disease) ([place])    Depression    Diabetes mellitus without complication (HCC)    Dyspnea    GERD (gastroesophageal reflux disease)    History of chicken pox    Hyperlipidemia    Hypertension    MI (myocardial infarction) ([place])    Prostate enlargement    PTSD (post-traumatic stress disorder)    [place] Vet   Sarcoidosis of lung ([place])     Encounter Details:

## 2021-04-03 NOTE — Congregational Nurse Program (Signed)
°  Dept: [facility] Nurse Program Note  Date of Encounter: 04/03/2021  Resident came to clinic for Blood Pressure check, BP 132/80, pulse 85, O2 sat 99%.  No complaints of chest pain or shortness of breath.  Complains of gastric reflux, takes Protonix, reviewed medication side effects and actions.  Suggested he discuss medications at MD visit scheduled for early March, call MD if other GI symptoms occur or symptoms worsen before next visit to MD.    Past Medical History: Past Medical History:  Diagnosis Date   Allergy    Anxiety    Asthma    CAD, multiple vessel    COPD (chronic obstructive pulmonary disease) ([place])    Depression    Diabetes mellitus without complication (HCC)    Dyspnea    GERD (gastroesophageal reflux disease)    History of chicken pox    Hyperlipidemia    Hypertension    MI (myocardial infarction) ([place])    Prostate enlargement    PTSD (post-traumatic stress disorder)    [place] Vet   Sarcoidosis of lung ([place])     Encounter Details:

## 2021-04-18 ENCOUNTER — Telehealth: Payer: Self-pay

## 2021-04-18 NOTE — Telephone Encounter (Signed)
Called patient to schedule apt for constipation patient went on about all the copays that's what everyone wants and our medical system is incompetent patient refused appointment until we get someone to hold our hands and his pcp doctors assistant dont know english  ?

## 2021-05-01 NOTE — Congregational Nurse Program (Signed)
?  Dept: [phone] ? ? ?Congregational Nurse Program Note ? ?Date of Encounter: 05/01/2021 ? ?Visit for BP monitoring, BP 134/78, pulse 88 and regular, O2 Sat 100%.  Has concerns re obtaining scheduling for a colonoscopy.  Is to let CNP nurse know if procedure doesn't get scheduled and he needs assistance. ?Past Medical History: ?Past Medical History:  ?Diagnosis Date  ? Allergy   ? Anxiety   ? Asthma   ? CAD, multiple vessel   ? COPD (chronic obstructive pulmonary disease) ([place])   ? Depression   ? Diabetes mellitus without complication ([place])   ? Dyspnea   ? GERD (gastroesophageal reflux disease)   ? History of chicken pox   ? Hyperlipidemia   ? Hypertension   ? MI (myocardial infarction) ([place])   ? Prostate enlargement   ? PTSD (post-traumatic stress disorder)   ? [place] Vet  ? Sarcoidosis of lung ([place])   ? ? ?Encounter Details: ? CNP Questionnaire - 05/01/21 1153   ? ?  ? Questionnaire  ? Do you give verbal consent to treat you today? Yes   ? Location Patient Served  [place]   ? Visit Setting Church or Organization   ? Patient Status Unknown   Has own apt at [place]  ? Insurance Unknown   ? Insurance Referral Medicaid;Medicare   ? Medication N/A   ? Medical Provider Yes   ? Screening Referrals N/A   ? Medical Referral N/A   ? Medical Appointment Made N/A   ? Food N/A   ? Transportation N/A   ? Housing/Utilities N/A   ? Interpersonal Safety N/A   ? Intervention Blood pressure;Support;Counsel   ? ED Visit Averted N/A   ? Life-Saving Intervention Made N/A   ? ?  ?  ? ?  ? ? ? ? ?

## 2021-05-08 NOTE — Congregational Nurse Program (Signed)
?  Dept: [phone] ? ? ?Congregational Nurse Program Note ? ?Date of Encounter: 05/08/2021 ? ?Clinic visit for BP and weight.  Blood pressure is 118/76, lower than usual, pulse 90, O2 sat 99%.  Expressed concern over weight loss and difficulty getting colonoscopy scheduled.  Has lost 16 lbs since November 2022, 20 lb weight loss since cardiovascular surgery in late September 2022 and PCP recommended colonoscopy.  States he sent in needed paper work late last week. Offered to call PCP office at next week's clinic if he has not received a response. ? ?Past Medical History: ?Past Medical History:  ?Diagnosis Date  ? Allergy   ? Anxiety   ? Asthma   ? CAD, multiple vessel   ? COPD (chronic obstructive pulmonary disease) ([place])   ? Depression   ? Diabetes mellitus without complication ([place])   ? Dyspnea   ? GERD (gastroesophageal reflux disease)   ? History of chicken pox   ? Hyperlipidemia   ? Hypertension   ? MI (myocardial infarction) ([place])   ? Prostate enlargement   ? PTSD (post-traumatic stress disorder)   ? [place] Vet  ? Sarcoidosis of lung ([place])   ? ? ?Encounter Details: ? ? ? ? ?

## 2021-05-15 NOTE — Congregational Nurse Program (Signed)
?  Dept: [phone] ? ? ?Congregational Nurse Program Note ? ?Date of Encounter: 05/15/2021 ? ?Clinic visit for blood pressure and weight checks, BP 120/80, pulse 86, weight 149 lbs.  Has not lost any more weight in past week but 16 lb weight loss since Nov 2022. In addition to unexplained weight loss now has excessive flatulence without any change in diet and constipation for the past week. Continues to wait on referral appointment for colonoscopy recommended by primary physician, did not want assistance for obtaining appointment. ?Past Medical History: ?Past Medical History:  ?Diagnosis Date  ? Allergy   ? Anxiety   ? Asthma   ? CAD, multiple vessel   ? COPD (chronic obstructive pulmonary disease) ([place])   ? Depression   ? Diabetes mellitus without complication ([place])   ? Dyspnea   ? GERD (gastroesophageal reflux disease)   ? History of chicken pox   ? Hyperlipidemia   ? Hypertension   ? MI (myocardial infarction) ([place])   ? Prostate enlargement   ? PTSD (post-traumatic stress disorder)   ? [place] Vet  ? Sarcoidosis of lung ([place])   ? ? ?Encounter Details: ? CNP Questionnaire - 05/15/21 1005   ? ?  ? Questionnaire  ? Do you give verbal consent to treat you today? Yes   ? Location Patient Served  [place]   ? Visit Setting Church or Organization   ? Patient Status Unknown   ? Insurance Medicaid;Medicare   ? Insurance Referral N/A   ? Medication N/A   ? Medical Provider Yes   ? Screening Referrals N/A   ? Medical Referral N/A   ? Medical Appointment Made N/A   ? Food N/A   ? Transportation N/A   ? Housing/Utilities N/A   ? Interpersonal Safety N/A   ? Intervention Blood pressure;Educate;Support   ? ED Visit Averted N/A   ? Life-Saving Intervention Made N/A   ? ?  ?  ? ?  ? ? ? ? ?

## 2021-05-22 NOTE — Congregational Nurse Program (Signed)
?  Dept: [phone] ? ? ?Congregational Nurse Program Note ? ?Date of Encounter: 05/22/2021 ? ?Visit for blood pressure and weight check, BP 128/80, pulse 84 and regular.  Weight is stable at 149 lbs, has referral for colonoscopy.  Less complaints of excessive flatulence today and has not had any other GI symptoms since last weeks visit. ?Past Medical History: ?Past Medical History:  ?Diagnosis Date  ? Allergy   ? Anxiety   ? Asthma   ? CAD, multiple vessel   ? COPD (chronic obstructive pulmonary disease) ([place])   ? Depression   ? Diabetes mellitus without complication ([place])   ? Dyspnea   ? GERD (gastroesophageal reflux disease)   ? History of chicken pox   ? Hyperlipidemia   ? Hypertension   ? MI (myocardial infarction) ([place])   ? Prostate enlargement   ? PTSD (post-traumatic stress disorder)   ? [place] Vet  ? Sarcoidosis of lung ([place])   ? ? ?Encounter Details: ? ? ? ? ?

## 2021-06-05 NOTE — Congregational Nurse Program (Signed)
?  Dept: [phone] ? ? ?Congregational Nurse Program Note ? ?Date of Encounter: 06/05/2021 ? ? ?Clinic visit for blood pressure and weight checks. BP 130/74, pulse 74 and slightly irregular, denies any recent episode of chest pain.  Has changed primary care to a [facility] located at [facility].  Used Safe program transportation for initial visit but unsure if it can be used for next appointment on 06/28/2021.  Will find out before next clinic visit and look for options. ? ?Colonoscopy to be delayed per new PCP until he has CT of abdomen and PCP reviews results.  Weight is 150 Lbs, no further weight loss since first of March, no complains today of the excessive flatulence. ? ?Complains of left shoulder pain today and reports he has been told of some degenerative disease in the joint but unsure of type.  Will call MD office if pain worsens or any change in sensation. Does not take any medication for pain.  Has a 2 CM greenish-blue bruise left forearm and doesn't remember how he got it. ?Past Medical History: ?Past Medical History:  ?Diagnosis Date  ? Allergy   ? Anxiety   ? Asthma   ? CAD, multiple vessel   ? COPD (chronic obstructive pulmonary disease) ([place])   ? Depression   ? Diabetes mellitus without complication ([place])   ? Dyspnea   ? GERD (gastroesophageal reflux disease)   ? History of chicken pox   ? Hyperlipidemia   ? Hypertension   ? MI (myocardial infarction) ([place])   ? Prostate enlargement   ? PTSD (post-traumatic stress disorder)   ? [place] Vet  ? Sarcoidosis of lung ([place])   ? ? ?Encounter Details: ? CNP Questionnaire - 06/05/21 1005   ? ?  ? Questionnaire  ? Do you give verbal consent to treat you today? Yes   ? Location Patient Served  [place]   ? Visit Setting Church or Organization   ? Patient Status Unknown   ? Insurance Medicaid;Medicare   ? Insurance Referral N/A   ? Medication N/A   ? Medical Provider Yes   ? Screening Referrals N/A   ? Medical Referral N/A   ? Medical  Appointment Made N/A   ? Food N/A   ? Transportation Need transportation assistance   ? Housing/Utilities N/A   ? Interpersonal Safety N/A   ? Intervention Blood pressure;Support   ? ED Visit Averted N/A   ? Life-Saving Intervention Made N/A   ? ?  ?  ? ?  ? ? ? ? ?

## 2021-06-12 NOTE — Congregational Nurse Program (Signed)
?  Dept: [phone] ? ? ?Congregational Nurse Program Note ? ?Date of Encounter: 06/12/2021 ? ? ?Clinic visit for blood pressure check, BP 132/78.  Pulse 90 and slightly irregular, denies chest pain but had shortness of breath from walking to the clinic from his apartment that is on a hill. States he does not having shortness of breath walking on flat surfaces r in his apartment. ? ?Complaining of Lt knee pain and that he stumbled in apartment and hit Lt 4th toe on furniture.  Lt 4th toe, red but no swelling or loss of sensation, circulation WNL.  Has been previously tod that he has degenerative disease Lt knee.  Is not taking any medication for pain but states will take med if pain worsens. ? ?States he never received response from his Medicaid application.  Assisted him with finding online site to find application status. He was able to call case worker from online information and will be approved for payment of his Medicare payment as he is not eligible for full Medicaid d/t retirement income amount. ?Past Medical History: ?Past Medical History:  ?Diagnosis Date  ? Allergy   ? Anxiety   ? Asthma   ? CAD, multiple vessel   ? COPD (chronic obstructive pulmonary disease) ([place])   ? Depression   ? Diabetes mellitus without complication ([place])   ? Dyspnea   ? GERD (gastroesophageal reflux disease)   ? History of chicken pox   ? Hyperlipidemia   ? Hypertension   ? MI (myocardial infarction) ([place])   ? Prostate enlargement   ? PTSD (post-traumatic stress disorder)   ? [place] Vet  ? Sarcoidosis of lung ([place])   ? ? ?Encounter Details: ? CNP Questionnaire - 06/12/21 0940   ? ?  ? Questionnaire  ? Do you give verbal consent to treat you today? Yes   ? Location Patient Served  [place]   ? Visit Setting Church or Organization   ? Patient Status Unknown   ? Insurance Medicare   ? Insurance Referral Medicaid   ? Medication N/A   ? Medical Provider Yes   ? Screening Referrals N/A   ? Medical Referral N/A   ? Medical  Appointment Made N/A   ? Food N/A   ? Transportation N/A   ? Housing/Utilities N/A   ? Interpersonal Safety N/A   ? Intervention Blood pressure;Support;Counsel;Navigate Healthcare System   ? ED Visit Averted N/A   ? Life-Saving Intervention Made N/A   ? ?  ?  ? ?  ? ? ? ? ?

## 2021-06-19 NOTE — Congregational Nurse Program (Signed)
?  Dept: [phone] ? ? ?Congregational Nurse Program Note ? ?Date of Encounter: 06/19/2021 ? ? ?Clinic visit for blood pressure and weight check.  BP 130/64, pulse 66 and regular, O2 Sat 98%.  Weight 150 lbs, no weight loss since March but continues to have GI complaints.  To see different gastroenterologist from change to [facility] system clinic physicians.  ? ?States Lt shoulder pain is constant with popping of joint upon certain movements.  No complaint of knee pain today. Will discuss at PCP visit on 5/23, takes Tylenol which is effective. ?Past Medical History: ?Past Medical History:  ?Diagnosis Date  ? Allergy   ? Anxiety   ? Asthma   ? CAD, multiple vessel   ? COPD (chronic obstructive pulmonary disease) ([place])   ? Depression   ? Diabetes mellitus without complication ([place])   ? Dyspnea   ? GERD (gastroesophageal reflux disease)   ? History of chicken pox   ? Hyperlipidemia   ? Hypertension   ? MI (myocardial infarction) ([place])   ? Prostate enlargement   ? PTSD (post-traumatic stress disorder)   ? [place] Vet  ? Sarcoidosis of lung ([place])   ? ? ?Encounter Details: ? CNP Questionnaire - 06/19/21 0940   ? ?  ? Questionnaire  ? Do you give verbal consent to treat you today? Yes   ? Location Patient Served  [place]   ? Visit Setting Church or Organization   ? Patient Status Unknown   ? Insurance Medicare   Medicaid payment of medicare part B premium  ? Insurance Referral N/A   ? Medication N/A   ? Medical Provider Yes   ? Screening Referrals N/A   ? Medical Referral N/A   ? Medical Appointment Made N/A   ? Food N/A   ? Transportation N/A   ? Housing/Utilities N/A   ? Interpersonal Safety N/A   ? Intervention Blood pressure;Support;Navigate Healthcare System   ? ED Visit Averted N/A   ? Life-Saving Intervention Made N/A   ? ?  ?  ? ?  ? ? ? ? ?

## 2021-06-26 NOTE — Congregational Nurse Program (Signed)
?  Dept: [phone] ? ? ?Congregational Nurse Program Note ? ?Date of Encounter: 06/26/2021 ? ?Clinic visit for blood pressure and blood sugar check.  BP 130/68, pulse 68 and regular, O2 Sat 98%. Weight is increase slightly to 154 Lbs, no further weight loss.  Is to see gastroenterologist on Friday for unexplained weight loss, increased flatulence and indigestion.   ?Past Medical History: ?Past Medical History:  ?Diagnosis Date  ? Allergy   ? Anxiety   ? Asthma   ? CAD, multiple vessel   ? COPD (chronic obstructive pulmonary disease) ([place])   ? Depression   ? Diabetes mellitus without complication ([place])   ? Dyspnea   ? GERD (gastroesophageal reflux disease)   ? History of chicken pox   ? Hyperlipidemia   ? Hypertension   ? MI (myocardial infarction) ([place])   ? Prostate enlargement   ? PTSD (post-traumatic stress disorder)   ? [place] Vet  ? Sarcoidosis of lung ([place])   ? ? ?Encounter Details: ? ? ? ? ?

## 2021-07-01 DIAGNOSIS — R7303 Prediabetes: Secondary | ICD-10-CM | POA: Insufficient documentation

## 2021-07-03 NOTE — Congregational Nurse Program (Signed)
  Dept: [facility] [facility] Nurse Program Note  Date of Encounter: 07/03/2021   Clinic visit for blood pressure and weight, BP 134/76, pulse 80 and regular.  Weight 156 Lbs, no further weight loss, saw gastroenterologist on 5/19 and is waiting for the colonoscopy appointment, no complains today of excessive flatulence.  Complaining of Lt knee and Lt shoulder pain, states knee pain is constant but not severe.  Educated resident on use of over the counter medications that they must not be used without contacting his MD.  Reviewed symptoms that should be reported to MD immediately.  Has a dark green healing bruise on left knee approximately 3 cm in diameter, states he hit knee on chair.  Does not have any redness or swelling of knee or bruised area. Past Medical History: Past Medical History:  Diagnosis Date   Allergy    Anxiety    Asthma    CAD, multiple vessel    COPD (chronic obstructive pulmonary disease) ([place])    Depression    Diabetes mellitus without complication (HCC)    Dyspnea    GERD (gastroesophageal reflux disease)    History of chicken pox    Hyperlipidemia    Hypertension    MI (myocardial infarction) ([place])    Prostate enlargement    PTSD (post-traumatic stress disorder)    [place] Vet   Sarcoidosis of lung ([place])     Encounter Details:  CNP Questionnaire - 07/03/21 1000       Questionnaire   Do you give verbal consent to treat you today? Yes    Location Patient [place] or Organization    Patient Status Unknown    Insurance Medicare    Insurance Referral N/A    Medication N/A    Medical Provider Yes    Screening Referrals N/A    Medical Referral N/A    Medical Appointment Made N/A    Food N/A    Transportation N/A    Housing/Utilities N/A    Interpersonal Safety N/A    Intervention Blood pressure;Navigate Healthcare System;Educate;Counsel;Support    ED Visit Averted N/A    Life-Saving Intervention  Made N/A

## 2021-07-09 ENCOUNTER — Telehealth: Payer: Self-pay | Admitting: Cardiovascular Disease

## 2021-07-09 MED ORDER — NITROGLYCERIN 0.4 MG SL SUBL: SUBLINGUAL_TABLET | SUBLINGUAL | 0 refills | Status: DC

## 2021-07-09 NOTE — Telephone Encounter (Signed)
*  STAT* If patient is at the pharmacy, call can be transferred to refill team.   1. Which medications need to be refilled? (please list name of each medication and dose if known) Nitroglycerin  2. Which pharmacy/location (including street and city if local pharmacy) is medication to be sent to?[facility]  3. Do they need a 30 day or 90 day supply?

## 2021-07-09 NOTE — Telephone Encounter (Signed)
Requested Prescriptions   Signed Prescriptions Disp Refills   nitroGLYCERIN (NITROSTAT) 0.4 MG SL tablet 25 tablet 0    Sig: PLACE 1 TABLET(0.'4MG'$  TOTAL) UNDER THE TONGUE EVERY 5 MINUTES AS NEEDED FOR CHEST PAIN    Authorizing Provider: Minna Merritts    Ordering User: Raelene Bott, Ludie Pavlik L

## 2021-07-10 NOTE — Congregational Nurse Program (Signed)
  Dept: [facility] Nurse Program Note  Date of Encounter: 07/10/2021   Clinic visit for blood pressure and weight checks, BP 118/72, pulse 88 and regular.  States he ran out of Plavix when he filled pillbox on May 20th and didn't call for refill until this week.  Educated him on need to take medication because of cardiovascular surgery and prompted him to call cardiologist office to inform that he had been off Plavix about 11 days.  Left phone number for call back from office while here at clinic.  Complaining of left shoulder pain and left hip pain.  States he doesn't take medication on a regular basis but can take Tylenol.  Discussed non-medication methods to relieve pain such as using heat or ice.  Past Medical History: Past Medical History:  Diagnosis Date   Allergy    Anxiety    Asthma    CAD, multiple vessel    COPD (chronic obstructive pulmonary disease) ([place])    Depression    Diabetes mellitus without complication (HCC)    Dyspnea    GERD (gastroesophageal reflux disease)    History of chicken pox    Hyperlipidemia    Hypertension    MI (myocardial infarction) ([name])    Prostate enlargement    PTSD (post-traumatic stress disorder)    [place] Vet   Sarcoidosis of lung ([name])     Encounter Details:

## 2021-07-17 NOTE — Congregational Nurse Program (Signed)
  Dept: [facility] Nurse Program Note  Date of Encounter: 07/17/2021   Visit for blood pressure check, BP 134/70, 84 pulse regular. No complaints of pain but has greenish bruises on both legs.  States he bumps into furniture, reinforced using caution due to taking the blood thinner.  Discussed visit to cardiologist on next week and need to let cardiologist know that he was off the Plavix from approximately 5/20 through 6/4 because of not ordering refill in time.  Wearing sun glasses, eyes dilated today from ophthalmology visit. Is to see gastroenterologist for a decision on a colonoscopy after cardiology visit. Past Medical History: Past Medical History:  Diagnosis Date   Allergy    Anxiety    Asthma    CAD, multiple vessel    COPD (chronic obstructive pulmonary disease) ([place])    Depression    Diabetes mellitus without complication (HCC)    Dyspnea    GERD (gastroesophageal reflux disease)    History of chicken pox    Hyperlipidemia    Hypertension    MI (myocardial infarction) ([place])    Prostate enlargement    PTSD (post-traumatic stress disorder)    [place] Vet   Sarcoidosis of lung ([place])     Encounter Details:

## 2021-07-22 NOTE — Progress Notes (Signed)
Cardiology Office Note  Alcohol abuse Date:  07/23/2021   ID:  Ricky Abbott, DOB [birth year], MRN [id number]  PCP:  Sue Lush, PA-C   Chief Complaint  Patient presents with   6 month follow up     Patient c/o chest discomfort and not being able to take a deep breath at times. Medications reviewed by the patient verbally.       HPI:  76 yo caucasian male with  Coronary disease, hx of CABG October 2022 diabetes type 2,  PTSD,   coronary artery disease with stent placed 20 years ago in [place],  [place] in April 2014 [facility] with [facility] placed at that time  smoking, quit in 1984,  alcohol, depression, Long history of drinking 40 ounces beers Previous insurance and financial issues affecting his follow-up in clinic and medication compliance.   Previously in [place] Presents for routine follow-up for chest pain, coronary artery disease  Last seen by myself in clinic December 2022 In follow-up today feels he is doing well Rare palpitations, not sustained Has some tenderness left lower ribs, etiology unclear, hurts to push in that area Concerned about post CABG healing, mediastinum feels lumpy from the middle sutures, nontender  Active, does not drive, does his own laundry has to push a cart to the laundromat No regular exercise program Denies any chest pain on exertion, no shortness of breath, no leg swelling no near-syncope Has continued to take midodrine 2.5 twice a day No side effects from his medications Continues on Crestor and Zetia Has had further weight loss 45 pounds since last clinic visit  EKG personally reviewed by myself on todays visit Normal sinus rhythm with rate 72 bpm right bundle branch block no significant ST-T wave changes  s/p CABG 11/13/2020, CABG X 4.  LIMA to LAD, reverse saphenous vein graft to PDA, reverse saphenous vein graft to OM1, reverse saphenous vein graft to the first diagonal. Endoscopic greater saphenous vein harvest on the  right  Hospital recovery post-CABG was complicated by hypotension, improved on midodrine, and overall physical deconditioning. He declined SNF, and was discharged home with home health PT on 11/20/2020.   Echo 10/22   2. Left ventricular ejection fraction, by estimation, is 60 to 65%. The  left ventricle has normal function. The left ventricle has no regional  wall motion abnormalities. Left ventricular diastolic parameters are  consistent with Grade I diastolic  dysfunction (impaired relaxation).   3. Right ventricular systolic function is normal. The right ventricular  size is normal. There is normal pulmonary artery systolic pressure.   4. The mitral valve is normal in structure. No evidence of mitral valve  regurgitation. No evidence of mitral stenosis.   5. The aortic valve was not well visualized. Aortic valve regurgitation  is not visualized. Mild to moderate aortic valve sclerosis/calcification  is present, without any evidence of aortic stenosis.   6. The inferior vena cava is normal in size with greater than 50%  respiratory variability, suggesting right atrial pressure of 3 mmHg.  Zio monitor reviewed Normal sinus rhythm Patient had a min HR of 60 bpm, max HR of 179 bpm, and avg HR of 87 bpm.  ---1 run of Ventricular Tachycardia occurred lasting 4 beats with a max rate of 160 bpm (avg 139 bpm).  ----8 Supraventricular Tachycardia runs occurred, the run with the fastest interval lasting 15 beats with a max rate of 179 bpm (avg 146 bpm);  the run with the fastest interval  was also the longest.  ---Isolated SVEs were rare (<1.0%), SVE Couplets were rare (<1.0%), and SVE Triplets were rare (<1.0%).  ---Isolated VEs were rare (<1.0%), VE Couplets were rare (<1.0%), and no VE Triplets were present. Ventricular Trigeminy was present.    Other past medical hx  history of  PTSD. 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 Has never been treated for this.     PMH:   has a past medical history  of Allergy, Anxiety, Asthma, CAD, multiple vessel, COPD (chronic obstructive pulmonary disease) ([place]), Depression, Diabetes mellitus without complication ([place]), Dyspnea, GERD (gastroesophageal reflux disease), History of chicken pox, Hyperlipidemia, Hypertension, MI (myocardial infarction) ([place]), Prostate enlargement, PTSD (post-traumatic stress disorder), and Sarcoidosis of lung ([place]).  PSH:    Past Surgical History:  Procedure Laterality Date   billary tube placement     CARDIAC CATHETERIZATION     [place], [place] N/A 06/05/2016   Procedure: LAPAROSCOPIC CHOLECYSTECTOMY with cholangiogram;  Surgeon: Jules Husbands, MD;  Location: [facility];  Service: General;  Laterality: N/A;   CORONARY ARTERY BYPASS GRAFT N/A 11/13/2020   Procedure: CORONARY ARTERY BYPASS GRAFTING (CABG) x 4 ON CARDIOPULMONARY BYPASS USING LIMA AND RIGHT GSV. -LIMA to LAD -SVG to OM -SVG to PDA -SVG to DIAGONAL;  Surgeon: Lajuana Matte, MD;  Location: [place];  Service: Open Heart Surgery;  Laterality: N/A;   [place], 05/2012   x 2   ENDOVEIN HARVEST OF GREATER SAPHENOUS VEIN Right 11/13/2020   Procedure: ENDOVEIN HARVEST OF GREATER SAPHENOUS VEIN;  Surgeon: Lajuana Matte, MD;  Location: [place];  Service: Open Heart Surgery;  Laterality: Right;   IR GENERIC HISTORICAL  04/30/2016   IR PERC CHOLECYSTOSTOMY 04/30/2016 [facility]INTER[facility] RAD   LEFT HEART CATH AND CORONARY ANGIOGRAPHY Left 10/06/2019   Procedure: LEFT HEART CATH AND CORONARY ANGIOGRAPHY;  Surgeon: Minna Merritts, MD;  Location: [place] [facility];  Service: Cardiovascular;  Laterality: Left;   LEFT HEART CATH AND CORONARY ANGIOGRAPHY N/A 11/06/2020   Procedure: LEFT HEART CATH AND CORONARY ANGIOGRAPHY;  Surgeon: Nelva Bush, MD;  Location: [place] [facility];  Service: Cardiovascular;  Laterality: N/A;   TEE WITHOUT CARDIOVERSION N/A 11/13/2020   Procedure:  TRANSESOPHAGEAL ECHOCARDIOGRAM (TEE);  Surgeon: Lajuana Matte, MD;  Location: [place];  Service: Open Heart Surgery;  Laterality: N/A;    Current Outpatient Medications  Medication Sig Dispense Refill   aspirin EC 81 MG EC tablet Take 1 tablet (81 mg total) by mouth daily. Swallow whole. 30 tablet 11   clopidogrel (PLAVIX) 75 MG tablet Take 1 tablet (75 mg total) by mouth daily. 30 tablet 11   ezetimibe (ZETIA) 10 MG tablet Take 1 tablet (10 mg total) by mouth daily.     midodrine (PROAMATINE) 2.5 MG tablet Take 1 tablet (2.5 mg total) by mouth 2 (two) times daily with a meal. 60 tablet    nitroGLYCERIN (NITROSTAT) 0.4 MG SL tablet PLACE 1 TABLET(0.'4MG'$  TOTAL) UNDER THE TONGUE EVERY 5 MINUTES AS NEEDED FOR CHEST PAIN 25 tablet 0   pantoprazole (PROTONIX) 40 MG tablet Take 1 tablet (40 mg total) by mouth daily.     rosuvastatin (CRESTOR) 40 MG tablet Take 1 tablet (40 mg total) by mouth daily.     sertraline (ZOLOFT) 100 MG tablet Take 1 tablet (100 mg total) by mouth daily.     tamsulosin (FLOMAX) 0.4 MG  CAPS capsule Take 0.4 mg by mouth daily.     No current facility-administered medications for this visit.   Facility-Administered Medications Ordered in Other Visits  Medication Dose Route Frequency Provider Last Rate Last Admin   sodium chloride flush (NS) 0.9 % injection 3 mL  3 mL Intravenous Q12H Visser, Jacquelyn D, PA-C        Allergies:   Peanut oil and Peanut-containing drug products   Social History:  The patient  reports that he quit smoking about 40 years ago. His smoking use included cigarettes. He has a 100.00 pack-year smoking history. He has never used smokeless tobacco. He reports that he does not drink alcohol and does not use drugs.   Family History:   family history includes Cancer in his brother, father, and mother; Hyperlipidemia in his father and mother; Hypertension in his father and mother.    Review of Systems: Review of Systems  Constitutional: Negative.    HENT: Negative.    Respiratory: Negative.    Cardiovascular: Negative.   Gastrointestinal: Negative.   Musculoskeletal:  Positive for joint pain.  Neurological: Negative.   Psychiatric/Behavioral: Negative.    All other systems reviewed and are negative.   PHYSICAL EXAM: VS:  Ht '5\' 11"'$  (1.803 m)   Wt 155 lb (70.3 kg)   BMI 21.62 kg/m  , BMI Body mass index is 21.62 kg/m. Constitutional:  oriented to person, place, and time. No distress.  HENT:  Head: Grossly normal Eyes:  no discharge. No scleral icterus.  Neck: No JVD, no carotid bruits  Cardiovascular: Regular rate and rhythm, no murmurs appreciated Pulmonary/Chest: Clear to auscultation bilaterally, no wheezes or rails Abdominal: Soft.  no distension.  no tenderness.  Musculoskeletal: Normal range of motion Neurological:  normal muscle tone. Coordination normal. No atrophy Skin: Skin warm and dry Psychiatric: normal affect, pleasant  Recent Labs: 12/04/2020: ALT 33; B Natriuretic Peptide 163.5; TSH 1.255 12/07/2020: BUN 16; Creatinine, Ser 0.74; Magnesium 2.0; Potassium 4.2; Sodium 138 12/24/2020: Hemoglobin 14.0; Platelets 276   Lipid Panel Lab Results  Component Value Date   CHOL 132 11/06/2020   HDL 36 (L) 11/06/2020   LDLCALC 67 11/06/2020   TRIG 143 11/06/2020      Wt Readings from Last 3 Encounters:  07/23/21 155 lb (70.3 kg)  07/03/21 156 lb (70.8 kg)  06/26/21 154 lb (69.9 kg)     ASSESSMENT AND PLAN:  Preop cardiovascular evaluation Acceptable risk for EGD and colonoscopy We will stop the Plavix No further testing needed prior to procedure  CAD, multiple vessel with stable angina Currently with no symptoms of angina. No further workup at this time. Continue current medication regimen. Stay on crestor and zetia for goal LDL <70 Cholesterol at goal, weight trending lower Hold Plavix stay on aspirin  Pure hypercholesterolemia -  Crestor, zetia, numbers at goal Has lab work done through  primary care  Essential hypertension -  Blood pressure stable, recommend he stop midodrine For any orthostasis symptoms recommend he take midodrine as needed Suggest he try to avoid additional weight loss  Claudication in peripheral vascular disease ([place]) -  lower extremity Doppler with minimal disease on the right Minimal carotid disease Reports no claudication symptoms  H/O burning pain in leg History history of neuropathy , previous lower extremity arterial Doppler was essentially normal Reports the burning pain in his legs has improved  PTSD (post-traumatic stress disorder) stable , prior drinker after getting back from [place]  Type 2 diabetes, HbA1c goal <  7% ([place]) Managed through primary care  Alcohol abuse Previous history of alcohol abuse.  He reports he no longer drinks   Total encounter time more than 30 minutes  Greater than 50% was spent in counseling and coordination of care with the patient   No orders of the defined types were placed in this encounter.     Signed, Esmond Plants, M.D., Ph.D. 07/23/2021  [place], [place]

## 2021-07-23 ENCOUNTER — Encounter: Payer: Self-pay | Admitting: Cardiovascular Disease

## 2021-07-23 ENCOUNTER — Ambulatory Visit: Payer: Medicare Other | Admitting: Cardiovascular Disease

## 2021-07-23 VITALS — BP 130/64 | HR 72 | Ht 71.0 in | Wt 155.0 lb

## 2021-07-23 DIAGNOSIS — E118 Type 2 diabetes mellitus with unspecified complications: Secondary | ICD-10-CM

## 2021-07-23 DIAGNOSIS — R55 Syncope and collapse: Secondary | ICD-10-CM | POA: Diagnosis not present

## 2021-07-23 DIAGNOSIS — I10 Essential (primary) hypertension: Secondary | ICD-10-CM | POA: Diagnosis not present

## 2021-07-23 DIAGNOSIS — I951 Orthostatic hypotension: Secondary | ICD-10-CM | POA: Diagnosis not present

## 2021-07-23 DIAGNOSIS — E785 Hyperlipidemia, unspecified: Secondary | ICD-10-CM

## 2021-07-23 DIAGNOSIS — Z951 Presence of aortocoronary bypass graft: Secondary | ICD-10-CM

## 2021-07-23 DIAGNOSIS — I25118 Atherosclerotic heart disease of native coronary artery with other forms of angina pectoris: Secondary | ICD-10-CM

## 2021-07-23 DIAGNOSIS — R9431 Abnormal electrocardiogram [ECG] [EKG]: Secondary | ICD-10-CM

## 2021-07-23 DIAGNOSIS — E1142 Type 2 diabetes mellitus with diabetic polyneuropathy: Secondary | ICD-10-CM

## 2021-07-23 NOTE — Patient Instructions (Addendum)
Medication Instructions:  Ok to stop midodrine Please stop the plavix  If you need a refill on your cardiac medications before your next appointment, please call your pharmacy.   Lab work: No new labs needed  Testing/Procedures: No new testing needed  Follow-Up: At [facility], you and your health needs are our priority.  As part of our continuing mission to provide you with exceptional heart care, we have created designated Provider Care Teams.  These Care Teams include your primary Cardiologist (physician) and Advanced Practice Providers (APPs -  Physician Assistants and Nurse Practitioners) who all work together to provide you with the care you need, when you need it.  You will need a follow up appointment in 12 months  Providers on your designated Care Team:   Murray Hodgkins, NP Christell Faith, PA-C Cadence Kathlen Mody, [place]  COVID-19 Vaccine Information can be found at: [email or web address].uk For questions related to vaccine distribution or appointments, please email [email or web address] or call [phone].

## 2021-07-24 NOTE — Congregational Nurse Program (Signed)
  Dept: [facility] Nurse Program Note  Date of Encounter: 07/24/2021   Clinic visit for follow-up from medication questions and blood pressure check.  Cardiology visit yesterday as scheduled.  Plavix d/c due to bleeding time is within range and has had large number of bruising incidents.  Midodrine was also discontinued.  BP 132/78, pulse 84 and regular, O2 Sat 98%.  Colonoscopy scheduled for June 26.  Past Medical History: Past Medical History:  Diagnosis Date   Allergy    Anxiety    Asthma    CAD, multiple vessel    COPD (chronic obstructive pulmonary disease) ([place])    Depression    Diabetes mellitus without complication (HCC)    Dyspnea    GERD (gastroesophageal reflux disease)    History of chicken pox    Hyperlipidemia    Hypertension    MI (myocardial infarction) ([place])    Prostate enlargement    PTSD (post-traumatic stress disorder)    [place] Vet   Sarcoidosis of lung ([place])     Encounter Details:  CNP Questionnaire - 07/24/21 1000       Questionnaire   Do you give verbal consent to treat you today? Yes    Location Patient [place] or Organization    Patient Status Unknown    Insurance Medicare    Insurance Referral N/A    Medication N/A    Medical Provider Yes    Screening Referrals N/A    Medical Referral N/A    Medical Appointment Made N/A    Food N/A    Transportation N/A    Housing/Utilities N/A    Interpersonal Safety N/A    Intervention Blood pressure;Support;Counsel    ED Visit Averted N/A    Life-Saving Intervention Made N/A

## 2021-07-30 ENCOUNTER — Other Ambulatory Visit: Payer: Self-pay

## 2021-07-30 ENCOUNTER — Encounter: Payer: Self-pay | Admitting: Ophthalmology

## 2021-08-08 NOTE — Discharge Instructions (Signed)

## 2021-08-10 DIAGNOSIS — Z9841 Cataract extraction status, right eye: Secondary | ICD-10-CM

## 2021-08-10 HISTORY — DX: Cataract extraction status, right eye: Z98.41

## 2021-08-12 ENCOUNTER — Ambulatory Visit: Payer: Medicare Other | Admitting: Anesthesiology

## 2021-08-12 ENCOUNTER — Encounter: Payer: Self-pay | Admitting: Ophthalmology

## 2021-08-12 ENCOUNTER — Encounter
Admission: RE | Disposition: A | Discharge status: Home / Self Care | Payer: Self-pay | Source: Ambulatory Visit | Attending: Ophthalmology

## 2021-08-12 ENCOUNTER — Other Ambulatory Visit: Payer: Self-pay

## 2021-08-12 ENCOUNTER — Ambulatory Visit
Admission: RE | Admit: 2021-08-12 | Discharge: 2021-08-12 | Disposition: A | Discharge status: Home / Self Care | Payer: Medicare Other | Source: Ambulatory Visit | Attending: Ophthalmology | Admitting: Ophthalmology

## 2021-08-12 DIAGNOSIS — Z87891 Personal history of nicotine dependence: Secondary | ICD-10-CM | POA: Diagnosis not present

## 2021-08-12 DIAGNOSIS — F32A Depression, unspecified: Secondary | ICD-10-CM | POA: Insufficient documentation

## 2021-08-12 DIAGNOSIS — I252 Old myocardial infarction: Secondary | ICD-10-CM | POA: Diagnosis not present

## 2021-08-12 DIAGNOSIS — H2511 Age-related nuclear cataract, right eye: Secondary | ICD-10-CM | POA: Diagnosis present

## 2021-08-12 DIAGNOSIS — K219 Gastro-esophageal reflux disease without esophagitis: Secondary | ICD-10-CM | POA: Diagnosis not present

## 2021-08-12 DIAGNOSIS — J449 Chronic obstructive pulmonary disease, unspecified: Secondary | ICD-10-CM | POA: Insufficient documentation

## 2021-08-12 DIAGNOSIS — E1136 Type 2 diabetes mellitus with diabetic cataract: Secondary | ICD-10-CM | POA: Diagnosis not present

## 2021-08-12 DIAGNOSIS — I10 Essential (primary) hypertension: Secondary | ICD-10-CM | POA: Diagnosis not present

## 2021-08-12 DIAGNOSIS — Z951 Presence of aortocoronary bypass graft: Secondary | ICD-10-CM | POA: Diagnosis not present

## 2021-08-12 DIAGNOSIS — F419 Anxiety disorder, unspecified: Secondary | ICD-10-CM | POA: Diagnosis not present

## 2021-08-12 DIAGNOSIS — I2510 Atherosclerotic heart disease of native coronary artery without angina pectoris: Secondary | ICD-10-CM | POA: Insufficient documentation

## 2021-08-12 HISTORY — PX: CATARACT EXTRACTION W/PHACO: SHX586

## 2021-08-12 LAB — GLUCOSE, CAPILLARY: Glucose-Capillary: 111 mg/dL — ABNORMAL HIGH (ref 70–99)

## 2021-08-12 SURGERY — PHACOEMULSIFICATION, CATARACT, WITH IOL INSERTION
Anesthesia: Monitor Anesthesia Care | Site: Eye | Laterality: Right

## 2021-08-12 MED ORDER — LIDOCAINE HCL (PF) 2 % IJ SOLN
INTRAOCULAR | Status: DC | PRN
  Administered 2021-08-12: 1 mL via INTRAOCULAR

## 2021-08-12 MED ORDER — SIGHTPATH DOSE#1 BSS IO SOLN
INTRAOCULAR | Status: DC | PRN
  Administered 2021-08-12: 98 mL via OPHTHALMIC

## 2021-08-12 MED ORDER — MOXIFLOXACIN HCL 0.5 % OP SOLN
OPHTHALMIC | Status: DC | PRN
  Administered 2021-08-12: 0.2 mL via OPHTHALMIC

## 2021-08-12 MED ORDER — ACETAMINOPHEN 160 MG/5ML PO SOLN: 325.0000 mg | ORAL | Status: DC | PRN

## 2021-08-12 MED ORDER — MIDAZOLAM HCL 2 MG/2ML IJ SOLN
INTRAMUSCULAR | Status: DC | PRN
  Administered 2021-08-12 (×2): 1 mg via INTRAVENOUS

## 2021-08-12 MED ORDER — SIGHTPATH DOSE#1 BSS IO SOLN
INTRAOCULAR | Status: DC | PRN
  Administered 2021-08-12: 15 mL

## 2021-08-12 MED ORDER — SIGHTPATH DOSE#1 NA HYALUR & NA CHOND-NA HYALUR IO KIT
PACK | INTRAOCULAR | Status: DC | PRN
  Administered 2021-08-12: 1 via OPHTHALMIC

## 2021-08-12 MED ORDER — FENTANYL CITRATE (PF) 100 MCG/2ML IJ SOLN
INTRAMUSCULAR | Status: DC | PRN
Start: 2021-08-12 — End: 2021-08-12
  Administered 2021-08-12: 50 ug via INTRAVENOUS

## 2021-08-12 MED ORDER — ARMC OPHTHALMIC DILATING DROPS
1.0000 | OPHTHALMIC | Status: DC | PRN
  Administered 2021-08-12 (×3): 1 via OPHTHALMIC

## 2021-08-12 MED ORDER — ONDANSETRON HCL 4 MG/2ML IJ SOLN: 4.0000 mg | Freq: Once | INTRAMUSCULAR | Status: DC | PRN

## 2021-08-12 MED ORDER — ACETAMINOPHEN 325 MG PO TABS: 325.0000 mg | ORAL_TABLET | ORAL | Status: DC | PRN

## 2021-08-12 MED ORDER — TETRACAINE HCL 0.5 % OP SOLN
1.0000 [drp] | OPHTHALMIC | Status: DC | PRN
  Administered 2021-08-12 (×3): 1 [drp] via OPHTHALMIC

## 2021-08-12 SURGICAL SUPPLY — 14 items
CATARACT SUITE SIGHTPATH (MISCELLANEOUS) ×2 IMPLANT
DISSECTOR HYDRO NUCLEUS 50X22 (MISCELLANEOUS) ×2 IMPLANT
FEE CATARACT SUITE SIGHTPATH (MISCELLANEOUS) ×1 IMPLANT
GLOVE SURG GAMMEX PI TX LF 7.5 (GLOVE) ×2 IMPLANT
GLOVE SURG SYN 8.5  E (GLOVE) ×1
GLOVE SURG SYN 8.5 E (GLOVE) ×1 IMPLANT
GLOVE SURG SYN 8.5 PF PI (GLOVE) ×1 IMPLANT
LENS IOL TECNIS EYHANCE 21.0 (Intraocular Lens) ×1 IMPLANT
NDL FILTER BLUNT 18X1 1/2 (NEEDLE) ×1 IMPLANT
NEEDLE FILTER BLUNT 18X 1/2SAF (NEEDLE) ×1
NEEDLE FILTER BLUNT 18X1 1/2 (NEEDLE) ×1 IMPLANT
SYR 3ML LL SCALE MARK (SYRINGE) ×2 IMPLANT
SYR 5ML LL (SYRINGE) ×2 IMPLANT
WATER STERILE IRR 250ML POUR (IV SOLUTION) ×2 IMPLANT

## 2021-08-12 NOTE — Op Note (Signed)
OPERATIVE NOTE  Ricky Abbott [id number] 08/12/2021   PREOPERATIVE DIAGNOSIS:  Nuclear sclerotic cataract right eye.  H25.11   POSTOPERATIVE DIAGNOSIS:    Nuclear sclerotic cataract right eye.     PROCEDURE:  Phacoemusification with posterior chamber intraocular lens placement of the right eye   LENS:   Implant Name Type Inv. Item Serial No. Manufacturer Lot No. LRB No. Used Action  LENS IOL TECNIS EYHANCE 21.0 - [id number] Intraocular Lens LENS IOL TECNIS EYHANCE 21.0 [id number] [employer]  Right 1 Implanted       Procedure(s) with comments: CATARACT EXTRACTION PHACO AND INTRAOCULAR LENS PLACEMENT (IOC) RIGHT DIABETIC (Right) - NEEDS ARRIVAL AFTER 7AM DUE TO TRANSPORTATION 9.57 00:54.8  DIB00 +21.0   ULTRASOUND TIME: 0 minutes 54 seconds.  CDE 9.57   SURGEON:  Benay Pillow, MD, MPH  ANESTHESIOLOGIST: Anesthesiologist: Veda Canning, MD CRNA: Silvana Newness, CRNA   ANESTHESIA:  Topical with tetracaine drops augmented with 1% preservative-free intracameral lidocaine.  ESTIMATED BLOOD LOSS: less than 1 mL.   COMPLICATIONS:  None.   DESCRIPTION OF PROCEDURE:  The patient was identified in the holding room and transported to the operating room and placed in the supine position under the operating microscope.  The right eye was identified as the operative eye and it was prepped and draped in the usual sterile ophthalmic fashion.   A 1.0 millimeter clear-corneal paracentesis was made at the 10:30 position. 0.5 ml of preservative-free 1% lidocaine with epinephrine was injected into the anterior chamber.  The anterior chamber was filled with viscoelastic.  A 2.4 millimeter keratome was used to make a near-clear corneal incision at the 8:00 position.  A curvilinear capsulorrhexis was made with a cystotome and capsulorrhexis forceps.  Balanced salt solution was used to hydrodissect and hydrodelineate the nucleus.   Phacoemulsification was then used in stop and chop fashion to remove  the lens nucleus and epinucleus.  The remaining cortex was then removed using the irrigation and aspiration handpiece. Viscoelastic was then placed into the capsular bag to distend it for lens placement.  A lens was then injected into the capsular bag.  The remaining viscoelastic was aspirated.   Wounds were hydrated with balanced salt solution.  The anterior chamber was inflated to a physiologic pressure with balanced salt solution.   Intracameral vigamox 0.1 mL undiluted was injected into the eye and a drop placed onto the ocular surface.  No wound leaks were noted.  The patient was taken to the recovery room in stable condition without complications of anesthesia or surgery  Benay Pillow 08/12/2021, 10:11 AM

## 2021-08-12 NOTE — Anesthesia Preprocedure Evaluation (Signed)
Anesthesia Evaluation  Patient identified by MRN, date of birth, ID band Patient awake    Reviewed: Allergy & Precautions, NPO status   Airway Mallampati: II  TM Distance: >3 FB     Dental   Pulmonary asthma , COPD, former smoker,    Pulmonary exam normal        Cardiovascular hypertension, + CAD, + Past MI and + CABG (2022)   Rhythm:Regular Rate:Normal     Neuro/Psych PSYCHIATRIC DISORDERS Anxiety Depression    GI/Hepatic GERD  ,(+) Hepatitis - (C - treated)  Endo/Other  diabetes  Renal/GU      Musculoskeletal  (+) Arthritis ,   Abdominal   Peds  Hematology   Anesthesia Other Findings   Reproductive/Obstetrics                             Anesthesia Physical Anesthesia Plan  ASA: 3  Anesthesia Plan: MAC   Post-op Pain Management: Minimal or no pain anticipated   Induction: Intravenous  PONV Risk Score and Plan: TIVA, Midazolam and Treatment may vary due to age or medical condition  Airway Management Planned: Natural Airway and Nasal Cannula  Additional Equipment:   Intra-op Plan:   Post-operative Plan:   Informed Consent: I have reviewed the patients History and Physical, chart, labs and discussed the procedure including the risks, benefits and alternatives for the proposed anesthesia with the patient or authorized representative who has indicated his/her understanding and acceptance.       Plan Discussed with: CRNA  Anesthesia Plan Comments:         Anesthesia Quick Evaluation

## 2021-08-12 NOTE — Anesthesia Postprocedure Evaluation (Signed)
Anesthesia Post Note  Patient: Ricky Abbott  Procedure(s) Performed: CATARACT EXTRACTION PHACO AND INTRAOCULAR LENS PLACEMENT (IOC) RIGHT DIABETIC (Right: Eye)     Patient location during evaluation: PACU Anesthesia Type: MAC Level of consciousness: awake Pain management: pain level controlled Vital Signs Assessment: post-procedure vital signs reviewed and stable Respiratory status: respiratory function stable Cardiovascular status: stable Postop Assessment: no apparent nausea or vomiting Anesthetic complications: no   No notable events documented.  Veda Canning

## 2021-08-12 NOTE — H&P (Signed)
[facility]   Primary Care Physician:  Sue Lush, PA-C Ophthalmologist: Dr. Benay Pillow  Pre-Procedure History & Physical: HPI:  Ricky Abbott is a 76 y.o. male here for cataract surgery.   Past Medical History:  Diagnosis Date   Allergy    Anxiety    Asthma    CAD, multiple vessel    COPD (chronic obstructive pulmonary disease) ([place])    Depression    Diabetes mellitus without complication (HCC)    Dyspnea    GERD (gastroesophageal reflux disease)    History of chicken pox    Hyperlipidemia    Hypertension    MI (myocardial infarction) ([place])    Prostate enlargement    PTSD (post-traumatic stress disorder)    [place] Vet   Sarcoidosis of lung ([place])     Past Surgical History:  Procedure Laterality Date   billary tube placement     [place], [place] N/A 06/05/2016   Procedure: LAPAROSCOPIC CHOLECYSTECTOMY with cholangiogram;  Surgeon: Jules Husbands, MD;  Location: [facility];  Service: General;  Laterality: N/A;   CORONARY ARTERY BYPASS GRAFT N/A 11/13/2020   Procedure: CORONARY ARTERY BYPASS GRAFTING (CABG) x 4 ON CARDIOPULMONARY BYPASS USING LIMA AND RIGHT GSV. -LIMA to LAD -SVG to OM -SVG to PDA -SVG to DIAGONAL;  Surgeon: Lajuana Matte, MD;  Location: [place];  Service: Open Heart Surgery;  Laterality: N/A;   [place], 05/2012   x 2   ENDOVEIN HARVEST OF GREATER SAPHENOUS VEIN Right 11/13/2020   Procedure: ENDOVEIN HARVEST OF GREATER SAPHENOUS VEIN;  Surgeon: Lajuana Matte, MD;  Location: [place];  Service: Open Heart Surgery;  Laterality: Right;   IR GENERIC HISTORICAL  04/30/2016   IR PERC CHOLECYSTOSTOMY 04/30/2016 [facility]INTER[facility] RAD   LEFT HEART CATH AND CORONARY ANGIOGRAPHY Left 10/06/2019   Procedure: LEFT HEART CATH AND CORONARY ANGIOGRAPHY;  Surgeon: Minna Merritts, MD;  Location: [place] [facility];  Service: Cardiovascular;   Laterality: Left;   LEFT HEART CATH AND CORONARY ANGIOGRAPHY N/A 11/06/2020   Procedure: LEFT HEART CATH AND CORONARY ANGIOGRAPHY;  Surgeon: Nelva Bush, MD;  Location: [place] [facility];  Service: Cardiovascular;  Laterality: N/A;   TEE WITHOUT CARDIOVERSION N/A 11/13/2020   Procedure: TRANSESOPHAGEAL ECHOCARDIOGRAM (TEE);  Surgeon: Lajuana Matte, MD;  Location: [place];  Service: Open Heart Surgery;  Laterality: N/A;    Prior to Admission medications   Medication Sig Start Date End Date Taking? Authorizing Provider  aspirin EC 81 MG EC tablet Take 1 tablet (81 mg total) by mouth daily. Swallow whole. 11/07/20  Yes Nicole Kindred A, DO  ezetimibe (ZETIA) 10 MG tablet Take 1 tablet (10 mg total) by mouth daily. 11/07/20  Yes Nicole Kindred A, DO  pantoprazole (PROTONIX) 40 MG tablet Take 1 tablet (40 mg total) by mouth daily. 11/07/20  Yes Nicole Kindred A, DO  rosuvastatin (CRESTOR) 40 MG tablet Take 1 tablet (40 mg total) by mouth daily. 11/07/20  Yes Nicole Kindred A, DO  sertraline (ZOLOFT) 100 MG tablet Take 1 tablet (100 mg total) by mouth daily. 11/07/20  Yes Nicole Kindred A, DO  clopidogrel (PLAVIX) 75 MG tablet Take 1 tablet (75 mg total) by mouth daily. Patient not taking: Reported on 07/30/2021 11/20/20   Barrett, Junie Panning R, PA-C  midodrine (PROAMATINE) 2.5 MG tablet Take 1 tablet (2.5  mg total) by mouth 2 (two) times daily with a meal. Patient not taking: Reported on 07/30/2021 12/24/20   Lenna Sciara, NP  nitroGLYCERIN (NITROSTAT) 0.4 MG SL tablet PLACE 1 TABLET(0.'4MG'$  TOTAL) UNDER THE TONGUE EVERY 5 MINUTES AS NEEDED FOR CHEST PAIN 07/09/21   Minna Merritts, MD  tamsulosin (FLOMAX) 0.4 MG CAPS capsule Take 0.4 mg by mouth daily. Patient not taking: Reported on 07/30/2021    [provider]    Allergies as of 07/19/2021 - Review Complete 01/22/2021  Allergen Reaction Noted   Peanut-containing drug products Swelling 04/28/2016    Family History  Problem  Relation Age of Onset   Cancer Mother    Hyperlipidemia Mother    Hypertension Mother    Cancer Father        stomach   Hyperlipidemia Father    Hypertension Father    Cancer Brother        bone cancer    Social History   Socioeconomic History   Marital status: Single    Spouse name: Not on file   Number of children: Not on file   Years of education: Not on file   Highest education level: Not on file  Occupational History   Not on file  Tobacco Use   Smoking status: Former    Packs/day: 4.00    Years: 25.00    Total pack years: 100.00    Types: Cigarettes    Quit date: 03/24/1981    Years since quitting: 40.4   Smokeless tobacco: Never   Tobacco comments:    quit 30 years asgo  Vaping Use   Vaping Use: Never used  Substance and Sexual Activity   Alcohol use: No   Drug use: No   Sexual activity: Never  Other Topics Concern   Not on file  Social History Narrative   Yvone Neu grew up in [place], [place]. He moved to the [place] area in [place]. 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   Social Determinants of Health   Financial Resource Strain: Not on file  Food Insecurity: Not on file  Transportation Needs: Not on file  Physical Activity: Not on file  Stress: Not on file  Social Connections: Not on file  Intimate Partner Violence: Not on file    Review of Systems: See HPI, otherwise negative ROS  Physical Exam: BP (!) 153/97   Pulse 72   Temp 98.1 F (36.7 C) (Temporal)   Ht '5\' 11"'$  (1.803 m)   Wt 70.3 kg   SpO2 97%   BMI 21.60 kg/m  General:   Alert, cooperative in NAD Head:  Normocephalic and atraumatic. Respiratory:  Normal work of breathing. Cardiovascular:  RRR  Impression/Plan: Willies Laviolette Nase is here for cataract surgery.  Risks, benefits, limitations, and alternatives regarding cataract surgery have been reviewed with the patient.  Questions have been answered.  All parties agreeable.   Benay Pillow, MD  08/12/2021, 9:43 AM

## 2021-08-12 NOTE — Transfer of Care (Signed)
Immediate Anesthesia Transfer of Care Note  Patient: Ricky Abbott  Procedure(s) Performed: CATARACT EXTRACTION PHACO AND INTRAOCULAR LENS PLACEMENT (IOC) RIGHT DIABETIC (Right: Eye)  Patient Location: PACU  Anesthesia Type: MAC  Level of Consciousness: awake, alert  and patient cooperative  Airway and Oxygen Therapy: Patient Spontanous Breathing and Patient connected to supplemental oxygen  Post-op Assessment: Post-op Vital signs reviewed, Patient's Cardiovascular Status Stable, Respiratory Function Stable, Patent Airway and No signs of Nausea or vomiting  Post-op Vital Signs: Reviewed and stable  Complications: No notable events documented.

## 2021-08-14 ENCOUNTER — Encounter: Payer: Self-pay | Admitting: Ophthalmology

## 2021-08-14 NOTE — Congregational Nurse Program (Signed)
  Dept: [place] Nurse Program Note  Date of Encounter: 08/14/2021  Resident had cataract extraction on July 3rd and spoke per telephone rather than come to the clinic.  Reviewed use of eye drops given after the procedure.  States he is using them as prescribed and that he doesn't need any assistance with their use.  No side effects of eye drops.  Will have the extraction of cataract other eye on July 17th. Past Medical History: Past Medical History:  Diagnosis Date   Allergy    Anxiety    Asthma    CAD, multiple vessel    COPD (chronic obstructive pulmonary disease) ([place])    Depression    Diabetes mellitus without complication (HCC)    Dyspnea    GERD (gastroesophageal reflux disease)    History of chicken pox    Hyperlipidemia    Hypertension    MI (myocardial infarction) ([place])    Prostate enlargement    PTSD (post-traumatic stress disorder)    [place] Vet   Sarcoidosis of lung ([place])     Encounter Details:

## 2021-08-21 NOTE — Congregational Nurse Program (Signed)
Dept: [place] Nurse Program Note  Date of Encounter: 08/21/2021   Dept: [place] Nurse Program Note  Date of Encounter: 08/21/2021  Telephone call to resident who had right cataract extraction last week and is to have left cataract extraction 08/26/2021. Had just returned from post-op visit at time of call. No redness, drainage or swelling of eye area.  Using eye drops as prescribed.  States that he has gained 11 lbs since may 24th weight of 156 lbs.  Weighed self while on phone with talking scale and result was 167 lbs.  Past Medical History: Past Medical History:  Diagnosis Date   Allergy    Anxiety    Asthma    CAD, multiple vessel    COPD (chronic obstructive pulmonary disease) ([place])    Depression    Diabetes mellitus without complication (HCC)    Dyspnea    GERD (gastroesophageal reflux disease)    History of chicken pox    Hyperlipidemia    Hypertension    MI (myocardial infarction) ([place])    Prostate enlargement    PTSD (post-traumatic stress disorder)    [place] Vet   Sarcoidosis of lung ([place])     Encounter Details:  CNP Questionnaire - 08/21/21 1336       Questionnaire   Do you give verbal consent to treat you today? Yes    Location [facility]    Visit Setting Phone/Text/Email    Patient Status Unknown    Insurance Medicare    Insurance Referral N/A    Medication N/A    Medical Provider Yes    Screening Referrals N/A    Medical Referral N/A    Medical Appointment Made N/A    Food N/A    Transportation N/A    Housing/Utilities N/A    Interpersonal Safety N/A    Intervention Support;Counsel    ED Visit Averted N/A    Life-Saving Intervention Made N/A                 Past Medical History: Past Medical History:  Diagnosis Date   Allergy    Anxiety    Asthma    CAD, multiple vessel    COPD (chronic obstructive pulmonary disease) ([place])    Depression    Diabetes  mellitus without complication (HCC)    Dyspnea    GERD (gastroesophageal reflux disease)    History of chicken pox    Hyperlipidemia    Hypertension    MI (myocardial infarction) ([place])    Prostate enlargement    PTSD (post-traumatic stress disorder)    [place] Vet   Sarcoidosis of lung ([place])     Encounter Details:  CNP Questionnaire - 08/21/21 1336       Questionnaire   Do you give verbal consent to treat you today? Yes    Location Patient [facility]    Visit Setting Phone/Text/Email    Patient Status Unknown    Insurance Medicare    Insurance Referral N/A    Medication N/A    Medical Provider Yes    Screening Referrals N/A    Medical Referral N/A    Medical Appointment Made N/A    Food N/A    Transportation N/A    Housing/Utilities N/A    Interpersonal Safety N/A    Intervention Support;Counsel    ED Visit Averted N/A    Life-Saving Intervention Made N/A

## 2021-08-23 NOTE — Discharge Instructions (Signed)

## 2021-08-26 ENCOUNTER — Ambulatory Visit
Admission: RE | Admit: 2021-08-26 | Discharge: 2021-08-26 | Disposition: A | Discharge status: Home / Self Care | Payer: Medicare Other | Source: Ambulatory Visit | Attending: Ophthalmology | Admitting: Ophthalmology

## 2021-08-26 ENCOUNTER — Encounter: Payer: Self-pay | Admitting: Ophthalmology

## 2021-08-26 ENCOUNTER — Ambulatory Visit: Payer: Medicare Other | Admitting: Anesthesiology

## 2021-08-26 ENCOUNTER — Encounter
Admission: RE | Disposition: A | Discharge status: Home / Self Care | Payer: Self-pay | Source: Ambulatory Visit | Attending: Ophthalmology

## 2021-08-26 ENCOUNTER — Other Ambulatory Visit: Payer: Self-pay

## 2021-08-26 DIAGNOSIS — J449 Chronic obstructive pulmonary disease, unspecified: Secondary | ICD-10-CM | POA: Insufficient documentation

## 2021-08-26 DIAGNOSIS — K219 Gastro-esophageal reflux disease without esophagitis: Secondary | ICD-10-CM | POA: Diagnosis not present

## 2021-08-26 DIAGNOSIS — Z87891 Personal history of nicotine dependence: Secondary | ICD-10-CM | POA: Diagnosis not present

## 2021-08-26 DIAGNOSIS — I2510 Atherosclerotic heart disease of native coronary artery without angina pectoris: Secondary | ICD-10-CM | POA: Insufficient documentation

## 2021-08-26 DIAGNOSIS — I10 Essential (primary) hypertension: Secondary | ICD-10-CM | POA: Insufficient documentation

## 2021-08-26 DIAGNOSIS — H2512 Age-related nuclear cataract, left eye: Secondary | ICD-10-CM | POA: Diagnosis present

## 2021-08-26 DIAGNOSIS — F418 Other specified anxiety disorders: Secondary | ICD-10-CM | POA: Diagnosis not present

## 2021-08-26 DIAGNOSIS — Z951 Presence of aortocoronary bypass graft: Secondary | ICD-10-CM | POA: Insufficient documentation

## 2021-08-26 DIAGNOSIS — I252 Old myocardial infarction: Secondary | ICD-10-CM | POA: Diagnosis not present

## 2021-08-26 HISTORY — PX: CATARACT EXTRACTION W/PHACO: SHX586

## 2021-08-26 LAB — GLUCOSE, CAPILLARY
Glucose-Capillary: 117 mg/dL — ABNORMAL HIGH (ref 70–99)
Glucose-Capillary: 109 mg/dL — ABNORMAL HIGH (ref 70–99)

## 2021-08-26 LAB — HM DIABETES EYE EXAM

## 2021-08-26 SURGERY — PHACOEMULSIFICATION, CATARACT, WITH IOL INSERTION
Anesthesia: Monitor Anesthesia Care | Site: Eye | Laterality: Left

## 2021-08-26 MED ORDER — TETRACAINE HCL 0.5 % OP SOLN
1.0000 [drp] | OPHTHALMIC | Status: DC | PRN
  Administered 2021-08-26 (×2): 1 [drp] via OPHTHALMIC

## 2021-08-26 MED ORDER — LIDOCAINE HCL (PF) 2 % IJ SOLN
INTRAOCULAR | Status: DC | PRN
  Administered 2021-08-26: 1 mL via INTRAOCULAR

## 2021-08-26 MED ORDER — SIGHTPATH DOSE#1 NA HYALUR & NA CHOND-NA HYALUR IO KIT
PACK | INTRAOCULAR | Status: DC | PRN
  Administered 2021-08-26: 1 via OPHTHALMIC

## 2021-08-26 MED ORDER — MIDAZOLAM HCL 2 MG/2ML IJ SOLN
INTRAMUSCULAR | Status: DC | PRN
  Administered 2021-08-26: 2 mg via INTRAVENOUS

## 2021-08-26 MED ORDER — SIGHTPATH DOSE#1 BSS IO SOLN
INTRAOCULAR | Status: DC | PRN
  Administered 2021-08-26: 91 mL via OPHTHALMIC

## 2021-08-26 MED ORDER — MOXIFLOXACIN HCL 0.5 % OP SOLN
OPHTHALMIC | Status: DC | PRN
  Administered 2021-08-26: 0.2 mL via OPHTHALMIC

## 2021-08-26 MED ORDER — FENTANYL CITRATE (PF) 100 MCG/2ML IJ SOLN
INTRAMUSCULAR | Status: DC | PRN
  Administered 2021-08-26: 100 ug via INTRAVENOUS

## 2021-08-26 MED ORDER — ARMC OPHTHALMIC DILATING DROPS
1.0000 | OPHTHALMIC | Status: DC | PRN
  Administered 2021-08-26 (×2): 1 via OPHTHALMIC

## 2021-08-26 MED ORDER — SIGHTPATH DOSE#1 BSS IO SOLN
INTRAOCULAR | Status: DC | PRN
  Administered 2021-08-26: 15 mL

## 2021-08-26 SURGICAL SUPPLY — 14 items
CATARACT SUITE SIGHTPATH (MISCELLANEOUS) ×2 IMPLANT
DISSECTOR HYDRO NUCLEUS 50X22 (MISCELLANEOUS) ×2 IMPLANT
FEE CATARACT SUITE SIGHTPATH (MISCELLANEOUS) ×1 IMPLANT
GLOVE SURG GAMMEX PI TX LF 7.5 (GLOVE) ×2 IMPLANT
GLOVE SURG SYN 8.5  E (GLOVE) ×1
GLOVE SURG SYN 8.5 E (GLOVE) ×1 IMPLANT
GLOVE SURG SYN 8.5 PF PI (GLOVE) ×1 IMPLANT
LENS IOL TECNIS EYHANCE 20.5 (Intraocular Lens) ×1 IMPLANT
NDL FILTER BLUNT 18X1 1/2 (NEEDLE) ×1 IMPLANT
NEEDLE FILTER BLUNT 18X 1/2SAF (NEEDLE) ×1
NEEDLE FILTER BLUNT 18X1 1/2 (NEEDLE) ×1 IMPLANT
SYR 3ML LL SCALE MARK (SYRINGE) ×2 IMPLANT
SYR 5ML LL (SYRINGE) ×2 IMPLANT
WATER STERILE IRR 250ML POUR (IV SOLUTION) ×2 IMPLANT

## 2021-08-26 NOTE — H&P (Signed)
[facility]   Primary Care Physician:  Sue Lush, PA-C Ophthalmologist: Dr. Benay Pillow  Pre-Procedure History & Physical: HPI:  Ricky Abbott is a 76 y.o. male here for cataract surgery.   Past Medical History:  Diagnosis Date   Allergy    Anxiety    Asthma    CAD, multiple vessel    COPD (chronic obstructive pulmonary disease) ([place])    Depression    Diabetes mellitus without complication (HCC)    Dyspnea    GERD (gastroesophageal reflux disease)    History of chicken pox    Hyperlipidemia    Hypertension    MI (myocardial infarction) ([place])    Prostate enlargement    PTSD (post-traumatic stress disorder)    [place] Vet   Sarcoidosis of lung ([place])     Past Surgical History:  Procedure Laterality Date   billary tube placement     [place] EXTRACTION W/PHACO Right 08/12/2021   Procedure: CATARACT EXTRACTION PHACO AND INTRAOCULAR LENS PLACEMENT ([place]) RIGHT DIABETIC;  Surgeon: Eulogio Bear, MD;  Location: [place];  Service: Ophthalmology;  Laterality: Right;  NEEDS ARRIVAL AFTER 7AM DUE TO TRANSPORTATION 9.57 00:54.8   CHOLECYSTECTOMY N/A 06/05/2016   Procedure: LAPAROSCOPIC CHOLECYSTECTOMY with cholangiogram;  Surgeon: Jules Husbands, MD;  Location: [facility];  Service: General;  Laterality: N/A;   CORONARY ARTERY BYPASS GRAFT N/A 11/13/2020   Procedure: CORONARY ARTERY BYPASS GRAFTING (CABG) x 4 ON CARDIOPULMONARY BYPASS USING LIMA AND RIGHT GSV. -LIMA to LAD -SVG to OM -SVG to PDA -SVG to DIAGONAL;  Surgeon: Lajuana Matte, MD;  Location: [facility];  Service: Open Heart Surgery;  Laterality: N/A;   [place], 05/2012   x 2   ENDOVEIN HARVEST OF GREATER SAPHENOUS VEIN Right 11/13/2020   Procedure: ENDOVEIN HARVEST OF GREATER SAPHENOUS VEIN;  Surgeon: Lajuana Matte, MD;  Location: [place];  Service: Open Heart Surgery;   Laterality: Right;   IR GENERIC HISTORICAL  04/30/2016   IR PERC CHOLECYSTOSTOMY 04/30/2016 [facility]INTERV RAD   LEFT HEART CATH AND CORONARY ANGIOGRAPHY Left 10/06/2019   Procedure: LEFT HEART CATH AND CORONARY ANGIOGRAPHY;  Surgeon: Minna Merritts, MD;  Location: [facility];  Service: Cardiovascular;  Laterality: Left;   LEFT HEART CATH AND CORONARY ANGIOGRAPHY N/A 11/06/2020   Procedure: LEFT HEART CATH AND CORONARY ANGIOGRAPHY;  Surgeon: Nelva Bush, MD;  Location: [facility];  Service: Cardiovascular;  Laterality: N/A;   TEE WITHOUT CARDIOVERSION N/A 11/13/2020   Procedure: TRANSESOPHAGEAL ECHOCARDIOGRAM (TEE);  Surgeon: Lajuana Matte, MD;  Location: [facility];  Service: Open Heart Surgery;  Laterality: N/A;    Prior to Admission medications   Medication Sig Start Date End Date Taking? Authorizing Provider  aspirin EC 81 MG EC tablet Take 1 tablet (81 mg total) by mouth daily. Swallow whole. 11/07/20  Yes Nicole Kindred A, DO  ezetimibe (ZETIA) 10 MG tablet Take 1 tablet (10 mg total) by mouth daily. 11/07/20  Yes Nicole Kindred A, DO  pantoprazole (PROTONIX) 40 MG tablet Take 1 tablet (40 mg total) by mouth daily. 11/07/20  Yes Nicole Kindred A, DO  rosuvastatin (CRESTOR) 40 MG tablet Take 1 tablet (40 mg total) by mouth daily. 11/07/20  Yes Nicole Kindred A, DO  sertraline (ZOLOFT) 100 MG tablet Take 1 tablet (100 mg total) by  mouth daily. 11/07/20  Yes Nicole Kindred A, DO  tamsulosin (FLOMAX) 0.4 MG CAPS capsule Take 0.4 mg by mouth daily.   Yes [provider]  clopidogrel (PLAVIX) 75 MG tablet Take 1 tablet (75 mg total) by mouth daily. Patient not taking: Reported on 07/30/2021 11/20/20   Barrett, Lodema Hong, PA-C  midodrine (PROAMATINE) 2.5 MG tablet Take 1 tablet (2.5 mg total) by mouth 2 (two) times daily with a meal. Patient not taking: Reported on 07/30/2021 12/24/20   Lenna Sciara, NP  nitroGLYCERIN (NITROSTAT) 0.4 MG SL tablet PLACE 1 TABLET(0.'4MG'$   TOTAL) UNDER THE TONGUE EVERY 5 MINUTES AS NEEDED FOR CHEST PAIN 07/09/21   Minna Merritts, MD    Allergies as of 07/19/2021 - Review Complete 01/22/2021  Allergen Reaction Noted   Peanut-containing drug products Swelling 04/28/2016    Family History  Problem Relation Age of Onset   Cancer Mother    Hyperlipidemia Mother    Hypertension Mother    Cancer Father        stomach   Hyperlipidemia Father    Hypertension Father    Cancer Brother        bone cancer    Social History   Socioeconomic History   Marital status: Single    Spouse name: Not on file   Number of children: Not on file   Years of education: Not on file   Highest education level: Not on file  Occupational History   Not on file  Tobacco Use   Smoking status: Former    Packs/day: 4.00    Years: 25.00    Total pack years: 100.00    Types: Cigarettes    Quit date: 03/24/1981    Years since quitting: 40.4   Smokeless tobacco: Never   Tobacco comments:    quit 30 years asgo  Vaping Use   Vaping Use: Never used  Substance and Sexual Activity   Alcohol use: No   Drug use: No   Sexual activity: Never  Other Topics Concern   Not on file  Social History Narrative   Yvone Neu grew up in [place], [place]. He moved to the [place] area in 2013. 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   Social Determinants of Health   Financial Resource Strain: Not on file  Food Insecurity: Not on file  Transportation Needs: Not on file  Physical Activity: Not on file  Stress: Not on file  Social Connections: Not on file  Intimate Partner Violence: Not on file    Review of Systems: See HPI, otherwise negative ROS  Physical Exam: BP (!) 161/84   Pulse 65   Temp 98 F (36.7 C) (Temporal)   Resp 20   Ht '5\' 11"'$  (1.803 m)   Wt 74.4 kg   SpO2 98%   BMI 22.87 kg/m  General:   Alert, cooperative in NAD Head:  Normocephalic and atraumatic. Respiratory:  Normal work of breathing. Cardiovascular:   RRR  Impression/Plan: Ricky Abbott is here for cataract surgery.  Risks, benefits, limitations, and alternatives regarding cataract surgery have been reviewed with the patient.  Questions have been answered.  All parties agreeable.   Benay Pillow, MD  08/26/2021, 11:35 AM

## 2021-08-26 NOTE — Anesthesia Preprocedure Evaluation (Signed)
Anesthesia Evaluation  Patient identified by MRN, date of birth, ID band Patient awake    Reviewed: Allergy & Precautions, NPO status , Patient's Chart, lab work & pertinent test results  Airway Mallampati: II  TM Distance: >3 FB Neck ROM: Full    Dental no notable dental hx.    Pulmonary asthma , COPD, former smoker,    Pulmonary exam normal        Cardiovascular hypertension, + CAD, + Past MI and + CABG (2022)  Normal cardiovascular exam     Neuro/Psych PSYCHIATRIC DISORDERS Anxiety Depression negative neurological ROS     GI/Hepatic GERD  ,(+) Hepatitis - (C - treated)  Endo/Other  diabetes  Renal/GU      Musculoskeletal  (+) Arthritis , Osteoarthritis,    Abdominal Normal abdominal exam  (+)   Peds  Hematology negative hematology ROS (+)   Anesthesia Other Findings   Reproductive/Obstetrics                             Anesthesia Physical  Anesthesia Plan  ASA: 3  Anesthesia Plan: MAC   Post-op Pain Management: Minimal or no pain anticipated   Induction: Intravenous  PONV Risk Score and Plan: 1 and TIVA, Midazolam and Treatment may vary due to age or medical condition  Airway Management Planned: Natural Airway and Nasal Cannula  Additional Equipment:   Intra-op Plan:   Post-operative Plan:   Informed Consent: I have reviewed the patients History and Physical, chart, labs and discussed the procedure including the risks, benefits and alternatives for the proposed anesthesia with the patient or authorized representative who has indicated his/her understanding and acceptance.     Dental advisory given  Plan Discussed with: CRNA  Anesthesia Plan Comments:         Anesthesia Quick Evaluation

## 2021-08-26 NOTE — Transfer of Care (Signed)
Immediate Anesthesia Transfer of Care Note  Patient: Ricky Abbott  Procedure(s) Performed: CATARACT EXTRACTION PHACO AND INTRAOCULAR LENS PLACEMENT (IOC) LEFT DIABETIC (Left: Eye)  Patient Location: PACU  Anesthesia Type: MAC  Level of Consciousness: awake, alert  and patient cooperative  Airway and Oxygen Therapy: Patient Spontanous Breathing and Patient connected to supplemental oxygen  Post-op Assessment: Post-op Vital signs reviewed, Patient's Cardiovascular Status Stable, Respiratory Function Stable, Patent Airway and No signs of Nausea or vomiting  Post-op Vital Signs: Reviewed and stable  Complications: No notable events documented.

## 2021-08-26 NOTE — Anesthesia Postprocedure Evaluation (Signed)
Anesthesia Post Note  Patient: Ricky Abbott  Procedure(s) Performed: CATARACT EXTRACTION PHACO AND INTRAOCULAR LENS PLACEMENT (IOC) LEFT DIABETIC (Left: Eye)     Patient location during evaluation: PACU Anesthesia Type: MAC Level of consciousness: awake and alert Pain management: pain level controlled Vital Signs Assessment: post-procedure vital signs reviewed and stable Respiratory status: spontaneous breathing and nonlabored ventilation Cardiovascular status: blood pressure returned to baseline Postop Assessment: no apparent nausea or vomiting Anesthetic complications: no   No notable events documented.  Mael Delap [employer]

## 2021-08-26 NOTE — Op Note (Signed)
OPERATIVE NOTE  Ricky Abbott [id number] 08/26/2021   PREOPERATIVE DIAGNOSIS:  Nuclear sclerotic cataract left eye.  H25.12   POSTOPERATIVE DIAGNOSIS:    Nuclear sclerotic cataract left eye.     PROCEDURE:  Phacoemusification with posterior chamber intraocular lens placement of the left eye   LENS:   Implant Name Type Inv. Item Serial No. Manufacturer Lot No. LRB No. Used Action  LENS IOL TECNIS EYHANCE 20.5 - [id number] Intraocular Lens LENS IOL TECNIS EYHANCE 20.5 [id number] [employer]  Left 1 Implanted      Procedure(s) with comments: CATARACT EXTRACTION PHACO AND INTRAOCULAR LENS PLACEMENT (IOC) LEFT DIABETIC (Left) - 7.59 00:50.5  DIB00 +20.5   ULTRASOUND TIME: 0 minutes 50 seconds.  CDE 7.59   SURGEON:  Benay Pillow, MD, MPH   ANESTHESIA:  Topical with tetracaine drops augmented with 1% preservative-free intracameral lidocaine.  ESTIMATED BLOOD LOSS: <1 mL   COMPLICATIONS:  None.   DESCRIPTION OF PROCEDURE:  The patient was identified in the holding room and transported to the operating room and placed in the supine position under the operating microscope.  The left eye was identified as the operative eye and it was prepped and draped in the usual sterile ophthalmic fashion.   A 1.0 millimeter clear-corneal paracentesis was made at the 5:00 position. 0.5 ml of preservative-free 1% lidocaine with epinephrine was injected into the anterior chamber.  The anterior chamber was filled with viscoelastic.  A 2.4 millimeter keratome was used to make a near-clear corneal incision at the 2:00 position.  A curvilinear capsulorrhexis was made with a cystotome and capsulorrhexis forceps.  Balanced salt solution was used to hydrodissect and hydrodelineate the nucleus.   Phacoemulsification was then used in stop and chop fashion to remove the lens nucleus and epinucleus.  The remaining cortex was then removed using the irrigation and aspiration handpiece. Viscoelastic was then placed  into the capsular bag to distend it for lens placement.  A lens was then injected into the capsular bag.  The remaining viscoelastic was aspirated.   Wounds were hydrated with balanced salt solution.  The anterior chamber was inflated to a physiologic pressure with balanced salt solution.  Intracameral vigamox 0.1 mL undiltued was injected into the eye and a drop placed onto the ocular surface.  No wound leaks were noted.  The patient was taken to the recovery room in stable condition without complications of anesthesia or surgery  Benay Pillow 08/26/2021, 12:01 PM

## 2021-08-27 ENCOUNTER — Encounter: Payer: Self-pay | Admitting: Ophthalmology

## 2021-10-02 NOTE — Congregational Nurse Program (Signed)
Dept: [place] Nurse Program Note  Date of Encounter: 10/02/2021  Past Medical History: Past Medical History:  Diagnosis Date   Allergy    Anxiety    Asthma    CAD, multiple vessel    COPD (chronic obstructive pulmonary disease) ([place])    Depression    Diabetes mellitus without complication (HCC)    Dyspnea    GERD (gastroesophageal reflux disease)    History of chicken pox    Hyperlipidemia    Hypertension    MI (myocardial infarction) ([place])    Prostate enlargement    PTSD (post-traumatic stress disorder)    [place] Vet   Sarcoidosis of lung ([place])     Encounter Details:  CNP Questionnaire - 10/02/21 1045       Questionnaire   Do you give verbal consent to treat you today? Yes    Location Patient [facility] or Organization    Patient Status Unknown    Insurance Medicaid;Medicare    Insurance Referral N/A    Medication N/A    Medical Provider Yes    Screening Referrals N/A    Medical Referral N/A    Medical Appointment Made N/A    Food N/A    Transportation N/A    Housing/Utilities N/A    Interpersonal Safety N/A    Intervention Blood pressure;Counsel;Support    ED Visit Averted N/A    Life-Saving Intervention Made N/A               Dept: [id number]   Congregational Nurse Program Note  Date of Encounter: 10/02/2021  Clinic visit for blood pressure check, BP 140/76, pulse 74 and regular.  O2 Sat 98%, respirations 14. No complaints of any episodes of chest pain, states he is taking all medications as prescribed.  Has had cataract extractions both eyes in July, wearing dark glasses but has completed all of prescribed eye drops.  Missed colonoscopy appt due to a transportation program mixup.  Denies any GI pain or bleeding, appt to be rescheduled. Past Medical History: Past Medical History:  Diagnosis Date   Allergy    Anxiety    Asthma    CAD, multiple vessel    COPD (chronic  obstructive pulmonary disease) ([place])    Depression    Diabetes mellitus without complication (HCC)    Dyspnea    GERD (gastroesophageal reflux disease)    History of chicken pox    Hyperlipidemia    Hypertension    MI (myocardial infarction) ([place])    Prostate enlargement    PTSD (post-traumatic stress disorder)    [place] Vet   Sarcoidosis of lung ([place])     Encounter Details:  CNP Questionnaire - 10/02/21 1045       Questionnaire   Do you give verbal consent to treat you today? Yes    Location Patient [facility] or Organization    Patient Status Unknown    Insurance Medicaid;Medicare    Insurance Referral N/A    Medication N/A    Medical Provider Yes    Screening Referrals N/A    Medical Referral N/A    Medical Appointment Made N/A    Food N/A    Transportation N/A    Housing/Utilities N/A    Interpersonal Safety N/A    Intervention Blood pressure;Counsel;Support    ED Visit Averted N/A    Life-Saving Intervention Made N/A

## 2021-10-23 NOTE — Congregational Nurse Program (Signed)
  Dept: [place] Nurse Program Note  Date of Encounter: 10/23/2021  Clinic visit for blood pressure check.  BP 130/74, heart rate 80 and regular, respirations 16, O2 Sat 99%. Weight 160 Lbs.  No complaints of any GI issues today, has decided to not have colonoscopy until next year after he switches insurance carriers. Discussed points to consider when switching insurance carrier. No GI bleeding or excessive flatulence and he has regained 5 Lbs of lost weight. Past Medical History: Past Medical History:  Diagnosis Date   Allergy    Anxiety    Asthma    CAD, multiple vessel    COPD (chronic obstructive pulmonary disease) ([place])    Depression    Diabetes mellitus without complication (HCC)    Dyspnea    GERD (gastroesophageal reflux disease)    History of chicken pox    Hyperlipidemia    Hypertension    MI (myocardial infarction) ([place])    Prostate enlargement    PTSD (post-traumatic stress disorder)    [place] Vet   Sarcoidosis of lung ([place])     Encounter Details:  CNP Questionnaire - 10/23/21 1400       Questionnaire   Do you give verbal consent to treat you today? Yes    Location Patient Ricky Abbott or Organization    Patient Status Unknown    Insurance Medicare;Medicaid    Insurance Referral N/A    Medication N/A    Medical Provider Yes    Screening Referrals N/A    Medical Referral N/A    Medical Appointment Made N/A    Food N/A    Transportation N/A    Housing/Utilities N/A    Interpersonal Safety N/A    Intervention Counsel;Blood pressure;Navigate Healthcare System    ED Visit Averted N/A    Life-Saving Intervention Made N/A

## 2021-12-11 ENCOUNTER — Telehealth: Payer: Self-pay | Admitting: Cardiovascular Disease

## 2021-12-11 MED ORDER — ROSUVASTATIN CALCIUM 40 MG PO TABS: 40.0000 mg | ORAL_TABLET | Freq: Every day | ORAL | 1 refills | Status: DC

## 2021-12-11 NOTE — Telephone Encounter (Signed)
*  STAT* If patient is at the pharmacy, call can be transferred to refill team.   1. Which medications need to be refilled? (please list name of each medication and dose if known) rosuvastatin (CRESTOR) 40 MG tablet  2. Which pharmacy/location (including street and city if local pharmacy) is medication to be sent to? [facility] [place], [place]  3. Do they need a 30 day or 90 day supply? [place]

## 2021-12-11 NOTE — Congregational Nurse Program (Signed)
  Dept: [place] Nurse Program Note  Date of Encounter: 12/11/2021  Clinic visit for blood pressure check, BP 138/80, pulse 93, O2 Sat 98%. Expressed concern regarding inability to get prescriptions filled at time when he was there at the pharmacy but refused offer of any assistance from [place]. Past Medical History: Past Medical History:  Diagnosis Date   Allergy    Anxiety    Asthma    CAD, multiple vessel    COPD (chronic obstructive pulmonary disease) ([place])    Depression    Diabetes mellitus without complication (HCC)    Dyspnea    GERD (gastroesophageal reflux disease)    History of chicken pox    Hyperlipidemia    Hypertension    MI (myocardial infarction) ([place])    Prostate enlargement    PTSD (post-traumatic stress disorder)    [place] Vet   Sarcoidosis of lung ([place])     Encounter Details:  CNP Questionnaire - 12/11/21 1245       Questionnaire   Ask client: Do you give verbal consent for me to treat you today? Yes    Student Assistance [employer] Nurse    Location Patient [facility]    Visit Setting with Client Organization    Patient Status Unknown   has own apartment at [facility]    Insurance/Financial Assistance Referral N/A    Medication N/A    Medical Provider Yes    Screening Referrals Made N/A    Medical Referrals Made N/A    Medical Appointment Made N/A    Recently w/o PCP, now 1st time PCP visit completed due to CNs referral or appointment made N/A    Food N/A    Transportation N/A    Housing/Utilities N/A    Interpersonal Safety N/A    Interventions Counsel;Advocate/Support    Abnormal to Normal Screening Since Last CN Visit N/A    Screenings CN Performed Blood Pressure;Pulse Ox    Sent Client to Lab for: N/A    Did client attend any of the following based off CNs referral or appointments made? N/A    ED Visit Averted N/A    Life-Saving Intervention Made N/A

## 2021-12-11 NOTE — Telephone Encounter (Signed)
Requested Prescriptions   Signed Prescriptions Disp Refills   rosuvastatin (CRESTOR) 40 MG tablet 90 tablet 1    Sig: Take 1 tablet (40 mg total) by mouth daily.    Authorizing Provider: Minna Merritts    Ordering User: Raelene Bott, Serina Nichter L

## 2022-02-11 ENCOUNTER — Ambulatory Visit (INDEPENDENT_AMBULATORY_CARE_PROVIDER_SITE_OTHER): Payer: No Typology Code available for payment source | Admitting: Internal Medicine

## 2022-02-11 ENCOUNTER — Ambulatory Visit
Admission: RE | Admit: 2022-02-11 | Discharge: 2022-02-11 | Disposition: A | Discharge status: Home / Self Care | Payer: No Typology Code available for payment source | Attending: Internal Medicine | Admitting: Internal Medicine

## 2022-02-11 ENCOUNTER — Encounter: Payer: Self-pay | Admitting: Internal Medicine

## 2022-02-11 ENCOUNTER — Ambulatory Visit
Admission: RE | Admit: 2022-02-11 | Discharge: 2022-02-11 | Disposition: A | Discharge status: Home / Self Care | Payer: No Typology Code available for payment source | Source: Ambulatory Visit | Attending: Internal Medicine | Admitting: Internal Medicine

## 2022-02-11 VITALS — BP 124/82 | HR 95 | Temp 97.8°F | Resp 18 | Ht 71.0 in | Wt 173.5 lb

## 2022-02-11 DIAGNOSIS — K219 Gastro-esophageal reflux disease without esophagitis: Secondary | ICD-10-CM | POA: Diagnosis not present

## 2022-02-11 DIAGNOSIS — I2510 Atherosclerotic heart disease of native coronary artery without angina pectoris: Secondary | ICD-10-CM | POA: Diagnosis not present

## 2022-02-11 DIAGNOSIS — Z125 Encounter for screening for malignant neoplasm of prostate: Secondary | ICD-10-CM | POA: Diagnosis not present

## 2022-02-11 DIAGNOSIS — E1165 Type 2 diabetes mellitus with hyperglycemia: Secondary | ICD-10-CM

## 2022-02-11 DIAGNOSIS — G8929 Other chronic pain: Secondary | ICD-10-CM

## 2022-02-11 DIAGNOSIS — F4310 Post-traumatic stress disorder, unspecified: Secondary | ICD-10-CM | POA: Diagnosis not present

## 2022-02-11 DIAGNOSIS — M25562 Pain in left knee: Secondary | ICD-10-CM | POA: Diagnosis not present

## 2022-02-11 DIAGNOSIS — Z23 Encounter for immunization: Secondary | ICD-10-CM

## 2022-02-11 DIAGNOSIS — Z1211 Encounter for screening for malignant neoplasm of colon: Secondary | ICD-10-CM

## 2022-02-11 MED ORDER — PANTOPRAZOLE SODIUM 40 MG PO TBEC: 40.0000 mg | DELAYED_RELEASE_TABLET | Freq: Every day | ORAL | 1 refills | Status: DC

## 2022-02-11 MED ORDER — SERTRALINE HCL 50 MG PO TABS: 50.0000 mg | ORAL_TABLET | Freq: Every day | ORAL | 1 refills | Status: DC

## 2022-02-11 MED ORDER — EZETIMIBE 10 MG PO TABS: 10.0000 mg | ORAL_TABLET | Freq: Every day | ORAL | 1 refills | Status: DC

## 2022-02-11 MED ORDER — ROSUVASTATIN CALCIUM 40 MG PO TABS: 40.0000 mg | ORAL_TABLET | Freq: Every day | ORAL | 1 refills | Status: DC

## 2022-02-11 NOTE — Patient Instructions (Addendum)
It was great seeing you today!  Plan discussed at today's visit: -Blood work ordered today, results will be uploaded to [facility].  -Medications refilled, Sertraline decreased to 50 mg -Knee x-ray today -Try to avoid putting anything in your back pockets as this can make sciatica worse, exercises below  -Flu vaccine today  Follow up in: 3 months   Take care and let us know if you have any questions or concerns prior to your next visit.  Dr. Rosana Berger   Sciatica Rehab Ask your health care provider which exercises are safe for you. Do exercises exactly as told by your health care provider and adjust them as directed. It is normal to feel mild stretching, pulling, tightness, or discomfort as you do these exercises. Stop right away if you feel sudden pain or your pain gets worse. Do not begin these exercises until told by your health care provider. Stretching and range-of-motion exercises These exercises warm up your muscles and joints and improve the movement and flexibility of your hips and back. These exercises also help to relieve pain, numbness, and tingling. Sciatic nerve glide  Sit in a chair with your head facing down toward your chest. Place your hands behind your back. Let your shoulders slump forward. Slowly straighten one of your legs while you tilt your head back as if you are looking toward the ceiling. Only straighten your leg as far as you can without making your symptoms worse. Hold this position for __________ seconds. Slowly return your leg and head back to the starting position. Repeat with your other leg. Repeat __________ times. Complete this exercise __________ times a day. Knee to chest with hip adduction and internal rotation  Lie on your back on a firm surface with both legs straight. Bend one of your knees and move it up toward your chest until you feel a gentle stretch in your lower back and buttock. Then, move your knee toward the shoulder that is on the opposite  side from your leg. This is hip adduction and internal rotation. Hold your leg in this position by holding on to the front of your knee. Hold this position for __________ seconds. Slowly return to the starting position. Repeat with your other leg. Repeat __________ times. Complete this exercise __________ times a day. Prone extension on elbows  Lie on your abdomen on a firm surface. A bed may be too soft for this exercise. Prop yourself up on your elbows. Use your arms to help lift your chest up until you feel a gentle stretch in your abdomen and your lower back. This will place some of your body weight on your elbows. If this is uncomfortable, try stacking pillows under your chest. Your hips should stay down, against the surface that you are lying on. Keep your hip and back muscles relaxed. Hold this position for __________ seconds. Slowly relax your upper body and return to the starting position. Repeat __________ times. Complete this exercise __________ times a day. Strengthening exercises These exercises build strength and endurance in your back. Endurance is the ability to use your muscles for a long time, even after they get tired. Pelvic tilt This exercise strengthens the muscles that lie deep in the abdomen. Lie on your back on a firm surface. Bend your knees and keep your feet flat on the surface. Tense your abdominal muscles. Tip your pelvis up toward the ceiling and flatten your lower back into the firm surface. To help with this exercise, you may place a small  towel under your lower back and try to push your back into the towel. Hold this position for __________ seconds. Let your muscles relax completely before you repeat this exercise. Repeat __________ times. Complete this exercise __________ times a day. Alternating arm and leg raises  Get on your hands and knees on a firm surface. If you are on a hard floor, you may want to use padding, such as an exercise mat, to cushion  your knees. Line up your arms and legs. Your hands should be directly below your shoulders, and your knees should be directly below your hips. Lift your left leg behind you. At the same time, raise your right arm and straighten it in front of you. Do not lift your leg higher than your hip. Do not lift your arm higher than your shoulder. Keep your abdominal and back muscles tight. Keep your hips facing the ground. Do not arch your back. Keep your balance carefully, and do not hold your breath. Hold this position for __________ seconds. Slowly return to the starting position. Repeat with your right leg and your left arm. Repeat __________ times. Complete this exercise __________ times a day. Posture and body mechanics Good posture and healthy body mechanics can help to relieve stress in your body's tissues and joints. Body mechanics refers to the movements and positions of your body while you do your daily activities. Posture is part of body mechanics. Good posture means: Your spine is in its natural S-curve position (neutral). Your shoulders are pulled back slightly. Your head is not tipped forward. Follow these guidelines to improve your posture and body mechanics in your everyday activities. Standing  When standing, keep your spine neutral and your feet about hip width apart. Keep a slight bend in your knees. Your ears, shoulders, and hips should line up. When you do a task in which you stand in one place for a long time, place one foot up on a stable object that is 2-4 inches (5-10 cm) high, such as a footstool. This helps keep your spine neutral. Sitting  When sitting, keep your spine neutral and keep your feet flat on the floor. Use a footrest, if necessary, and keep your thighs parallel to the floor. Avoid rounding your shoulders, and avoid tilting your head forward. When working at a desk or a computer, keep your desk at a height where your hands are slightly lower than your elbows.  Slide your chair under your desk so you are close enough to maintain good posture. When working at a computer, place your monitor at a height where you are looking straight ahead and you do not have to tilt your head forward or downward to look at the screen. Resting  When lying down and resting, avoid positions that are most painful for you. If you have pain with activities such as sitting, bending, stooping, or squatting, lie in a position in which your body does not bend very much. For example, avoid curling up on your side with your arms and knees near your chest (fetal position). If you have pain with activities such as standing for a long time or reaching with your arms, lie with your spine in a neutral position and bend your knees slightly. Try the following positions: Lying on your side with a pillow between your knees. Lying on your back with a pillow under your knees. Lifting  When lifting objects, keep your feet at least shoulder width apart and tighten your abdominal muscles. Bend your knees  and hips and keep your spine neutral. It is important to lift using the strength of your legs, not your back. Do not lock your knees straight out. Always ask for help to lift heavy or awkward objects. This information is not intended to replace advice given to you by your health care provider. Make sure you discuss any questions you have with your health care provider. Document Revised: 05/07/2021 Document Reviewed: 05/07/2021 Elsevier Patient Education  [place].

## 2022-02-11 NOTE — Progress Notes (Signed)
New Patient Office Visit  Subjective    Patient ID: Ricky Abbott, male    DOB: 1945/04/05  Age: 77 y.o. MRN: [id number]  CC:  Chief Complaint  Patient presents with   Establish Care    See's cardio, Dr. Rockey Situ   Medication Refill    HPI Ricky Abbott Reas presents to establish care.  Diabetes, Type 2: -Last A1c 6.2 10/22 -Medications: Nothing, has been diet controlled since diagnosed years ago when his blood sugar was >700 -Checking BG at home: no -Diet: eats cereal for breakfast, veggies for lunch and meals on wheels for some dinners. Likes to eat no salt saltine crackers for snack -Eye exam: UTD 7/23 -Foot exam: Due at follow up  -Microalbumin: Due today -Statin: yes -PNA vaccine: Due, discuss at follow up -Denies symptoms of hypoglycemia, polyuria, polydipsia, numbness extremities, foot ulcers/trauma.   HLD/CAD/Hx of MI: -Medications: Crestor 40 mg, Zetia 10 mg, aspirin 81 mg, Nitro PRN -Patient is compliant with above medications and reports no side effects.  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   PTSD: -Mood status: stable -Current treatment: Zoloft 100 mg, would like to decrease dose to 50 mg daily -Satisfied with current treatment?: yes -Duration of current treatment : chronic -Side effects: no Medication compliance: excellent compliance -PTSD from time in [place]     02/11/2022   10:02 AM  03/21/2016    8:15 AM 05/23/2015    9:47 PM 08/15/2013    9:58 AM  Depression screen PHQ 2/9  Decreased Interest 0 1 1 0  Down, Depressed, Hopeless 0 0 2 0  PHQ - 2 Score 0 1 3 0  Altered sleeping 0  2   Tired, decreased energy 0  1   Change in appetite 0  0   Feeling bad or failure about yourself  0  0   Trouble concentrating 0  1   Moving slowly or fidgety/restless 0  0   Suicidal thoughts 0  0   PHQ-9 Score 0  7   Difficult doing work/chores Not difficult at all      Chronic Left Knee Pain: -Has had for years but appears to be worse -Will feel unstable with certain movements, like crossing legs -Occasionally has electric-like pain shooting from his back into his knee - does wear phone in left back pocket  Health Maintenance: -Blood work due -Colon cancer screening: due, last colonoscopy 08/2009 - had one scheduled but had to cancel due to a transportation issue -Flu vaccine due  Outpatient Encounter Medications as of 02/11/2022  Medication Sig   aspirin EC 81 MG EC tablet Take 1 tablet (81 mg total) by mouth daily. Swallow whole.   ezetimibe (ZETIA) 10 MG tablet Take 1 tablet (10 mg total) by mouth daily.   nitroGLYCERIN (NITROSTAT) 0.4 MG SL tablet PLACE 1 TABLET(0.'4MG'$  TOTAL) UNDER THE  TONGUE EVERY 5 MINUTES AS NEEDED FOR CHEST PAIN   pantoprazole (PROTONIX) 40 MG tablet Take 1 tablet (40 mg total) by mouth daily.   rosuvastatin (CRESTOR) 40 MG tablet Take 1 tablet (40 mg total) by mouth daily.   sertraline (ZOLOFT) 100 MG tablet Take 1 tablet (100 mg total) by mouth daily.   [DISCONTINUED] clopidogrel (PLAVIX) 75 MG tablet Take 1 tablet (75 mg total) by mouth daily. (Patient not taking: Reported on 07/30/2021)   [DISCONTINUED] midodrine (PROAMATINE) 2.5 MG tablet Take 1 tablet (2.5 mg total) by mouth 2 (two) times daily with a meal. (Patient not taking: Reported on 07/30/2021)   [DISCONTINUED] tamsulosin (FLOMAX) 0.4 MG CAPS capsule Take 0.4 mg by mouth daily.    Facility-Administered Encounter Medications as of 02/11/2022  Medication   sodium chloride flush (NS) 0.9 % injection 3 mL    Past Medical History:  Diagnosis Date   Allergy    Anxiety    Asthma    CAD, multiple vessel    Cataract    COPD (chronic obstructive pulmonary disease) (HCC)    Depression    Diabetes mellitus without complication (HCC)    Dyspnea    GERD (gastroesophageal reflux disease)    History of chicken pox    Hyperlipidemia    Hypertension    MI (myocardial infarction) ([place])    Prostate enlargement    PTSD (post-traumatic stress disorder)    [place] Vet   Sarcoidosis of lung ([place])     Past Surgical History:  Procedure Laterality Date   billary tube placement     [place] EXTRACTION W/PHACO Right 08/12/2021   Procedure: CATARACT EXTRACTION PHACO AND INTRAOCULAR LENS PLACEMENT ([place]) RIGHT DIABETIC;  Surgeon: Eulogio Bear, MD;  Location: [facility];  Service: Ophthalmology;  Laterality: Right;  NEEDS ARRIVAL AFTER 7AM DUE TO TRANSPORTATION 9.57 00:54.8   CATARACT EXTRACTION W/PHACO Left 08/26/2021   Procedure: CATARACT EXTRACTION PHACO AND INTRAOCULAR LENS PLACEMENT ([place]) LEFT DIABETIC;  Surgeon: Eulogio Bear, MD;  Location: [facility];  Service: Ophthalmology;  Laterality: Left;  7.59 00:50.5   CHOLECYSTECTOMY N/A 06/05/2016   Procedure: LAPAROSCOPIC CHOLECYSTECTOMY with cholangiogram;  Surgeon: Jules Husbands, MD;  Location: [facility];  Service: General;  Laterality: N/A;   CORONARY ARTERY BYPASS GRAFT N/A 11/13/2020   Procedure: CORONARY ARTERY BYPASS GRAFTING (CABG) x 4 ON CARDIOPULMONARY BYPASS USING LIMA AND RIGHT GSV. -LIMA to LAD -SVG to OM -SVG to PDA -SVG to DIAGONAL;  Surgeon: Lajuana Matte, MD;  Location: [facility];  Service: Open Heart Surgery;  Laterality: N/A;   [place], 05/2012   x 2   ENDOVEIN  HARVEST OF GREATER SAPHENOUS VEIN Right 11/13/2020   Procedure: ENDOVEIN HARVEST OF GREATER SAPHENOUS VEIN;  Surgeon: Lajuana Matte, MD;  Location: [facility];  Service: Open Heart Surgery;  Laterality: Right;   EYE SURGERY     IR GENERIC HISTORICAL  04/30/2016   IR PERC CHOLECYSTOSTOMY 04/30/2016 [facility]   LEFT HEART CATH AND CORONARY ANGIOGRAPHY Left 10/06/2019   Procedure: LEFT HEART CATH AND CORONARY ANGIOGRAPHY;  Surgeon: Minna Merritts, MD;  Location: [facility];  Service: Cardiovascular;  Laterality: Left;   LEFT HEART CATH AND CORONARY ANGIOGRAPHY N/A 11/06/2020   Procedure: LEFT HEART CATH AND CORONARY ANGIOGRAPHY;  Surgeon: Nelva Bush, MD;  Location: [facility];  Service: Cardiovascular;  Laterality: N/A;   TEE WITHOUT CARDIOVERSION N/A 11/13/2020   Procedure: TRANSESOPHAGEAL ECHOCARDIOGRAM (TEE);  Surgeon: Lajuana Matte, MD;  Location: [facility];  Service: Open Heart Surgery;  Laterality: N/A;    Family History  Problem Relation Age of Onset   Cancer Mother    Hyperlipidemia Mother    Hypertension Mother    Cancer Father        stomach   Hyperlipidemia Father    Hypertension Father    Cancer Brother        bone cancer    Social History   Socioeconomic History   Marital status: Single    Spouse name: Not on file   Number of children: Not on file   Years of education: Not on file   Highest education level: Not on file  Occupational History   Not on file  Tobacco Use   Smoking status: Former    Packs/day: 4.00    Years: 15.00    Total pack years: 60.00    Types: Cigarettes    Quit date: 08/20/1982    Years since quitting: 39.5   Smokeless tobacco: Never   Tobacco comments:    NONE  Vaping Use   Vaping Use: Never used  Substance and Sexual Activity   Alcohol use: No   Drug use: Never   Sexual activity: Not Currently    Birth control/protection: None    Comment: NONE  Other Topics Concern   Not on file  Social  History Narrative   Yvone Neu grew up in [place], [place]. He moved to the [place] area in 2013. 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   Social Determinants of Health   Financial Resource Strain: Not on file  Food Insecurity: Not on file  Transportation Needs: Not on file  Physical Activity: Not on file  Stress: Not on file  Social Connections: Not on file  Intimate Partner Violence: Not on file    Review of Systems  Constitutional:  Negative for chills and fever.  Eyes:  Negative for blurred vision.  Respiratory:  Negative for shortness of breath.   Cardiovascular:  Negative for chest pain.  Gastrointestinal:  Positive for heartburn. Negative for abdominal pain.  Musculoskeletal:  Positive for joint pain.        Objective    BP 124/82   Pulse 95   Temp 97.8 F (36.6 C)   Resp 18   Ht '5\' 11"'$  (6.063 m)   Wt 173 lb 8 oz (78.7 kg)   SpO2 99%   BMI 24.20 kg/m   Physical Exam Constitutional:      Appearance: Normal appearance.  HENT:     Head: Normocephalic and atraumatic.     Mouth/Throat:     Mouth: Mucous membranes are moist.     Pharynx: Oropharynx is clear.  Eyes:     Conjunctiva/sclera: Conjunctivae normal.  Cardiovascular:     Rate and Rhythm: Normal rate and regular rhythm.  Pulmonary:     Effort: Pulmonary effort is normal.     Breath sounds: Normal breath sounds.  Musculoskeletal:     Right lower leg: No edema.     Left lower leg: No edema.  Skin:    General: Skin is warm and dry.  Neurological:     General: No focal deficit present.     Mental Status: He is alert. Mental status is at baseline.  Psychiatric:        Mood and Affect: Mood  normal.        Behavior: Behavior normal.         Assessment & Plan:   1. Type 2 diabetes mellitus with hyperglycemia, without long-term current use of insulin ([place]): Has been diet controlled, not currently on any medication.  Due for A1c, CBC and CMP as well as urine microalbumin today.  - Hemoglobin A1C -  COMPLETE METABOLIC PANEL WITH GFR - Urine Microalbumin w/creat. ratio - CBC w/Diff/Platelet  2. CAD, multiple vessel: History of CABG in 2022, following with cardiology note from 07/23/2021 reviewed.  Patient is on Crestor 40 mg and Zetia 10 mg as well as aspirin mg daily.  Due for recheck lipid panel.  Medications refilled today.  - Lipid panel - ezetimibe (ZETIA) 10 MG tablet; Take 1 tablet (10 mg total) by mouth daily.  Dispense: 90 tablet; Refill: 1 - rosuvastatin (CRESTOR) 40 MG tablet; Take 1 tablet (40 mg total) by mouth daily.  Dispense: 90 tablet; Refill: 1  3. Gastroesophageal reflux disease, unspecified whether esophagitis present: Symptoms not well-controlled despite taking pantoprazole 40 mg daily.  Overall diet is pretty bland.  Has had EGDs in the past, referral placed to GI both for colon cancer screening and repeat EGD.  - Ambulatory referral to Gastroenterology - pantoprazole (PROTONIX) 40 MG tablet; Take 1 tablet (40 mg total) by mouth daily.  Dispense: 90 tablet; Refill: 1  4. PTSD (post-traumatic stress disorder): Stable, patient requesting decreasing dose to 50 mg daily.  This will be sent to his pharmacy.  Follow-up in 3 months for recheck.  - sertraline (ZOLOFT) 50 MG tablet; Take 1 tablet (50 mg total) by mouth daily.  Dispense: 90 tablet; Refill: 1  5. Chronic pain of left knee: Will obtain x-ray today to rule out arthritis, however symptoms also consistent with left-sided sciatica.  Exercises provided, discussed wearing loosefitting clothing and not putting anything in his back pockets.  - DG Knee Complete 4 Views Left; Future  6. Prostate cancer screening: PSA due.  - PSA  7. Screening for colon cancer: Referral placed.   - Ambulatory referral to Gastroenterology   Return in about 3 months (around 05/13/2022).   Teodora Medici, DO

## 2022-02-12 ENCOUNTER — Ambulatory Visit: Payer: Medicare Other | Admitting: Cardiovascular Disease

## 2022-02-12 LAB — LIPID PANEL
Cholesterol: 129 mg/dL (ref <200)
HDL: 36 mg/dL — ABNORMAL LOW (ref >=40)
LDL Cholesterol (Calc): 74 mg/dL
Non-HDL Cholesterol (Calc): 93 mg/dL (ref <130)
Total CHOL/HDL Ratio: 3.6 (calc) (ref <5.0)
Triglycerides: 104 mg/dL (ref <150)

## 2022-02-12 LAB — CBC WITH DIFFERENTIAL/PLATELET
Absolute Monocytes: 861 {cells}/uL (ref 200–950)
Basophils Absolute: 63 {cells}/uL (ref 0–200)
Basophils Relative: 1.1 %
Eosinophils Absolute: 507 {cells}/uL — ABNORMAL HIGH (ref 15–500)
Eosinophils Relative: 8.9 %
HCT: 46.8 % (ref 38.5–50.0)
Hemoglobin: 15.4 g/dL (ref 13.2–17.1)
Lymphs Abs: 1037 {cells}/uL (ref 850–3900)
MCH: 28.4 pg (ref 27.0–33.0)
MCHC: 32.9 g/dL (ref 32.0–36.0)
MCV: 86.2 fL (ref 80.0–100.0)
MPV: 11.4 fL (ref 7.5–12.5)
Monocytes Relative: 15.1 %
Neutro Abs: 3232 {cells}/uL (ref 1500–7800)
Neutrophils Relative %: 56.7 %
Platelets: 198 Thousand/uL (ref 140–400)
RBC: 5.43 Million/uL (ref 4.20–5.80)
RDW: 13.7 % (ref 11.0–15.0)
Total Lymphocyte: 18.2 %
WBC: 5.7 Thousand/uL (ref 3.8–10.8)

## 2022-02-12 LAB — MICROALBUMIN / CREATININE URINE RATIO
Creatinine, Urine: 244 mg/dL (ref 20–320)
Microalb Creat Ratio: 39 mcg/mg creat — ABNORMAL HIGH (ref <30)
Microalb, Ur: 9.4 mg/dL

## 2022-02-12 LAB — COMPLETE METABOLIC PANEL WITHOUT GFR
AG Ratio: 1.6 (calc) (ref 1.0–2.5)
ALT: 21 U/L (ref 9–46)
AST: 21 U/L (ref 10–35)
Albumin: 4.6 g/dL (ref 3.6–5.1)
Alkaline phosphatase (APISO): 68 U/L (ref 35–144)
BUN: 15 mg/dL (ref 7–25)
CO2: 32 mmol/L (ref 20–32)
Calcium: 9.9 mg/dL (ref 8.6–10.3)
Chloride: 104 mmol/L (ref 98–110)
Creat: 0.99 mg/dL (ref 0.70–1.28)
Globulin: 2.8 g/dL (ref 1.9–3.7)
Glucose, Bld: 111 mg/dL — ABNORMAL HIGH (ref 65–99)
Potassium: 5 mmol/L (ref 3.5–5.3)
Sodium: 144 mmol/L (ref 135–146)
Total Bilirubin: 0.8 mg/dL (ref 0.2–1.2)
Total Protein: 7.4 g/dL (ref 6.1–8.1)
eGFR: 79 mL/min/1.73m2 (ref >=60)

## 2022-02-12 LAB — PSA: PSA: 1.96 ng/mL (ref <=4.00)

## 2022-02-12 LAB — HEMOGLOBIN A1C
Hgb A1c MFr Bld: 6.2 % of total Hgb — ABNORMAL HIGH (ref <5.7)
Mean Plasma Glucose: 131 mg/dL
eAG (mmol/L): 7.3 mmol/L

## 2022-02-13 ENCOUNTER — Telehealth: Payer: Self-pay | Admitting: Internal Medicine

## 2022-02-13 ENCOUNTER — Other Ambulatory Visit: Payer: Self-pay | Admitting: Internal Medicine

## 2022-02-13 DIAGNOSIS — E1165 Type 2 diabetes mellitus with hyperglycemia: Secondary | ICD-10-CM

## 2022-02-13 MED ORDER — DAPAGLIFLOZIN PROPANEDIOL 5 MG PO TABS: 5.0000 mg | ORAL_TABLET | Freq: Every day | ORAL | 2 refills | Status: DC

## 2022-02-13 NOTE — Telephone Encounter (Signed)
Copied from [place] [id number]. Topic: General - Inquiry >> Feb 13, 2022  4:33 PM Rosanne Ashing P wrote: Reason for CCRN: Pt called saying he does not have [facility] or bcbs ins.  He has [employer].   He said he changed that when he came in on the 2nd.  He needs the rx [place] sent to [facility].  It was sent to the wrong pharmacy.

## 2022-02-14 MED ORDER — DAPAGLIFLOZIN PROPANEDIOL 5 MG PO TABS: 5.0000 mg | ORAL_TABLET | Freq: Every day | ORAL | 2 refills | Status: DC

## 2022-02-17 ENCOUNTER — Telehealth: Payer: Self-pay | Admitting: Internal Medicine

## 2022-02-17 NOTE — Telephone Encounter (Signed)
Pt has an appt on Friday 02/21/22

## 2022-02-17 NOTE — Telephone Encounter (Signed)
He needs an appt for hernia workup

## 2022-02-17 NOTE — Telephone Encounter (Signed)
Pt just wanted this to be a FYI    Copied from [place]. Topic: General - Other >> Feb 17, 2022 10:30 AM Sabas Sous wrote: Reason for CRM: Pt called to report that he has a hernia he should have had removed, he believes this could have a bearing on his kidney situation. (Message for PCP)

## 2022-02-17 NOTE — Telephone Encounter (Signed)
I think the kidney situation he is talking about is due to labs, Please advise?

## 2022-02-20 NOTE — Progress Notes (Signed)
   Acute Office Visit  Subjective:     Patient ID: Ricky Abbott, male    DOB: 21-Dec-1945, 77 y.o.   MRN: [id number]  Chief Complaint  Patient presents with   Hernia    Pt states has a hernia on stomach, pt concerned from abnormal labs it could be related.    HPI Patient is in today for hernia.   HERNIA Duration: chronic Location:  abdominal wall , incisional when he had his gallbladder out in 2018  Painful: yes but only with pressure  Discomfort: yes Bulge: yes Quality:  dull and pressure-like Context: not changing Aggravating factors: valsalva, lifting, coughing, and sneezing   Review of Systems  All other systems reviewed and are negative.       Objective:    BP 136/74   Pulse 82   Temp 97.6 F (36.4 C) (Oral)   Resp 16   Ht '5\' 11"'$  (1.803 m)   Wt 180 lb 6.4 oz (81.8 kg)   SpO2 95%   BMI 25.16 kg/m  BP Readings from Last 3 Encounters:  02/21/22 136/74  02/11/22 124/82  12/11/21 138/80   Wt Readings from Last 3 Encounters:  02/21/22 180 lb 6.4 oz (81.8 kg)  02/11/22 173 lb 8 oz (78.7 kg)  10/23/21 160 lb (72.6 kg)      Physical Exam Constitutional:      Appearance: Normal appearance.  HENT:     Head: Normocephalic and atraumatic.  Eyes:     Conjunctiva/sclera: Conjunctivae normal.  Cardiovascular:     Rate and Rhythm: Normal rate and regular rhythm.  Pulmonary:     Effort: Pulmonary effort is normal.     Breath sounds: Normal breath sounds.  Abdominal:     General: There is no distension.     Palpations: Abdomen is soft.     Tenderness: There is no abdominal tenderness. There is no guarding.     Hernia: A hernia is present.     Comments: Large incisional hernia present just below umbilicus   Musculoskeletal:     Right lower leg: No edema.     Left lower leg: No edema.  Skin:    General: Skin is warm and dry.  Neurological:     General: No focal deficit present.     Mental Status: He is alert. Mental status is at baseline.   Psychiatric:        Mood and Affect: Mood normal.        Behavior: Behavior normal.     No results found for any visits on 02/21/22.      Assessment & Plan:   1. Incisional hernia, without obstruction or gangrene: Had been worked up for hernia surgery in 2021 but was having some cardiac issues at the time and it ended up getting canceled. Today he states the hernia has not changed and is only painful when bearing down but he would like to pursue surgery to have it fixed. New referral back to surgery placed today.   - Ambulatory referral to General Surgery  Return for already scheduled .  Teodora Medici, DO

## 2022-02-21 ENCOUNTER — Ambulatory Visit (INDEPENDENT_AMBULATORY_CARE_PROVIDER_SITE_OTHER): Payer: No Typology Code available for payment source | Admitting: Internal Medicine

## 2022-02-21 ENCOUNTER — Encounter: Payer: Self-pay | Admitting: Internal Medicine

## 2022-02-21 VITALS — BP 136/74 | HR 82 | Temp 97.6°F | Resp 16 | Ht 71.0 in | Wt 180.4 lb

## 2022-02-21 DIAGNOSIS — K432 Incisional hernia without obstruction or gangrene: Secondary | ICD-10-CM

## 2022-02-24 ENCOUNTER — Telehealth: Payer: Self-pay

## 2022-02-24 NOTE — Telephone Encounter (Signed)
Patient saw Dr. Vicente Males on 04/05/2019 and he states he canceled his colonoscopy then because he would not find someone to bring him for the colonoscopy. He states he then went and saw [facility] back in 05/23 because his insurance changed and they were in network. He states he got new insurance now and wants to come back to our office because he lives closer to here. Made appointment for patient

## 2022-03-17 ENCOUNTER — Encounter: Payer: Self-pay | Admitting: Surgery

## 2022-03-17 ENCOUNTER — Telehealth: Payer: Self-pay | Admitting: *Deleted

## 2022-03-17 ENCOUNTER — Ambulatory Visit: Payer: Medicare HMO | Admitting: Surgery

## 2022-03-17 ENCOUNTER — Telehealth: Payer: Self-pay | Admitting: Cardiovascular Disease

## 2022-03-17 ENCOUNTER — Telehealth: Payer: Self-pay

## 2022-03-17 ENCOUNTER — Other Ambulatory Visit: Payer: Self-pay

## 2022-03-17 VITALS — BP 173/75 | HR 79 | Temp 97.8°F | Ht 71.0 in | Wt 173.0 lb

## 2022-03-17 DIAGNOSIS — K439 Ventral hernia without obstruction or gangrene: Secondary | ICD-10-CM | POA: Diagnosis not present

## 2022-03-17 DIAGNOSIS — M6208 Separation of muscle (nontraumatic), other site: Secondary | ICD-10-CM | POA: Diagnosis not present

## 2022-03-17 MED ORDER — NITROGLYCERIN 0.4 MG SL SUBL: SUBLINGUAL_TABLET | SUBLINGUAL | 0 refills | Status: DC

## 2022-03-17 NOTE — Telephone Encounter (Signed)
Pt has been schedule for tele pre op appt 03/25/22 @ 9:20. Med rec and consent are done. Pt states he needed a refill for his NTG.     Patient Consent for Virtual Visit        Ricky Abbott has provided verbal consent on 03/17/2022 for a virtual visit (video or telephone).   CONSENT FOR VIRTUAL VISIT FOR:  Ricky Abbott  By participating in this virtual visit I agree to the following:  I hereby voluntarily request, consent and authorize [facility] and its employed or contracted physicians, physician assistants, nurse practitioners or other licensed health care professionals (the Practitioner), to provide me with telemedicine health care services (the "Services") as deemed necessary by the treating Practitioner. I acknowledge and consent to receive the Services by the Practitioner via telemedicine. I understand that the telemedicine visit will involve communicating with the Practitioner through live audiovisual communication technology and the disclosure of certain medical information by electronic transmission. I acknowledge that I have been given the opportunity to request an in-person assessment or other available alternative prior to the telemedicine visit and am voluntarily participating in the telemedicine visit.  I understand that I have the right to withhold or withdraw my consent to the use of telemedicine in the course of my care at any time, without affecting my right to future care or treatment, and that the Practitioner or I may terminate the telemedicine visit at any time. I understand that I have the right to inspect all information obtained and/or recorded in the course of the telemedicine visit and may receive copies of available information for a reasonable fee.  I understand that some of the potential risks of receiving the Services via telemedicine include:  Delay or interruption in medical evaluation due to technological equipment failure or  disruption; Information transmitted may not be sufficient (e.g. poor resolution of images) to allow for appropriate medical decision making by the Practitioner; and/or  In rare instances, security protocols could fail, causing a breach of personal health information.  Furthermore, I acknowledge that it is my responsibility to provide information about my medical history, conditions and care that is complete and accurate to the best of my ability. I acknowledge that Practitioner's advice, recommendations, and/or decision may be based on factors not within their control, such as incomplete or inaccurate data provided by me or distortions of diagnostic images or specimens that may result from electronic transmissions. I understand that the practice of medicine is not an exact science and that Practitioner makes no warranties or guarantees regarding treatment outcomes. I acknowledge that a copy of this consent can be made available to me via my patient portal ([facility]), or I can request a printed copy by calling the office of [facility].    I understand that my insurance will be billed for this visit.   I have read or had this consent read to me. I understand the contents of this consent, which adequately explains the benefits and risks of the Services being provided via telemedicine.  I have been provided ample opportunity to ask questions regarding this consent and the Services and have had my questions answered to my satisfaction. I give my informed consent for the services to be provided through the use of telemedicine in my medical care

## 2022-03-17 NOTE — Patient Instructions (Signed)
Medical / Cardiac Clearances have been faxed to your providers. Please call their offices to see if you need to schedule an appointment.  Our surgery scheduler will call you within 24-48 hours to schedule your surgery. Please have the [facility] surgery sheet available when speaking with her.   Hernia, Adult     A hernia is the bulging of an organ or tissue through a weak spot in the muscles of the abdomen. Hernias develop most often near the belly button (navel) or the area where the leg meets the lower abdomen (groin). Common types of hernias include: Incisional hernia. This type bulges through a scar from an abdominal surgery. Umbilical hernia. This type develops near the navel. Inguinal hernia. This type develops in the groin or scrotum. Femoral hernia. This type develops below the groin, in the upper thigh area. Hiatal hernia. This type occurs when part of the stomach slides above the muscle that separates the abdomen from the chest (diaphragm). What are the causes? This condition may be caused by: Heavy lifting. Coughing over a long period of time. Straining to have a bowel movement. Constipation can lead to straining. An incision made during abdominal surgery. A physical problem that is present at birth (congenital defect). Being overweight or obese. Smoking. Excess fluid in the abdomen. Undescended testicles in males. What are the signs or symptoms? The main symptom is a skin-colored, rounded bulge in the area of the hernia. However, a bulge may not always be present. It may grow bigger or be more visible when you cough or strain (such as when lifting something heavy). A hernia that can be pushed back into the abdomen (is reducible) rarely causes pain. A hernia that cannot be pushed back into the abdomen (is incarcerated) may lose its blood supply (become strangulated). A hernia that is incarcerated may cause: Pain. Fever. Nausea and vomiting. Swelling. Constipation. How is this  diagnosed? A hernia may be diagnosed based on: Your symptoms and medical history. A physical exam. Your health care provider may ask you to cough or move in certain ways to see if the hernia becomes visible. Imaging tests, such as: X-rays. Ultrasound. CT scan. How is this treated? A hernia that is small and painless may not need to be treated. A hernia that is large or painful may be treated with surgery. Inguinal hernias may be treated with surgery to prevent incarceration or strangulation. Strangulated hernias are always treated with surgery because the strangulation causes a lack of blood supply to the trapped organ or tissue. Surgery to treat a hernia involves pushing the bulge back into place and repairing the weak area of the muscle or abdominal wall. Follow these instructions at home: Activity Avoid straining. Do not lift anything that is heavier than 10 lb (4.5 kg), or the limit that you are told, until your health care provider says that it is safe. When lifting heavy objects, lift with your leg muscles, not your back muscles. Preventing constipation Take actions to prevent constipation. Constipation leads to straining with bowel movements, which can make a hernia worse or cause a hernia repair to break down. Your health care provider may recommend that you take these actions to prevent or treat constipation: Drink enough fluid to keep your urine pale yellow. Take over-the-counter or prescription medicines. Eat foods that are high in fiber, such as beans, whole grains, and fresh fruits and vegetables. Limit foods that are high in fat and processed sugars, such as fried or sweet foods. General instructions When  coughing, try to cough gently. You may try to push the hernia back in place by very gently pressing on it while lying down. Do not try to force the bulge back in if it will not push in easily. If you are overweight, work with your health care provider to lose weight  safely. Do not use any products that contain nicotine or tobacco. These products include cigarettes, chewing tobacco, and vaping devices, such as e-cigarettes. If you need help quitting, ask your health care provider. If you are scheduled for hernia repair, watch your hernia for any changes in shape, size, or color. Tell your health care provider about any changes or new symptoms. Take over-the-counter and prescription medicines only as told by your health care provider. Keep all follow-up visits. This is important. Contact a health care provider if: You develop new pain, swelling, or redness around your hernia. You have signs of constipation, such as: Fewer bowel movements in a week than normal. Difficulty having a bowel movement. Stools that are dry, hard, or larger than normal. Get help right away if: You have a fever or chills. You have abdominal pain that gets worse. You feel nauseous or you vomit. You cannot push the hernia back in place by very gently pressing on it while lying down. Do not try to force the bulge back in if it will not go in easily. The hernia: Changes in shape, size, or color. Feels hard or tender. These symptoms may represent a serious problem that is an emergency. Do not wait to see if the symptoms will go away. Get medical help right away. Call your local emergency services (911 in the [place] Do not drive yourself to the hospital. Summary A hernia is the bulging of an organ or tissue through a weak spot in the muscles of the abdomen. The main symptom is a skin-colored bulge in the hernia area. However, a bulge may not always be present. It may grow bigger or more visible when you cough or strain (such as when having a bowel movement). A hernia that is small and painless may not need to be treated. A hernia that is large or painful may be treated with surgery. Surgery to treat a hernia involves pushing the bulge back into place and repairing the weak part of the  abdomen. This information is not intended to replace advice given to you by your health care provider. Make sure you discuss any questions you have with your health care provider. Document Revised: 09/05/2019 Document Reviewed: 09/05/2019 Elsevier Patient Education  [place].

## 2022-03-17 NOTE — Progress Notes (Signed)
03/17/2022  History of Present Illness: Ricky Abbott is a 77 y.o. male presenting for follow up of a supraumbilical incisional hernia and diastasis recti.  The patient was last seen in 2021, at which time we were planning a robotic assisted umbilical hernia repair and plication of diastasis recti.  His hernia defect was about 3 cm, and the diastasis separation was about 7 cm.  He was having cardiac issues and eventually was unable to get cleared for surgery.  He ended up having a CABG x 4 in 11/2020.  He feels better now and reports that he is able to do his daily activities without any shortness of breath.  He has lost a lot of weight after his surgery and is trying to eat healthier as well.  He was worried about his hernia still and he was referred back to discuss his hernia again.  He denies any worsening symptoms and is unsure if it is bigger or not.  Past Medical History: Past Medical History:  Diagnosis Date   Allergy    Anxiety    Asthma    CAD, multiple vessel    Cataract    COPD (chronic obstructive pulmonary disease) (HCC)    Depression    Diabetes mellitus without complication (HCC)    Dyspnea    GERD (gastroesophageal reflux disease)    History of chicken pox    Hyperlipidemia    Hypertension    Medication management 04/28/2016   MI (myocardial infarction) (HCC)    Prostate enlargement    PTSD (post-traumatic stress disorder)    [place] Vet   Sarcoidosis of lung (HCC)      Past Surgical History: Past Surgical History:  Procedure Laterality Date   billary tube placement     CARDIAC CATHETERIZATION     [place]   CARDIAC CATHETERIZATION     [facility]    CATARACT EXTRACTION W/PHACO Right 08/12/2021   Procedure: CATARACT EXTRACTION PHACO AND INTRAOCULAR LENS PLACEMENT (IOC) RIGHT DIABETIC;  Surgeon: King, Bradley Mark, MD;  Location: [facility];  Service: Ophthalmology;  Laterality: Right;  NEEDS ARRIVAL AFTER 7AM DUE TO  TRANSPORTATION 9.57 00:54.8   CATARACT EXTRACTION W/PHACO Left 08/26/2021   Procedure: CATARACT EXTRACTION PHACO AND INTRAOCULAR LENS PLACEMENT (IOC) LEFT DIABETIC;  Surgeon: King, Bradley Mark, MD;  Location: [facility];  Service: Ophthalmology;  Laterality: Left;  7.59 00:50.5   CHOLECYSTECTOMY N/A 06/05/2016   Procedure: LAPAROSCOPIC CHOLECYSTECTOMY with cholangiogram;  Surgeon: Diego F Pabon, MD;  Location: [facility];  Service: General;  Laterality: N/A;   CORONARY ARTERY BYPASS GRAFT N/A 11/13/2020   Procedure: CORONARY ARTERY BYPASS GRAFTING (CABG) x 4 ON CARDIOPULMONARY BYPASS USING LIMA AND RIGHT GSV. -LIMA to LAD -SVG to OM -SVG to PDA -SVG to DIAGONAL;  Surgeon: Lightfoot, Harrell O, MD;  Location: [facility];  Service: Open Heart Surgery;  Laterality: N/A;   CORONARY STENT PLACEMENT  1998, 05/2012   x 2   ENDOVEIN HARVEST OF GREATER SAPHENOUS VEIN Right 11/13/2020   Procedure: ENDOVEIN HARVEST OF GREATER SAPHENOUS VEIN;  Surgeon: Lightfoot, Harrell O, MD;  Location: [facility];  Service: Open Heart Surgery;  Laterality: Right;   EYE SURGERY     IR GENERIC HISTORICAL  04/30/2016   IR PERC CHOLECYSTOSTOMY 04/30/2016 [facility]   LEFT HEART CATH AND CORONARY ANGIOGRAPHY Left 10/06/2019   Procedure: LEFT HEART CATH AND CORONARY ANGIOGRAPHY;  Surgeon: Gollan, Timothy J, MD;  Location: [facility];  Service: Cardiovascular;    Laterality: Left;   LEFT HEART CATH AND CORONARY ANGIOGRAPHY N/A 11/06/2020   Procedure: LEFT HEART CATH AND CORONARY ANGIOGRAPHY;  Surgeon: End, Christopher, MD;  Location: [facility];  Service: Cardiovascular;  Laterality: N/A;   TEE WITHOUT CARDIOVERSION N/A 11/13/2020   Procedure: TRANSESOPHAGEAL ECHOCARDIOGRAM (TEE);  Surgeon: Lightfoot, Harrell O, MD;  Location: [facility];  Service: Open Heart Surgery;  Laterality: N/A;    Home Medications: Prior to Admission medications   Medication Sig Start Date End Date Taking? Authorizing Provider   aspirin EC 81 MG EC tablet Take 1 tablet (81 mg total) by mouth daily. Swallow whole. 11/07/20  Yes Griffith, Kelly A, DO  dapagliflozin propanediol (FARXIGA) 5 MG TABS tablet Take 1 tablet (5 mg total) by mouth daily before breakfast. 02/14/22  Yes Andrews, Elisabeth, DO  ezetimibe (ZETIA) 10 MG tablet Take 1 tablet (10 mg total) by mouth daily. 02/11/22  Yes Andrews, Elisabeth, DO  nitroGLYCERIN (NITROSTAT) 0.4 MG SL tablet PLACE 1 TABLET(0.4MG TOTAL) UNDER THE TONGUE EVERY 5 MINUTES AS NEEDED FOR CHEST PAIN 07/09/21  Yes Gollan, Timothy J, MD  pantoprazole (PROTONIX) 40 MG tablet Take 1 tablet (40 mg total) by mouth daily. 02/11/22  Yes Andrews, Elisabeth, DO  rosuvastatin (CRESTOR) 40 MG tablet Take 1 tablet (40 mg total) by mouth daily. 02/11/22  Yes Andrews, Elisabeth, DO  sertraline (ZOLOFT) 50 MG tablet Take 1 tablet (50 mg total) by mouth daily. 02/11/22  Yes Andrews, Elisabeth, DO    Allergies: Allergies  Allergen Reactions   Peanut-Containing Drug Products Swelling    Swelling, rash, (Peanuts)    Review of Systems: Review of Systems  Constitutional:  Negative for chills and fever.  HENT:  Negative for hearing loss.   Respiratory:  Negative for shortness of breath.   Cardiovascular:  Negative for chest pain.  Gastrointestinal:  Negative for abdominal pain, nausea and vomiting.  Genitourinary:  Negative for dysuria.  Musculoskeletal:  Negative for myalgias.  Skin:  Negative for rash.  Neurological:  Negative for dizziness.  Psychiatric/Behavioral:  Negative for depression.     Physical Exam BP (!) 173/75   Pulse 79   Temp 97.8 F (36.6 C) (Oral)   Ht 5' 11" (1.803 m)   Wt 173 lb (78.5 kg)   SpO2 98%   BMI 24.13 kg/m  CONSTITUTIONAL: No acute distress, well nourished. HEENT:  Normocephalic, atraumatic, extraocular motion intact. NECK:  Trachea is midline, no jugular venous distention. RESPIRATORY:  Lungs are clear, and breath sounds are equal bilaterally. Normal respiratory  effort without pathologic use of accessory muscles. CARDIOVASCULAR: Heart is regular without murmurs, gallops, or rubs. GI: The abdomen is soft, non-distended, non-tender to palpation.  His supraumbilical hernia is overall stable, containing fat, reducible, about 3 cm in size.  With his weight loss, his diastasis recti has significantly recoiled and now the separation is also about 3 cm wide.  MUSCULOSKELETAL:  No peripheral edema, normal gait. NEUROLOGIC:  Motor and sensation is grossly normal.  Cranial nerves are grossly intact. PSYCH:  Alert and oriented to person, place and time. Affect is normal.  Labs/Imaging: Labs from 02/11/22: Na 144, K 5, Cl 104, CO2 32, BUN 15, Cr 0.99.  Total bili 0.8, AST 21, ALT 21, Alk Phos 68.  WBC 5.7, Hgb 15.4, Hct 46.8, Plt 198.  HgA1c 6.2  Assessment and Plan: This is a 76 y.o. male with supraumbilical hernia and diastasis recti.  --Discussed with the patient that his hernia is stable overall, containing fat,   reducible, without evidence of getting bigger or complications.  His main change is the improvement of his diastasis with the significant weight loss that he's had.  Discussed with him that the main aspect of the surgery would now be the hernia repair with some plication of the area above the hernia to allow for better mesh reinforcement.  He is still interested in hernia repair. --Discussed with him the plan for a robotic assisted supraumbilical hernia repair with mesh and reviewed the surgery at length with her including the planned incisions, the risks of bleeding, infection, injury to surrounding structures, that this would be an outpatient surgery, post-operative pain control, activity restrictions, and he's willing to proceed. --Will obtain cardiology clearance and medical clearance.  Ideally would want to stop his Aspirin 5 days prior to surgery if possible. --Will tentatively schedule him for surgery on 04/01/22.  Last dose of Aspirin if able to hold it  would be on 03/26/22. --All of his questions have been answered.  I spent 40 minutes dedicated to the care of this patient on the date of this encounter to include pre-visit review of records, face-to-face time with the patient discussing diagnosis and management, and any post-visit coordination of care.   Tanyon Alipio Luis Damilola Flamm, MD [facility]     

## 2022-03-17 NOTE — Telephone Encounter (Signed)
   Name: Ricky Abbott  DOB: [birth year]  MRN: [id number]  Primary Cardiologist: Ida Rogue, MD   Preoperative team, please contact this patient and set up a phone call appointment for further preoperative risk assessment. Please obtain consent and complete medication review. Thank you for your help.  I confirm that guidance regarding antiplatelet and oral anticoagulation therapy has been completed and, if necessary, noted below: No medications were asked to be held.    Darreld Mclean, PA-C 03/17/2022, 2:53 PM [facility]

## 2022-03-17 NOTE — H&P (View-Only) (Signed)
03/17/2022  History of Present Illness: Ricky Abbott is a 77 y.o. male presenting for follow up of a supraumbilical incisional hernia and diastasis recti.  The patient was last seen in 2021, at which time we were planning a robotic assisted umbilical hernia repair and plication of diastasis recti.  His hernia defect was about 3 cm, and the diastasis separation was about 7 cm.  He was having cardiac issues and eventually was unable to get cleared for surgery.  He ended up having a CABG x 4 in 11/2020.  He feels better now and reports that he is able to do his daily activities without any shortness of breath.  He has lost a lot of weight after his surgery and is trying to eat healthier as well.  He was worried about his hernia still and he was referred back to discuss his hernia again.  He denies any worsening symptoms and is unsure if it is bigger or not.  Past Medical History: Past Medical History:  Diagnosis Date   Allergy    Anxiety    Asthma    CAD, multiple vessel    Cataract    COPD (chronic obstructive pulmonary disease) ([place])    Depression    Diabetes mellitus without complication (HCC)    Dyspnea    GERD (gastroesophageal reflux disease)    History of chicken pox    Hyperlipidemia    Hypertension    Medication management 04/28/2016   MI (myocardial infarction) [facility])    Prostate enlargement    PTSD (post-traumatic stress disorder)    [place] Vet   Sarcoidosis of lung ([place])      Past Surgical History: Past Surgical History:  Procedure Laterality Date   billary tube placement     [place], [place] EXTRACTION W/PHACO Right 08/12/2021   Procedure: CATARACT EXTRACTION PHACO AND INTRAOCULAR LENS PLACEMENT ([place]) RIGHT DIABETIC;  Surgeon: Eulogio Bear, MD;  Location: [place];  Service: Ophthalmology;  Laterality: Right;  NEEDS ARRIVAL AFTER 7AM DUE TO  TRANSPORTATION 9.57 00:54.8   CATARACT EXTRACTION W/PHACO Left 08/26/2021   Procedure: CATARACT EXTRACTION PHACO AND INTRAOCULAR LENS PLACEMENT ([place]) LEFT DIABETIC;  Surgeon: Eulogio Bear, MD;  Location: [place];  Service: Ophthalmology;  Laterality: Left;  7.59 00:50.5   CHOLECYSTECTOMY N/A 06/05/2016   Procedure: LAPAROSCOPIC CHOLECYSTECTOMY with cholangiogram;  Surgeon: Jules Husbands, MD;  Location: [facility];  Service: General;  Laterality: N/A;   CORONARY ARTERY BYPASS GRAFT N/A 11/13/2020   Procedure: CORONARY ARTERY BYPASS GRAFTING (CABG) x 4 ON CARDIOPULMONARY BYPASS USING LIMA AND RIGHT GSV. -LIMA to LAD -SVG to OM -SVG to PDA -SVG to DIAGONAL;  Surgeon: Lajuana Matte, MD;  Location: [place];  Service: Open Heart Surgery;  Laterality: N/A;   [place], 05/2012   x 2   ENDOVEIN HARVEST OF GREATER SAPHENOUS VEIN Right 11/13/2020   Procedure: ENDOVEIN HARVEST OF GREATER SAPHENOUS VEIN;  Surgeon: Lajuana Matte, MD;  Location: [place];  Service: Open Heart Surgery;  Laterality: Right;   EYE SURGERY     IR GENERIC HISTORICAL  04/30/2016   IR PERC CHOLECYSTOSTOMY 04/30/2016 [facility]INTER[facility] RAD   LEFT HEART CATH AND CORONARY ANGIOGRAPHY Left 10/06/2019   Procedure: LEFT HEART CATH AND CORONARY ANGIOGRAPHY;  Surgeon: Minna Merritts, MD;  Location: [place] CV [facility];  Service: Cardiovascular;  Laterality: Left;   LEFT HEART CATH AND CORONARY ANGIOGRAPHY N/A 11/06/2020   Procedure: LEFT HEART CATH AND CORONARY ANGIOGRAPHY;  Surgeon: Nelva Bush, MD;  Location: [place] CV LAB;  Service: Cardiovascular;  Laterality: N/A;   TEE WITHOUT CARDIOVERSION N/A 11/13/2020   Procedure: TRANSESOPHAGEAL ECHOCARDIOGRAM (TEE);  Surgeon: Lajuana Matte, MD;  Location: [place];  Service: Open Heart Surgery;  Laterality: N/A;    Home Medications: Prior to Admission medications   Medication Sig Start Date End Date Taking? Authorizing Provider   aspirin EC 81 MG EC tablet Take 1 tablet (81 mg total) by mouth daily. Swallow whole. 11/07/20  Yes Ezekiel Slocumb, DO  dapagliflozin propanediol (FARXIGA) 5 MG TABS tablet Take 1 tablet (5 mg total) by mouth daily before breakfast. 02/14/22  Yes Teodora Medici, DO  ezetimibe (ZETIA) 10 MG tablet Take 1 tablet (10 mg total) by mouth daily. 02/11/22  Yes Teodora Medici, DO  nitroGLYCERIN (NITROSTAT) 0.4 MG SL tablet PLACE 1 TABLET(0.4MG TOTAL) UNDER THE TONGUE EVERY 5 MINUTES AS NEEDED FOR CHEST PAIN 07/09/21  Yes Gollan, Kathlene November, MD  pantoprazole (PROTONIX) 40 MG tablet Take 1 tablet (40 mg total) by mouth daily. 02/11/22  Yes Teodora Medici, DO  rosuvastatin (CRESTOR) 40 MG tablet Take 1 tablet (40 mg total) by mouth daily. 02/11/22  Yes Teodora Medici, DO  sertraline (ZOLOFT) 50 MG tablet Take 1 tablet (50 mg total) by mouth daily. 02/11/22  Yes Teodora Medici, DO    Allergies: Allergies  Allergen Reactions   Peanut-Containing Drug Products Swelling    Swelling, rash, (Peanuts)    Review of Systems: Review of Systems  Constitutional:  Negative for chills and fever.  HENT:  Negative for hearing loss.   Respiratory:  Negative for shortness of breath.   Cardiovascular:  Negative for chest pain.  Gastrointestinal:  Negative for abdominal pain, nausea and vomiting.  Genitourinary:  Negative for dysuria.  Musculoskeletal:  Negative for myalgias.  Skin:  Negative for rash.  Neurological:  Negative for dizziness.  Psychiatric/Behavioral:  Negative for depression.     Physical Exam BP (!) 173/75   Pulse 79   Temp 97.8 F (36.6 C) (Oral)   Ht 5' 11"$  (1.803 m)   Wt 173 lb (78.5 kg)   SpO2 98%   BMI 24.13 kg/m  CONSTITUTIONAL: No acute distress, well nourished. HEENT:  Normocephalic, atraumatic, extraocular motion intact. NECK:  Trachea is midline, no jugular venous distention. RESPIRATORY:  Lungs are clear, and breath sounds are equal bilaterally. Normal respiratory  effort without pathologic use of accessory muscles. CARDIOVASCULAR: Heart is regular without murmurs, gallops, or rubs. GI: The abdomen is soft, non-distended, non-tender to palpation.  His supraumbilical hernia is overall stable, containing fat, reducible, about 3 cm in size.  With his weight loss, his diastasis recti has significantly recoiled and now the separation is also about 3 cm wide.  MUSCULOSKELETAL:  No peripheral edema, normal gait. NEUROLOGIC:  Motor and sensation is grossly normal.  Cranial nerves are grossly intact. PSYCH:  Alert and oriented to person, place and time. Affect is normal.  Labs/Imaging: Labs from 02/11/22: Na 144, K 5, Cl 104, CO2 32, BUN 15, Cr 0.99.  Total bili 0.8, AST 21, ALT 21, Alk Phos 68.  WBC 5.7, Hgb 15.4, Hct 46.8, Plt 198.  HgA1c 6.2  Assessment and Plan: This is a 78 y.o. male with supraumbilical hernia and diastasis recti.  --Discussed with the patient that his hernia is stable overall, containing fat,  reducible, without evidence of getting bigger or complications.  His main change is the improvement of his diastasis with the significant weight loss that he's had.  Discussed with him that the main aspect of the surgery would now be the hernia repair with some plication of the area above the hernia to allow for better mesh reinforcement.  He is still interested in hernia repair. --Discussed with him the plan for a robotic assisted supraumbilical hernia repair with mesh and reviewed the surgery at length with her including the planned incisions, the risks of bleeding, infection, injury to surrounding structures, that this would be an outpatient surgery, post-operative pain control, activity restrictions, and he's willing to proceed. --Will obtain cardiology clearance and medical clearance.  Ideally would want to stop his Aspirin 5 days prior to surgery if possible. --Will tentatively schedule him for surgery on 04/01/22.  Last dose of Aspirin if able to hold it  would be on 03/26/22. --All of his questions have been answered.  I spent 40 minutes dedicated to the care of this patient on the date of this encounter to include pre-visit review of records, face-to-face time with the patient discussing diagnosis and management, and any post-visit coordination of care.   Melvyn Neth, [facility]

## 2022-03-17 NOTE — Telephone Encounter (Signed)
Cardiac clearance faxed to [place].   Medical clearance faxed to Dr.Eliabeth Andrews.

## 2022-03-17 NOTE — Telephone Encounter (Signed)
   Pre-operative Risk Assessment    Patient Name: Ricky Abbott  DOB: 11/22/1945 MRN: [id number]{       Request for Surgical Clearance    Procedure:   HERNIA SURGERY } Date of Surgery:  Clearance TBD                                Surgeon:  DR Olean Ree Surgeon's Group or Practice Name:  [facility] Phone number:  [phone] Fax number:  [phone] }  Type of Clearance Requested:   - Medical    Type of Anesthesia:  Not Indicated   Additional requests/questions:    Signed, Eli Phillips   03/17/2022, 2:35 PM

## 2022-03-17 NOTE — Telephone Encounter (Signed)
Pt has been schedule for tele pre op appt 03/25/22 @ 9:20. Med rec and consent are done. Pt states he needed a refill for his NTG.

## 2022-03-18 ENCOUNTER — Telehealth: Payer: Self-pay

## 2022-03-18 NOTE — Telephone Encounter (Signed)
Patient has been advised of Pre-Admission date/time, and Surgery date at [facility].  Surgery Date: 04/01/22 Preadmission Testing Date: 03/24/22 (phone 1P-5P)  Patient has been made aware to call [phone], between 1-3:00pm the day before surgery, to find out what time to arrive for surgery.

## 2022-03-19 NOTE — Progress Notes (Addendum)
Established Patient Office Visit  Subjective    Patient ID: Ricky Abbott, male    DOB: [birth year]  Age: 77 y.o. MRN: [id number]  CC:  Chief Complaint  Patient presents with   Procedure    Surgical clerance    HPI Ricky Abbott presents for pre-op exam.   Type of Surgery: Umbilical/Incisional Hernia Repair  Date of Surgery: 04/03/22 Doctor Performing Surgery: Dr. Hampton Abbot  Hx of surgical complications: No complications  Hx of anesthesia complications: No complications History of 2 prior MI's, the first was in 2014 and resulted in 1 cardiac stent placed. The second MI was in 2022 with multi-level CABG. No COPD. Has type 2 diabetes but controlled, A1c 6.2% in 1/24. No tobacco use, current alcohol or illicit drug use.  Revised Cardiac Risk Index for major cardiac event (RCRI): 0.5% probability of perioperative MI, moderate Pulmonary Risk Score (ARISCAT): 13.3% risk of in-hospital postoperative pulmonary complications, moderate MET Score >4 EKG: RBBB Labwork: no abnormalities other than blood glucose 145   Outpatient Encounter Medications as of 03/20/2022  Medication Sig   aspirin EC 81 MG EC tablet Take 1 tablet (81 mg total) by mouth daily. Swallow whole.   dapagliflozin propanediol (FARXIGA) 5 MG TABS tablet Take 1 tablet (5 mg total) by mouth daily before breakfast.   ezetimibe (ZETIA) 10 MG tablet Take 1 tablet (10 mg total) by mouth daily.   nitroGLYCERIN (NITROSTAT) 0.4 MG SL tablet PLACE 1 TABLET(0.4MG TOTAL) UNDER THE TONGUE EVERY 5 MINUTES AS NEEDED FOR CHEST PAIN   pantoprazole (PROTONIX) 40 MG tablet Take 1 tablet (40 mg total) by mouth daily.   rosuvastatin (CRESTOR) 40 MG tablet Take 1 tablet (40 mg total) by mouth daily.   sertraline (ZOLOFT) 50 MG tablet Take 1 tablet (50 mg total) by mouth daily.   Facility-Administered Encounter Medications as of 03/20/2022  Medication   sodium chloride flush (NS) 0.9 % injection 3 mL    Past Medical History:  Diagnosis  Date   Allergy    Anxiety    Asthma    CAD, multiple vessel    Cataract    COPD (chronic obstructive pulmonary disease) ([place])    Depression    Diabetes mellitus without complication (HCC)    Dyspnea    GERD (gastroesophageal reflux disease)    History of chicken pox    Hyperlipidemia    Hypertension    Medication management 04/28/2016   MI (myocardial infarction) [facility])    Prostate enlargement    PTSD (post-traumatic stress disorder)    Ricky Vet   Sarcoidosis of lung ([place])     Past Surgical History:  Procedure Laterality Date   billary tube placement     [place], [place] EXTRACTION W/PHACO Right 08/12/2021   Procedure: CATARACT EXTRACTION PHACO AND INTRAOCULAR LENS PLACEMENT ([place]) RIGHT DIABETIC;  Surgeon: Eulogio Bear, MD;  Location: [place];  Service: Ophthalmology;  Laterality: Right;  NEEDS ARRIVAL AFTER 7AM DUE TO TRANSPORTATION 9.57 00:54.8   CATARACT EXTRACTION W/PHACO Left 08/26/2021   Procedure: CATARACT EXTRACTION PHACO AND INTRAOCULAR LENS PLACEMENT ([place]) LEFT DIABETIC;  Surgeon: Eulogio Bear, MD;  Location: [place];  Service: Ophthalmology;  Laterality: Left;  7.59 00:50.5   CHOLECYSTECTOMY N/A 06/05/2016   Procedure: LAPAROSCOPIC CHOLECYSTECTOMY with cholangiogram;  Surgeon: Jules Husbands, MD;  Location: [facility];  Service: General;  Laterality:  N/A;   CORONARY ARTERY BYPASS GRAFT N/A 11/13/2020   Procedure: CORONARY ARTERY BYPASS GRAFTING (CABG) x 4 ON CARDIOPULMONARY BYPASS USING LIMA AND RIGHT GSV. -LIMA to LAD -SVG to OM -SVG to PDA -SVG to DIAGONAL;  Surgeon: Lajuana Matte, MD;  Location: [place];  Service: Open Heart Surgery;  Laterality: N/A;   [place], 05/2012   x 2   ENDOVEIN HARVEST OF GREATER SAPHENOUS VEIN Right 11/13/2020   Procedure: ENDOVEIN HARVEST OF GREATER SAPHENOUS VEIN;  Surgeon: Lajuana Matte, MD;  Location: [place];  Service: Open Heart Surgery;  Laterality: Right;   EYE SURGERY     IR GENERIC HISTORICAL  04/30/2016   IR PERC CHOLECYSTOSTOMY 04/30/2016 [facility] RAD   LEFT HEART CATH AND CORONARY ANGIOGRAPHY Left 10/06/2019   Procedure: LEFT HEART CATH AND CORONARY ANGIOGRAPHY;  Surgeon: Minna Merritts, MD;  Location: [place] CV LAB;  Service: Cardiovascular;  Laterality: Left;   LEFT HEART CATH AND CORONARY ANGIOGRAPHY N/A 11/06/2020   Procedure: LEFT HEART CATH AND CORONARY ANGIOGRAPHY;  Surgeon: Nelva Bush, MD;  Location: [place] CV [facility];  Service: Cardiovascular;  Laterality: N/A;   TEE WITHOUT CARDIOVERSION N/A 11/13/2020   Procedure: TRANSESOPHAGEAL ECHOCARDIOGRAM (TEE);  Surgeon: Lajuana Matte, MD;  Location: [place];  Service: Open Heart Surgery;  Laterality: N/A;    Family History  Problem Relation Age of Onset   Cancer Mother    Hyperlipidemia Mother    Hypertension Mother    Cancer Father        stomach   Hyperlipidemia Father    Hypertension Father    Cancer Brother        bone cancer    Social History   Socioeconomic History   Marital status: Single    Spouse name: Not on file   Number of children: Not on file   Years of education: Not on file   Highest education level: Not on file  Occupational History   Not on file  Tobacco Use   Smoking status: Former    Packs/day: 4.00    Years: 15.00    Total pack years: 60.00    Types: Cigarettes    Quit date: 08/20/1982    Years since quitting: 39.6   Smokeless tobacco: Never   Tobacco comments:    NONE  Vaping Use   Vaping Use: Never used  Substance and Sexual Activity   Alcohol use: No   Drug use: Never   Sexual activity: Not Currently    Birth control/protection: None    Comment: NONE  Other Topics Concern   Not on file  Social History Narrative   Ricky Abbott grew up in [place], [place]. Ricky Abbott. Retired Administrator.    Social Determinants of Health   Financial Resource Strain: Not on file  Food Insecurity: Not on file  Transportation Needs: Not on file  Physical Activity: Not on file  Stress: Not on file  Social Connections: Not on file  Intimate Partner Violence: Not on file    Review of Systems  Constitutional:  Negative for chills and fever.  Eyes:  Negative for blurred vision.  Respiratory:  Negative for shortness of breath.   Cardiovascular:  Negative for chest pain.  Gastrointestinal:  Negative for abdominal pain.        Objective    BP 128/76   Pulse 87   Temp 97.7 F (36.5 C) (Oral)  Resp 16   Ht 5' 11"$  (1.803 m)   Wt 180 lb 8 oz (81.9 kg)   SpO2 96%   BMI 25.17 kg/m   Physical Exam Constitutional:      Appearance: Normal appearance.  HENT:     Head: Normocephalic and atraumatic.  Eyes:     Conjunctiva/sclera: Conjunctivae normal.  Cardiovascular:     Rate and Rhythm: Normal rate and regular rhythm.  Pulmonary:     Effort: Pulmonary effort is normal.     Breath sounds: Normal breath sounds.  Skin:    General: Skin is warm and dry.  Neurological:     General: No focal deficit present.     Mental Status: He is alert. Mental status is at baseline.  Psychiatric:        Mood and Affect: Mood normal.        Behavior: Behavior normal.         Assessment & Plan:   1. Pre-op evaluation/ Incisional hernia, without obstruction or gangrene: History of two prior MI's the most recent of which was in 2022 and resulted in multi-vessel CABG, for this reason he should be evaluated by Cardiology as well. EKG here today showing mild progression of RBBB compared to that seen on EKG from 07/23/21. Labs without abnormality other than blood glucose 145, A1c in January 6.2%, non-insulin dependent. This patient has a moderate cardiac risk and moderate pulmonary risk and medically can proceed with planned procedure along with Cardiology evaluation.   - Basic Metabolic Panel  (BMET) - CBC w/Diff/Platelet - EKG 12-Lead   Return for already scheduled.   Teodora Medici, DO

## 2022-03-20 ENCOUNTER — Encounter: Payer: Self-pay | Admitting: Internal Medicine

## 2022-03-20 ENCOUNTER — Ambulatory Visit (INDEPENDENT_AMBULATORY_CARE_PROVIDER_SITE_OTHER): Payer: Medicare HMO | Admitting: Internal Medicine

## 2022-03-20 ENCOUNTER — Other Ambulatory Visit: Payer: Self-pay

## 2022-03-20 VITALS — BP 128/76 | HR 87 | Temp 97.7°F | Resp 16 | Ht 71.0 in | Wt 180.5 lb

## 2022-03-20 DIAGNOSIS — K432 Incisional hernia without obstruction or gangrene: Secondary | ICD-10-CM | POA: Diagnosis not present

## 2022-03-20 DIAGNOSIS — Z01818 Encounter for other preprocedural examination: Secondary | ICD-10-CM | POA: Diagnosis not present

## 2022-03-21 LAB — CBC WITH DIFFERENTIAL/PLATELET
Absolute Monocytes: 809 cells/uL (ref 200–950)
Basophils Absolute: 80 cells/uL (ref 0–200)
Basophils Relative: 1.4 %
Eosinophils Absolute: 701 cells/uL — ABNORMAL HIGH (ref 15–500)
Eosinophils Relative: 12.3 %
HCT: 44.4 % (ref 38.5–50.0)
Hemoglobin: 14.6 g/dL (ref 13.2–17.1)
Lymphs Abs: 1026 cells/uL (ref 850–3900)
MCH: 28.1 pg (ref 27.0–33.0)
MCHC: 32.9 g/dL (ref 32.0–36.0)
MCV: 85.4 fL (ref 80.0–100.0)
MPV: 11.7 fL (ref 7.5–12.5)
Monocytes Relative: 14.2 %
Neutro Abs: 3084 cells/uL (ref 1500–7800)
Neutrophils Relative %: 54.1 %
Platelets: 170 10*3/uL (ref 140–400)
RBC: 5.2 10*6/uL (ref 4.20–5.80)
RDW: 13.7 % (ref 11.0–15.0)
Total Lymphocyte: 18 %
WBC: 5.7 10*3/uL (ref 3.8–10.8)

## 2022-03-21 LAB — BASIC METABOLIC PANEL
BUN: 22 mg/dL (ref 7–25)
CO2: 26 mmol/L (ref 20–32)
Calcium: 9.6 mg/dL (ref 8.6–10.3)
Chloride: 104 mmol/L (ref 98–110)
Creat: 1.01 mg/dL (ref 0.70–1.28)
Glucose, Bld: 145 mg/dL — ABNORMAL HIGH (ref 65–99)
Potassium: 4.4 mmol/L (ref 3.5–5.3)
Sodium: 141 mmol/L (ref 135–146)

## 2022-03-24 ENCOUNTER — Telehealth: Payer: Self-pay

## 2022-03-24 ENCOUNTER — Encounter
Admission: RE | Admit: 2022-03-24 | Discharge: 2022-03-24 | Disposition: A | Discharge status: Home / Self Care | Payer: Medicare HMO | Source: Ambulatory Visit | Attending: Surgery | Admitting: Surgery

## 2022-03-24 VITALS — Ht 71.0 in | Wt 172.0 lb

## 2022-03-24 DIAGNOSIS — E1165 Type 2 diabetes mellitus with hyperglycemia: Secondary | ICD-10-CM

## 2022-03-24 NOTE — Progress Notes (Unsigned)
Virtual Visit via Telephone Note   Because of Ricky Abbott's co-morbid illnesses, he is at least at moderate risk for complications without adequate follow up.  This format is felt to be most appropriate for this patient at this time.  The patient did not have access to video technology/had technical difficulties with video requiring transitioning to audio format only (telephone).  All issues noted in this document were discussed and addressed.  No physical exam could be performed with this format.  Please refer to the patient's chart for his consent to telehealth for [facility].  Evaluation Performed:  Preoperative cardiovascular risk assessment _____________   Date:  03/24/2022   Patient ID:  Ricky Abbott, DOB 05/29/1945, MRN [id number] Patient Location:  Home Provider location:   Office  Primary Care Provider:  Teodora Medici, DO Primary Cardiologist:  Ida Rogue, MD  Chief Complaint / Patient Profile   77 y.o. y/o male with a h/o CAD s/p CABG, RBBB, PTSD, DM type II who is pending hernia surgery and presents today for telephonic preoperative cardiovascular risk assessment.  History of Present Illness    Ricky Abbott is a 77 y.o. male who presents via audio/video conferencing for a telehealth visit today.  Pt was last seen in cardiology clinic on 07/23/2021 by Dr. Rockey Situ.  At that time Ricky Abbott was doing well with rare palpitations & tenderness at left lower ribs.  The patient is now pending procedure as outlined above. Since his last visit, he that he is doing well with no new cardiac complaints since his previous visit.  He is compliant with his current medications and denies any adverse reactions.  He denies chest pain, shortness of breath, lower extremity edema, fatigue, palpitations, melena, hematuria, hemoptysis, diaphoresis, weakness, presyncope, syncope, orthopnea, and PND.    No medications were asked to be held.    Past Medical  History    Past Medical History:  Diagnosis Date   Allergy    Anxiety    Asthma    CAD, multiple vessel    Cataract    COPD (chronic obstructive pulmonary disease) ([place])    Depression    Diabetes mellitus without complication (HCC)    Dyspnea    GERD (gastroesophageal reflux disease)    History of chicken pox    Hyperlipidemia    Hypertension    Medication management 04/28/2016   MI (myocardial infarction) [facility])    Prostate enlargement    PTSD (post-traumatic stress disorder)    [place] Vet   Sarcoidosis of lung ([place])    Past Surgical History:  Procedure Laterality Date   billary tube placement     [place], [place] EXTRACTION W/PHACO Right 08/12/2021   Procedure: CATARACT EXTRACTION PHACO AND INTRAOCULAR LENS PLACEMENT ([place]) RIGHT DIABETIC;  Surgeon: Eulogio Bear, MD;  Location: [place];  Service: Ophthalmology;  Laterality: Right;  NEEDS ARRIVAL AFTER 7AM DUE TO TRANSPORTATION 9.57 00:54.8   CATARACT EXTRACTION W/PHACO Left 08/26/2021   Procedure: CATARACT EXTRACTION PHACO AND INTRAOCULAR LENS PLACEMENT ([place]) LEFT DIABETIC;  Surgeon: Eulogio Bear, MD;  Location: [place];  Service: Ophthalmology;  Laterality: Left;  7.59 00:50.5   CHOLECYSTECTOMY N/A 06/05/2016   Procedure: LAPAROSCOPIC CHOLECYSTECTOMY with cholangiogram;  Surgeon: Jules Husbands, MD;  Location: [facility];  Service: General;  Laterality: N/A;   CORONARY ARTERY BYPASS GRAFT N/A  11/13/2020   Procedure: CORONARY ARTERY BYPASS GRAFTING (CABG) x 4 ON CARDIOPULMONARY BYPASS USING LIMA AND RIGHT GSV. -LIMA to LAD -SVG to OM -SVG to PDA -SVG to DIAGONAL;  Surgeon: Lajuana Matte, MD;  Location: [place];  Service: Open Heart Surgery;  Laterality: N/A;   [place], 05/2012   x 2   ENDOVEIN HARVEST OF GREATER SAPHENOUS VEIN Right 11/13/2020   Procedure: ENDOVEIN HARVEST OF  GREATER SAPHENOUS VEIN;  Surgeon: Lajuana Matte, MD;  Location: [place];  Service: Open Heart Surgery;  Laterality: Right;   EYE SURGERY     IR GENERIC HISTORICAL  04/30/2016   IR PERC CHOLECYSTOSTOMY 04/30/2016 [facility]   LEFT HEART CATH AND CORONARY ANGIOGRAPHY Left 10/06/2019   Procedure: LEFT HEART CATH AND CORONARY ANGIOGRAPHY;  Surgeon: Minna Merritts, MD;  Location: [place] [facility];  Service: Cardiovascular;  Laterality: Left;   LEFT HEART CATH AND CORONARY ANGIOGRAPHY N/A 11/06/2020   Procedure: LEFT HEART CATH AND CORONARY ANGIOGRAPHY;  Surgeon: Nelva Bush, MD;  Location: [place] [facility];  Service: Cardiovascular;  Laterality: N/A;   TEE WITHOUT CARDIOVERSION N/A 11/13/2020   Procedure: TRANSESOPHAGEAL ECHOCARDIOGRAM (TEE);  Surgeon: Lajuana Matte, MD;  Location: [place];  Service: Open Heart Surgery;  Laterality: N/A;    Allergies  Allergies  Allergen Reactions   Peanut-Containing Drug Products Swelling    Swelling, rash, (Peanuts)    Home Medications    Prior to Admission medications   Medication Sig Start Date End Date Taking? Authorizing Provider  aspirin EC 81 MG EC tablet Take 1 tablet (81 mg total) by mouth daily. Swallow whole. 11/07/20   Ezekiel Slocumb, DO  dapagliflozin propanediol (FARXIGA) 5 MG TABS tablet Take 1 tablet (5 mg total) by mouth daily before breakfast. 02/14/22   Teodora Medici, DO  ezetimibe (ZETIA) 10 MG tablet Take 1 tablet (10 mg total) by mouth daily. 02/11/22   Teodora Medici, DO  nitroGLYCERIN (NITROSTAT) 0.4 MG SL tablet PLACE 1 TABLET(0.4MG TOTAL) UNDER THE TONGUE EVERY 5 MINUTES AS NEEDED FOR CHEST PAIN 03/17/22   Minna Merritts, MD  pantoprazole (PROTONIX) 40 MG tablet Take 1 tablet (40 mg total) by mouth daily. 02/11/22   Teodora Medici, DO  rosuvastatin (CRESTOR) 40 MG tablet Take 1 tablet (40 mg total) by mouth daily. 02/11/22   Teodora Medici, DO  sertraline (ZOLOFT) 50 MG tablet Take 1 tablet  (50 mg total) by mouth daily. 02/11/22   Teodora Medici, DO    Physical Exam    Vital Signs:  Ricky Abbott does not have vital signs available for review today.  Given telephonic nature of communication, physical exam is limited. AAOx3. NAD. Normal affect.  Speech and respirations are unlabored.  Accessory Clinical Findings    None  Assessment & Plan    1.  Preoperative Cardiovascular Risk Assessment:  Ricky Abbott's perioperative risk of a major cardiac event is 6.6% according to the Revised Cardiac Risk Index (RCRI).  Therefore, he is at high risk for perioperative complications.   His functional capacity is good at 4.31 METs according to the Duke Activity Status Index (DASI). Recommendations: According to ACC/AHA guidelines, no further cardiovascular testing needed.  The patient may proceed to surgery at acceptable risk.   Antiplatelet and/or Anticoagulation Recommendations:  Guidance for ASA discussed with PCP   The patient was advised that if he develops new symptoms prior to surgery to contact our office to arrange for a  follow-up visit, and he verbalized understanding.    Time:   Today, I have spent 6 minutes with the patient with telehealth technology discussing medical history, symptoms, and management plan.     Ricky Abbott, Marissa Nestle, NP  03/24/2022, 8:55 AM

## 2022-03-24 NOTE — Telephone Encounter (Signed)
Received medical clearance from Dr. Rosana Berger. Pt's risk assessment is medium and is recommended further assessment/work from cardiology. Cardiology clearance was faxed on 03/17/2022 and pt has a telephone appt on 03/25/22.

## 2022-03-24 NOTE — Patient Instructions (Addendum)
Your procedure is scheduled on: Tuesday April 01, 2022. Report to the Registration Desk on the 1st floor of the [facility]. To find out your arrival time, please call [phone] between 1PM - 3PM on: Monday March 31, 2022. If your arrival time is 6:00 am, do not arrive before that time as the [place] entrance doors do not open until 6:00 am.  REMEMBER: Instructions that are not followed completely may result in serious medical risk, up to and including death; or upon the discretion of your surgeon and anesthesiologist your surgery may need to be rescheduled.  Do not eat food after midnight the night before surgery.  No gum chewing or hard candies.  You may however, drink CLEAR liquids up to 2 hours before you are scheduled to arrive for your surgery. Do not drink anything within 2 hours of your scheduled arrival time.  Clear liquids include: - water   Type 1 and Type 2 diabetics should only drink water.   One week prior to surgery: Stop Anti-inflammatories (NSAIDS) such as Advil, Aleve, Ibuprofen, Motrin, Naproxen, Naprosyn and Aspirin based products such as Excedrin, Goody's Powder, BC Powder. Stop ANY OVER THE COUNTER supplements until after surgery. You may however, continue to take Tylenol if needed for pain up until the day of surgery.  Continue taking all prescribed medications with the exception of the following: dapagliflozin propanediol (FARXIGA) 5 MG TABS (take last dose 03/28/22)  Stop Aspirin 03/26/22 as instructed by your doctor.   Follow recommendations from Cardiologist or PCP regarding stopping blood thinners.  TAKE ONLY THESE MEDICATIONS THE MORNING OF SURGERY WITH A SIP OF WATER:  ezetimibe (ZETIA) 10 MG  rosuvastatin (CRESTOR) 40 MG  sertraline (ZOLOFT) 50 MG   pantoprazole (PROTONIX) 40 MG Antacid (take one the night before and one on the morning of surgery - helps to prevent nausea after surgery.)    No Alcohol for 24 hours before or after  surgery.  No Smoking including e-cigarettes for 24 hours before surgery.  No chewable tobacco products for at least 6 hours before surgery.  No nicotine patches on the day of surgery.  Do not use any "recreational" drugs for at least a week (preferably 2 weeks) before your surgery.  Please be advised that the combination of cocaine and anesthesia may have negative outcomes, up to and including death. If you test positive for cocaine, your surgery will be cancelled.  On the morning of surgery brush your teeth with toothpaste and water, you may rinse your mouth with mouthwash if you wish. Do not swallow any toothpaste or mouthwash.  Use CHG Soap or wipes as directed on instruction sheet.  Do not wear jewelry, make-up, hairpins, clips or nail polish.  Do not wear lotions, powders, or perfumes.   Do not shave body hair from the neck down 48 hours before surgery.  Contact lenses, hearing aids and dentures may not be worn into surgery.  Do not bring valuables to the hospital. [facility] is not responsible for any missing/lost belongings or valuables.   Total Shoulder Arthroplasty:  use Benzoyl Peroxide 5% Gel as directed on instruction sheet.  Bring your C-PAP to the hospital in case you may have to spend the night.   Notify your doctor if there is any change in your medical condition (cold, fever, infection).  Wear comfortable clothing (specific to your surgery type) to the hospital.  After surgery, you can help prevent lung complications by doing breathing exercises.  Take deep breaths  and cough every 1-2 hours. Your doctor may order a device called an Incentive Spirometer to help you take deep breaths. When coughing or sneezing, hold a pillow firmly against your incision with both hands. This is called "splinting." Doing this helps protect your incision. It also decreases belly discomfort.  If you are being admitted to the hospital overnight, leave your suitcase in the car. After  surgery it may be brought to your room.  In case of increased patient census, it may be necessary for you, the patient, to continue your postoperative care in the Same Day Surgery department.  If you are being discharged the day of surgery, you will not be allowed to drive home. You will need a responsible individual to drive you home and stay with you for 24 hours after surgery.   If you are taking public transportation, you will need to have a responsible individual with you.  Please call the [facility] at [phone] if you have any questions about these instructions.  Surgery Visitation Policy:  Patients undergoing a surgery or procedure may have two family members or support persons with them as long as the person is not COVID-19 positive or experiencing its symptoms.   Inpatient Visitation:    Visiting hours are 7 a.m. to 8 p.m. Up to four visitors are allowed at one time in a patient room. The visitors may rotate out with other people during the day. One designated support person (adult) may remain overnight.  Due to an increase in RSV and influenza rates and associated hospitalizations, children ages 67 and under will not be able to visit patients in [facility]. Masks continue to be strongly recommended.     Preparing for Surgery with CHLORHEXIDINE GLUCONATE (CHG) Soap  Chlorhexidine Gluconate (CHG) Soap  o An antiseptic cleaner that kills germs and bonds with the skin to continue killing germs even after washing  o Used for showering the night before surgery and morning of surgery  Before surgery, you can play an important role by reducing the number of germs on your skin.  CHG (Chlorhexidine gluconate) soap is an antiseptic cleanser which kills germs and bonds with the skin to continue killing germs even after washing.  Please do not use if you have an allergy to CHG or antibacterial soaps. If your skin becomes reddened/irritated stop  using the CHG.  1. Shower the NIGHT BEFORE SURGERY and the MORNING OF SURGERY with CHG soap.  2. If you choose to wash your hair, wash your hair first as usual with your normal shampoo.  3. After shampooing, rinse your hair and body thoroughly to remove the shampoo.  4. Use CHG as you would any other liquid soap. You can apply CHG directly to the skin and wash gently with a scrungie or a clean washcloth.  5. Apply the CHG soap to your body only from the neck down. Do not use on open wounds or open sores. Avoid contact with your eyes, ears, mouth, and genitals (private parts). Wash face and genitals (private parts) with your normal soap.  6. Wash thoroughly, paying special attention to the area where your surgery will be performed.  7. Thoroughly rinse your body with warm water.  8. Do not shower/wash with your normal soap after using and rinsing off the CHG soap.  9. Pat yourself dry with a clean towel.  10. Wear clean pajamas to bed the night before surgery.  12. Place clean sheets on your bed the  night of your first shower and do not sleep with pets.  13. Shower again with the CHG soap on the day of surgery prior to arriving at the hospital.  14. Do not apply any deodorants/lotions/powders.  15. Please wear clean clothes to the hospital.

## 2022-03-25 ENCOUNTER — Ambulatory Visit: Payer: Medicare HMO | Attending: Cardiovascular Disease

## 2022-03-25 ENCOUNTER — Telehealth: Payer: Self-pay

## 2022-03-25 DIAGNOSIS — Z01810 Encounter for preprocedural cardiovascular examination: Secondary | ICD-10-CM

## 2022-03-25 NOTE — Telephone Encounter (Signed)
Cardiac clearance received from Dr.Timothy Gollan-moderate risk without further adequate follow up. tHe patient may proceed with planned procedure at acceptable risk.

## 2022-03-26 ENCOUNTER — Encounter: Payer: Self-pay | Admitting: Surgery

## 2022-03-27 ENCOUNTER — Encounter: Payer: Self-pay | Admitting: Surgery

## 2022-03-27 NOTE — Progress Notes (Signed)
Perioperative / Anesthesia Services  Pre-Admission Testing Clinical Review / Preoperative Anesthesia Consult  Date: 03/31/22  Patient Demographics:  Name: Ricky Abbott DOB:   01/29/1946 MRN:   [id number]  Planned Surgical Procedure(s):    Case: [id number] Date/Time: 04/01/22 1450   Procedure: XI ROBOT ASSISTED UMBILICAL HERNIA REPAIR   Anesthesia type: General   Pre-op diagnosis: Umbilical hernia   Location: [facility] OR ROOM 04 / [place] [facility] FOR ANESTHESIA GROUP   Surgeons: Olean Ree, MD   NOTE: Available PAT nursing documentation and vital signs have been reviewed. Clinical nursing staff has updated patient's PMH/PSHx, current medication list, and drug allergies/intolerances to ensure comprehensive history available to assist in medical decision making as it pertains to the aforementioned surgical procedure and anticipated anesthetic course. Extensive review of available clinical information personally performed. [place] PMH and PSHx updated with any diagnoses/procedures that  may have been inadvertently omitted during his intake with the pre-admission testing department's nursing staff.  Clinical Discussion:  Ricky Abbott is a 77 y.o. male who is submitted for pre-surgical anesthesia review and clearance prior to him undergoing the above procedure. Patient is a Former Smoker (60 pack years; quit 08/1982). Pertinent PMH includes: CAD (s/p CABG), inferior STEMI, NSVT, PSVT, RBBB, murmur, HTN, HLD, T2DM, asthma, COPD, sarcoidosis, GERD (on daily PPI), umbilical hernia, BPH, depression, anxiety, PTSD.  Patient is followed by cardiology Rockey Situ, MD). He was last seen in the cardiology clinic on 07/23/2021; notes reviewed. At the time of his clinic visit, patient doing well overall from a cardiovascular perspective.  Patient reporting some rare episodes of palpitations that were nonsustained in nature. Patient denied any chest pain, shortness of breath, PND, orthopnea, significant  peripheral edema, weakness, fatigue, vertiginous symptoms, or presyncope/syncope. Patient with a past medical history significant for cardiovascular diagnoses. Documented physical exam was grossly benign, providing no evidence of acute exacerbation and/or decompensation of the patient's known cardiovascular conditions.  Of note, complete records regarding patient's full cardiovascular history unavailable for review at time of consult.  Patient has received care in [facility].  Patient underwent diagnostic LEFT heart catheterization in approximately 1999 while living in [place], [place].  Procedure resulted and placement of a stent to the proximal LAD (unknown type).  Patient suffered an inferior wall STEMI on 05/19/2012.  Diagnostic LEFT heart catheterization revealed multivessel CAD; 25% LM, severe diffuse proximal ramus intermedius disease, and 99% stenosis of the ostial RCA.  Additionally, there was mild in-stent restenosis of a previously placed stent to the proximal LAD (unknown type).  PCI subsequently performed placing an Abbott MiniVision BMS x 1 to the ostial RCA yielding excellent angiographic result and TIMI-3 flow.  Diagnostic LEFT heart catheterization was performed on 10/06/2019 revealing multivessel CAD; 70% ostial-proximal LAD, 60% ostial-mid LM, 60% proximal-mid LAD, and 70% OM1.  Decision was made to defer further intervention at that time opting for medical management.  Diagnostic LEFT heart catheterization was performed on 11/06/2020 revealing multivessel CAD; 55% mid-distal LM, 75% ostial LAD, 90% proximal LAD, 70% mid LAD (aneurysm distal to the takeoff of D1), 50% ramus intermedius, 55% proximal LCx, 90% OM1, 30% 2nd LPLB, 70% ostial-proximal RCA, and 50% mid RCA.  Given the complexity and degree of patient's coronary artery disease, patient was referred to [facility] for CVTS consultation regarding revascularization.  Patient ultimately underwent a four-vessel CABG on 11/13/2020.   LIMA-LAD, SVG-OM 1, SVG-PDA, and SVG-D1 bypass grafts were placed.  Most recent TTE was performed on 12/06/2020 revealing a normal left ventricular  systolic function with EF 60 to 65%.  There were no regional wall motion abnormalities. Left ventricular diastolic Doppler parameters consistent with abnormal relaxation (G1DD).  Right ventricular size and function normal.  No significant valvular regurgitation observed.  There was mild to moderate aortic valve sclerosis with no evidence of valvular stenosis noted.  Long-term cardiac event monitor study performed on 12/31/2020 revealing a predominant underlying normal sinus rhythm with an average rate of 87 bpm; range 60-179 bpm.  There was a single 4 beat run of ventricular tachycardia with a maximum rate of 160 bpm.  Additionally, there were 8 episodes of SVT with the fastest and longest interval lasting 15 beats at a maximum rate of 179 bpm.  Atrial and ventricular ectopy rare (<1.0%).  Blood pressure reasonably controlled at 130/64 mmHg without the use of prescribed pharmacological intervention. Patient is on rosuvastatin + ezetimibe for his HLD diagnosis and ASCVD prevention.  Patient has a supply of short acting nitrates (NTG) to use on a as needed basis for recurrent angina/anginal equivalent symptoms; denied recent use. T2DM well controlled on currently prescribed regimen; last HgbA1c was 6.2% when checked on 02/11/2022.  Additionally, in the setting of known cardiovascular disease and concurrent T2DM, patient is on a prescribed SGLT2i (dapagliflozin).  He does not have an OSAH diagnosis.Functional capacity, as defined by DASI, is documented as being >/= 4 METS. No changes were made to his medication regimen during his visit with cardiology.  Patient scheduled to follow-up with outpatient cardiology in 1 year or sooner if needed.  Ricky Abbott is scheduled for an elective XI ROBOT ASSISTED UMBILICAL HERNIA REPAIR on 04/01/2022 with Dr. Ardath Sax,  MD.  Given patient's past medical history significant for cardiovascular diagnoses, presurgical cardiac clearance was sought by the PAT team.  Additionally, surgical team has requested clearance from patient's primary care provider.  Clearances were obtained as follows.    Per internal/family medicine Rosana Berger, DO), "history of two prior MI's, with the most recent of which was in 2022 resulting in multi-vessel CABG. For this reason he should be evaluated by cardiology as well. EKG here today showing mild progression of RBBB compared to that seen on EKG from 07/23/21. Labs without abnormality other than blood glucose 145, A1c in January 6.2%, non-insulin dependent. This patient has a MODERATE cardiac risk and MODERATE pulmonary risk and medically can proceed with planned procedure along with cardiology evaluation".  Per cardiology Barbarann Ehlers, NP-C), "Mr. Levings's perioperative risk of a major cardiac event is 6.6% according to the RCRI therefore, he is at high risk for perioperative complications. His functional capacity is good at 4.31 METs according to the DASI.  Therefore, according to ACC/AHA guidelines, no further cardiovascular testing needed.  The patient may proceed to surgery at an ACCEPTABLE risk".     In review of his medication reconciliation, it is noted that patient is currently on prescribed daily antiplatelet therapy. He has been instructed on recommendations for holding his daily low-dose ASA for 5 days prior to his procedure with plans to restart as soon as postoperative bleeding risk felt to be minimized by his attending surgeon. The patient has been instructed that his last dose of his ASA should be on 03/26/2022.  Patient denies previous perioperative complications with anesthesia in the past. In review of the available records, it is noted that patient underwent a MAC anesthetic course at [facility] ([facility]) in 08/2021 without documented complications.      03/24/2022   11:31  AM  03/20/2022    8:01 AM 03/17/2022    8:28 AM  Vitals with BMI  Height 5' 11"$  5' 11"$  5' 11"$   Weight 172 lbs 180 lbs 8 oz 173 lbs  BMI 24 [id number]  Systolic  0000000 [id number]  Diastolic  76 75  Pulse  87 79    Providers/Specialists:   NOTE: Primary physician provider listed below. Patient may have been seen by APP or partner within same practice.   PROVIDER ROLE / SPECIALTY LAST Delight Stare, MD General Surgery (Surgeon) 03/17/2022  Teodora Medici, DO Primary Care Provider 03/20/2022  Ida Rogue, MD Cardiology 07/23/2021   Allergies:  Peanut-containing drug products  Current Home Medications:   No current facility-administered medications for this encounter.    aspirin EC 81 MG EC tablet   dapagliflozin propanediol (FARXIGA) 5 MG TABS tablet   ezetimibe (ZETIA) 10 MG tablet   nitroGLYCERIN (NITROSTAT) 0.4 MG SL tablet   pantoprazole (PROTONIX) 40 MG tablet   rosuvastatin (CRESTOR) 40 MG tablet   sertraline (ZOLOFT) 50 MG tablet    sodium chloride flush (NS) 0.9 % injection 3 mL   History:   Past Medical History:  Diagnosis Date   Acute ST elevation myocardial infarction (STEMI) of inferior wall (HCC) 05/19/2012   a.) LHC/PCI 05/19/2012: 25% LM, several diff prox RI disease, mild ISR pLAD sttent (unknown type), 99% oRCA (Abbott Mini Vision BMS)   Allergy    Anxiety    Asthma    CAD, multiple vessel    a.) LHC ~1999 in [place] -> stent (unk type) pLAD; b.) LHC/PCI 05/19/12: 25% LM, sev diff Dz pRI, mild ISR pLAD, 99% oRCA (Abbott MiniVision BMS); c.) LHC 10/06/19: 70% o-pLAD, 60% o-mLM, 60% p-mLAD, 70% OM1 -> Rx mgmt; d.) LHC 11/06/20: 55% m-dLM, 75% oLAD, 90% pLAD, 70% mLAD (aneurysm dist D1 takeoff), 50% RI, 55% pLCx, 90% OM1, 30% 2nd LPLB, 70% o-pRCA, 50% mRCA - CVTS consult; e.) 4v CABG 11/13/20   Cataract    COPD (chronic obstructive pulmonary disease) (HCC)    Depression    Dyspnea    GERD (gastroesophageal reflux disease)    History of bilateral cataract  extraction 08/2021   History of chicken pox    Hyperlipidemia    Hypertension    Murmur    Myocardial infarction [facility])    Prostate enlargement    PTSD (post-traumatic stress disorder)    [place] Vet   RBBB (right bundle branch block)    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   a.) LIMA-LAD, SVG-OM, SVG-PDA, SVG-diagnoal   Sarcoidosis of lung (HCC)    T2DM (type 2 diabetes mellitus) ([place])    Umbilical hernia    Past Surgical History:  Procedure Laterality Date   CATARACT EXTRACTION W/PHACO Right 08/12/2021   Procedure: CATARACT EXTRACTION PHACO AND INTRAOCULAR LENS PLACEMENT ([place]) RIGHT DIABETIC;  Surgeon: Eulogio Bear, MD;  Location: [place];  Service: Ophthalmology;  Laterality: Right;  NEEDS ARRIVAL AFTER 7AM DUE TO TRANSPORTATION 9.57 00:54.8   CATARACT EXTRACTION W/PHACO Left 08/26/2021   Procedure: CATARACT EXTRACTION PHACO AND INTRAOCULAR LENS PLACEMENT ([place]) LEFT DIABETIC;  Surgeon: Eulogio Bear, MD;  Location: [place];  Service: Ophthalmology;  Laterality: Left;  7.59 00:50.5   CHOLECYSTECTOMY N/A 06/05/2016   Procedure: LAPAROSCOPIC CHOLECYSTECTOMY with cholangiogram;  Surgeon: Jules Husbands, MD;  Location: [facility] ORS;  Service: General;  Laterality: N/A;   CORONARY ANGIOPLASTY WITH STENT PLACEMENT Left 1999   Procedure: CORONARY ANGIOPLASTY WITH STENT  PLACEMENT; Location: [place], [place]   CORONARY ARTERY BYPASS GRAFT N/A 11/13/2020   Procedure: CORONARY ARTERY BYPASS GRAFTING (CABG) x 4 ON CARDIOPULMONARY BYPASS USING LIMA AND RIGHT GSV. -LIMA to LAD -SVG to OM -SVG to PDA -SVG to DIAGONAL;  Surgeon: Lajuana Matte, MD;  Location: [place];  Service: Open Heart Surgery;  Laterality: N/A;   ENDOVEIN HARVEST OF GREATER SAPHENOUS VEIN Right 11/13/2020   Procedure: ENDOVEIN HARVEST OF GREATER SAPHENOUS VEIN;  Surgeon: Lajuana Matte, MD;  Location: [place];  Service: Open Heart Surgery;  Laterality: Right;   EYE SURGERY     IR GENERIC HISTORICAL   04/30/2016   IR PERC CHOLECYSTOSTOMY 04/30/2016 [facility]-INTERV RAD   LEFT HEART CATH AND CORONARY ANGIOGRAPHY Left 10/06/2019   Procedure: LEFT HEART CATH AND CORONARY ANGIOGRAPHY;  Surgeon: Minna Merritts, MD;  Location: [place] CV LAB;  Service: Cardiovascular;  Laterality: Left;   LEFT HEART CATH AND CORONARY ANGIOGRAPHY N/A 11/06/2020   Procedure: LEFT HEART CATH AND CORONARY ANGIOGRAPHY;  Surgeon: Nelva Bush, MD;  Location: [place] CV LAB;  Service: Cardiovascular;  Laterality: N/A;   TEE WITHOUT CARDIOVERSION N/A 11/13/2020   Procedure: TRANSESOPHAGEAL ECHOCARDIOGRAM (TEE);  Surgeon: Lajuana Matte, MD;  Location: [place];  Service: Open Heart Surgery;  Laterality: N/A;   Family History  Problem Relation Age of Onset   Cancer Mother    Hyperlipidemia Mother    Hypertension Mother    Cancer Father        stomach   Hyperlipidemia Father    Hypertension Father    Cancer Brother        bone cancer   Social History   Tobacco Use   Smoking status: Former    Packs/day: 4.00    Years: 15.00    Total pack years: 60.00    Types: Cigarettes    Quit date: 08/20/1982    Years since quitting: 39.6   Smokeless tobacco: Never   Tobacco comments:    NONE  Vaping Use   Vaping Use: Never used  Substance Use Topics   Alcohol use: No   Drug use: Never    Pertinent Clinical Results:  LABS:   Lab Results  Component Value Date   WBC 5.7 03/20/2022   HGB 14.6 03/20/2022   HCT 44.4 03/20/2022   MCV 85.4 03/20/2022   PLT 170 03/20/2022   Lab Results  Component Value Date   NA 141 03/20/2022   K 4.4 03/20/2022   CO2 26 03/20/2022   GLUCOSE 145 (H) 03/20/2022   BUN 22 03/20/2022   CREATININE 1.01 03/20/2022   CALCIUM 9.6 03/20/2022   EGFR 79 02/11/2022   GFRNONAA >60 12/07/2020    ECG: Date: 03/20/2022 Time ECG obtained: 0822 AM Rate: 70 bpm Rhythm: normal sinus; RBBB Axis (leads I and aVF): Normal Intervals: PR 180 ms. QRS 138 ms. QTc 469 ms. ST  segment and T wave changes: No evidence of acute ST segment elevation or depression Comparison: Progressive RBBB since previous tracing on 07/23/2021   IMAGING / PROCEDURES: TRANSTHORACIC ECHOCARDIOGRAM performed on 12/06/2020 Challenging images Left ventricular ejection fraction, by estimation, is 60 to 65%. The left ventricle has normal function. The left ventricle has no regional wall motion abnormalities. Left ventricular diastolic parameters are consistent with Grade I diastolic dysfunction (impaired relaxation).  Right ventricular systolic function is normal. The right ventricular size is normal. There is normal pulmonary artery systolic pressure.  The mitral valve is normal in structure. No  evidence of mitral valve regurgitation. No evidence of mitral stenosis.  The aortic valve was not well visualized. Aortic valve regurgitation is not visualized. Mild to moderate aortic valve sclerosis/calcification is present, without any evidence of aortic stenosis.  The inferior vena cava is normal in size with greater than 50% respiratory variability, suggesting right atrial pressure of 3 mmHg.   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 50% ramus intermedius 55% proximal  LCx 90% OM1 30% 2nd LPLB 70% ostial proximal RCA; previously treated with stent 50% mid RCA Recommendations Refer to CVTS for consultation regarding revascularization    MYOCARDIAL PERFUSION IMAGING STUDY (LEXISCAN) performed on  09/22/2019 Normal left ventricular systolic function with EF 63% Mild to moderate ischemia in the inferior wall Unable to exclude diaphragmatic attenuation artifact No ECG changes concerning for ischemia at peak stress or in recovery Notes indicated previously noted predominantly fixed perfusion defect in the inferior wall on stress images in 2015 Moderate risk scan.  Clinical correlation recommended.  Could consider alternate form of imaging such as cardiac CTA versus catheterization if symptoms indicate.  Impression and Plan:  Ricky Abbott has been referred for pre-anesthesia review and clearance prior to him undergoing the planned anesthetic and procedural courses. Available labs, pertinent testing, and imaging results were personally reviewed by me in preparation for upcoming operative/procedural course. [facility] medical record has been updated following extensive record review and patient interview with PAT staff.   This patient has been appropriately cleared by internal/family medicine (MODERATE) and by cardiology (ACCEPTABLE) with the individually indicated risk of significant perioperative cardiovascular/cardiopulmonary complications. Based on clinical review performed today (03/31/22), barring any significant acute changes in the patient's overall condition, it is anticipated that he will be able to proceed with the planned surgical intervention. Any acute changes in clinical condition may necessitate his procedure being postponed and/or cancelled. Patient will meet with anesthesia team (MD and/or CRNA) on the day of his procedure for preoperative evaluation/assessment. Questions regarding anesthetic course will be fielded at that time.   Pre-surgical instructions were reviewed with the patient during his PAT appointment, and questions were fielded to satisfaction by PAT clinical staff. He has been instructed on which medications that he will need to hold prior to surgery, as well as  the ones that have been deemed safe/appropriate to take of the day of his procedure. As part of the general education provided by PAT, patient made aware both verbally and in writing, that he would need to abstain from the use of any illegal substances during his perioperative course.  He was advised that failure to follow the provided instructions could necessitate case cancellation or result serious perioperative complications up to and including death. Patient encouraged to contact PAT and/or his surgeon's office to discuss any questions or concerns that may arise prior to surgery; verbalized understanding.   Honor Loh, MSN, APRN, FNP-C, CEN [facility]  Peri-operative Services Nurse Practitioner Phone: [phone] Fax: [phone] 03/31/22 10:08 AM  NOTE: This note has been prepared using Dragon dictation software. Despite my best ability to  proofread, there is always the potential that unintentional transcriptional errors may still occur from this process.

## 2022-03-31 ENCOUNTER — Encounter: Payer: Self-pay | Admitting: Surgery

## 2022-04-01 ENCOUNTER — Ambulatory Visit: Payer: Medicare HMO | Admitting: Urgent Care

## 2022-04-01 ENCOUNTER — Encounter
Admission: RE | Disposition: A | Discharge status: Home / Self Care | Payer: Self-pay | Source: Home / Self Care | Attending: Surgery

## 2022-04-01 ENCOUNTER — Other Ambulatory Visit: Payer: Self-pay

## 2022-04-01 ENCOUNTER — Ambulatory Visit
Admission: RE | Admit: 2022-04-01 | Discharge: 2022-04-01 | Disposition: A | Discharge status: Home / Self Care | Payer: Medicare HMO | Attending: Surgery | Admitting: Surgery

## 2022-04-01 ENCOUNTER — Encounter: Payer: Self-pay | Admitting: Surgery

## 2022-04-01 DIAGNOSIS — Z87891 Personal history of nicotine dependence: Secondary | ICD-10-CM | POA: Insufficient documentation

## 2022-04-01 DIAGNOSIS — F418 Other specified anxiety disorders: Secondary | ICD-10-CM | POA: Diagnosis not present

## 2022-04-01 DIAGNOSIS — K429 Umbilical hernia without obstruction or gangrene: Secondary | ICD-10-CM | POA: Insufficient documentation

## 2022-04-01 DIAGNOSIS — I252 Old myocardial infarction: Secondary | ICD-10-CM | POA: Insufficient documentation

## 2022-04-01 DIAGNOSIS — K439 Ventral hernia without obstruction or gangrene: Secondary | ICD-10-CM | POA: Diagnosis not present

## 2022-04-01 DIAGNOSIS — J449 Chronic obstructive pulmonary disease, unspecified: Secondary | ICD-10-CM | POA: Diagnosis not present

## 2022-04-01 DIAGNOSIS — Z955 Presence of coronary angioplasty implant and graft: Secondary | ICD-10-CM | POA: Diagnosis not present

## 2022-04-01 DIAGNOSIS — I2510 Atherosclerotic heart disease of native coronary artery without angina pectoris: Secondary | ICD-10-CM | POA: Insufficient documentation

## 2022-04-01 DIAGNOSIS — R69 Illness, unspecified: Secondary | ICD-10-CM | POA: Diagnosis not present

## 2022-04-01 DIAGNOSIS — E1165 Type 2 diabetes mellitus with hyperglycemia: Secondary | ICD-10-CM

## 2022-04-01 DIAGNOSIS — I10 Essential (primary) hypertension: Secondary | ICD-10-CM | POA: Insufficient documentation

## 2022-04-01 DIAGNOSIS — K219 Gastro-esophageal reflux disease without esophagitis: Secondary | ICD-10-CM | POA: Diagnosis not present

## 2022-04-01 HISTORY — DX: Umbilical hernia without obstruction or gangrene: K42.9

## 2022-04-01 HISTORY — DX: Acute myocardial infarction, unspecified: I21.9

## 2022-04-01 HISTORY — DX: Type 2 diabetes mellitus without complications: E11.9

## 2022-04-01 HISTORY — PX: INSERTION OF MESH: SHX5868

## 2022-04-01 HISTORY — DX: Cardiac murmur, unspecified: R01.1

## 2022-04-01 HISTORY — DX: Unspecified right bundle-branch block: I45.10

## 2022-04-01 LAB — GLUCOSE, CAPILLARY
Glucose-Capillary: 106 mg/dL — ABNORMAL HIGH (ref 70–99)
Glucose-Capillary: 135 mg/dL — ABNORMAL HIGH (ref 70–99)

## 2022-04-01 SURGERY — REPAIR, HERNIA, UMBILICAL, ROBOT-ASSISTED
Anesthesia: General | Site: Abdomen

## 2022-04-01 MED ORDER — ACETAMINOPHEN 10 MG/ML IV SOLN
INTRAVENOUS | Status: DC | PRN
  Administered 2022-04-01: 1000 mg via INTRAVENOUS

## 2022-04-01 MED ORDER — EPINEPHRINE PF 1 MG/ML IJ SOLN
INTRAMUSCULAR | Status: AC
  Filled 2022-04-01: qty 1

## 2022-04-01 MED ORDER — CHLORHEXIDINE GLUCONATE 0.12 % MT SOLN: 15.0000 mL | Freq: Once | OROMUCOSAL | Status: AC

## 2022-04-01 MED ORDER — KETOROLAC TROMETHAMINE 30 MG/ML IJ SOLN
INTRAMUSCULAR | Status: DC | PRN
  Administered 2022-04-01: 15 mg via INTRAVENOUS

## 2022-04-01 MED ORDER — ACETAMINOPHEN 500 MG PO TABS
ORAL_TABLET | ORAL | Status: AC
  Administered 2022-04-01: 1000 mg via ORAL
  Filled 2022-04-01: qty 2

## 2022-04-01 MED ORDER — PROPOFOL 10 MG/ML IV BOLUS
INTRAVENOUS | Status: AC
  Filled 2022-04-01: qty 40

## 2022-04-01 MED ORDER — CEFAZOLIN SODIUM-DEXTROSE 2-4 GM/100ML-% IV SOLN
INTRAVENOUS | Status: AC
  Filled 2022-04-01: qty 100

## 2022-04-01 MED ORDER — ONDANSETRON HCL 4 MG/2ML IJ SOLN: 4.0000 mg | Freq: Once | INTRAMUSCULAR | Status: DC | PRN

## 2022-04-01 MED ORDER — HYDROMORPHONE HCL 1 MG/ML IJ SOLN
INTRAMUSCULAR | Status: AC
  Filled 2022-04-01: qty 1

## 2022-04-01 MED ORDER — LIDOCAINE HCL (CARDIAC) PF 100 MG/5ML IV SOSY
PREFILLED_SYRINGE | INTRAVENOUS | Status: DC | PRN
  Administered 2022-04-01: 100 mg via INTRAVENOUS

## 2022-04-01 MED ORDER — ORAL CARE MOUTH RINSE: 15.0000 mL | Freq: Once | OROMUCOSAL | Status: AC

## 2022-04-01 MED ORDER — PROPOFOL 10 MG/ML IV BOLUS
INTRAVENOUS | Status: DC | PRN
  Administered 2022-04-01: 130 mg via INTRAVENOUS

## 2022-04-01 MED ORDER — IBUPROFEN 600 MG PO TABS: 600.0000 mg | ORAL_TABLET | Freq: Three times a day (TID) | ORAL | 1 refills | Status: DC | PRN

## 2022-04-01 MED ORDER — CHLORHEXIDINE GLUCONATE 0.12 % MT SOLN
OROMUCOSAL | Status: AC
  Administered 2022-04-01: 15 mL via OROMUCOSAL
  Filled 2022-04-01: qty 15

## 2022-04-01 MED ORDER — PHENYLEPHRINE HCL-NACL 20-0.9 MG/250ML-% IV SOLN
INTRAVENOUS | Status: DC | PRN
  Administered 2022-04-01: 25 ug/min via INTRAVENOUS

## 2022-04-01 MED ORDER — CHLORHEXIDINE GLUCONATE CLOTH 2 % EX PADS
6.0000 | MEDICATED_PAD | Freq: Once | CUTANEOUS | Status: AC
  Administered 2022-04-01: 6 via TOPICAL

## 2022-04-01 MED ORDER — GLYCOPYRROLATE 0.2 MG/ML IJ SOLN
INTRAMUSCULAR | Status: DC | PRN
  Administered 2022-04-01: .2 mg via INTRAVENOUS

## 2022-04-01 MED ORDER — FENTANYL CITRATE (PF) 100 MCG/2ML IJ SOLN: 25.0000 ug | INTRAMUSCULAR | Status: DC | PRN

## 2022-04-01 MED ORDER — LACTATED RINGERS IV SOLN: INTRAVENOUS | Status: DC

## 2022-04-01 MED ORDER — ACETAMINOPHEN 10 MG/ML IV SOLN: 1000.0000 mg | Freq: Once | INTRAVENOUS | Status: DC | PRN

## 2022-04-01 MED ORDER — GABAPENTIN 300 MG PO CAPS: 300.0000 mg | ORAL_CAPSULE | ORAL | Status: AC

## 2022-04-01 MED ORDER — OXYCODONE HCL 5 MG/5ML PO SOLN: 5.0000 mg | Freq: Once | ORAL | Status: DC | PRN

## 2022-04-01 MED ORDER — OXYCODONE HCL 5 MG PO TABS: 5.0000 mg | ORAL_TABLET | ORAL | 0 refills | Status: DC | PRN

## 2022-04-01 MED ORDER — ROCURONIUM BROMIDE 100 MG/10ML IV SOLN
INTRAVENOUS | Status: DC | PRN
  Administered 2022-04-01: 50 mg via INTRAVENOUS
  Administered 2022-04-01: 10 mg via INTRAVENOUS
  Administered 2022-04-01: 20 mg via INTRAVENOUS

## 2022-04-01 MED ORDER — ACETAMINOPHEN 500 MG PO TABS: 1000.0000 mg | ORAL_TABLET | ORAL | Status: AC

## 2022-04-01 MED ORDER — SUGAMMADEX SODIUM 200 MG/2ML IV SOLN
INTRAVENOUS | Status: DC | PRN
  Administered 2022-04-01: 200 mg via INTRAVENOUS

## 2022-04-01 MED ORDER — DEXMEDETOMIDINE HCL IN NACL 80 MCG/20ML IV SOLN
INTRAVENOUS | Status: DC | PRN
  Administered 2022-04-01 (×4): 4 ug via BUCCAL

## 2022-04-01 MED ORDER — BUPIVACAINE-EPINEPHRINE (PF) 0.5% -1:200000 IJ SOLN
INTRAMUSCULAR | Status: DC | PRN
  Administered 2022-04-01: 50 mL

## 2022-04-01 MED ORDER — SODIUM CHLORIDE 0.9 % IV SOLN: INTRAVENOUS | Status: DC

## 2022-04-01 MED ORDER — GABAPENTIN 300 MG PO CAPS
ORAL_CAPSULE | ORAL | Status: AC
  Administered 2022-04-01: 300 mg via ORAL
  Filled 2022-04-01: qty 1

## 2022-04-01 MED ORDER — OXYCODONE HCL 5 MG PO TABS: 5.0000 mg | ORAL_TABLET | Freq: Once | ORAL | Status: DC | PRN

## 2022-04-01 MED ORDER — LACTATED RINGERS IV SOLN: INTRAVENOUS | Status: DC | PRN

## 2022-04-01 MED ORDER — FENTANYL CITRATE (PF) 100 MCG/2ML IJ SOLN
INTRAMUSCULAR | Status: DC | PRN
  Administered 2022-04-01 (×2): 50 ug via INTRAVENOUS

## 2022-04-01 MED ORDER — 0.9 % SODIUM CHLORIDE (POUR BTL) OPTIME
TOPICAL | Status: DC | PRN
  Administered 2022-04-01: 500 mL

## 2022-04-01 MED ORDER — BUPIVACAINE HCL (PF) 0.5 % IJ SOLN
INTRAMUSCULAR | Status: AC
  Filled 2022-04-01: qty 30

## 2022-04-01 MED ORDER — ONDANSETRON HCL 4 MG/2ML IJ SOLN
INTRAMUSCULAR | Status: DC | PRN
  Administered 2022-04-01: 4 mg via INTRAVENOUS

## 2022-04-01 MED ORDER — ACETAMINOPHEN 500 MG PO TABS: 1000.0000 mg | ORAL_TABLET | Freq: Four times a day (QID) | ORAL | Status: AC | PRN

## 2022-04-01 MED ORDER — PROPOFOL 10 MG/ML IV BOLUS
INTRAVENOUS | Status: AC
  Filled 2022-04-01: qty 20

## 2022-04-01 MED ORDER — CEFAZOLIN SODIUM-DEXTROSE 2-4 GM/100ML-% IV SOLN
2.0000 g | INTRAVENOUS | Status: AC
  Administered 2022-04-01: 2 g via INTRAVENOUS

## 2022-04-01 MED ORDER — BUPIVACAINE LIPOSOME 1.3 % IJ SUSP
INTRAMUSCULAR | Status: AC
  Filled 2022-04-01: qty 20

## 2022-04-01 MED ORDER — DEXAMETHASONE SODIUM PHOSPHATE 10 MG/ML IJ SOLN
INTRAMUSCULAR | Status: DC | PRN
  Administered 2022-04-01: 5 mg via INTRAVENOUS

## 2022-04-01 MED ORDER — BUPIVACAINE LIPOSOME 1.3 % IJ SUSP: 20.0000 mL | Freq: Once | INTRAMUSCULAR | Status: DC

## 2022-04-01 MED ORDER — FENTANYL CITRATE (PF) 100 MCG/2ML IJ SOLN
INTRAMUSCULAR | Status: AC
  Filled 2022-04-01: qty 2

## 2022-04-01 MED ORDER — HYDROMORPHONE HCL 1 MG/ML IJ SOLN
INTRAMUSCULAR | Status: DC | PRN
  Administered 2022-04-01 (×2): .5 mg via INTRAVENOUS

## 2022-04-01 SURGICAL SUPPLY — 64 items
BLADE SURG SZ11 CARB STEEL (BLADE) ×1 IMPLANT
CANNULA CAP OBTURATR AIRSEAL 8 (CAP) IMPLANT
CANNULA REDUC XI 12-8 STAPL (CANNULA) ×1
CANNULA REDUCER 12-8 DVNC XI (CANNULA) ×1 IMPLANT
COVER TIP SHEARS 8 DVNC (MISCELLANEOUS) ×1 IMPLANT
COVER TIP SHEARS 8MM DA VINCI (MISCELLANEOUS) ×1
COVER WAND RF STERILE (DRAPES) ×1 IMPLANT
DERMABOND ADVANCED .7 DNX12 (GAUZE/BANDAGES/DRESSINGS) ×1 IMPLANT
DRAPE ARM DVNC X/XI (DISPOSABLE) ×3 IMPLANT
DRAPE COLUMN DVNC XI (DISPOSABLE) ×1 IMPLANT
DRAPE DA VINCI XI ARM (DISPOSABLE) ×3
DRAPE DA VINCI XI COLUMN (DISPOSABLE) ×1
ELECT CAUTERY BLADE TIP 2.5 (TIP) ×1
ELECT REM PT RETURN 9FT ADLT (ELECTROSURGICAL) ×1
ELECTRODE CAUTERY BLDE TIP 2.5 (TIP) ×1 IMPLANT
ELECTRODE REM PT RTRN 9FT ADLT (ELECTROSURGICAL) ×1 IMPLANT
GLOVE SURG SYN 7.0 (GLOVE) ×2 IMPLANT
GLOVE SURG SYN 7.0 PF PI (GLOVE) ×2 IMPLANT
GLOVE SURG SYN 7.5  E (GLOVE) ×2
GLOVE SURG SYN 7.5 E (GLOVE) ×2 IMPLANT
GLOVE SURG SYN 7.5 PF PI (GLOVE) ×2 IMPLANT
GOWN STRL REUS W/ TWL LRG LVL3 (GOWN DISPOSABLE) ×3 IMPLANT
GOWN STRL REUS W/TWL LRG LVL3 (GOWN DISPOSABLE) ×3
GRASPER SUT TROCAR 14GX15 (MISCELLANEOUS) ×1 IMPLANT
IRRIGATION STRYKERFLOW (MISCELLANEOUS) IMPLANT
IRRIGATOR STRYKERFLOW (MISCELLANEOUS)
IV NS 1000ML (IV SOLUTION)
IV NS 1000ML BAXH (IV SOLUTION) IMPLANT
KIT PINK PAD W/HEAD ARE REST (MISCELLANEOUS) ×1
KIT PINK PAD W/HEAD ARM REST (MISCELLANEOUS) ×1 IMPLANT
LABEL OR SOLS (LABEL) ×1 IMPLANT
MANIFOLD NEPTUNE II (INSTRUMENTS) ×1 IMPLANT
MESH VENTRALIGHT ST 4.5 ECHO (Mesh General) IMPLANT
NDL INSUFFLATION 14GA 120MM (NEEDLE) ×1 IMPLANT
NEEDLE HYPO 22GX1.5 SAFETY (NEEDLE) ×1 IMPLANT
NEEDLE INSUFFLATION 14GA 120MM (NEEDLE) ×1 IMPLANT
OBTURATOR OPTICAL STANDARD 8MM (TROCAR) ×1
OBTURATOR OPTICAL STND 8 DVNC (TROCAR) ×1
OBTURATOR OPTICALSTD 8 DVNC (TROCAR) ×1 IMPLANT
PACK LAP CHOLECYSTECTOMY (MISCELLANEOUS) ×1 IMPLANT
PENCIL SMOKE EVACUATOR (MISCELLANEOUS) ×1 IMPLANT
SEAL CANN UNIV 5-8 DVNC XI (MISCELLANEOUS) ×2 IMPLANT
SEAL XI 5MM-8MM UNIVERSAL (MISCELLANEOUS) ×2
SET TUBE FILTERED XL AIRSEAL (SET/KITS/TRAYS/PACK) IMPLANT
SET TUBE SMOKE EVAC HIGH FLOW (TUBING) ×1 IMPLANT
SOL ELECTROSURG ANTI STICK (MISCELLANEOUS) ×1
SOLUTION ELECTROSURG ANTI STCK (MISCELLANEOUS) ×1 IMPLANT
SPONGE T-LAP 18X18 ~~LOC~~+RFID (SPONGE) ×1 IMPLANT
STAPLER CANNULA SEAL DVNC XI (STAPLE) ×1 IMPLANT
STAPLER CANNULA SEAL XI (STAPLE) ×1
SUT MNCRL 4-0 (SUTURE) ×2
SUT MNCRL 4-0 27XMFL (SUTURE) ×2
SUT STRATAFIX PDS 30 CT-1 (SUTURE) ×1 IMPLANT
SUT VIC AB 3-0 SH 27 (SUTURE) ×1
SUT VIC AB 3-0 SH 27X BRD (SUTURE) IMPLANT
SUT VICRYL 0 UR6 27IN ABS (SUTURE) ×2 IMPLANT
SUT VLOC 90 2/L VL 12 GS22 (SUTURE) ×2 IMPLANT
SUTURE MNCRL 4-0 27XMF (SUTURE) ×1 IMPLANT
SYS BAG RETRIEVAL 10MM (BASKET) ×1
SYSTEM BAG RETRIEVAL 10MM (BASKET) IMPLANT
TAPE TRANSPORE STRL 2 31045 (GAUZE/BANDAGES/DRESSINGS) ×1 IMPLANT
TRAP FLUID SMOKE EVACUATOR (MISCELLANEOUS) ×1 IMPLANT
TRAY FOLEY SLVR 16FR LF STAT (SET/KITS/TRAYS/PACK) ×1 IMPLANT
WATER STERILE IRR 500ML POUR (IV SOLUTION) ×1 IMPLANT

## 2022-04-01 NOTE — Interval H&P Note (Signed)
History and Physical Interval Note:  04/01/2022 2:33 PM  Ricky Abbott  has presented today for surgery, with the diagnosis of Umbilical hernia.  The various methods of treatment have been discussed with the patient and family. After consideration of risks, benefits and other options for treatment, the patient has consented to  Procedure(s): XI Radar Base (N/A) as a surgical intervention.  The patient's history has been reviewed, patient examined, no change in status, stable for surgery.  I have reviewed the patient's chart and labs.  Questions were answered to the patient's satisfaction.     Lameshia Hypolite

## 2022-04-01 NOTE — Transfer of Care (Signed)
Immediate Anesthesia Transfer of Care Note  Patient: Ricky Abbott  Procedure(s) Performed: Henreitta Cea ASSISTED UMBILICAL HERNIA REPAIR (Abdomen) INSERTION OF MESH (Abdomen)  Patient Location: PACU  Anesthesia Type:General  Level of Consciousness: awake, alert , and oriented  Airway & Oxygen Therapy: Patient Spontanous Breathing and Patient connected to face mask oxygen  Post-op Assessment: Report given to RN and Post -op Vital signs reviewed and stable  Post vital signs: Reviewed and stable  Last Vitals:  Vitals Value Taken Time  BP 118/49 04/01/22 1926  Temp    Pulse 72 04/01/22 1929  Resp 13 04/01/22 1929  SpO2 98 % 04/01/22 1929  Vitals shown include unvalidated device data.  Last Pain:  Vitals:   04/01/22 1325  TempSrc: Oral         Complications: No notable events documented.

## 2022-04-01 NOTE — Op Note (Signed)
Procedure Date:  04/01/2022  Pre-operative Diagnosis:  Supraumbilical hernia  Post-operative Diagnosis: Umbilical and supraumbilical hernia, total length of 4 cm repaired  Procedure:  Robotic assisted Ventral Hernia Repair with mesh  Surgeon:  Melvyn Neth, MD  Anesthesia:  General endotracheal  Estimated Blood Loss:  10 ml  Specimens:  None  Complications:  None  Indications for Procedure:  This is a 77 y.o. male who presents with a supraumbilical hernia.  The options of surgery versus observation were reviewed with the patient and/or family. The risks of bleeding, abscess or infection, recurrence of symptoms, potential for an open procedure, injury to surrounding structures, and chronic pain were all discussed with the patient and was willing to proceed.  Description of Procedure: The patient was correctly identified in the preoperative area and brought into the operating room.  The patient was placed supine with VTE prophylaxis in place.  Appropriate time-outs were performed.  Anesthesia was induced and the patient was intubated.  Appropriate antibiotics were infused.  The abdomen was prepped and draped in a sterile fashion. The patient's hernia defect was marked with a marking pen.  A Veress needle was introduced in the left upper quadrant and pneumoperitoneum was obtained with appropriate pressures.  Using Optiview technique, an 8 mm port was introduced in the left lateral abdominal wall without complications.  Then, a 12 mm port was introduced in the left upper quadrant and an 8 mm port in the left lower quadrant under direct visualization.  A 4.5 inch Bard Ventralight ST Echo mesh, a 0 Stratafix suture, and three 2-0 V-loc sutures were inserted through the 12 mm port under direct visualization.  The DaVinci platform was docked, camera targeted, and instruments placed under direct visualization.  The patient's hernia contained epiploic fat from the mid transverse colon.  It was  fully reduced and the peritoneum and preperitoneal fat were dissected and resected to allow better exposure of the hernia defect and for better mesh placement.  The patient was noted to have a 2 cm supraumbilical defect, and a 5 mm umbilical hernia defect.  Both combined added to 4 cm length.  The hernia defects were closed using the stratafix suture.  A PMI was brought through the center of the hernia defect and the positioning system of the mesh was passed through.  This allowed the mesh to splay open and be centered over the repair site with good overlap.  The mesh was then sutured in place circumferentially and through the center of the mesh using the V-loc sutures.  All needles and the positioning system were then removed through the 12 mm port without complications.  The preperitoneal fat was placed in an Endocatch bag.  The DaVinci platform was then undocked and instruments removed.    50 ml of Exparel solution mixed with 0.5% bupivacaine with epi was infiltrated around the mesh edges, hernia repair site, and port sites.  The 12 mm port was removed and the endocatch bag retrieved.  The fascia was closed under direct visualization utilizing an Endo Close technique with 0 Vicryl suture.  The 8 mm ports were removed. The 12 mm incision was closed using 3-0 Vicryl and 4-0 Monocryl, and the other port incisions were closed with 4-0 Monocryl.  The wounds were cleaned and sealed with DermaBond.  The patient was emerged from anesthesia and extubated and brought to the recovery room for further management.  The patient tolerated the procedure well and all counts were correct at the end of  the case.   Melvyn Neth, MD

## 2022-04-01 NOTE — Anesthesia Preprocedure Evaluation (Addendum)
Anesthesia Evaluation  Patient identified by MRN, date of birth, ID band Patient awake    Reviewed: Allergy & Precautions, NPO status , Patient's Chart, lab work & pertinent test results  History of Anesthesia Complications Negative for: history of anesthetic complications  Airway Mallampati: II   Neck ROM: Full    Dental  (+) Missing, Loose   Pulmonary asthma , COPD, former smoker (quit 1984) Sarcoidosis    Pulmonary exam normal breath sounds clear to auscultation       Cardiovascular hypertension, + CAD (s/p MI and CABG 2022)  Normal cardiovascular exam Rhythm:Regular Rate:Normal  Echo 12/06/20:  1. Challenging images 2. Left ventricular ejection fraction, by estimation, is 60 to 65%. The left ventricle has normal function. The left ventricle has no regional wall motion abnormalities. Left ventricular diastolic parameters are consistent with Grade I diastolic dysfunction (impaired relaxation).  3. Right ventricular systolic function is normal. The right ventricular size is normal. There is normal pulmonary artery systolic pressure.  4. The mitral valve is normal in structure. No evidence of mitral valve regurgitation. No evidence of mitral stenosis.  5. The aortic valve was not well visualized. Aortic valve regurgitation is not visualized. Mild to moderate aortic valve sclerosis/calcification is present, without any evidence of aortic stenosis.  6. The inferior vena cava is normal in size with greater than 50% respiratory variability, suggesting right atrial pressure of 3 mmHg.   Myocardial perfusion 09/22/19:  1. Normal left ventricular systolic function with EF 63% 2. Mild to moderate ischemia in the inferior wall 3. Unable to exclude diaphragmatic attenuation artifact 4. No ECG changes concerning for ischemia at peak stress or in recovery 5. Notes indicated previously noted predominantly fixed perfusion defect in the inferior  wall on stress images in 2015 6. Moderate risk scan.  Clinical correlation recommended.  Could consider alternate form of imaging such as cardiac CTA versus catheterization if symptoms indicate.     Neuro/Psych  PSYCHIATRIC DISORDERS (PTSD) Anxiety Depression    negative neurological ROS     GI/Hepatic ,GERD  ,,(+) Hepatitis -, C  Endo/Other  diabetes, Type 2    Renal/GU negative Renal ROS     Musculoskeletal  (+) Arthritis ,    Abdominal   Peds  Hematology negative hematology ROS (+)   Anesthesia Other Findings Reviewed and agree with Ricky Abbott pre-anesthesia clinical review note.    Cardiology note 03/25/22:  1.  Preoperative Cardiovascular Risk Assessment:   Ricky Abbott perioperative risk of a major cardiac event is 6.6% according to the Revised Cardiac Risk Index (RCRI).  Therefore, he is at high risk for perioperative complications.   His functional capacity is good at 4.31 METs according to the Duke Activity Status Index (DASI). Recommendations: According to ACC/AHA guidelines, no further cardiovascular testing needed.  The patient may proceed to surgery at acceptable risk.   Cardiology note 07/23/21:  Preop cardiovascular evaluation Acceptable risk for EGD and colonoscopy We will stop the Plavix No further testing needed prior to procedure   CAD, multiple vessel with stable angina Currently with no symptoms of angina. No further workup at this time. Continue current medication regimen. Stay on crestor and zetia for goal LDL <70 Cholesterol at goal, weight trending lower Hold Plavix stay on aspirin   Pure hypercholesterolemia -  Crestor, zetia, numbers at goal Has lab work done through primary care   Essential hypertension -  Blood pressure stable, recommend he stop midodrine For any orthostasis symptoms recommend he take midodrine as  needed Suggest he try to avoid additional weight loss   Claudication in peripheral vascular disease (Ricky Abbott) -  lower  extremity Doppler with minimal disease on the right Minimal carotid disease Reports no claudication symptoms   H/O burning pain in leg History history of neuropathy , previous lower extremity arterial Doppler was essentially normal Reports the burning pain in his legs has improved   PTSD (post-traumatic stress disorder) stable , prior drinker after getting back from [place]   Type 2 diabetes, HbA1c goal < 7% (Ricky Abbott) Managed through primary care   Alcohol abuse Previous history of alcohol abuse.  He reports he no longer drinks   Reproductive/Obstetrics                             Anesthesia Physical Anesthesia Plan  ASA: 3  Anesthesia Plan: General   Post-op Pain Management:    Induction: Intravenous  PONV Risk Score and Plan: 2 and Ondansetron, Dexamethasone and Treatment may vary due to age or medical condition  Airway Management Planned: Oral ETT  Additional Equipment:   Intra-op Plan:   Post-operative Plan: Extubation in OR  Informed Consent: I have reviewed the patients History and Physical, chart, labs and discussed the procedure including the risks, benefits and alternatives for the proposed anesthesia with the patient or authorized representative who has indicated his/her understanding and acceptance.     Dental advisory given  Plan Discussed with: CRNA  Anesthesia Plan Comments: (Patient consented for risks of anesthesia including but not limited to:  - adverse reactions to medications - damage to eyes, teeth, lips or other oral mucosa - nerve damage due to positioning  - sore throat or hoarseness - damage to heart, brain, nerves, lungs, other parts of body or loss of life  Informed patient about role of CRNA in peri- and intra-operative care.  Patient voiced understanding.)        Anesthesia Quick Evaluation

## 2022-04-01 NOTE — Anesthesia Procedure Notes (Signed)
Procedure Name: Intubation Date/Time: 04/01/2022 5:12 PM  Performed by: Otho Perl, CRNAPre-anesthesia Checklist: Patient identified, Patient being monitored, Timeout performed, Emergency Drugs available and Suction available Patient Re-evaluated:Patient Re-evaluated prior to induction Oxygen Delivery Method: Circle system utilized Preoxygenation: Pre-oxygenation with 100% oxygen Induction Type: IV induction Ventilation: Mask ventilation without difficulty Laryngoscope Size: Mac and 3 Grade View: Grade I Tube type: Oral Tube size: 7.0 mm Number of attempts: 1 Airway Equipment and Method: Stylet Placement Confirmation: ETT inserted through vocal cords under direct vision, positive ETCO2 and breath sounds checked- equal and bilateral Secured at: 21 cm Tube secured with: Tape Dental Injury: Teeth and Oropharynx as per pre-operative assessment

## 2022-04-01 NOTE — Anesthesia Postprocedure Evaluation (Signed)
Anesthesia Post Note  Patient: Ricky Abbott  Procedure(s) Performed: XI ROBOT ASSISTED UMBILICAL HERNIA REPAIR (Abdomen) INSERTION OF MESH (Abdomen)  Patient location during evaluation: PACU Anesthesia Type: General Level of consciousness: awake and alert Pain management: pain level controlled Vital Signs Assessment: post-procedure vital signs reviewed and stable Respiratory status: spontaneous breathing, nonlabored ventilation, respiratory function stable and patient connected to nasal cannula oxygen Cardiovascular status: blood pressure returned to baseline and stable Postop Assessment: no apparent nausea or vomiting Anesthetic complications: no  No notable events documented.   Last Vitals:  Vitals:   04/01/22 1926 04/01/22 1930  BP: (!) 118/49 132/63  Pulse: 73 76  Resp: (!) 9 18  Temp: 36.7 C   SpO2: 93% 98%    Last Pain:  Vitals:   04/01/22 1926  TempSrc:   PainSc: 0-No pain                 Dimas Millin

## 2022-04-01 NOTE — Discharge Instructions (Addendum)
Discharge Instructions: 1.  Patient may shower, but do not scrub wounds heavily and dab dry only. 2.  Do not submerge wounds in pool/tub until fully healed. 3.  Do not apply ointments or hydrogen peroxide to the wounds. 4.  May apply ice packs to the wounds for comfort. 5.  May resume your Aspirin on 04/03/22. 6.  Do not drive while taking narcotics for pain control.  Prior to driving, make sure you are able to rotate right and left to look at blindspots without significant pain or discomfort. 7.  No heavy lifting or pushing of more than 10-15 lbs for 6 weeks.  AMBULATORY SURGERY  DISCHARGE INSTRUCTIONS   The drugs that you were given will stay in your system until tomorrow so for the next 24 hours you should not:  Drive an automobile Make any legal decisions Drink any alcoholic beverage   You may resume regular meals tomorrow.  Today it is better to start with liquids and gradually work up to solid foods.  You may eat anything you prefer, but it is better to start with liquids, then soup and crackers, and gradually work up to solid foods.   Please notify your doctor immediately if you have any unusual bleeding, trouble breathing, redness and pain at the surgery site, drainage, fever, or pain not relieved by medication.    Additional Instructions:        Please contact your physician with any problems or Same Day Surgery at [phone], Monday through Friday 6 am to 4 pm, or [place] at [facility] number at [phone].

## 2022-04-02 ENCOUNTER — Encounter: Payer: Self-pay | Admitting: Surgery

## 2022-04-15 ENCOUNTER — Ambulatory Visit: Payer: Medicare HMO | Admitting: Internal Medicine

## 2022-04-18 ENCOUNTER — Encounter: Payer: Self-pay | Admitting: Surgery

## 2022-04-18 ENCOUNTER — Ambulatory Visit (INDEPENDENT_AMBULATORY_CARE_PROVIDER_SITE_OTHER): Payer: Medicare HMO | Admitting: Surgery

## 2022-04-18 VITALS — BP 122/65 | HR 73 | Temp 98.3°F | Ht 71.0 in | Wt 177.0 lb

## 2022-04-18 DIAGNOSIS — Z09 Encounter for follow-up examination after completed treatment for conditions other than malignant neoplasm: Secondary | ICD-10-CM | POA: Diagnosis not present

## 2022-04-18 DIAGNOSIS — K429 Umbilical hernia without obstruction or gangrene: Secondary | ICD-10-CM

## 2022-04-18 DIAGNOSIS — K439 Ventral hernia without obstruction or gangrene: Secondary | ICD-10-CM | POA: Diagnosis not present

## 2022-04-18 NOTE — Patient Instructions (Addendum)
You may keep a loose band aid over any open areas until they no longer are getting irritated by your clothing.   Follow-up with our office as needed.  Please call and ask to speak with a nurse if you develop questions or concerns.   GENERAL POST-OPERATIVE PATIENT INSTRUCTIONS   WOUND CARE INSTRUCTIONS:  Try to keep the wound dry and avoid ointments on the wound unless directed to do so.  If the wound becomes bright red and painful or starts to drain infected material that is not clear, please contact your physician immediately.  If the wound is mildly pink and has a thick firm ridge underneath it, this is normal, and is referred to as a healing ridge.  This will resolve over the next 4-6 weeks.  BATHING: You may shower if you have been informed of this by your surgeon. However, Please do not submerge in a tub, hot tub, or pool until incisions are completely sealed or have been told by your surgeon that you may do so.  DIET:  You may eat any foods that you can tolerate.  It is a good idea to eat a high fiber diet and take in plenty of fluids to prevent constipation.  If you do become constipated you may want to take a mild laxative or take ducolax tablets on a daily basis until your bowel habits are regular.  Constipation can be very uncomfortable, along with straining, after recent surgery.  ACTIVITY:  You should not lift more than 20 pounds for 4-6 weeks total after surgery as it could put you at increased risk for complications. Twenty pounds is roughly equivalent to a plastic bag of groceries. At that time- Listen to your body when lifting, if you have pain when lifting, stop and then try again in a few days. Soreness after doing exercises or activities of daily living is normal as you get back in to your normal routine.  MEDICATIONS:  Try to take narcotic medications and anti-inflammatory medications, such as tylenol, ibuprofen, naprosyn, etc., with food.  This will minimize stomach upset from  the medication.  Should you develop nausea and vomiting from the pain medication, or develop a rash, please discontinue the medication and contact your physician.  You should not drive, make important decisions, or operate machinery when taking narcotic pain medication.  SUNBLOCK Use sun block to incision area over the next year if this area will be exposed to sun. This helps decrease scarring and will allow you avoid a permanent darkened area over your incision.  QUESTIONS:  Please feel free to call our office if you have any questions, and we will be glad to assist you. [phone]

## 2022-04-18 NOTE — Progress Notes (Unsigned)
04/18/2022  History of Present Illness: Ricky Abbott is a 77 y.o. male s/p robotic assisted umbilical and supraumbilical hernia repair on 04/01/22.  Total defect length was 4 cm.  Patient presents for follow up.  Reports he's doing well, with expected soreness at the incisions.  Has been applying a bandaid over the two superior left incisions as his pants rub on those areas.  Past Medical History: Past Medical History:  Diagnosis Date   Acute ST elevation myocardial infarction (STEMI) of inferior wall ([place]) 05/19/2012   a.) LHC/PCI 05/19/2012: 25% LM, severe diff prox RI disease, mild ISR pLAD stent (unknown type), 99% oRCA (Abbott Mini Vision BMS)   Allergy    Anxiety    Asthma    CAD, multiple vessel    a.) LHC ~1999 in [place] -> stent (unk type) pLAD; b.) LHC/PCI 05/19/12: 25% LM, sev diff Dz pRI, mild ISR pLAD, 99% oRCA (Abbott MiniVision BMS); c.) LHC 10/06/19: 70% o-pLAD, 60% o-mLM, 60% p-mLAD, 70% OM1 -> Rx mgmt; d.) LHC 11/06/20: 55% m-dLM, 75% oLAD, 90% pLAD, 70% mLAD (aneurysm dist D1 takeoff), 50% RI, 55% pLCx, 90% OM1, 30% 2nd LPLB, 70% o-pRCA, 50% mRCA - CVTS consult; e.) 4v CABG 11/13/20   Cataract    COPD (chronic obstructive pulmonary disease) (HCC)    Depression    Dyspnea    GERD (gastroesophageal reflux disease)    History of bilateral cataract extraction 08/2021   History of chicken pox    Hyperlipidemia    Hypertension    Murmur    Myocardial infarction [facility])    NSVT (nonsustained ventricular tachycardia) ([place]) 12/31/2020   a.) holter 12/31/2020 --> single 4 beat run at a max rate of 160 bpm   Prostate enlargement    PSVT (paroxysmal supraventricular tachycardia) 12/31/2020   a.) holter 12/31/2020 --> 8 episodes with longest/fastest lasting 15 beats at a maximum rate of 179 bpm   PTSD (post-traumatic stress disorder)    [place] Vet   RBBB (right bundle branch block)    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   a.) LIMA-LAD, SVG-OM1, SVG-PDA, SVG-D1   Sarcoidosis of lung  ([place])    T2DM (type 2 diabetes mellitus) ([place])    Umbilical hernia      Past Surgical History: Past Surgical History:  Procedure Laterality Date   CATARACT EXTRACTION W/PHACO Right 08/12/2021   Procedure: CATARACT EXTRACTION PHACO AND INTRAOCULAR LENS PLACEMENT ([place]) RIGHT DIABETIC;  Surgeon: Eulogio Bear, MD;  Location: [facility];  Service: Ophthalmology;  Laterality: Right;  NEEDS ARRIVAL AFTER 7AM DUE TO TRANSPORTATION 9.57 00:54.8   CATARACT EXTRACTION W/PHACO Left 08/26/2021   Procedure: CATARACT EXTRACTION PHACO AND INTRAOCULAR LENS PLACEMENT ([place]) LEFT DIABETIC;  Surgeon: Eulogio Bear, MD;  Location: [place];  Service: Ophthalmology;  Laterality: Left;  7.59 00:50.5   CHOLECYSTECTOMY N/A 06/05/2016   Procedure: LAPAROSCOPIC CHOLECYSTECTOMY with cholangiogram;  Surgeon: Jules Husbands, MD;  Location: [facility];  Service: General;  Laterality: N/A;   CORONARY ANGIOPLASTY WITH STENT PLACEMENT Left 1999   Procedure: CORONARY ANGIOPLASTY WITH STENT PLACEMENT; Location: [place], [place]   CORONARY ARTERY BYPASS GRAFT N/A 11/13/2020   Procedure: CORONARY ARTERY BYPASS GRAFTING (CABG) x 4 ON CARDIOPULMONARY BYPASS USING LIMA AND RIGHT GSV. -LIMA to LAD -SVG to OM -SVG to PDA -SVG to DIAGONAL;  Surgeon: Lajuana Matte, MD;  Location: [facility];  Service: Open Heart Surgery;  Laterality: N/A;   ENDOVEIN HARVEST OF GREATER SAPHENOUS VEIN Right 11/13/2020  Procedure: ENDOVEIN HARVEST OF GREATER SAPHENOUS VEIN;  Surgeon: Lajuana Matte, MD;  Location: [place];  Service: Open Heart Surgery;  Laterality: Right;   EYE SURGERY     INSERTION OF MESH N/A 04/01/2022   Procedure: INSERTION OF MESH;  Surgeon: Olean Ree, MD;  Location: [facility];  Service: General;  Laterality: N/A;   IR GENERIC HISTORICAL  04/30/2016   IR PERC CHOLECYSTOSTOMY 04/30/2016 [facility]-INTERV RAD   LEFT HEART CATH AND CORONARY ANGIOGRAPHY Left 10/06/2019   Procedure: LEFT HEART CATH AND  CORONARY ANGIOGRAPHY;  Surgeon: Minna Merritts, MD;  Location: [facility];  Service: Cardiovascular;  Laterality: Left;   LEFT HEART CATH AND CORONARY ANGIOGRAPHY N/A 11/06/2020   Procedure: LEFT HEART CATH AND CORONARY ANGIOGRAPHY;  Surgeon: Nelva Bush, MD;  Location: [facility] [place] [facility];  Service: Cardiovascular;  Laterality: N/A;   TEE WITHOUT CARDIOVERSION N/A 11/13/2020   Procedure: TRANSESOPHAGEAL ECHOCARDIOGRAM (TEE);  Surgeon: Lajuana Matte, MD;  Location: [place];  Service: Open Heart Surgery;  Laterality: N/A;    Home Medications: Prior to Admission medications   Medication Sig Start Date End Date Taking? Authorizing Provider  acetaminophen (TYLENOL) 500 MG tablet Take 2 tablets (1,000 mg total) by mouth every 6 (six) hours as needed for mild pain. 04/01/22  Yes Jeffery Gammell, Jacqulyn Bath, MD  aspirin EC 81 MG EC tablet Take 1 tablet (81 mg total) by mouth daily. Swallow whole. 11/07/20  Yes Ezekiel Slocumb, DO  dapagliflozin propanediol (FARXIGA) 5 MG TABS tablet Take 1 tablet (5 mg total) by mouth daily before breakfast. 02/14/22  Yes Teodora Medici, DO  ezetimibe (ZETIA) 10 MG tablet Take 1 tablet (10 mg total) by mouth daily. 02/11/22  Yes Teodora Medici, DO  nitroGLYCERIN (NITROSTAT) 0.4 MG SL tablet PLACE 1 TABLET(0.'4MG'$  TOTAL) UNDER THE TONGUE EVERY 5 MINUTES AS NEEDED FOR CHEST PAIN 03/17/22  Yes Gollan, Kathlene November, MD  pantoprazole (PROTONIX) 40 MG tablet Take 1 tablet (40 mg total) by mouth daily. 02/11/22  Yes Teodora Medici, DO  rosuvastatin (CRESTOR) 40 MG tablet Take 1 tablet (40 mg total) by mouth daily. 02/11/22  Yes Teodora Medici, DO  sertraline (ZOLOFT) 50 MG tablet Take 1 tablet (50 mg total) by mouth daily. 02/11/22  Yes Teodora Medici, DO    Allergies: Allergies  Allergen Reactions   Peanut-Containing Drug Products Swelling    Swelling, rash, (Peanuts)    Review of Systems: Review of Systems  Constitutional:  Negative for chills and  fever.  Respiratory:  Negative for shortness of breath.   Cardiovascular:  Negative for chest pain.  Gastrointestinal:  Negative for abdominal pain, nausea and vomiting.    Physical Exam BP 122/65   Pulse 73   Temp 98.3 F (36.8 C)   Ht '5\' 11"'$  (1.803 m)   Wt 177 lb (80.3 kg)   SpO2 98%   BMI 24.69 kg/m  CONSTITUTIONAL: No acute distress HEENT:  Normocephalic, atraumatic, extraocular motion intact. RESPIRATORY:  Normal respiratory effort without pathologic use of accessory muscles. CARDIOVASCULAR: Regular rhythm and rate. GI: The abdomen is soft, non-distended, non-tender.  Incisions healing well, clean, dry, intact.  No evidence of hernia recurrence.  New BandAids applied.  NEUROLOGIC:  Motor and sensation is grossly normal.  Cranial nerves are grossly intact. PSYCH:  Alert and oriented to person, place and time. Affect is normal.   Assessment and Plan: This is a 77 y.o. male s/p robotic assisted umbilical and supraumbilical hernia repair.  --Patient is doing well,  healing appropriately. Incisions are intact, BandAids are more for support/comfort.  Continue as needed. --Reminded of activity restrictions. --Follow up as needed.  I spent 20 minutes dedicated to the care of this patient on the date of this encounter to include pre-visit review of records, face-to-face time with the patient discussing diagnosis and management, and any post-visit coordination of care.   Melvyn Neth, [facility]

## 2022-04-28 ENCOUNTER — Ambulatory Visit: Payer: Self-pay | Admitting: *Deleted

## 2022-04-28 ENCOUNTER — Other Ambulatory Visit: Payer: Self-pay | Admitting: Internal Medicine

## 2022-04-28 DIAGNOSIS — E1165 Type 2 diabetes mellitus with hyperglycemia: Secondary | ICD-10-CM

## 2022-04-28 MED ORDER — DAPAGLIFLOZIN PROPANEDIOL 5 MG PO TABS: 5.0000 mg | ORAL_TABLET | Freq: Every day | ORAL | 1 refills | Status: DC

## 2022-04-28 NOTE — Telephone Encounter (Signed)
Message from Bayard Beaver sent at 04/28/2022  2:08 PM EDT  Summary: med ?   Patient called in wants to know if he should still be taking dapagliflozin propanediol (FARXIGA) 5 MG TABS tablet, because he will be without any until he finds another pharmacy [place].          Call History   Type Contact Phone/Fax User  04/28/2022 02:07 PM EDT Phone (Incoming) Abbott, Ricky [place] "ken" (Self) [phone] Lemmie Evens) [name], [name]   Reason for Disposition  [1] Caller has URGENT medicine question about med that PCP or specialist prescribed AND [2] triager unable to answer question    Farxiga refill question.    Going out of town for a while and will need refills.  Answer Assessment - Initial Assessment Questions 1. NAME of MEDICINE: "What medicine(s) are you calling about?"     Farxiga 5 mg I'm going out of town to [place], [place] for a while.   Leaving this Sat.   I don't know how long I will be gone but for a while. 2. QUESTION: "What is your question?" (e.g., double dose of medicine, side effect)     No refills left on the Farxiga.    Does Dr. Rosana Berger  want me to continue with it and have me call for a refill?    [facility] will not refill this drug until I am am down to about 4 pills.    They fill all my over medications before 4 days but not the [place].    I need to find a drug store that is in my network in [place] and call and let Dr. Rosana Berger  know where to send my refills.     I wanted to get a head start on this so I don't get to [facility] and run out of this medication.  3. PRESCRIBER: "Who prescribed the medicine?" Reason: if prescribed by specialist, call should be referred to that group.     Dr. Rosana Berger   4. SYMPTOMS: "Do you have any symptoms?" If Yes, ask: "What symptoms are you having?"  "How bad are the symptoms (e.g., mild, moderate, severe)     N/A 5. PREGNANCY:  "Is there any chance that you are pregnant?" "When was your last N/A  Protocols used:  Medication Question Call-A-AH

## 2022-04-28 NOTE — Telephone Encounter (Signed)
  Chief Complaint: Going to [place], [place] this Sat. And needs refill directions for his Farxiga 5 mg. Symptoms: There are no refills left.   He is on his last bottle.  He is going to find out what pharmacy is in his network so his insurance will cover it and call us and let us know which one to send the rx to.    In the mean time he is wanting to know what he should do so he won't run out since he is on his last bottle.    His pharmacy won't let him get refills of the Farxiga until he is down to 4 pills. Frequency: N/A Pertinent Negatives: Patient denies N/A Disposition: [] ED /[] Urgent Care (no appt availability in office) / [] Appointment(In office/virtual)/ []  [facility]/ [] Home Care/ [] Refused Recommended Disposition /[] [facility]/ [x]  Follow-up with PCP Additional Notes  He is agreeable to someone calling him back and advising him how is the best way to handle his refills.

## 2022-04-30 ENCOUNTER — Telehealth: Payer: Self-pay

## 2022-04-30 NOTE — Telephone Encounter (Signed)
Copied from [place] [id number]. Topic: General - Other >> Apr 30, 2022  9:04 AM Cyndi Bender wrote: Reason for CRM: Pt stated his local pharmacy informed him that it is too soon to refill the dapagliflozin propanediol (FARXIGA) 5 MG TABS tablet. Pt stated he has to go to [facility] for an emergency so he will locate a pharmacy that is in network with his insurance so he can have the Rx filled there. Pt will call back once he has located a pharmacy that is network with his insurance so the Rx can be sent to that pharmacy

## 2022-05-06 ENCOUNTER — Telehealth: Payer: Self-pay | Admitting: Internal Medicine

## 2022-05-06 NOTE — Telephone Encounter (Signed)
Contacted Sundiata Cucchi Messler to schedule their annual wellness visit. Appointment made for 05/08/2022.  [place] Direct Dial: [phone]

## 2022-05-08 ENCOUNTER — Ambulatory Visit (INDEPENDENT_AMBULATORY_CARE_PROVIDER_SITE_OTHER): Payer: Medicare HMO

## 2022-05-08 VITALS — Ht 71.0 in | Wt 177.0 lb

## 2022-05-08 DIAGNOSIS — Z0000 Encounter for general adult medical examination without abnormal findings: Secondary | ICD-10-CM | POA: Diagnosis not present

## 2022-05-08 NOTE — Progress Notes (Signed)
I connected with  [name] on 05/08/22 by a audio enabled telemedicine application and verified that I am speaking with the correct person using two identifiers.  Patient Location: Home  Provider Location: Office/Clinic  I discussed the limitations of evaluation and management by telemedicine. The patient expressed understanding and agreed to proceed.  Subjective:   Ricky Abbott is a 77 y.o. male who presents for Medicare Annual/Subsequent preventive examination.  Review of Systems    Cardiac Risk Factors include: advanced age (>67men, >56 women);diabetes mellitus;dyslipidemia;male gender;hypertension    Objective:    Today's Vitals   05/08/22 0814  Weight: 177 lb (80.3 kg)  Height: 5\' 11"  (1.803 m)   Body mass index is 24.69 kg/m.     05/08/2022    8:34 AM 04/01/2022    1:28 PM 03/24/2022   11:38 AM 08/26/2021    9:04 AM 08/12/2021    8:15 AM 12/04/2020   12:23 PM 11/07/2020   11:00 AM  Advanced Directives  Does Patient Have a Medical Advance Directive? No No No No No No No  Would patient like information on creating a medical advance directive?  No - Patient declined  No - Patient declined No - Patient declined No - Patient declined No - Patient declined    Current Medications (verified) Outpatient Encounter Medications as of 05/08/2022  Medication Sig   acetaminophen (TYLENOL) 500 MG tablet Take 2 tablets (1,000 mg total) by mouth every 6 (six) hours as needed for mild pain.   aspirin EC 81 MG EC tablet Take 1 tablet (81 mg total) by mouth daily. Swallow whole.   dapagliflozin propanediol (FARXIGA) 5 MG TABS tablet Take 1 tablet (5 mg total) by mouth daily before breakfast.   ezetimibe (ZETIA) 10 MG tablet Take 1 tablet (10 mg total) by mouth daily.   nitroGLYCERIN (NITROSTAT) 0.4 MG SL tablet PLACE 1 TABLET(0.4MG  TOTAL) UNDER THE TONGUE EVERY 5 MINUTES AS NEEDED FOR CHEST PAIN   pantoprazole (PROTONIX) 40 MG tablet Take 1 tablet (40 mg total) by mouth daily.    rosuvastatin (CRESTOR) 40 MG tablet Take 1 tablet (40 mg total) by mouth daily.   sertraline (ZOLOFT) 50 MG tablet Take 1 tablet (50 mg total) by mouth daily.   Facility-Administered Encounter Medications as of 05/08/2022  Medication   sodium chloride flush (NS) 0.9 % injection 3 mL    Allergies (verified) Peanut-containing drug products   History: Past Medical History:  Diagnosis Date   Acute ST elevation myocardial infarction (STEMI) of inferior wall ([place]) 05/19/2012   a.) LHC/PCI 05/19/2012: 25% LM, severe diff prox RI disease, mild ISR pLAD stent (unknown type), 99% oRCA ([facility]bbott [facility] BMS)   Allergy    Anxiety    Asthma    CAD, multiple vessel    a.) LHC ~1999 in [place] -> stent (unk type) pLAD; b.) LHC/PCI 05/19/12: 25% LM, sev diff Dz pRI, mild ISR pLAD, 99% oRCA (Abbott [facility] BMS); c.) LHC 10/06/19: 70% o-pLAD, 60% o-mLM, 60% p-mLAD, 70% OM1 -> Rx mgmt; d.) LHC 11/06/20: 55% m-dLM, 75% oLAD, 90% pLAD, 70% mLAD (aneurysm dist D1 takeoff), 50% RI, 55% pLCx, 90% OM1, 30% 2nd LPLB, 70% o-pRCA, 50% mRCA - CVTS consult; e.) 4v CABG 11/13/20   Cataract    COPD (chronic obstructive pulmonary disease) (HCC)    Depression    Dyspnea    GERD (gastroesophageal reflux disease)    History of bilateral cataract extraction 08/2021   History of chicken pox  Hyperlipidemia    Hypertension    Murmur    Myocardial infarction [facility])    NSVT (nonsustained ventricular tachycardia) ([place]) 12/31/2020   a.) holter 12/31/2020 --> single 4 beat run at a max rate of 160 bpm   Prostate enlargement    PSVT (paroxysmal supraventricular tachycardia) 12/31/2020   a.) holter 12/31/2020 --> 8 episodes with longest/fastest lasting 15 beats at a maximum rate of 179 bpm   PTSD (post-traumatic stress disorder)    [place] Vet   RBBB (right bundle branch block)    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   a.) LIMA-LAD, SVG-OM1, SVG-PDA, SVG-D1   Sarcoidosis of lung ([place])    T2DM (type 2 diabetes mellitus) ([place])     Umbilical hernia    Past Surgical History:  Procedure Laterality Date   CATARACT EXTRACTION W/PHACO Right 08/12/2021   Procedure: CATARACT EXTRACTION PHACO AND INTRAOCULAR LENS PLACEMENT ([place]) RIGHT DIABETIC;  Surgeon: Eulogio Bear, MD;  Location: [place];  Service: Ophthalmology;  Laterality: Right;  NEEDS ARRIVAL AFTER 7AM DUE TO TRANSPORTATION 9.57 00:54.8   CATARACT EXTRACTION W/PHACO Left 08/26/2021   Procedure: CATARACT EXTRACTION PHACO AND INTRAOCULAR LENS PLACEMENT ([place]) LEFT DIABETIC;  Surgeon: Eulogio Bear, MD;  Location: [facility];  Service: Ophthalmology;  Laterality: Left;  7.59 00:50.5   CHOLECYSTECTOMY N/A 06/05/2016   Procedure: LAPAROSCOPIC CHOLECYSTECTOMY with cholangiogram;  Surgeon: Jules Husbands, MD;  Location: [facility];  Service: General;  Laterality: N/A;   CORONARY ANGIOPLASTY WITH STENT PLACEMENT Left 1999   Procedure: CORONARY ANGIOPLASTY WITH STENT PLACEMENT; Location: [place], [place]   CORONARY ARTERY BYPASS GRAFT N/A 11/13/2020   Procedure: CORONARY ARTERY BYPASS GRAFTING (CABG) x 4 ON CARDIOPULMONARY BYPASS USING LIMA AND RIGHT GSV. -LIMA to LAD -SVG to OM -SVG to PDA -SVG to DIAGONAL;  Surgeon: Lajuana Matte, MD;  Location: [place];  Service: Open Heart Surgery;  Laterality: N/A;   ENDOVEIN HARVEST OF GREATER SAPHENOUS VEIN Right 11/13/2020   Procedure: ENDOVEIN HARVEST OF GREATER SAPHENOUS VEIN;  Surgeon: Lajuana Matte, MD;  Location: [place];  Service: Open Heart Surgery;  Laterality: Right;   EYE SURGERY     INSERTION OF MESH N/A 04/01/2022   Procedure: INSERTION OF MESH;  Surgeon: Olean Ree, MD;  Location: [facility];  Service: General;  Laterality: N/A;   IR GENERIC HISTORICAL  04/30/2016   IR PERC CHOLECYSTOSTOMY 04/30/2016 [facility]INTER[facility]   LEFT HEART CATH AND CORONARY ANGIOGRAPHY Left 10/06/2019   Procedure: LEFT HEART CATH AND CORONARY ANGIOGRAPHY;  Surgeon: Minna Merritts, MD;  Location: [place] [facility];  Service: Cardiovascular;  Laterality: Left;   LEFT HEART CATH AND CORONARY ANGIOGRAPHY N/A 11/06/2020   Procedure: LEFT HEART CATH AND CORONARY ANGIOGRAPHY;  Surgeon: Nelva Bush, MD;  Location: [place] [facility];  Service: Cardiovascular;  Laterality: N/A;   TEE WITHOUT CARDIOVERSION N/A 11/13/2020   Procedure: TRANSESOPHAGEAL ECHOCARDIOGRAM (TEE);  Surgeon: Lajuana Matte, MD;  Location: [place];  Service: Open Heart Surgery;  Laterality: N/A;   Family History  Problem Relation Age of Onset   Cancer Mother    Hyperlipidemia Mother    Hypertension Mother    Cancer Father        stomach   Hyperlipidemia Father    Hypertension Father    Cancer Brother        bone cancer   Social History   Socioeconomic History   Marital status: Single    Spouse name: Not on  file   Number of children: Not on file   Years of education: Not on file   Highest education level: Not on file  Occupational History   Not on file  Tobacco Use   Smoking status: Former    Packs/day: 4.00    Years: 15.00    Additional pack years: 0.00    Total pack years: 60.00    Types: Cigarettes    Quit date: 08/20/1982    Years since quitting: 39.7    Passive exposure: Past   Smokeless tobacco: Never  Vaping Use   Vaping Use: Never used  Substance and Sexual Activity   Alcohol use: No   Drug use: Never   Sexual activity: Not Currently    Birth control/protection: None    Comment: NONE  Other Topics Concern   Not on file  Social History Narrative   Yvone Neu grew up in [place], [place]. He moved to the [place] area in 2013. 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   Social Determinants of Health   Financial Resource Strain: Medium Risk (05/08/2022)   Overall Financial Resource Strain (CARDIA)    Difficulty of Paying Living Expenses: Somewhat hard  Food Insecurity: No Food Insecurity (05/08/2022)   Hunger Vital Sign    Worried About Running Out of Food in the Last Year: Never true     Ran Out of Food in the Last Year: Never true  Transportation Needs: No Transportation Needs (05/08/2022)   PRAPARE - Hydrologist (Medical): No    Lack of Transportation (Non-Medical): No  Physical Activity: Insufficiently Active (05/08/2022)   Exercise Vital Sign    Days of Exercise per Week: 4 days    Minutes of Exercise per Session: 20 min  Stress: No Stress Concern Present (05/08/2022)   [place]    Feeling of Stress : Not at all  Social Connections: Socially Isolated (05/08/2022)   Social Connection and Isolation Panel [NHANES]    Frequency of Communication with Friends and Family: More than three times a week    Frequency of Social Gatherings with Friends and Family: Once a week    Attends Religious Services: Never    Marine scientist or Organizations: No    Attends Music therapist: Never    Marital Status: Divorced    Tobacco Counseling Counseling given: Not Answered   Clinical Intake:  Pre-visit preparation completed: Yes  Pain : No/denies pain     BMI - recorded: 24.69 Nutritional Status: BMI of 19-24  Normal Nutritional Risks: None Diabetes: Yes CBG done?: No Did pt. bring in CBG monitor from home?: No  How often do you need to have someone help you when you read instructions, pamphlets, or other written materials from your doctor or pharmacy?: 1 - Never  Diabetic?yes  Interpreter Needed?: No  Comments: lives alone Information entered by :: B.Fayette Hamada,LPN   Activities of Daily Living    05/08/2022    8:34 AM 04/01/2022    1:33 PM  In your present state of health, do you have any difficulty performing the following activities:  Hearing? 0 0  Vision? 0 0  Difficulty concentrating or making decisions? 0 0  Walking or climbing stairs? 1 0  Dressing or bathing? 0 0  Doing errands, shopping? 1   Comment does not drive;has friend to take him    Preparing Food and eating ? N   Using the Toilet? N  In the past six months, have you accidently leaked urine? N   Do you have problems with loss of bowel control? N   Managing your Medications? N   Managing your Finances? N   Housekeeping or managing your Housekeeping? N     Patient Care Team: Teodora Medici, DO as PCP - General (Internal Medicine) Minna Merritts, MD as PCP - Cardiology (Cardiology) Minna Merritts, MD as Consulting Physician (Cardiology)  Indicate any recent Medical Services you may have received from other than [facility] providers in the past year (date may be approximate).     Assessment:   This is a routine wellness examination for Ricky Abbott.  Hearing/Vision screen Hearing Screening - Comments:: Adequate hearing Vision Screening - Comments:: Adequate vision after cataract surgery Dr [facility]  Dietary issues and exercise activities discussed: Current Exercise Habits: Home exercise routine, Type of exercise: walking, Time (Minutes): 20, Frequency (Times/Week): 3, Weekly Exercise (Minutes/Week): 60, Intensity: Mild, Exercise limited by: cardiac condition(s);respiratory conditions(s)   Goals Addressed             This Visit's Progress    Increase water intake   On track      Depression Screen    05/08/2022    8:28 AM 03/20/2022    8:03 AM 02/21/2022    7:59 AM 02/11/2022   10:02 AM 03/21/2016    8:15 AM 05/23/2015    9:47 PM 08/15/2013    9:58 AM  PHQ 2/9 Scores  PHQ - 2 Score 0 0 0 0 1 3 0  PHQ- 9 Score   0 0  7     Fall Risk    05/08/2022    8:21 AM 03/20/2022    8:03 AM 03/17/2022    8:31 AM 03/17/2022    8:28 AM 02/21/2022    7:58 AM  [place] in the past year? 0 0 0 0 0  Number falls in past yr: 0 0   0  Injury with Fall? 0 0   0  Risk for fall due to : No Fall Risks    No Fall Risks  Follow up Education provided;Falls prevention discussed    Falls prevention discussed;Education provided;Falls evaluation completed    FALL  RISK PREVENTION PERTAINING TO THE HOME:  Any stairs in or around the home? No  If so, are there any without handrails? No  Home free of loose throw rugs in walkways, pet beds, electrical cords, etc? Yes  Adequate lighting in your home to reduce risk of falls? Yes   ASSISTIVE DEVICES UTILIZED TO PREVENT FALLS:  Life alert? No  Use of a cane, walker or w/c? No  Grab bars in the bathroom? Yes  Shower chair or bench in shower? No  Elevated toilet seat or a handicapped toilet? No    Cognitive Function:        05/08/2022    8:39 AM 03/21/2016    8:47 AM  6CIT Screen  What Year? 0 points 0 points  What month? 0 points 0 points  What time? 0 points 0 points  Count back from 20 0 points 0 points  Months in reverse 0 points 0 points  Repeat phrase 0 points 0 points  Total Score 0 points 0 points    Immunizations Immunization History  Administered Date(s) Administered   Fluad Quad(high Dose 65+) 12/06/2020   Influenza Split 11/14/2013   Influenza, High Dose Seasonal PF 12/13/2015, 11/28/2016, 10/18/2017, 12/08/2018, 10/12/2019   Moderna  Covid-19 Vaccine Bivalent Booster 42yrs & up 01/03/2020, 05/11/2020   Moderna Sars-Covid-2 Vaccination 03/31/2019, 04/28/2019   Pneumococcal Conjugate-13 03/21/2016   Pneumococcal Polysaccharide-23 01/26/2014   Td 08/15/2013    TDAP status: Up to date  Flu Vaccine status: Due, Education has been provided regarding the importance of this vaccine. Advised may receive this vaccine at local pharmacy or Health Dept. Aware to provide a copy of the vaccination record if obtained from local pharmacy or Health Dept. Verbalized acceptance and understanding.  Pneumococcal vaccine status: Up to date  Covid-19 vaccine status: Completed vaccines  Qualifies for Shingles Vaccine? Yes   Zostavax completed No   Shingrix Completed?: No.    Education has been provided regarding the importance of this vaccine. Patient has been advised to call insurance company to  determine out of pocket expense if they have not yet received this vaccine. Advised may also receive vaccine at local pharmacy or Health Dept. Verbalized acceptance and understanding.  Screening Tests Health Maintenance  Topic Date Due   FOOT EXAM  02/01/2020   COVID-19 Vaccine (5 - 2023-24 season) 10/11/2021   INFLUENZA VACCINE  05/11/2022 (Originally 09/10/2021)   Zoster Vaccines- Shingrix (1 of 2) 05/13/2022 (Originally 07/25/1964)   HEMOGLOBIN A1C  08/12/2022   OPHTHALMOLOGY EXAM  08/27/2022   Diabetic kidney evaluation - Urine ACR  02/12/2023   Diabetic kidney evaluation - eGFR measurement  03/21/2023   Medicare Annual Wellness (AWV)  05/08/2023   DTaP/Tdap/Td (2 - Tdap) 08/16/2023   Pneumonia Vaccine 75+ Years old  Completed   Hepatitis C Screening  Completed   HPV VACCINES  Aged Out   COLONOSCOPY (Pts 45-1yrs Insurance coverage will need to be confirmed)  Discontinued    Health Maintenance  Health Maintenance Due  Topic Date Due   FOOT EXAM  02/01/2020   COVID-19 Vaccine (5 - 2023-24 season) 10/11/2021    Colorectal cancer screening: No longer required.   Lung Cancer Screening: (Low Dose CT Chest recommended if Age 56-80 years, 30 pack-year currently smoking OR have quit w/in 15years.) does not qualify.   Lung Cancer Screening Referral: no  Additional Screening:  Hepatitis C Screening: does not qualify; Completed yes  Vision Screening: Recommended annual ophthalmology exams for early detection of glaucoma and other disorders of the eye. Is the patient up to date with their annual eye exam?  Yes  Who is the provider or what is the name of the office in which the patient attends annual eye exams? Dr Edison Pace If pt is not established with a provider, would they like to be referred to a provider to establish care? No .   Dental Screening: Recommended annual dental exams for proper oral hygiene  Community Resource Referral / Chronic Care Management: CRR required this visit?   No   CCM required this visit?  No      Plan:     I have personally reviewed and noted the following in the patient's chart:   Medical and social history Use of alcohol, tobacco or illicit drugs  Current medications and supplements including opioid prescriptions. Patient is not currently taking opioid prescriptions. Functional ability and status Nutritional status Physical activity Advanced directives List of other physicians Hospitalizations, surgeries, and ER visits in previous 12 months Vitals Screenings to include cognitive, depression, and falls Referrals and appointments  In addition, I have reviewed and discussed with patient certain preventive protocols, quality metrics, and best practice recommendations. A written personalized care plan for preventive services as well as general  preventive health recommendations were provided to patient.     Roger Shelter, LPN   [id number]   Nurse Notes: pt is doing alright: has hx of cardiac and recent hernia repair surgery. Pt relays he financially needs help with housing costs but he does not qualify for the programs he has tried. Pt desires the flu vaccine when he comes for his office visit next week.

## 2022-05-08 NOTE — Patient Instructions (Signed)
Ricky Abbott , Thank you for taking time to come for your Medicare Wellness Visit. I appreciate your ongoing commitment to your health goals. Please review the following plan we discussed and let me know if I can assist you in the future.   These are the goals we discussed:  Goals      Increase water intake        This is a list of the screening recommended for you and due dates:  Health Maintenance  Topic Date Due   Complete foot exam   02/01/2020   COVID-19 Vaccine (5 - 2023-24 season) 10/11/2021   Flu Shot  05/11/2022*   Zoster (Shingles) Vaccine (1 of 2) 05/13/2022*   Hemoglobin A1C  08/12/2022   Eye exam for diabetics  08/27/2022   Yearly kidney health urinalysis for diabetes  02/12/2023   Yearly kidney function blood test for diabetes  03/21/2023   Medicare Annual Wellness Visit  05/08/2023   DTaP/Tdap/Td vaccine (2 - Tdap) 08/16/2023   Pneumonia Vaccine  Completed   Hepatitis C Screening: USPSTF Recommendation to screen - Ages 43-79 yo.  Completed   HPV Vaccine  Aged Out   Colon Cancer Screening  Discontinued  *Topic was postponed. The date shown is not the original due date.    Advanced directives: no  Conditions/risks identified: none  Next appointment: Follow up in one year for your annual wellness visit. 05/14/2023 @8 :15am telephone  Preventive Care 65 Years and Older, Male  Preventive care refers to lifestyle choices and visits with your health care provider that can promote health and wellness. What does preventive care include? A yearly physical exam. This is also called an annual well check. Dental exams once or twice a year. Routine eye exams. Ask your health care provider how often you should have your eyes checked. Personal lifestyle choices, including: Daily care of your teeth and gums. Regular physical activity. Eating a healthy diet. Avoiding tobacco and drug use. Limiting alcohol use. Practicing safe sex. Taking low doses of aspirin every  day. Taking vitamin and mineral supplements as recommended by your health care provider. What happens during an annual well check? The services and screenings done by your health care provider during your annual well check will depend on your age, overall health, lifestyle risk factors, and family history of disease. Counseling  Your health care provider may ask you questions about your: Alcohol use. Tobacco use. Drug use. Emotional well-being. Home and relationship well-being. Sexual activity. Eating habits. History of falls. Memory and ability to understand (cognition). Work and work Statistician. Screening  You may have the following tests or measurements: Height, weight, and BMI. Blood pressure. Lipid and cholesterol levels. These may be checked every 5 years, or more frequently if you are over 24 years old. Skin check. Lung cancer screening. You may have this screening every year starting at age 48 if you have a 30-pack-year history of smoking and currently smoke or have quit within the past 15 years. Fecal occult blood test (FOBT) of the stool. You may have this test every year starting at age 77. Flexible sigmoidoscopy or colonoscopy. You may have a sigmoidoscopy every 5 years or a colonoscopy every 10 years starting at age 46. Prostate cancer screening. Recommendations will vary depending on your family history and other risks. Hepatitis C blood test. Hepatitis B blood test. Sexually transmitted disease (STD) testing. Diabetes screening. This is done by checking your blood sugar (glucose) after you have not eaten for a while (fasting).  You may have this done every 1-3 years. Abdominal aortic aneurysm (AAA) screening. You may need this if you are a current or former smoker. Osteoporosis. You may be screened starting at age 65 if you are at high risk. Talk with your health care provider about your test results, treatment options, and if necessary, the need for more  tests. Vaccines  Your health care provider may recommend certain vaccines, such as: Influenza vaccine. This is recommended every year. Tetanus, diphtheria, and acellular pertussis (Tdap, Td) vaccine. You may need a Td booster every 10 years. Zoster vaccine. You may need this after age 36. Pneumococcal 13-valent conjugate (PCV13) vaccine. One dose is recommended after age 28. Pneumococcal polysaccharide (PPSV23) vaccine. One dose is recommended after age 73. Talk to your health care provider about which screenings and vaccines you need and how often you need them. This information is not intended to replace advice given to you by your health care provider. Make sure you discuss any questions you have with your health care provider. Document Released: 02/23/2015 Document Revised: 10/17/2015 Document Reviewed: 11/28/2014 Elsevier Interactive Patient Education  2017 [place] Prevention in the Home Falls can cause injuries. They can happen to people of all ages. There are many things you can do to make your home safe and to help prevent falls. What can I do on the outside of my home? Regularly fix the edges of walkways and driveways and fix any cracks. Remove anything that might make you trip as you walk through a door, such as a raised step or threshold. Trim any bushes or trees on the path to your home. Use bright outdoor lighting. Clear any walking paths of anything that might make someone trip, such as rocks or tools. Regularly check to see if handrails are loose or broken. Make sure that both sides of any steps have handrails. Any raised decks and porches should have guardrails on the edges. Have any leaves, snow, or ice cleared regularly. Use sand or salt on walking paths during winter. Clean up any spills in your garage right away. This includes oil or grease spills. What can I do in the bathroom? Use night lights. Install grab bars by the toilet and in the tub and shower.  Do not use towel bars as grab bars. Use non-skid mats or decals in the tub or shower. If you need to sit down in the shower, use a plastic, non-slip stool. Keep the floor dry. Clean up any water that spills on the floor as soon as it happens. Remove soap buildup in the tub or shower regularly. Attach bath mats securely with double-sided non-slip rug tape. Do not have throw rugs and other things on the floor that can make you trip. What can I do in the bedroom? Use night lights. Make sure that you have a light by your bed that is easy to reach. Do not use any sheets or blankets that are too big for your bed. They should not hang down onto the floor. Have a firm chair that has side arms. You can use this for support while you get dressed. Do not have throw rugs and other things on the floor that can make you trip. What can I do in the kitchen? Clean up any spills right away. Avoid walking on wet floors. Keep items that you use a lot in easy-to-reach places. If you need to reach something above you, use a strong step stool that has a grab bar. Keep  electrical cords out of the way. Do not use floor polish or wax that makes floors slippery. If you must use wax, use non-skid floor wax. Do not have throw rugs and other things on the floor that can make you trip. What can I do with my stairs? Do not leave any items on the stairs. Make sure that there are handrails on both sides of the stairs and use them. Fix handrails that are broken or loose. Make sure that handrails are as long as the stairways. Check any carpeting to make sure that it is firmly attached to the stairs. Fix any carpet that is loose or worn. Avoid having throw rugs at the top or bottom of the stairs. If you do have throw rugs, attach them to the floor with carpet tape. Make sure that you have a light switch at the top of the stairs and the bottom of the stairs. If you do not have them, ask someone to add them for you. What else  can I do to help prevent falls? Wear shoes that: Do not have high heels. Have rubber bottoms. Are comfortable and fit you well. Are closed at the toe. Do not wear sandals. If you use a stepladder: Make sure that it is fully opened. Do not climb a closed stepladder. Make sure that both sides of the stepladder are locked into place. Ask someone to hold it for you, if possible. Clearly mark and make sure that you can see: Any grab bars or handrails. First and last steps. Where the edge of each step is. Use tools that help you move around (mobility aids) if they are needed. These include: Canes. Walkers. Scooters. Crutches. Turn on the lights when you go into a dark area. Replace any light bulbs as soon as they burn out. Set up your furniture so you have a clear path. Avoid moving your furniture around. If any of your floors are uneven, fix them. If there are any pets around you, be aware of where they are. Review your medicines with your doctor. Some medicines can make you feel dizzy. This can increase your chance of falling. Ask your doctor what other things that you can do to help prevent falls. This information is not intended to replace advice given to you by your health care provider. Make sure you discuss any questions you have with your health care provider. Document Released: 11/23/2008 Document Revised: 07/05/2015 Document Reviewed: 03/03/2014 Elsevier Interactive Patient Education  2017 [employer].

## 2022-05-12 NOTE — Progress Notes (Unsigned)
Established Patient Office Visit  Subjective    Patient ID: Ricky Abbott, male    DOB: 1945/07/29  Age: 77 y.o. MRN: [id number]  CC:  No chief complaint on file.   HPI Behnam Shawhan Dunckel presents to follow up on chronic medical conditions.  Diabetes, Type 2: -Last A1c 6.2 1/24 -Medications: Added Farxiga 5 mg at LOV -Checking BG at home: no -Diet: eats cereal for breakfast, veggies for lunch and meals on wheels for some dinners. Likes to eat no salt saltine crackers for snack -Eye exam: UTD 7/23 -Foot exam: Due at follow up  -Microalbumin: UTD 1/24, elevated -Statin: yes -PNA vaccine: Due, discuss at follow up -Denies symptoms of hypoglycemia, polyuria, polydipsia, numbness extremities, foot ulcers/trauma.   HLD/CAD/Hx of MI: -Medications: Crestor 40 mg, Zetia 10 mg, aspirin 81 mg, Nitro PRN -Patient is compliant with above medications and reports no side effects.  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   PTSD: -Mood status: stable -Current treatment: Zoloft 50 mg, decreased at [place] -Satisfied with current treatment?: yes -Duration of current treatment : chronic -Side effects: no Medication compliance: excellent compliance -PTSD from time in [place]     05/08/2022    8:28 AM 03/20/2022    8:03 AM 02/21/2022    7:59 AM 02/11/2022   10:02 AM 03/21/2016    8:15 AM  Depression screen PHQ 2/9  Decreased Interest 0 0 0 0 1  Down,  Depressed, Hopeless 0 0 0 0 0  PHQ - 2 Score 0 0 0 0 1  Altered sleeping   0 0   Tired, decreased energy   0 0   Change in appetite   0 0   Feeling bad or failure about yourself    0 0   Trouble concentrating   0 0   Moving slowly or fidgety/restless   0 0   Suicidal thoughts   0 0   PHQ-9 Score   0 0   Difficult doing work/chores   Not difficult at all Not difficult at all    Chronic Left Knee Pain: -Has had for years but appears to be worse -Will feel unstable with certain movements, like crossing legs -Occasionally has electric-like pain shooting from his back into his knee - does wear phone in left back pocket  Health Maintenance: -Blood work due -Colon cancer screening: due, last colonoscopy 08/2009 - had one scheduled but had to cancel due to a transportation issue -Flu vaccine due  Outpatient Encounter Medications as of 05/13/2022  Medication Sig   acetaminophen (TYLENOL) 500 MG tablet Take 2 tablets (1,000 mg total) by mouth every 6 (six) hours as needed for mild pain.   aspirin EC 81 MG EC tablet Take 1 tablet (81 mg total) by mouth daily. Swallow whole.   dapagliflozin propanediol (FARXIGA) 5 MG TABS tablet Take 1 tablet (5 mg total) by mouth daily  before breakfast.   ezetimibe (ZETIA) 10 MG tablet Take 1 tablet (10 mg total) by mouth daily.   nitroGLYCERIN (NITROSTAT) 0.4 MG SL tablet PLACE 1 TABLET(0.4MG  TOTAL) UNDER THE TONGUE EVERY 5 MINUTES AS NEEDED FOR CHEST PAIN   pantoprazole (PROTONIX) 40 MG tablet Take 1 tablet (40 mg total) by mouth daily.   rosuvastatin (CRESTOR) 40 MG tablet Take 1 tablet (40 mg total) by mouth daily.   sertraline (ZOLOFT) 50 MG tablet Take 1 tablet (50 mg total) by mouth daily.   Facility-Administered Encounter Medications as of 05/13/2022  Medication   sodium chloride flush (NS) 0.9 % injection 3 mL    Past Medical History:  Diagnosis Date   Acute ST elevation myocardial infarction (STEMI) of inferior wall ([place]) 05/19/2012   a.) LHC/PCI  05/19/2012: 25% LM, severe diff prox RI disease, mild ISR pLAD stent (unknown type), 99% oRCA ([facility]MS)   Allergy    Anxiety    Asthma    CAD, multiple vessel    a.) LHC ~1999 in [place] -> stent (unk type) pLAD; b.) LHC/PCI 05/19/12: 25% LM, sev diff Dz pRI, mild ISR pLAD, 99% oRCA ([facility]); c.) LHC 10/06/19: 70% o-pLAD, 60% o-mLM, 60% p-mLAD, 70% OM1 -> Rx mgmt; d.) LHC 11/06/20: 55% m-dLM, 75% oLAD, 90% pLAD, 70% mLAD (aneurysm dist D1 takeoff), 50% RI, 55% pLCx, 90% OM1, 30% 2nd LPLB, 70% o-pRCA, 50% mRCA - CVTS consult; e.) 4v CABG 11/13/20   Cataract    COPD (chronic obstructive pulmonary disease) (HCC)    Depression    Dyspnea    GERD (gastroesophageal reflux disease)    History of bilateral cataract extraction 08/2021   History of chicken pox    Hyperlipidemia    Hypertension    Murmur    Myocardial infarction [facility])    NSVT (nonsustained ventricular tachycardia) ([place]) 12/31/2020   a.) holter 12/31/2020 --> single 4 beat run at a max rate of 160 bpm   Prostate enlargement    PSVT (paroxysmal supraventricular tachycardia) 12/31/2020   a.) holter 12/31/2020 --> 8 episodes with longest/fastest lasting 15 beats at a maximum rate of 179 bpm   PTSD (post-traumatic stress disorder)    [place] Vet   RBBB (right bundle branch block)    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   a.) LIMA-LAD, SVG-OM1, SVG-PDA, SVG-D1   Sarcoidosis of lung ([place])    T2DM (type 2 diabetes mellitus) ([place])    Umbilical hernia     Past Surgical History:  Procedure Laterality Date   CATARACT EXTRACTION W/PHACO Right 08/12/2021   Procedure: CATARACT EXTRACTION PHACO AND INTRAOCULAR LENS PLACEMENT ([place]) RIGHT DIABETIC;  Surgeon: Eulogio Bear, MD;  Location: [place];  Service: Ophthalmology;  Laterality: Right;  NEEDS ARRIVAL AFTER 7AM DUE TO TRANSPORTATION 9.57 00:54.8   CATARACT EXTRACTION W/PHACO Left 08/26/2021   Procedure: CATARACT EXTRACTION PHACO AND INTRAOCULAR LENS  PLACEMENT ([place]) LEFT DIABETIC;  Surgeon: Eulogio Bear, MD;  Location: [place];  Service: Ophthalmology;  Laterality: Left;  7.59 00:50.5   CHOLECYSTECTOMY N/A 06/05/2016   Procedure: LAPAROSCOPIC CHOLECYSTECTOMY with cholangiogram;  Surgeon: Jules Husbands, MD;  Location: [facility];  Service: General;  Laterality: N/A;   CORONARY ANGIOPLASTY WITH STENT PLACEMENT Left 1999   Procedure: CORONARY ANGIOPLASTY WITH STENT PLACEMENT; Location: [place] BYPASS GRAFT N/A 11/13/2020   Procedure: CORONARY ARTERY BYPASS GRAFTING (CABG) x 4 ON CARDIOPULMONARY BYPASS USING LIMA AND RIGHT GSV. -LIMA  to LAD -SVG to OM -SVG to PDA -SVG to DIAGONAL;  Surgeon: Lajuana Matte, MD;  Location: [place];  Service: Open Heart Surgery;  Laterality: N/A;   ENDOVEIN HARVEST OF GREATER SAPHENOUS VEIN Right 11/13/2020   Procedure: ENDOVEIN HARVEST OF GREATER SAPHENOUS VEIN;  Surgeon: Lajuana Matte, MD;  Location: [place];  Service: Open Heart Surgery;  Laterality: Right;   EYE SURGERY     INSERTION OF MESH N/A 04/01/2022   Procedure: INSERTION OF MESH;  Surgeon: Olean Ree, MD;  Location: [facility];  Service: General;  Laterality: N/A;   IR GENERIC HISTORICAL  04/30/2016   IR PERC CHOLECYSTOSTOMY 04/30/2016 [facility]   LEFT HEART CATH AND CORONARY ANGIOGRAPHY Left 10/06/2019   Procedure: LEFT HEART CATH AND CORONARY ANGIOGRAPHY;  Surgeon: Minna Merritts, MD;  Location: [place] [facility];  Service: Cardiovascular;  Laterality: Left;   LEFT HEART CATH AND CORONARY ANGIOGRAPHY N/A 11/06/2020   Procedure: LEFT HEART CATH AND CORONARY ANGIOGRAPHY;  Surgeon: Nelva Bush, MD;  Location: [place] [facility];  Service: Cardiovascular;  Laterality: N/A;   TEE WITHOUT CARDIOVERSION N/A 11/13/2020   Procedure: TRANSESOPHAGEAL ECHOCARDIOGRAM (TEE);  Surgeon: Lajuana Matte, MD;  Location: [place];  Service: Open Heart Surgery;  Laterality: N/A;    Family History   Problem Relation Age of Onset   Cancer Mother    Hyperlipidemia Mother    Hypertension Mother    Cancer Father        stomach   Hyperlipidemia Father    Hypertension Father    Cancer Brother        bone cancer    Social History   Socioeconomic History   Marital status: Single    Spouse name: Not on file   Number of children: Not on file   Years of education: Not on file   Highest education level: Not on file  Occupational History   Not on file  Tobacco Use   Smoking status: Former    Packs/day: 4.00    Years: 15.00    Additional pack years: 0.00    Total pack years: 60.00    Types: Cigarettes    Quit date: 08/20/1982    Years since quitting: 39.7    Passive exposure: Past   Smokeless tobacco: Never  Vaping Use   Vaping Use: Never used  Substance and Sexual Activity   Alcohol use: No   Drug use: Never   Sexual activity: Not Currently    Birth control/protection: None    Comment: NONE  Other Topics Concern   Not on file  Social History Narrative   Yvone Neu grew up in [place], [place]. He moved to the [place] area in 2013. 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   Social Determinants of Health   Financial Resource Strain: Medium Risk (05/08/2022)   Overall Financial Resource Strain (CARDIA)    Difficulty of Paying Living Expenses: Somewhat hard  Food Insecurity: No Food Insecurity (05/08/2022)   Hunger Vital Sign    Worried About Running Out of Food in the Last Year: Never true    Ran Out of Food in the Last Year: Never true  Transportation Needs: No Transportation Needs (05/08/2022)   PRAPARE - Hydrologist (Medical): No    Lack of Transportation (Non-Medical): No  Physical Activity: Insufficiently Active (05/08/2022)   Exercise Vital Sign    Days of Exercise per Week: 4 days    Minutes of Exercise per Session: 20  min  Stress: No Stress Concern Present (05/08/2022)   [place]    Feeling of Stress : Not at all  Social Connections: Socially Isolated (05/08/2022)   Social Connection and Isolation Panel [NHANES]    Frequency of Communication with Friends and Family: More than three times a week    Frequency of Social Gatherings with Friends and Family: Once a week    Attends Religious Services: Never    Marine scientist or Organizations: No    Attends Archivist Meetings: Never    Marital Status: Divorced  Human resources officer Violence: Not At Risk (05/08/2022)   Humiliation, Afraid, Rape, and Kick questionnaire    Fear of Current or Ex-Partner: No    Emotionally Abused: No    Physically Abused: No    Sexually Abused: No    Review of Systems  Constitutional:  Negative for chills and fever.  Eyes:  Negative for blurred vision.  Respiratory:  Negative for shortness of breath.   Cardiovascular:  Negative for chest pain.  Gastrointestinal:  Positive for heartburn. Negative for abdominal pain.  Musculoskeletal:  Positive for joint pain.        Objective    There were no vitals taken for this visit.  Physical Exam Constitutional:      Appearance: Normal appearance.  HENT:     Head: Normocephalic and atraumatic.     Mouth/Throat:     Mouth: Mucous membranes are moist.     Pharynx: Oropharynx is clear.  Eyes:     Conjunctiva/sclera: Conjunctivae normal.  Cardiovascular:     Rate and Rhythm: Normal rate and regular rhythm.  Pulmonary:     Effort: Pulmonary effort is normal.     Breath sounds: Normal breath sounds.  Musculoskeletal:     Right lower leg: No edema.     Left lower leg: No edema.  Skin:    General: Skin is warm and dry.  Neurological:     General: No focal deficit present.     Mental Status: He is alert. Mental status is at baseline.  Psychiatric:        Mood and Affect: Mood normal.        Behavior: Behavior normal.         Assessment & Plan:   1. Type 2 diabetes mellitus with hyperglycemia,  without long-term current use of insulin ([place]): Has been diet controlled, not currently on any medication.  Due for A1c, CBC and CMP as well as urine microalbumin today.  - Hemoglobin A1C - COMPLETE METABOLIC PANEL WITH GFR - Urine Microalbumin w/creat. ratio - CBC w/Diff/Platelet  2. CAD, multiple vessel: History of CABG in 2022, following with cardiology note from 07/23/2021 reviewed.  Patient is on Crestor 40 mg and Zetia 10 mg as well as aspirin mg daily.  Due for recheck lipid panel.  Medications refilled today.  - Lipid panel - ezetimibe (ZETIA) 10 MG tablet; Take 1 tablet (10 mg total) by mouth daily.  Dispense: 90 tablet; Refill: 1 - rosuvastatin (CRESTOR) 40 MG tablet; Take 1 tablet (40 mg total) by mouth daily.  Dispense: 90 tablet; Refill: 1  3. Gastroesophageal reflux disease, unspecified whether esophagitis present: Symptoms not well-controlled despite taking pantoprazole 40 mg daily.  Overall diet is pretty bland.  Has had EGDs in the past, referral placed to GI both for colon cancer screening and repeat EGD.  - Ambulatory referral to Gastroenterology - pantoprazole (PROTONIX) 40  MG tablet; Take 1 tablet (40 mg total) by mouth daily.  Dispense: 90 tablet; Refill: 1  4. PTSD (post-traumatic stress disorder): Stable, patient requesting decreasing dose to 50 mg daily.  This will be sent to his pharmacy.  Follow-up in 3 months for recheck.  - sertraline (ZOLOFT) 50 MG tablet; Take 1 tablet (50 mg total) by mouth daily.  Dispense: 90 tablet; Refill: 1  5. Chronic pain of left knee: Will obtain x-ray today to rule out arthritis, however symptoms also consistent with left-sided sciatica.  Exercises provided, discussed wearing loosefitting clothing and not putting anything in his back pockets.  - DG Knee Complete 4 Views Left; Future  6. Prostate cancer screening: PSA due.  - PSA  7. Screening for colon cancer: Referral placed.   - Ambulatory referral to Gastroenterology   No  follow-ups on file.   Teodora Medici, DO

## 2022-05-13 ENCOUNTER — Ambulatory Visit (INDEPENDENT_AMBULATORY_CARE_PROVIDER_SITE_OTHER): Payer: Medicare HMO | Admitting: Internal Medicine

## 2022-05-13 ENCOUNTER — Ambulatory Visit: Payer: Medicare Other | Admitting: Internal Medicine

## 2022-05-13 ENCOUNTER — Encounter: Payer: Self-pay | Admitting: Internal Medicine

## 2022-05-13 VITALS — BP 132/74 | HR 96 | Temp 97.9°F | Resp 16 | Ht 71.0 in | Wt 176.9 lb

## 2022-05-13 DIAGNOSIS — I2510 Atherosclerotic heart disease of native coronary artery without angina pectoris: Secondary | ICD-10-CM

## 2022-05-13 DIAGNOSIS — E1165 Type 2 diabetes mellitus with hyperglycemia: Secondary | ICD-10-CM | POA: Diagnosis not present

## 2022-05-13 DIAGNOSIS — F4310 Post-traumatic stress disorder, unspecified: Secondary | ICD-10-CM

## 2022-05-13 DIAGNOSIS — R82998 Other abnormal findings in urine: Secondary | ICD-10-CM | POA: Diagnosis not present

## 2022-05-13 DIAGNOSIS — K219 Gastro-esophageal reflux disease without esophagitis: Secondary | ICD-10-CM

## 2022-05-13 DIAGNOSIS — R69 Illness, unspecified: Secondary | ICD-10-CM | POA: Diagnosis not present

## 2022-05-13 LAB — POCT GLYCOSYLATED HEMOGLOBIN (HGB A1C): Hemoglobin A1C: 7 % — AB (ref 4.0–5.6)

## 2022-05-13 LAB — POCT URINALYSIS DIPSTICK
Bilirubin, UA: NEGATIVE
Blood, UA: NEGATIVE
Glucose, UA: POSITIVE — AB
Ketones, UA: NEGATIVE
Leukocytes, UA: NEGATIVE
Nitrite, UA: NEGATIVE
Odor: NORMAL
Protein, UA: NEGATIVE
Spec Grav, UA: 1.015 (ref 1.010–1.025)
Urobilinogen, UA: 0.2 E.U./dL
pH, UA: 5.5 (ref 5.0–8.0)

## 2022-05-20 ENCOUNTER — Telehealth: Payer: Self-pay | Admitting: Cardiovascular Disease

## 2022-05-20 NOTE — Telephone Encounter (Signed)
Pt called stating he is requesting a lower venous doppler to rule out DVT. Pt stated he has seen several new articles reporting people are at risk for blood clots due to COVID vaccine. Nurse began asking pt if is currently experiencing symptoms, however, pt interrupted nurse and stated, "it doesn't matter if Im having symptoms, I pay insurance and should be able to have any test that I want." Nurse explained to pt the importance of gathering information to present to MD for review. Pt stated he is currently not having any symptoms and would like to have test done now instead waiting for symptoms to occur.  Will forward to MD for review.

## 2022-05-20 NOTE — Telephone Encounter (Signed)
Pt states he has read about people dying from covid vaccines and blood clots. He would like to have a stress test ordered to detect if he has a blood clot. Please advise.

## 2022-05-21 NOTE — Telephone Encounter (Signed)
Pt made aware of MD's recommendations and stated he has since changed his mind and does not want to proceed with ordering test.   Mariah Milling, Tollie Pizza, MD  P Cv Div [facility] Triage Caller: Unspecified (Yesterday,  9:51 AM)  unfortunately insurance will not cover a screening test We have to have diagnosis codes, symptoms put down for insurance to cover If he is having symptoms, will be best evaluated by a provider so we can document If he would like to have a venous ultrasound done, it likely can be done out-of-pocket Typically does not cost very much, can we find out what the cost might be for him As a general note I have not been having many blood clots in the legs/DVTs or arterial blockages from COVID-vaccine If we do see a clot in the leg it would be because people have been sick with COVID, laying in bed for prolonged period of time leading to the blood clot not from the vaccine itself Thx TGollan

## 2022-06-02 ENCOUNTER — Other Ambulatory Visit: Payer: Self-pay | Admitting: Internal Medicine

## 2022-06-02 DIAGNOSIS — E1165 Type 2 diabetes mellitus with hyperglycemia: Secondary | ICD-10-CM

## 2022-06-02 DIAGNOSIS — I2510 Atherosclerotic heart disease of native coronary artery without angina pectoris: Secondary | ICD-10-CM

## 2022-06-02 DIAGNOSIS — F4310 Post-traumatic stress disorder, unspecified: Secondary | ICD-10-CM

## 2022-06-02 DIAGNOSIS — K219 Gastro-esophageal reflux disease without esophagitis: Secondary | ICD-10-CM

## 2022-06-02 NOTE — Telephone Encounter (Signed)
Medication Refill - Medication: dapagliflozin propanediol (FARXIGA) 5 MG TABS tablet [[id number]]   sertraline (ZOLOFT) 50 MG tablet [[id number]]   pantoprazole (PROTONIX) 40 MG tablet [[id number]]   rosuvastatin (CRESTOR) 40 MG tablet [[id number]]   ezetimibe (ZETIA) 10 MG tablet [[id number]]   Has the patient contacted their pharmacy? Yes.   (Agent: If no, request that the patient contact the pharmacy for the refill. If patient does not wish to contact the pharmacy document the reason why and proceed with request.) (Agent: If yes, when and what did the pharmacy advise?)  Preferred Pharmacy (with phone number or street name):  [facility] - [place], [place] - [address] AT Portal to [facility] Phone: [phone]  Fax: [phone]     Has the patient been seen for an appointment in the last year OR does the patient have an upcoming appointment? Yes.    Agent: Please be advised that RX refills may take up to 3 business days. We ask that you follow-up with your pharmacy.

## 2022-06-03 ENCOUNTER — Other Ambulatory Visit: Payer: Self-pay | Admitting: Internal Medicine

## 2022-06-03 ENCOUNTER — Ambulatory Visit: Payer: No Typology Code available for payment source | Admitting: Gastroenterology

## 2022-06-03 DIAGNOSIS — E1165 Type 2 diabetes mellitus with hyperglycemia: Secondary | ICD-10-CM

## 2022-06-03 MED ORDER — DAPAGLIFLOZIN PROPANEDIOL 5 MG PO TABS: 5.0000 mg | ORAL_TABLET | Freq: Every day | ORAL | 1 refills | Status: DC

## 2022-06-03 MED ORDER — EZETIMIBE 10 MG PO TABS: 10.0000 mg | ORAL_TABLET | Freq: Every day | ORAL | 3 refills | Status: AC

## 2022-06-03 MED ORDER — SERTRALINE HCL 50 MG PO TABS: 50.0000 mg | ORAL_TABLET | Freq: Every day | ORAL | 1 refills | Status: DC

## 2022-06-03 MED ORDER — PANTOPRAZOLE SODIUM 40 MG PO TBEC: 40.0000 mg | DELAYED_RELEASE_TABLET | Freq: Every day | ORAL | 3 refills | Status: AC

## 2022-06-03 MED ORDER — ROSUVASTATIN CALCIUM 40 MG PO TABS: 40.0000 mg | ORAL_TABLET | Freq: Every day | ORAL | 3 refills | Status: AC

## 2022-06-03 NOTE — Telephone Encounter (Signed)
Medication Refill - Medication: dapagliflozin propanediol (FARXIGA) 5 MG TABS tablet  Has the patient contacted their pharmacy? No.  Ann with [facility] is calling requesting the refill for pt.   Preferred Pharmacy (with phone number or street name): [facility] - [place], [place] - [address] [facility] AT Portal to [facility]istered [facility] Sites  Phone: [phone] Fax: [phone]  Has the patient been seen for an appointment in the last year OR does the patient have an upcoming appointment? Yes.    Agent: Please be advised that RX refills may take up to 3 business days. We ask that you follow-up with your pharmacy.

## 2022-06-03 NOTE — Telephone Encounter (Signed)
Rx 06/03/22 #90 1RF- duplicate request Requested Prescriptions  Pending Prescriptions Disp Refills   dapagliflozin propanediol (FARXIGA) 5 MG TABS tablet 90 tablet 1    Sig: Take 1 tablet (5 mg total) by mouth daily before breakfast.     Endocrinology:  Diabetes - SGLT2 Inhibitors Passed - 06/03/2022  1:44 PM      Passed - Cr in normal range and within 360 days    Creat  Date Value Ref Range Status  03/20/2022 1.01 0.70 - 1.28 mg/dL Final   Creatinine,U  Date Value Ref Range Status  04/07/2016 31.6 mg/dL Final   Creatinine, Urine  Date Value Ref Range Status  02/11/2022 244 20 - 320 mg/dL Final         Passed - HBA1C is between 0 and 7.9 and within 180 days    Hemoglobin A1C  Date Value Ref Range Status  05/13/2022 7.0 (A) 4.0 - 5.6 % Final   Hgb A1c MFr Bld  Date Value Ref Range Status  02/11/2022 6.2 (H) <5.7 % of total Hgb Final    Comment:    For someone without known diabetes, a hemoglobin  A1c value between 5.7% and 6.4% is consistent with prediabetes and should be confirmed with a  follow-up test. . For someone with known diabetes, a value <7% indicates that their diabetes is well controlled. A1c targets should be individualized based on duration of diabetes, age, comorbid conditions, and other considerations. . This assay result is consistent with an increased risk of diabetes. . Currently, no consensus exists regarding use of hemoglobin A1c for diagnosis of diabetes for children. .          Passed - eGFR in normal range and within 360 days    GFR calc Af Amer  Date Value Ref Range Status  09/09/2019 93 >59 mL/min/1.73 Final    Comment:    **[facility] currently reports eGFR in compliance with the current**   recommendations of the [employer]. [facility] will   update reporting as new guidelines are published from the NKF-ASN   Task force.    GFR, Estimated  Date Value Ref Range Status  12/07/2020 >60 >60 mL/min Final    Comment:     (NOTE) Calculated using the CKD-EPI Creatinine Equation (2021)    GFR  Date Value Ref Range Status  10/16/2016 90.68 >60.00 mL/min Final   eGFR  Date Value Ref Range Status  02/11/2022 79 > OR = 60 mL/min/1.37m2 Final         Passed - Valid encounter within last 6 months    Recent Outpatient Visits           3 weeks ago Type 2 diabetes mellitus with hyperglycemia, without long-term current use of insulin   [facility] Margarita Mail, DO   2 months ago Pre-op evaluation   [facility] Margarita Mail, DO   3 months ago Incisional hernia, without obstruction or gangrene   [facility] Margarita Mail, DO   3 months ago Type 2 diabetes mellitus with hyperglycemia, without long-term current use of insulin [facility])   [facility] Margarita Mail, DO       Future Appointments             In 1 month Gollan, Tollie Pizza, MD [facility] [place]   In 2 months Margarita Mail, DO [facility], [facility]

## 2022-06-03 NOTE — Telephone Encounter (Signed)
Requested Prescriptions  Pending Prescriptions Disp Refills   dapagliflozin propanediol (FARXIGA) 5 MG TABS tablet 90 tablet 1    Sig: Take 1 tablet (5 mg total) by mouth daily before breakfast.     Endocrinology:  Diabetes - SGLT2 Inhibitors Passed - 06/02/2022  1:09 PM      Passed - Cr in normal range and within 360 days    Creat  Date Value Ref Range Status  03/20/2022 1.01 0.70 - 1.28 mg/dL Final   Creatinine,U  Date Value Ref Range Status  04/07/2016 31.6 mg/dL Final   Creatinine, Urine  Date Value Ref Range Status  02/11/2022 244 20 - 320 mg/dL Final         Passed - HBA1C is between 0 and 7.9 and within 180 days    Hemoglobin A1C  Date Value Ref Range Status  05/13/2022 7.0 (A) 4.0 - 5.6 % Final   Hgb A1c MFr Bld  Date Value Ref Range Status  02/11/2022 6.2 (H) <5.7 % of total Hgb Final    Comment:    For someone without known diabetes, a hemoglobin  A1c value between 5.7% and 6.4% is consistent with prediabetes and should be confirmed with a  follow-up test. . For someone with known diabetes, a value <7% indicates that their diabetes is well controlled. A1c targets should be individualized based on duration of diabetes, age, comorbid conditions, and other considerations. . This assay result is consistent with an increased risk of diabetes. . Currently, no consensus exists regarding use of hemoglobin A1c for diagnosis of diabetes for children. .          Passed - eGFR in normal range and within 360 days    GFR calc Af Amer  Date Value Ref Range Status  09/09/2019 93 >59 mL/min/1.73 Final    Comment:    **[facility] currently reports eGFR in compliance with the current**   recommendations of the [employer]. [facility] will   update reporting as new guidelines are published from the NKF-ASN   Task force.    GFR, Estimated  Date Value Ref Range Status  12/07/2020 >60 >60 mL/min Final    Comment:    (NOTE) Calculated using the CKD-EPI  Creatinine Equation (2021)    GFR  Date Value Ref Range Status  10/16/2016 90.68 >60.00 mL/min Final   eGFR  Date Value Ref Range Status  02/11/2022 79 > OR = 60 mL/min/1.59m2 Final         Passed - Valid encounter within last 6 months    Recent Outpatient Visits           3 weeks ago Type 2 diabetes mellitus with hyperglycemia, without long-term current use of insulin   [facility] Margarita Mail, DO   2 months ago Pre-op evaluation   [facility] Margarita Mail, DO   3 months ago Incisional hernia, without obstruction or gangrene   [facility] Margarita Mail, DO   3 months ago Type 2 diabetes mellitus with hyperglycemia, without long-term current use of insulin [facility])   [facility] Margarita Mail, DO       Future Appointments             In 1 month Gollan, Tollie Pizza, MD [facility] [place]   In 2 months Margarita Mail, DO [facility], [facility]  pantoprazole (PROTONIX) 40 MG tablet 90 tablet 3    Sig: Take 1 tablet (40 mg total) by mouth daily.     Gastroenterology: Proton Pump Inhibitors Passed - 06/02/2022  1:09 PM      Passed - Valid encounter within last 12 months    Recent Outpatient Visits           3 weeks ago Type 2 diabetes mellitus with hyperglycemia, without long-term current use of insulin   [facility] Marg[facility]ita Mail, DO   2 months ago Pre-op evaluation   [facility] Margarita Mail, DO   3 months ago Incisional hernia, without obstruction or gangrene   [facility] Margarita Mail, DO   3 months ago Type 2 diabetes mellitus with hyperglycemia, without long-term current use of insulin [facility])   [facility] Margarita Mail, DO       Future Appointments              In 1 month Gollan, Tollie Pizza, MD [place] [facility] [place]   In 2 months Margarita Mail, DO [place] [facility], [facility]             rosuvastatin (CRESTOR) 40 MG tablet 90 tablet 3    Sig: Take 1 tablet (40 mg total) by mouth daily.     Cardiovascular:  Antilipid - Statins 2 Failed - 06/02/2022  1:09 PM      Failed - Lipid Panel in normal range within the last 12 months    Cholesterol, Total  Date Value Ref Range Status  09/09/2019 169 100 - 199 mg/dL Final   Cholesterol  Date Value Ref Range Status  02/11/2022 129 <200 mg/dL Final   LDL Cholesterol (Calc)  Date Value Ref Range Status  02/11/2022 74 mg/dL (calc) Final    Comment:    Reference range: <100 . Desirable range <100 mg/dL for primary prevention;   <70 mg/dL for patients with CHD or diabetic patients  with > or = 2 CHD risk factors. Marland Kitchen LDL-C is now calculated using the Martin-Hopkins  calculation, which is a validated novel method providing  better accuracy than the Friedewald equation in the  estimation of LDL-C.  Horald Pollen et al. Lenox Ahr. 1610;960(45): 2061-2068  (http://education.[employer].com/faq/FAQ164)    Direct LDL  Date Value Ref Range Status  04/18/2016 117.0 mg/dL Final    Comment:    Optimal:  <100 mg/dLNear or Above Optimal:  100-129 mg/dLBorderline High:  130-159 mg/dLHigh:  160-189 mg/dLVery High:  >190 mg/dL   LDL Direct  Date Value Ref Range Status  09/09/2019 101 (H) 0 - 99 mg/dL Final   HDL  Date Value Ref Range Status  02/11/2022 36 (L) > OR = 40 mg/dL Final  40/98/1191 41 >47 mg/dL Final   Triglycerides  Date Value Ref Range Status  02/11/2022 104 <150 mg/dL Final         Passed - Cr in normal range and within 360 days    Creat  Date Value Ref Range Status  03/20/2022 1.01 0.70 - 1.28 mg/dL Final   Creatinine,U  Date Value Ref Range Status  04/07/2016 31.6 mg/dL Final   Creatinine, Urine  Date Value Ref Range Status  02/11/2022  244 20 - 320 mg/dL Final         Passed - Patient is not pregnant      Passed - Valid encounter within last 12 months    Recent  Outpatient Visits           3 weeks ago Type 2 diabetes mellitus with hyperglycemia, without long-term current use of insulin   [facility] Margarita Mail, DO   2 months ago Pre-op evaluation   [facility], [facility]   3 months ago Incisional hernia, without obstruction or gangrene   [facility]ita Mail, DO   3 months ago Type 2 diabetes mellitus with hyperglycemia, without long-term current use of insulin [facility])   [place] [facility] Margarita Mail, DO       Future Appointments             In 1 month Gollan, Tollie Pizza, MD [place] [facility] [place]   In 2 months Margarita Mail, DO [facility], [facility]             sertraline (ZOLOFT) 50 MG tablet 90 tablet 1    Sig: Take 1 tablet (50 mg total) by mouth daily.     Psychiatry:  Antidepressants - SSRI - sertraline Passed - 06/02/2022  1:09 PM      Passed - AST in normal range and within 360 days    AST  Date Value Ref Range Status  02/11/2022 21 10 - 35 U/L Final         Passed - ALT in normal range and within 360 days    ALT  Date Value Ref Range Status  02/11/2022 21 9 - 46 U/L Final         Passed - Completed PHQ-2 or PHQ-9 in the last 360 days      Passed - Valid encounter within last 6 months    Recent Outpatient Visits           3 weeks ago Type 2 diabetes mellitus with hyperglycemia, without long-term current use of insulin   [facility]   2 months ago Pre-op evaluation   [facility] Margarita Mail, DO   3 months ago Incisional hernia, without obstruction or gangrene   [facility] Margarita Mail, DO   3  months ago Type 2 diabetes mellitus with hyperglycemia, without long-term current use of insulin [facility])   [facility] Margarita Mail, DO       Future Appointments             In 1 month Gollan, Tollie Pizza, MD [place] [facility] [place]   In 2 months Margarita Mail, DO [place] [facility]             ezetimibe (ZETIA) 10 MG tablet 90 tablet 3    Sig: Take 1 tablet (10 mg total) by mouth daily.     Cardiovascular:  Antilipid - Sterol Transport Inhibitors Failed - 06/02/2022  1:09 PM      Failed - Lipid Panel in normal range within the last 12 months    Cholesterol, Total  Date Value Ref Range Status  09/09/2019 169 100 - 199 mg/dL Final   Cholesterol  Date Value Ref Range Status  02/11/2022 129 <200 mg/dL Final   LDL Cholesterol (Calc)  Date Value Ref Range Status  02/11/2022 74 mg/dL (calc) Final    Comment:    Reference range: <100 . Desirable range <100 mg/dL for primary prevention;   <70 mg/dL for patients with CHD or diabetic  patients  with > or = 2 CHD risk factors. Marland Kitchen LDL-C is now calculated using the Martin-Hopkins  calculation, which is a validated novel method providing  better accuracy than the Friedewald equation in the  estimation of LDL-C.  Horald Pollen et al. Lenox Ahr. 1610;960(45): 2061-2068  (http://education.[facility].com/faq/FAQ164)    Direct LDL  Date Value Ref Range Status  04/18/2016 117.0 mg/dL Final    Comment:    Optimal:  <100 mg/dLNear or Above Optimal:  100-129 mg/dLBorderline High:  130-159 mg/dLHigh:  160-189 mg/dLVery High:  >190 mg/dL   LDL Direct  Date Value Ref Range Status  09/09/2019 101 (H) 0 - 99 mg/dL Final   HDL  Date Value Ref Range Status  02/11/2022 36 (L) > OR = 40 mg/dL Final  40/98/1191 41 >47 mg/dL Final   Triglycerides  Date Value Ref Range Status  02/11/2022 104 <150 mg/dL Final         Passed - AST in normal range and within 360 days     AST  Date Value Ref Range Status  02/11/2022 21 10 - 35 U/L Final         Passed - ALT in normal range and within 360 days    ALT  Date Value Ref Range Status  02/11/2022 21 9 - 46 U/L Final         Passed - Patient is not pregnant      Passed - Valid encounter within last 12 months    Recent Outpatient Visits           3 weeks ago Type 2 diabetes mellitus with hyperglycemia, without long-term current use of insulin   [facility] Mail, DO   2 months ago Pre-op evaluation   [facility] Marg[facility] Mail, DO   3 months ago Incisional hernia, without obstruction or gangrene   [facility] Margarita Mail, DO   3 months ago Type 2 diabetes mellitus with hyperglycemia, without long-term current use of insulin [facility])   [place] [facility] Margarita Mail, DO       Future Appointments             In 1 month Gollan, Tollie Pizza, MD [facility] [place]   In 2 months Margarita Mail, DO [facility], [facility]

## 2022-07-27 NOTE — Progress Notes (Signed)
Cardiology Office Note  Alcohol abuse Date:  07/28/2022   ID:  Ricky Abbott, DOB August 13, 1945, MRN [id number]  PCP:  Margarita Mail, DO   Chief Complaint  Patient presents with   12 month follow up     "Doing well." Medications reviewed by the patient verbally.      HPI:  77 yo caucasian male with  Coronary disease, hx of CABG October 2022 diabetes type 2,  PTSD,   coronary artery disease with stent placed 20 years ago in [place],  MI in April 2014 presenting to [facility] with stent placed at that time  smoking, quit in 1984,  alcohol, depression, Long history of drinking 40 ounces beers Previous insurance and financial issues affecting his follow-up in clinic and medication compliance.   Previously in [place] Hepatitis, treated with infereron Presents for routine follow-up for chest pain, coronary artery disease  Last seen by myself in clinic June 2023 On last clinic visit with dramatic weight loss On today's visit weight up 30 pounds in the past year Was taking care of sister in [place], eating more sweets Now with air Eunice Blase, cooking [place] fries and hamburgers  Does some activity but no regular exercise program Has a lady friend, helps her  Blood pressure stable off midodrine Tolerating Crestor Zetia  Labs reviewed A1c 7.0 up from 6.1 Total cholesterol 129 LDL 74  EKG personally reviewed by myself on todays visit Normal sinus rhythm with rate  69 bpm right bundle branch block no significant ST-T wave changes, no change compared to prior EKG  s/p CABG 11/13/2020, CABG X 4.  LIMA to LAD, reverse saphenous vein graft to PDA, reverse saphenous vein graft to OM1, reverse saphenous vein graft to the first diagonal. Endoscopic greater saphenous vein harvest on the right  Hospital recovery post-CABG was complicated by hypotension, improved on midodrine, and overall physical deconditioning. He declined SNF, and was discharged home with home health PT on 11/20/2020.    Echo 10/22   2. Left ventricular ejection fraction, by estimation, is 60 to 65%. The  left ventricle has normal function. The left ventricle has no regional  wall motion abnormalities. Left ventricular diastolic parameters are  consistent with Grade I diastolic  dysfunction (impaired relaxation).   3. Right ventricular systolic function is normal. The right ventricular  size is normal. There is normal pulmonary artery systolic pressure.   4. The mitral valve is normal in structure. No evidence of mitral valve  regurgitation. No evidence of mitral stenosis.   5. The aortic valve was not well visualized. Aortic valve regurgitation  is not visualized. Mild to moderate aortic valve sclerosis/calcification  is present, without any evidence of aortic stenosis.   6. The inferior vena cava is normal in size with greater than 50%  respiratory variability, suggesting right atrial pressure of 3 mmHg.  Zio monitor reviewed Normal sinus rhythm Patient had a min HR of 60 bpm, max HR of 179 bpm, and avg HR of 87 bpm.  ---1 run of Ventricular Tachycardia occurred lasting 4 beats with a max rate of 160 bpm (avg 139 bpm).  ----8 Supraventricular Tachycardia runs occurred, the run with the fastest interval lasting 15 beats with a max rate of 179 bpm (avg 146 bpm);  the run with the fastest interval was also the longest.  ---Isolated SVEs were rare (<1.0%), SVE Couplets were rare (<1.0%), and SVE Triplets were rare (<1.0%).  ---Isolated VEs were rare (<1.0%), VE Couplets were rare (<1.0%), and no VE  Triplets were present. Ventricular Trigeminy was present.    Other past medical hx  history of  PTSD. He served in 3 tours in [place]. Has never been treated for this.     PMH:   has a past medical history of Acute ST elevation myocardial infarction (STEMI) of inferior wall (HCC) (05/19/2012), Allergy, Anxiety, Asthma, CAD, multiple vessel, Cataract, COPD (chronic obstructive pulmonary disease) (HCC),  Depression, Dyspnea, GERD (gastroesophageal reflux disease), History of bilateral cataract extraction (08/2021), History of chicken pox, Hyperlipidemia, Hypertension, Murmur, Myocardial infarction [facility]), NSVT (nonsustained ventricular tachycardia) (HCC) (12/31/2020), Prostate enlargement, PSVT (paroxysmal supraventricular tachycardia) (12/31/2020), PTSD (post-traumatic stress disorder), RBBB (right bundle branch block), 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  PSH:    Past Surgical History:  Procedure Laterality Date   CATARACT EXTRACTION W/PHACO Right 08/12/2021   Procedure: CATARACT EXTRACTION PHACO AND INTRAOCULAR LENS PLACEMENT (IOC) RIGHT DIABETIC;  Surgeon: Nevada Crane, MD;  Location: [facility];  Service: Ophthalmology;  Laterality: Right;  NEEDS ARRIVAL AFTER 7AM DUE TO TRANSPORTATION 9.57 00:54.8   CATARACT EXTRACTION W/PHACO Left 08/26/2021   Procedure: CATARACT EXTRACTION PHACO AND INTRAOCULAR LENS PLACEMENT (IOC) LEFT DIABETIC;  Surgeon: Nevada Crane, MD;  Location: [facility];  Service: Ophthalmology;  Laterality: Left;  7.59 00:50.5   CHOLECYSTECTOMY N/A 06/05/2016   Procedure: LAPAROSCOPIC CHOLECYSTECTOMY with cholangiogram;  Surgeon: Leafy Ro, MD;  Location: [facility];  Service: General;  Laterality: N/A;   CORONARY ANGIOPLASTY WITH STENT PLACEMENT Left 1999   Procedure: CORONARY ANGIOPLASTY WITH STENT PLACEMENT; Location: [place], [place]   CORONARY ARTERY BYPASS GRAFT N/A 11/13/2020   Procedure: CORONARY ARTERY BYPASS GRAFTING (CABG) x 4 ON CARDIOPULMONARY BYPASS USING LIMA AND RIGHT GSV. -LIMA to LAD -SVG to OM -SVG to PDA -SVG to DIAGONAL;  Surgeon: Corliss Skains, MD;  Location: [facility];  Service: Open Heart Surgery;  Laterality: N/A;   ENDOVEIN HARVEST OF GREATER SAPHENOUS VEIN Right 11/13/2020   Procedure: ENDOVEIN HARVEST OF GREATER SAPHENOUS VEIN;  Surgeon:  Corliss Skains, MD;  Location: [facility];  Service: Open Heart Surgery;  Laterality: Right;   EYE SURGERY     INSERTION OF MESH N/A 04/01/2022   Procedure: INSERTION OF MESH;  Surgeon: Henrene Dodge, MD;  Location: [facility];  Service: General;  Laterality: N/A;   IR GENERIC HISTORICAL  04/30/2016   IR PERC CHOLECYSTOSTOMY 04/30/2016 [facility]   LEFT HEART CATH AND CORONARY ANGIOGRAPHY Left 10/06/2019   Procedure: LEFT HEART CATH AND CORONARY ANGIOGRAPHY;  Surgeon: Antonieta Iba, MD;  Location: [facility];  Service: Cardiovascular;  Laterality: Left;   LEFT HEART CATH AND CORONARY ANGIOGRAPHY N/A 11/06/2020   Procedure: LEFT HEART CATH AND CORONARY ANGIOGRAPHY;  Surgeon: Yvonne Kendall, MD;  Location: [facility];  Service: Cardiovascular;  Laterality: N/A;   TEE WITHOUT CARDIOVERSION N/A 11/13/2020   Procedure: TRANSESOPHAGEAL ECHOCARDIOGRAM (TEE);  Surgeon: Corliss Skains, MD;  Location: [facility] OR;  Service: Open Heart Surgery;  Laterality: N/A;    Current Outpatient Medications  Medication Sig Dispense Refill   acetaminophen (TYLENOL) 500 MG tablet Take 2 tablets (1,000 mg total) by mouth every 6 (six) hours as needed for mild pain.     aspirin EC 81 MG EC tablet Take 1 tablet (81 mg total) by mouth daily. Swallow whole. 30 tablet 11   dapagliflozin propanediol (FARXIGA) 5 MG TABS tablet Take 1 tablet (5 mg  total) by mouth daily before breakfast. 90 tablet 1   ezetimibe (ZETIA) 10 MG tablet Take 1 tablet (10 mg total) by mouth daily. 90 tablet 3   nitroGLYCERIN (NITROSTAT) 0.4 MG SL tablet PLACE 1 TABLET(0.4MG  TOTAL) UNDER THE TONGUE EVERY 5 MINUTES AS NEEDED FOR CHEST PAIN 25 tablet 0   pantoprazole (PROTONIX) 40 MG tablet Take 1 tablet (40 mg total) by mouth daily. 90 tablet 3   rosuvastatin (CRESTOR) 40 MG tablet Take 1 tablet (40 mg total) by mouth daily. 90 tablet 3   sertraline (ZOLOFT) 50 MG tablet Take 1 tablet (50 mg total) by mouth daily. 90 tablet  1   No current facility-administered medications for this visit.   Facility-Administered Medications Ordered in Other Visits  Medication Dose Route Frequency Provider Last Rate Last Admin   sodium chloride flush (NS) 0.9 % injection 3 mL  3 mL Intravenous Q12H Visser, Jacquelyn D, PA-C        Allergies:   Peanut-containing drug products   Social History:  The patient  reports that he quit smoking about 39 years ago. His smoking use included cigarettes. He has a 60.00 pack-year smoking history. He has been exposed to tobacco smoke. He has never used smokeless tobacco. He reports that he does not drink alcohol and does not use drugs.   Family History:   family history includes Cancer in his brother, father, and mother; Hyperlipidemia in his father and mother; Hypertension in his father and mother.    Review of Systems: Review of Systems  Constitutional: Negative.   HENT: Negative.    Respiratory: Negative.    Cardiovascular: Negative.   Gastrointestinal: Negative.   Musculoskeletal:  Positive for joint pain.  Neurological: Negative.   Psychiatric/Behavioral: Negative.    All other systems reviewed and are negative.   PHYSICAL EXAM: VS:  BP (!) 140/62 (BP Location: Left Arm, Patient Position: Sitting, Cuff Size: Normal)   Pulse 69   Ht 5\' 11"  (1.803 m)   Wt 183 lb 8 oz (83.2 kg)   SpO2 97%   BMI 25.59 kg/m  , BMI Body mass index is 25.59 kg/m. Constitutional:  oriented to person, place, and time. No distress.  HENT:  Head: Grossly normal Eyes:  no discharge. No scleral icterus.  Neck: No JVD, no carotid bruits  Cardiovascular: Regular rate and rhythm, no murmurs appreciated Pulmonary/Chest: Clear to auscultation bilaterally, no wheezes or rails Abdominal: Soft.  no distension.  no tenderness.  Musculoskeletal: Normal range of motion Neurological:  normal muscle tone. Coordination normal. No atrophy Skin: Skin warm and dry Psychiatric: normal affect, pleasant  Recent  Labs: 02/11/2022: ALT 21 03/20/2022: BUN 22; Creat 1.01; Hemoglobin 14.6; Platelets 170; Potassium 4.4; Sodium 141   Lipid Panel Lab Results  Component Value Date   CHOL 129 02/11/2022   HDL 36 (L) 02/11/2022   LDLCALC 74 02/11/2022   TRIG 104 02/11/2022      Wt Readings from Last 3 Encounters:  07/28/22 183 lb 8 oz (83.2 kg)  05/13/22 176 lb 14.4 oz (80.2 kg)  05/08/22 177 lb (80.3 kg)     ASSESSMENT AND PLAN:  CAD, multiple vessel with stable angina Currently with no symptoms of angina. No further workup at this time. Continue current medication regimen.  Pure hypercholesterolemia -  Crestor, zetia, LDL slightly above goal after recent weight gain Low carbohydrate diet recommended, walking program  Essential hypertension -  Blood pressure is well controlled on today's visit. No changes made to the  medications.  Off midodrine which was needed when weight was low 155 pounds Now weight greater than 180s  Claudication in peripheral vascular disease (HCC) -  lower extremity Doppler with minimal disease on the right Minimal carotid disease Denies claudication symptoms  H/O burning pain in leg history of neuropathy , previous lower extremity arterial Doppler was essentially normal Denies significant burning symptoms  PTSD (post-traumatic stress disorder) stable , prior drinker after getting back from [place]  Type 2 diabetes, HbA1c goal < 7% (HCC) Managed through primary care A1c higher after recent 30 pound weight gain Recommend he moderate his carbohydrates, start walking program to achieve goal weight less than 180  Alcohol abuse Previous history of alcohol abuse.  He reports he no longer drinks   Total encounter time more than 30 minutes  Greater than 50% was spent in counseling and coordination of care with the patient   No orders of the defined types were placed in this encounter.     Signed, Dossie Arbour, M.D., Ph.D. 07/28/2022  [facility]  [place], [place] [phone]

## 2022-07-28 ENCOUNTER — Encounter: Payer: Self-pay | Admitting: Cardiovascular Disease

## 2022-07-28 ENCOUNTER — Ambulatory Visit: Payer: Medicare HMO | Attending: Cardiovascular Disease | Admitting: Cardiovascular Disease

## 2022-07-28 VITALS — BP 140/62 | HR 69 | Ht 71.0 in | Wt 183.5 lb

## 2022-07-28 DIAGNOSIS — E118 Type 2 diabetes mellitus with unspecified complications: Secondary | ICD-10-CM

## 2022-07-28 DIAGNOSIS — I951 Orthostatic hypotension: Secondary | ICD-10-CM

## 2022-07-28 DIAGNOSIS — Z7984 Long term (current) use of oral hypoglycemic drugs: Secondary | ICD-10-CM | POA: Diagnosis not present

## 2022-07-28 DIAGNOSIS — Z951 Presence of aortocoronary bypass graft: Secondary | ICD-10-CM | POA: Diagnosis not present

## 2022-07-28 DIAGNOSIS — R55 Syncope and collapse: Secondary | ICD-10-CM

## 2022-07-28 DIAGNOSIS — I2510 Atherosclerotic heart disease of native coronary artery without angina pectoris: Secondary | ICD-10-CM

## 2022-07-28 DIAGNOSIS — I10 Essential (primary) hypertension: Secondary | ICD-10-CM

## 2022-07-28 DIAGNOSIS — E785 Hyperlipidemia, unspecified: Secondary | ICD-10-CM | POA: Diagnosis not present

## 2022-07-28 DIAGNOSIS — E1142 Type 2 diabetes mellitus with diabetic polyneuropathy: Secondary | ICD-10-CM | POA: Diagnosis not present

## 2022-07-28 DIAGNOSIS — I25118 Atherosclerotic heart disease of native coronary artery with other forms of angina pectoris: Secondary | ICD-10-CM | POA: Diagnosis not present

## 2022-07-28 NOTE — Patient Instructions (Signed)
Medication Instructions:  No changes  If you need a refill on your cardiac medications before your next appointment, please call your pharmacy.   Lab work: No new labs needed  Testing/Procedures: No new testing needed  Follow-Up: At [facility], you and your health needs are our priority.  As part of our continuing mission to provide you with exceptional heart care, we have created designated Provider Care Teams.  These Care Teams include your primary Cardiologist (physician) and Advanced Practice Providers (APPs -  Physician Assistants and Nurse Practitioners) who all work together to provide you with the care you need, when you need it.  You will need a follow up appointment in 12 months  Providers on your designated Care Team:   Christopher Berge, NP Ryan Dunn, PA-C Cadence Furth, PA-C  COVID-19 Vaccine Information can be found at: [email or web address] For questions related to vaccine distribution or appointments, please email [email or web address] or call [phone].   

## 2022-08-12 NOTE — Progress Notes (Unsigned)
Established Patient Office Visit  Subjective    Patient ID: Ricky Abbott, male    DOB: 03-Oct-1945  Age: 78 y.o. MRN: [id number]  CC:  No chief complaint on file.   HPI Ricky Abbott presents to follow up on chronic medical conditions.  Diabetes, Type 2: -Last A1c 7.0 4/24 -Medications: Farxiga 5 mg -Checking BG at home: no -Diet: eats cereal for breakfast, veggies for lunch and meals on wheels for some dinners. Did recently eat a lot of easter candy.  -Eye exam: UTD 7/23 -Foot exam: UTD 4/24 -Microalbumin: UTD 1/24, elevated -Statin: yes -PNA vaccine: Due, discuss at follow up -Denies symptoms of hypoglycemia, polyuria, polydipsia, numbness extremities, foot ulcers/trauma.   HLD/CAD/Hx of MI: -Medications: Crestor 40 mg, Zetia 10 mg, aspirin 81 mg, Nitro PRN -Patient is compliant with above medications and reports no side effects.  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   PTSD: -Mood status: stable -Current treatment: Zoloft 50 mg, decreased at LOV, doing well  -Satisfied with current treatment?: yes -Duration of current treatment : chronic -Side effects: no Medication compliance: excellent compliance -PTSD from time in [place]     05/13/2022    7:54 AM 05/08/2022    8:28 AM 03/20/2022    8:03 AM 02/21/2022    7:59 AM 02/11/2022   10:02 AM  Depression screen PHQ 2/9  Decreased Interest 0 0 0 0 0  Down, Depressed, Hopeless 0 0 0  0 0  PHQ - 2 Score 0 0 0 0 0  Altered sleeping 0   0 0  Tired, decreased energy 0   0 0  Change in appetite 0   0 0  Feeling bad or failure about yourself  0   0 0  Trouble concentrating 0   0 0  Moving slowly or fidgety/restless 0   0 0  Suicidal thoughts 0   0 0  PHQ-9 Score 0   0 0  Difficult doing work/chores Not difficult at all   Not difficult at all Not difficult at all    Health Maintenance: -Blood work UTD -Colon cancer screening: due, last colonoscopy 08/2009 - had one scheduled but had to cancel due to a transportation issue  Outpatient Encounter Medications as of 08/13/2022  Medication Sig   acetaminophen (TYLENOL) 500 MG tablet Take 2 tablets (1,000 mg total) by mouth every 6 (six) hours as needed for mild pain.   aspirin EC 81 MG EC tablet Take 1 tablet (81 mg total) by mouth daily. Swallow whole.   dapagliflozin propanediol (FARXIGA) 5 MG TABS tablet Take 1 tablet (5 mg total) by mouth daily before breakfast.   ezetimibe (ZETIA) 10 MG tablet Take 1 tablet (10 mg total) by mouth daily.   nitroGLYCERIN (NITROSTAT) 0.4 MG SL tablet PLACE 1 TABLET(0.4MG  TOTAL) UNDER THE TONGUE EVERY 5 MINUTES AS NEEDED FOR CHEST PAIN   pantoprazole (  PROTONIX) 40 MG tablet Take 1 tablet (40 mg total) by mouth daily.   rosuvastatin (CRESTOR) 40 MG tablet Take 1 tablet (40 mg total) by mouth daily.   sertraline (ZOLOFT) 50 MG tablet Take 1 tablet (50 mg total) by mouth daily.   Facility-Administered Encounter Medications as of 08/13/2022  Medication   sodium chloride flush (NS) 0.9 % injection 3 mL    Past Medical History:  Diagnosis Date   Acute ST elevation myocardial infarction (STEMI) of inferior wall (HCC) 05/19/2012   a.) LHC/PCI 05/19/2012: 25% LM, severe diff prox RI disease, mild ISR pLAD stent (unknown type), 99% oRCA (Abbott Mini Vision BMS)   Allergy    Anxiety    Asthma    CAD, multiple vessel    a.) LHC ~1999 in [place] -> stent (unk type) pLAD; b.) LHC/PCI 05/19/12: 25% LM,  sev diff Dz pRI, mild ISR pLAD, 99% oRCA (Abbott MiniVision BMS); c.) LHC 10/06/19: 70% o-pLAD, 60% o-mLM, 60% p-mLAD, 70% OM1 -> Rx mgmt; d.) LHC 11/06/20: 55% m-dLM, 75% oLAD, 90% pLAD, 70% mLAD (aneurysm dist D1 takeoff), 50% RI, 55% pLCx, 90% OM1, 30% 2nd LPLB, 70% o-pRCA, 50% mRCA - CVTS consult; e.) 4v CABG 11/13/20   Cataract    COPD (chronic obstructive pulmonary disease) (HCC)    Depression    Dyspnea    GERD (gastroesophageal reflux disease)    History of bilateral cataract extraction 08/2021   History of chicken pox    Hyperlipidemia    Hypertension    Murmur    Myocardial infarction [facility])    NSVT (nonsustained ventricular tachycardia) (HCC) 12/31/2020   a.) holter 12/31/2020 --> single 4 beat run at a max rate of 160 bpm   Prostate enlargement    PSVT (paroxysmal supraventricular tachycardia) 12/31/2020   a.) holter 12/31/2020 --> 8 episodes with longest/fastest lasting 15 beats at a maximum rate of 179 bpm   PTSD (post-traumatic stress disorder)    [place] Vet   RBBB (right bundle branch block)    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   a.) LIMA-LAD, SVG-OM1, SVG-PDA, SVG-D1   Sarcoidosis of lung (HCC)    T2DM (type 2 diabetes mellitus) (HCC)    Umbilical hernia     Past Surgical History:  Procedure Laterality Date   CATARACT EXTRACTION W/PHACO Right 08/12/2021   Procedure: CATARACT EXTRACTION PHACO AND INTRAOCULAR LENS PLACEMENT (IOC) RIGHT DIABETIC;  Surgeon: Nevada Crane, MD;  Location: [facility];  Service: Ophthalmology;  Laterality: Right;  NEEDS ARRIVAL AFTER 7AM DUE TO TRANSPORTATION 9.57 00:54.8   CATARACT EXTRACTION W/PHACO Left 08/26/2021   Procedure: CATARACT EXTRACTION PHACO AND INTRAOCULAR LENS PLACEMENT (IOC) LEFT DIABETIC;  Surgeon: Nevada Crane, MD;  Location: [facility];  Service: Ophthalmology;  Laterality: Left;  7.59 00:50.5   CHOLECYSTECTOMY N/A 06/05/2016   Procedure: LAPAROSCOPIC CHOLECYSTECTOMY with cholangiogram;  Surgeon: Leafy Ro, MD;  Location: [facility] OR[facility];  Service: General;  Laterality: N/A;   CORONARY ANGIOPLASTY WITH STENT PLACEMENT Left 1999   Procedure: CORONARY ANGIOPLASTY WITH STENT PLACEMENT; Location: [place], [place]   CORONARY ARTERY BYPASS GRAFT N/A 11/13/2020   Procedure: CORONARY ARTERY BYPASS GRAFTING (CABG) x 4 ON CARDIOPULMONARY BYPASS USING LIMA AND RIGHT GSV. -LIMA to LAD -SVG to OM -SVG to PDA -SVG to DIAGONAL;  Surgeon: Corliss Skains, MD;  Location: [facility] OR;  Service: Open Heart Surgery;  Laterality: N/A;   ENDOVEIN HARVEST OF GREATER SAPHENOUS VEIN Right 11/13/2020   Procedure: ENDOVEIN HARVEST  OF GREATER SAPHENOUS VEIN;  Surgeon: Corliss Skains, MD;  Location: [facility] OR;  Service: Open Heart Surgery;  Laterality: Right;   EYE SURGERY     INSERTION OF MESH N/A 04/01/2022   Procedure: INSERTION OF MESH;  Surgeon: Henrene Dodge, MD;  Location: [facility] OR[facility];  Service: General;  Laterality: N/A;   IR GENERIC HISTORICAL  04/30/2016   IR PERC CHOLECYSTOSTOMY 04/30/2016 [facility]-INTER[facility] RAD   LEFT HEART CATH AND CORONARY ANGIOGRAPHY Left 10/06/2019   Procedure: LEFT HEART CATH AND CORONARY ANGIOGRAPHY;  Surgeon: Antonieta Iba, MD;  Location: [facility];  Service: Cardiovascular;  Laterality: Left;   LEFT HEART CATH AND CORONARY ANGIOGRAPHY N/A 11/06/2020   Procedure: LEFT HEART CATH AND CORONARY ANGIOGRAPHY;  Surgeon: Yvonne Kendall, MD;  Location: [facility];  Service: Cardiovascular;  Laterality: N/A;   TEE WITHOUT CARDIOVERSION N/A 11/13/2020   Procedure: TRANSESOPHAGEAL ECHOCARDIOGRAM (TEE);  Surgeon: Corliss Skains, MD;  Location: [facility] OR;  Service: Open Heart Surgery;  Laterality: N/A;    Family History  Problem Relation Age of Onset   Cancer Mother    Hyperlipidemia Mother    Hypertension Mother    Cancer Father        stomach   Hyperlipidemia Father    Hypertension Father    Cancer Brother        bone cancer    Social History   Socioeconomic History    Marital status: Single    Spouse name: Not on file   Number of children: Not on file   Years of education: Not on file   Highest education level: Not on file  Occupational History   Not on file  Tobacco Use   Smoking status: Former    Packs/day: 4.00    Years: 15.00    Additional pack years: 0.00    Total pack years: 60.00    Types: Cigarettes    Quit date: 08/20/1982    Years since quitting: 40.0    Passive exposure: Past   Smokeless tobacco: Never  Vaping Use   Vaping Use: Never used  Substance and Sexual Activity   Alcohol use: No   Drug use: Never   Sexual activity: Not Currently    Birth control/protection: None    Comment: NONE  Other Topics Concern   Not on file  Social History Narrative   Rocky Link grew up in [place], [place]. He moved to the [place] area in 2013. [place] Veteran. Retired Naval architect.   Social Determinants of Health   Financial Resource Strain: Patient Declined (08/10/2022)   Overall Financial Resource Strain (CARDIA)    Difficulty of Paying Living Expenses: Patient declined  Food Insecurity: No Food Insecurity (08/10/2022)   Hunger Vital Sign    Worried About Running Out of Food in the Last Year: Never true    Ran Out of Food in the Last Year: Never true  Transportation Needs: No Transportation Needs (08/10/2022)   PRAPARE - Administrator, Civil Service (Medical): No    Lack of Transportation (Non-Medical): No  Physical Activity: Inactive (08/10/2022)   Exercise Vital Sign    Days of Exercise per Week: 0 days    Minutes of Exercise per Session: 20 min  Stress: No Stress Concern Present (08/10/2022)   [employer] of Occupational Health - Occupational Stress Questionnaire    Feeling of Stress : Only a little  Social Connections: Unknown (08/10/2022)   Social Connection and Isolation Panel [NHANES]  Frequency of Communication with Friends and Family: Once a week    Frequency of Social Gatherings with Friends and Family: Never     Attends Religious Services: Never    Database administrator or Organizations: No    Attends Banker Meetings: Never    Marital Status: Patient declined  Catering manager Violence: Not At Risk (05/08/2022)   Humiliation, Afraid, Rape, and Kick questionnaire    Fear of Current or Ex-Partner: No    Emotionally Abused: No    Physically Abused: No    Sexually Abused: No    Review of Systems  Constitutional:  Negative for chills and fever.  Eyes:  Negative for blurred vision.  Respiratory:  Negative for shortness of breath.   Cardiovascular:  Negative for chest pain.  Gastrointestinal:  Negative for abdominal pain.  Musculoskeletal:  Positive for joint pain.  Neurological:  Negative for sensory change.        Objective    There were no vitals taken for this visit.  Physical Exam Constitutional:      Appearance: Normal appearance.  HENT:     Head: Normocephalic and atraumatic.  Eyes:     Conjunctiva/sclera: Conjunctivae normal.  Cardiovascular:     Rate and Rhythm: Normal rate and regular rhythm.     Pulses:          Dorsalis pedis pulses are 2+ on the right side and 2+ on the left side.  Pulmonary:     Effort: Pulmonary effort is normal.     Breath sounds: Normal breath sounds.  Musculoskeletal:     Right lower leg: No edema.     Left lower leg: No edema.     Right foot: Normal range of motion. No deformity, bunion, Charcot foot, foot drop or prominent metatarsal heads.     Left foot: Normal range of motion. No deformity, bunion, Charcot foot, foot drop or prominent metatarsal heads.  Feet:     Right foot:     Protective Sensation: 6 sites tested.  6 sites sensed.     Skin integrity: Skin integrity normal.     Toenail Condition: Right toenails are normal.     Left foot:     Protective Sensation: 6 sites tested.  6 sites sensed.     Skin integrity: Skin integrity normal.     Toenail Condition: Left toenails are normal.  Skin:    General: Skin is warm and  dry.  Neurological:     General: No focal deficit present.     Mental Status: He is alert. Mental status is at baseline.  Psychiatric:        Mood and Affect: Mood normal.        Behavior: Behavior normal.       Assessment & Plan:   1. Type 2 diabetes mellitus with hyperglycemia, without long-term current use of insulin: A1c increased to 7.0, patient states he has been eating more candy lately.  Diabetic foot exam today.  Continue Farxiga 5 mg daily, follow-up in 3 months for recheck.  - POCT HgB A1C - HM Diabetes Foot Exam  2. Dark urine: Due to dark urine since starting Farxiga, UA in the office was obtained but was negative for signs of infection.  - POCT Urinalysis Dipstick  3. CAD, multiple vessel: Reviewed most recent lipid panel with the patient from February.  LDL well-controlled.  Continue Crestor 40 mg, Zetia 10 mg.  4. Gastroesophageal reflux disease, unspecified whether esophagitis present: Stable,  continue Protonix 40 mg.   5. PTSD (post-traumatic stress disorder): Stable, continue Zoloft 50 mg.    No follow-ups on file.   Margarita Mail, DO

## 2022-08-13 ENCOUNTER — Ambulatory Visit (INDEPENDENT_AMBULATORY_CARE_PROVIDER_SITE_OTHER): Payer: Medicare HMO | Admitting: Internal Medicine

## 2022-08-13 ENCOUNTER — Encounter: Payer: Self-pay | Admitting: Internal Medicine

## 2022-08-13 VITALS — BP 118/68 | HR 73 | Temp 97.0°F | Resp 18 | Ht 71.0 in | Wt 188.2 lb

## 2022-08-13 DIAGNOSIS — I25118 Atherosclerotic heart disease of native coronary artery with other forms of angina pectoris: Secondary | ICD-10-CM | POA: Diagnosis not present

## 2022-08-13 DIAGNOSIS — E1165 Type 2 diabetes mellitus with hyperglycemia: Secondary | ICD-10-CM | POA: Diagnosis not present

## 2022-08-13 DIAGNOSIS — Z7984 Long term (current) use of oral hypoglycemic drugs: Secondary | ICD-10-CM

## 2022-08-13 DIAGNOSIS — I2510 Atherosclerotic heart disease of native coronary artery without angina pectoris: Secondary | ICD-10-CM

## 2022-08-13 LAB — POCT GLYCOSYLATED HEMOGLOBIN (HGB A1C): Hemoglobin A1C: 6.3 % — AB (ref 4.0–5.6)

## 2022-08-14 LAB — LIPID PANEL
Cholesterol: 177 mg/dL (ref <200)
HDL: 63 mg/dL (ref >=40)
LDL Cholesterol (Calc): 83 mg/dL (calc)
Non-HDL Cholesterol (Calc): 114 mg/dL (calc) (ref <130)
Total CHOL/HDL Ratio: 2.8 (calc) (ref <5.0)
Triglycerides: 216 mg/dL — ABNORMAL HIGH (ref <150)

## 2022-09-01 ENCOUNTER — Other Ambulatory Visit: Payer: Self-pay | Admitting: Cardiovascular Disease

## 2022-09-16 ENCOUNTER — Emergency Department
Admission: EM | Admit: 2022-09-16 | Discharge: 2022-09-16 | Disposition: A | Discharge status: Home / Self Care | Payer: Medicare HMO | Attending: Emergency Medicine | Admitting: Emergency Medicine

## 2022-09-16 ENCOUNTER — Emergency Department: Payer: Medicare HMO

## 2022-09-16 ENCOUNTER — Other Ambulatory Visit: Payer: Self-pay

## 2022-09-16 DIAGNOSIS — U071 COVID-19: Secondary | ICD-10-CM | POA: Insufficient documentation

## 2022-09-16 DIAGNOSIS — R059 Cough, unspecified: Secondary | ICD-10-CM | POA: Diagnosis not present

## 2022-09-16 DIAGNOSIS — E860 Dehydration: Secondary | ICD-10-CM

## 2022-09-16 DIAGNOSIS — M791 Myalgia, unspecified site: Secondary | ICD-10-CM | POA: Diagnosis not present

## 2022-09-16 DIAGNOSIS — R748 Abnormal levels of other serum enzymes: Secondary | ICD-10-CM | POA: Diagnosis not present

## 2022-09-16 DIAGNOSIS — E871 Hypo-osmolality and hyponatremia: Secondary | ICD-10-CM | POA: Diagnosis not present

## 2022-09-16 LAB — URINALYSIS, ROUTINE W REFLEX MICROSCOPIC
Bilirubin Urine: NEGATIVE
Glucose, UA: >=500 mg/dL — AB
Hgb urine dipstick: NEGATIVE
Ketones, ur: 20 mg/dL — AB
Leukocytes,Ua: NEGATIVE
Nitrite: NEGATIVE
Protein, ur: 100 mg/dL — AB
Specific Gravity, Urine: 1.029 (ref 1.005–1.030)
Squamous Epithelial / HPF: NONE SEEN /HPF (ref 0–5)
pH: 5 (ref 5.0–8.0)

## 2022-09-16 LAB — CBC
HCT: 47.3 % (ref 39.0–52.0)
Hemoglobin: 16 g/dL (ref 13.0–17.0)
MCH: 27.4 pg (ref 26.0–34.0)
MCHC: 33.8 g/dL (ref 30.0–36.0)
MCV: 81 fL (ref 80.0–100.0)
Platelets: 141 10*3/uL — ABNORMAL LOW (ref 150–400)
RBC: 5.84 MIL/uL — ABNORMAL HIGH (ref 4.22–5.81)
RDW: 15.5 % (ref 11.5–15.5)
WBC: 4.6 10*3/uL (ref 4.0–10.5)
nRBC: 0 % (ref 0.0–0.2)

## 2022-09-16 LAB — COMPREHENSIVE METABOLIC PANEL
ALT: 42 U/L (ref 0–44)
AST: 47 U/L — ABNORMAL HIGH (ref 15–41)
Albumin: 4.2 g/dL (ref 3.5–5.0)
Alkaline Phosphatase: 50 U/L (ref 38–126)
Anion gap: 11 (ref 5–15)
BUN: 26 mg/dL — ABNORMAL HIGH (ref 8–23)
CO2: 23 mmol/L (ref 22–32)
Calcium: 8.9 mg/dL (ref 8.9–10.3)
Chloride: 98 mmol/L (ref 98–111)
Creatinine, Ser: 1.14 mg/dL (ref 0.61–1.24)
GFR, Estimated: >60 mL/min (ref >60)
Glucose, Bld: 154 mg/dL — ABNORMAL HIGH (ref 70–99)
Potassium: 3.8 mmol/L (ref 3.5–5.1)
Sodium: 132 mmol/L — ABNORMAL LOW (ref 135–145)
Total Bilirubin: 1 mg/dL (ref 0.3–1.2)
Total Protein: 7.9 g/dL (ref 6.5–8.1)

## 2022-09-16 LAB — SARS CORONAVIRUS 2 BY RT PCR: SARS Coronavirus 2 by RT PCR: POSITIVE — AB

## 2022-09-16 LAB — CBG MONITORING, ED: Glucose-Capillary: 165 mg/dL — ABNORMAL HIGH (ref 70–99)

## 2022-09-16 LAB — LIPASE, BLOOD: Lipase: 54 U/L — ABNORMAL HIGH (ref 11–51)

## 2022-09-16 MED ORDER — LACTATED RINGERS IV BOLUS
1000.0000 mL | Freq: Once | INTRAVENOUS | Status: AC
  Administered 2022-09-16: 1000 mL via INTRAVENOUS

## 2022-09-16 MED ORDER — BENZONATATE 100 MG PO CAPS: 100.0000 mg | ORAL_CAPSULE | Freq: Three times a day (TID) | ORAL | 0 refills | Status: AC | PRN

## 2022-09-16 MED ORDER — ONDANSETRON 4 MG PO TBDP: 4.0000 mg | ORAL_TABLET | Freq: Three times a day (TID) | ORAL | 0 refills | Status: AC | PRN

## 2022-09-16 NOTE — ED Triage Notes (Signed)
Pt c/o body aches, weakness, and diarrhea x 5days with hx of hypertension, diabetes, and MI.

## 2022-09-16 NOTE — ED Provider Notes (Addendum)
[facility] Provider Note    Event Date/Time   First MD Initiated Contact with Patient 09/16/22 1333     (approximate)   History   Emesis and Weakness   HPI  Ricky Abbott is a 77 y.o. male presents to the emergency department for not feeling well.  Patient states that he has not been feeling well for the past 5 days.  Complaining of bodyaches, generalized weakness, diarrhea, nausea, congestion.  Complaining of a sore throat and ear pain.  Fever a couple of days ago but no fever for the past couple of days.  No recent antibiotic use.  Multiple episodes of diarrhea yesterday and states that it was ongoing, today has had 3 episodes of diarrhea that has been nonbloody.  No active abdominal pain at this time.     Physical Exam   Triage Vital Signs: ED Triage Vitals  Encounter Vitals Group     BP 09/16/22 1211 (!) 140/75     Systolic BP Percentile --      Diastolic BP Percentile --      Pulse Rate 09/16/22 1211 89     Resp 09/16/22 1211 16     Temp 09/16/22 1211 98.9 F (37.2 C)     Temp Source 09/16/22 1211 Oral     SpO2 09/16/22 1211 97 %     Weight --      Height 09/16/22 1212 5\' 11"  (1.803 m)     Head Circumference --      Peak Flow --      Pain Score 09/16/22 1211 6     Pain Loc --      Pain Education --      Exclude from Growth Chart --     Most recent vital signs: Vitals:   09/16/22 1211  BP: (!) 140/75  Pulse: 89  Resp: 16  Temp: 98.9 F (37.2 C)  SpO2: 97%    Physical Exam Constitutional:      Appearance: He is well-developed.  HENT:     Head: Atraumatic.     Right Ear: Tympanic membrane normal.     Left Ear: Tympanic membrane normal.     Mouth/Throat:     Mouth: Mucous membranes are moist.     Pharynx: Oropharynx is clear. Posterior oropharyngeal erythema present. No oropharyngeal exudate.  Eyes:     Extraocular Movements: Extraocular movements intact.     Conjunctiva/sclera: Conjunctivae normal.     Pupils: Pupils  are equal, round, and reactive to light.  Cardiovascular:     Rate and Rhythm: Regular rhythm.  Pulmonary:     Effort: No respiratory distress.     Breath sounds: No wheezing.  Abdominal:     General: Abdomen is flat. There is no distension.  Musculoskeletal:        General: Normal range of motion.     Cervical back: Normal range of motion.     Right lower leg: No edema.     Left lower leg: No edema.     Comments: No unilateral leg swelling.  +2 DP pulses  Skin:    General: Skin is warm.     Capillary Refill: Capillary refill takes less than 2 seconds.  Neurological:     Mental Status: He is alert. Mental status is at baseline.  Psychiatric:        Mood and Affect: Mood normal.     IMPRESSION / MDM / ASSESSMENT AND PLAN / ED COURSE  I  reviewed the triage vital signs and the nursing notes.  Differential diagnosis including viral illness including COVID, viral gastroenteritis, C. difficile, infectious diarrhea, pancreatitis, electrolyte abnormality.  Have a low suspicion for rhabdomyolysis, no significant severe body aches, creatinine at baseline, no myoglobin on UA.  EKG  I, Corena Herter, the attending physician, personally viewed and interpreted this ECG.   Rate: Normal  Rhythm: Normal sinus  Axis: Normal  Intervals: Right bundle branch block  ST&T Change: None  No tachycardic or bradycardic dysrhythmias while on cardiac telemetry.  RADIOLOGY  my interpretation of imaging: No focal findings of pneumonia  LABS (all labs ordered are listed, but only abnormal results are displayed) Labs interpreted as -    Labs Reviewed  SARS CORONAVIRUS 2 BY RT PCR - Abnormal; Notable for the following components:      Result Value   SARS Coronavirus 2 by RT PCR POSITIVE (*)    All other components within normal limits  LIPASE, BLOOD - Abnormal; Notable for the following components:   Lipase 54 (*)    All other components within normal limits  COMPREHENSIVE METABOLIC PANEL -  Abnormal; Notable for the following components:   Sodium 132 (*)    Glucose, Bld 154 (*)    BUN 26 (*)    AST 47 (*)    All other components within normal limits  CBC - Abnormal; Notable for the following components:   RBC 5.84 (*)    Platelets 141 (*)    All other components within normal limits  URINALYSIS, ROUTINE W REFLEX MICROSCOPIC - Abnormal; Notable for the following components:   Color, Urine YELLOW (*)    APPearance CLEAR (*)    Glucose, UA >=500 (*)    Ketones, ur 20 (*)    Protein, ur 100 (*)    Bacteria, UA RARE (*)    All other components within normal limits  CBG MONITORING, ED - Abnormal; Notable for the following components:   Glucose-Capillary 165 (*)    All other components within normal limits  GASTROINTESTINAL PANEL BY PCR, STOOL (REPLACES STOOL CULTURE)  C DIFFICILE QUICK SCREEN W PCR REFLEX       MDM    UA with no signs of urinary tract infection.  Did have ketones in the urine.  Creatinine at baseline.  Thrombocytopenia with platelets of 141.  No signs of GI bleed.  Creatinine appears to be at his baseline.  Mild hyponatremia is likely in the setting of volume depletion from his vomiting and diarrhea.  Hemoglobin is stable with no signs of a GI bleed.  Lipase mildly elevated but does not meet criteria for acute pancreatitis.  Currently has benign abdominal exam that is nontender palpation.  Have a low suspicion for acute cholecystitis or acute appendicitis.  Treated with IV fluids  Chest x-ray no signs of pneumonia.  COVID testing positive.  Clinical picture is most consistent with COVID.  Given 1 L of IV fluids.  Discussed close follow-up with primary care physician.  Given return precautions for any worsening symptoms.  Discussed symptomatic treatment with Motrin and Tylenol.  Given a prescription for antiemetics.   PROCEDURES:  Critical Care performed: No  Procedures  Patient's presentation is most consistent with acute complicated illness /  injury requiring diagnostic workup.   MEDICATIONS ORDERED IN ED: Medications  lactated ringers bolus 1,000 mL (1,000 mLs Intravenous New Bag/Given 09/16/22 1433)    FINAL CLINICAL IMPRESSION(S) / ED DIAGNOSES   Final diagnoses:  None  Rx / DC Orders   ED Discharge Orders     None        Note:  This document was prepared using Dragon voice recognition software and may include unintentional dictation errors.   Corena Herter, MD 09/16/22 1512    Corena Herter, MD 09/16/22 1513

## 2022-09-16 NOTE — Discharge Instructions (Signed)
You were seen in the emergency department for not feeling well.  You tested positive for COVID.  You had no signs of urinary tract infection.  Your chest x-ray did not show bad pneumonia.  Your lab work was otherwise normal.  You did appear mildly dehydrated and were given IV fluids.  It is important that he stay hydrated and drink plenty of fluids.  You can alternate Motrin and Tylenol for body aches and for fever/chills.  Follow-up closely with her primary care physician and return to the emergency department for any worsening symptoms.

## 2022-09-17 ENCOUNTER — Telehealth: Payer: Self-pay | Admitting: Internal Medicine

## 2022-09-17 NOTE — Telephone Encounter (Signed)
Copied from CRM [id number]. Topic: General - Other >> Sep 16, 2022  4:54 PM [name] B wrote: Reason for CRM: pt called in to inform us he was in the ER and was dx with covid, which is alrady in his chart, and he was told to stay home, so he isnt scheduling a hosp fu yet

## 2022-09-24 ENCOUNTER — Telehealth: Payer: Self-pay

## 2022-09-24 NOTE — Telephone Encounter (Signed)
Transition Care Management Unsuccessful Follow-up Telephone Call  Date of discharge and from where:  [place] 8/6  Attempts:  2nd Attempt  Reason for unsuccessful TCM follow-up call:  No answer/busy   Lenard Forth [facility] Guide, [facility] [phone] [address] [place], [place], [place] [address] Phone: [phone] Email: [email or web address]

## 2022-09-24 NOTE — Telephone Encounter (Signed)
Transition Care Management Unsuccessful Follow-up Telephone Call  Date of discharge and from where:  [place] 8/6  Attempts:  1st Attempt  Reason for unsuccessful TCM follow-up call:  No answer/busy   Lenard Forth [facility] Guide, [facility] [phone] [address] [place], [place], [place] [address] Phone: [phone] Email: [email or web address]

## 2022-11-17 ENCOUNTER — Other Ambulatory Visit: Payer: Self-pay | Admitting: Internal Medicine

## 2022-11-17 DIAGNOSIS — E1165 Type 2 diabetes mellitus with hyperglycemia: Secondary | ICD-10-CM

## 2022-11-17 DIAGNOSIS — F4310 Post-traumatic stress disorder, unspecified: Secondary | ICD-10-CM

## 2022-11-17 NOTE — Telephone Encounter (Signed)
Requested Prescriptions  Pending Prescriptions Disp Refills   sertraline (ZOLOFT) 50 MG tablet [Pharmacy Med Name: SERTRALINE TAB 50MG ] 90 tablet 1    Sig: TAKE 1 TABLET DAILY     Psychiatry:  Antidepressants - SSRI - sertraline Failed - 11/17/2022  2:18 AM      Failed - AST in normal range and within 360 days    AST  Date Value Ref Range Status  09/16/2022 47 (H) 15 - 41 U/L Final         Passed - ALT in normal range and within 360 days    ALT  Date Value Ref Range Status  09/16/2022 42 0 - 44 U/L Final         Passed - Completed PHQ-2 or PHQ-9 in the last 360 days      Passed - Valid encounter within last 6 months    Recent Outpatient Visits           3 months ago Type 2 diabetes mellitus with hyperglycemia, without long-term current use of insulin [facility])   [place] [facility] Margarita Mail, DO   6 months ago Type 2 diabetes mellitus with hyperglycemia, without long-term current use of insulin [facility])   [facility] Margarita Mail, DO   8 months ago Pre-op evaluation   [place] Margarita Mail, DO   8 months ago Incisional hernia, without obstruction or gangrene   [facility] Margarita Mail, DO   9 months ago Type 2 diabetes mellitus with hyperglycemia, without long-term current use of insulin [facility])   [place] [facility] Margarita Mail, DO       Future Appointments             In 3 months Margarita Mail, DO [place] [facility]             FARXIGA 5 MG TABS tablet [Pharmacy Med Name: FARXIGA TAB 5MG ] 90 tablet 1    Sig: TAKE 1 TABLET DAILY BEFORE BREAKFAST     Endocrinology:  Diabetes - SGLT2 Inhibitors Passed - 11/17/2022  2:18 AM      Passed - Cr in normal range and within 360 days    Creat  Date Value Ref Range Status  03/20/2022 1.01 0.70 - 1.28 mg/dL Final   Creatinine, Ser  Date Value Ref  Range Status  09/16/2022 1.14 0.61 - 1.24 mg/dL Final   Creatinine,U  Date Value Ref Range Status  04/07/2016 31.6 mg/dL Final   Creatinine, Urine  Date Value Ref Range Status  02/11/2022 244 20 - 320 mg/dL Final         Passed - HBA1C is between 0 and 7.9 and within 180 days    Hemoglobin A1C  Date Value Ref Range Status  08/13/2022 6.3 (A) 4.0 - 5.6 % Final   Hgb A1c MFr Bld  Date Value Ref Range Status  02/11/2022 6.2 (H) <5.7 % of total Hgb Final    Comment:    For someone without known diabetes, a hemoglobin  A1c value between 5.7% and 6.4% is consistent with prediabetes and should be confirmed with a  follow-up test. . For someone with known diabetes, a value <7% indicates that their diabetes is well controlled. A1c targets should be individualized based on duration of diabetes, age, comorbid conditions, and other considerations. . This assay result is consistent with an increased risk of diabetes. . Currently, no consensus exists regarding  use of hemoglobin A1c for diagnosis of diabetes for children. .          Passed - eGFR in normal range and within 360 days    GFR calc Af Amer  Date Value Ref Range Status  09/09/2019 93 >59 mL/min/1.73 Final    Comment:    **[facility] currently reports eGFR in compliance with the current**   recommendations of the [employer]. [facility] will   update reporting as new guidelines are published from the NKF-ASN   Task force.    GFR, Estimated  Date Value Ref Range Status  09/16/2022 >60 >60 mL/min Final    Comment:    (NOTE) Calculated using the CKD-EPI Creatinine Equation (2021)    GFR  Date Value Ref Range Status  10/16/2016 90.68 >60.00 mL/min Final   eGFR  Date Value Ref Range Status  02/11/2022 79 > OR = 60 mL/min/1.38m2 Final         Passed - Valid encounter within last 6 months    Recent Outpatient Visits           3 months ago Type 2 diabetes mellitus with hyperglycemia, without  long-term current use of insulin [facility])   [place] [facility] Margarita Mail, DO   6 months ago Type 2 diabetes mellitus with hyperglycemia, without long-term current use of insulin [facility])   [place] [facility] Margarita Mail, DO   8 months ago Pre-op evaluation   [place] [facility] Margarita Mail, DO   8 months ago Incisional hernia, without obstruction or gangrene   [facility] Margarita Mail, DO   9 months ago Type 2 diabetes mellitus with hyperglycemia, without long-term current use of insulin [facility])   [place] [facility] Margarita Mail, DO       Future Appointments             In 3 months Margarita Mail, DO [facility]

## 2023-01-22 DIAGNOSIS — E785 Hyperlipidemia, unspecified: Secondary | ICD-10-CM | POA: Diagnosis not present

## 2023-01-22 DIAGNOSIS — E119 Type 2 diabetes mellitus without complications: Secondary | ICD-10-CM | POA: Diagnosis not present

## 2023-01-22 DIAGNOSIS — E1165 Type 2 diabetes mellitus with hyperglycemia: Secondary | ICD-10-CM | POA: Diagnosis not present

## 2023-01-22 DIAGNOSIS — Z1159 Encounter for screening for other viral diseases: Secondary | ICD-10-CM | POA: Diagnosis not present

## 2023-01-22 DIAGNOSIS — D126 Benign neoplasm of colon, unspecified: Secondary | ICD-10-CM | POA: Diagnosis not present

## 2023-01-22 DIAGNOSIS — I2510 Atherosclerotic heart disease of native coronary artery without angina pectoris: Secondary | ICD-10-CM | POA: Diagnosis not present

## 2023-01-22 DIAGNOSIS — Z87891 Personal history of nicotine dependence: Secondary | ICD-10-CM | POA: Diagnosis not present

## 2023-01-22 DIAGNOSIS — K219 Gastro-esophageal reflux disease without esophagitis: Secondary | ICD-10-CM | POA: Diagnosis not present

## 2023-01-22 DIAGNOSIS — D869 Sarcoidosis, unspecified: Secondary | ICD-10-CM | POA: Diagnosis not present

## 2023-01-22 DIAGNOSIS — I10 Essential (primary) hypertension: Secondary | ICD-10-CM | POA: Diagnosis not present

## 2023-01-22 DIAGNOSIS — Z130 Encounter for screening for diseases of the blood and blood-forming organs and certain disorders involving the immune mechanism: Secondary | ICD-10-CM | POA: Diagnosis not present

## 2023-01-22 DIAGNOSIS — F4310 Post-traumatic stress disorder, unspecified: Secondary | ICD-10-CM | POA: Diagnosis not present

## 2023-01-22 DIAGNOSIS — Z8619 Personal history of other infectious and parasitic diseases: Secondary | ICD-10-CM | POA: Diagnosis not present

## 2023-01-29 DIAGNOSIS — E875 Hyperkalemia: Secondary | ICD-10-CM | POA: Diagnosis not present

## 2023-01-29 DIAGNOSIS — N184 Chronic kidney disease, stage 4 (severe): Secondary | ICD-10-CM | POA: Diagnosis not present

## 2023-01-29 DIAGNOSIS — E1165 Type 2 diabetes mellitus with hyperglycemia: Secondary | ICD-10-CM | POA: Diagnosis not present

## 2023-01-29 DIAGNOSIS — D751 Secondary polycythemia: Secondary | ICD-10-CM | POA: Diagnosis not present

## 2023-02-12 DIAGNOSIS — Z008 Encounter for other general examination: Secondary | ICD-10-CM | POA: Diagnosis not present

## 2023-02-16 ENCOUNTER — Ambulatory Visit: Payer: Medicare HMO | Admitting: Internal Medicine

## 2023-02-17 DIAGNOSIS — Z87891 Personal history of nicotine dependence: Secondary | ICD-10-CM | POA: Diagnosis not present

## 2023-02-17 DIAGNOSIS — R591 Generalized enlarged lymph nodes: Secondary | ICD-10-CM | POA: Diagnosis not present

## 2023-02-17 DIAGNOSIS — R918 Other nonspecific abnormal finding of lung field: Secondary | ICD-10-CM | POA: Diagnosis not present

## 2023-02-17 DIAGNOSIS — J219 Acute bronchiolitis, unspecified: Secondary | ICD-10-CM | POA: Diagnosis not present

## 2023-02-17 DIAGNOSIS — D860 Sarcoidosis of lung: Secondary | ICD-10-CM | POA: Diagnosis not present

## 2023-02-17 DIAGNOSIS — D869 Sarcoidosis, unspecified: Secondary | ICD-10-CM | POA: Diagnosis not present

## 2023-02-17 DIAGNOSIS — R911 Solitary pulmonary nodule: Secondary | ICD-10-CM | POA: Diagnosis not present

## 2023-02-18 DIAGNOSIS — I358 Other nonrheumatic aortic valve disorders: Secondary | ICD-10-CM | POA: Diagnosis not present

## 2023-02-23 DIAGNOSIS — R9431 Abnormal electrocardiogram [ECG] [EKG]: Secondary | ICD-10-CM | POA: Diagnosis not present

## 2023-02-23 DIAGNOSIS — R030 Elevated blood-pressure reading, without diagnosis of hypertension: Secondary | ICD-10-CM | POA: Diagnosis not present

## 2023-02-23 DIAGNOSIS — E1151 Type 2 diabetes mellitus with diabetic peripheral angiopathy without gangrene: Secondary | ICD-10-CM | POA: Diagnosis not present

## 2023-02-23 DIAGNOSIS — E7849 Other hyperlipidemia: Secondary | ICD-10-CM | POA: Diagnosis not present

## 2023-02-23 DIAGNOSIS — G629 Polyneuropathy, unspecified: Secondary | ICD-10-CM | POA: Diagnosis not present

## 2023-02-23 DIAGNOSIS — Z951 Presence of aortocoronary bypass graft: Secondary | ICD-10-CM | POA: Diagnosis not present

## 2023-02-23 DIAGNOSIS — Z9861 Coronary angioplasty status: Secondary | ICD-10-CM | POA: Diagnosis not present

## 2023-02-23 DIAGNOSIS — R42 Dizziness and giddiness: Secondary | ICD-10-CM | POA: Diagnosis not present

## 2023-02-23 DIAGNOSIS — I2510 Atherosclerotic heart disease of native coronary artery without angina pectoris: Secondary | ICD-10-CM | POA: Diagnosis not present

## 2023-03-06 DIAGNOSIS — Z8619 Personal history of other infectious and parasitic diseases: Secondary | ICD-10-CM | POA: Diagnosis not present

## 2023-03-06 DIAGNOSIS — D126 Benign neoplasm of colon, unspecified: Secondary | ICD-10-CM | POA: Diagnosis not present

## 2023-03-06 DIAGNOSIS — E1142 Type 2 diabetes mellitus with diabetic polyneuropathy: Secondary | ICD-10-CM | POA: Diagnosis not present

## 2023-03-06 DIAGNOSIS — D869 Sarcoidosis, unspecified: Secondary | ICD-10-CM | POA: Diagnosis not present

## 2023-03-06 DIAGNOSIS — I10 Essential (primary) hypertension: Secondary | ICD-10-CM | POA: Diagnosis not present

## 2023-03-06 DIAGNOSIS — K219 Gastro-esophageal reflux disease without esophagitis: Secondary | ICD-10-CM | POA: Diagnosis not present

## 2023-03-06 DIAGNOSIS — E785 Hyperlipidemia, unspecified: Secondary | ICD-10-CM | POA: Diagnosis not present

## 2023-03-06 DIAGNOSIS — Z800 Family history of malignant neoplasm of digestive organs: Secondary | ICD-10-CM | POA: Diagnosis not present

## 2023-03-06 DIAGNOSIS — E1151 Type 2 diabetes mellitus with diabetic peripheral angiopathy without gangrene: Secondary | ICD-10-CM | POA: Diagnosis not present

## 2023-03-06 DIAGNOSIS — F4312 Post-traumatic stress disorder, chronic: Secondary | ICD-10-CM | POA: Diagnosis not present

## 2023-03-11 NOTE — Congregational Nurse Program (Signed)
  Dept: [phone]   Congregational Nurse Program Note  Date of Encounter: 03/11/2023  Resident came to clinic to notify of moving to [place], [place] next week.  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 Past Medical History: Past Medical History:  Diagnosis Date   Acute ST elevation myocardial infarction (STEMI) of inferior wall (HCC) 05/19/2012   a.) LHC/PCI 05/19/2012: 25% LM, severe diff prox RI disease, mild ISR pLAD stent (unknown type), 99% oRCA ([employer] BMS)   Allergy    Anxiety    Asthma    CAD, multiple vessel    a.) LHC ~1999 in [place] -> stent (unk type) pLAD; b.) LHC/PCI 05/19/12: 25% LM, sev diff Dz pRI, mild ISR pLAD, 99% oRCA ([employer] BMS); c.) LHC 10/06/19: 70% o-pLAD, 60% o-mLM, 60% p-mLAD, 70% OM1 -> Rx mgmt; d.) LHC 11/06/20: 55% m-dLM, 75% oLAD, 90% pLAD, 70% mLAD (aneurysm dist D1 takeoff), 50% RI, 55% pLCx, 90% OM1, 30% 2nd LPLB, 70% o-pRCA, 50% mRCA - CVTS consult; e.) 4v CABG 11/13/20   Cataract    COPD (chronic obstructive pulmonary disease) (HCC)    Depression    Dyspnea    GERD (gastroesophageal reflux disease)    History of bilateral cataract extraction 08/2021   History of chicken pox    Hyperlipidemia    Hypertension    Murmur    Myocardial infarction [facility])    NSVT (nonsustained ventricular tachycardia) (HCC) 12/31/2020   a.) holter 12/31/2020 --> single 4 beat run at a max rate of 160 bpm   Prostate enlargement    PSVT (paroxysmal supraventricular tachycardia) 12/31/2020   a.) holter 12/31/2020 --> 8 episodes with longest/fastest lasting 15 beats at a maximum rate of 179 bpm   PTSD (post-traumatic stress disorder)    [place] Vet   RBBB (right bundle branch block)    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 Yes    Student Assistance N/A    Location Patient Served  [facility]    Encounter Setting CN site    Population Status Unknown   has own apartment at [employer]    Insurance/Financial Assistance Referral N/A    Medication N/A    Medical Provider Yes    Screening Referrals Made N/A    Medical Referrals Made N/A    Medical Appointment Completed N/A    CNP Interventions Counsel;Advocate/Support    Screenings CN Performed N/A    ED Visit Averted N/A    Life-Saving Intervention Made N/A

## 2023-03-17 ENCOUNTER — Other Ambulatory Visit: Payer: Self-pay | Admitting: Internal Medicine

## 2023-03-17 DIAGNOSIS — F4310 Post-traumatic stress disorder, unspecified: Secondary | ICD-10-CM

## 2023-03-18 NOTE — Telephone Encounter (Signed)
 Requested medication (s) are due for refill today: yes  Requested medication (s) are on the active medication list: yes  Last refill:  11/17/22  Future visit scheduled: no  Notes to clinic:  Unable to refill per protocol, courtesy refill already given, routing for provider approval.      Requested Prescriptions  Pending Prescriptions Disp Refills   sertraline  (ZOLOFT ) 50 MG tablet [Pharmacy Med Name: SERTRALINE  TAB 50MG ] 90 tablet 0    Sig: TAKE 1 TABLET DAILY     Psychiatry:  Antidepressants - SSRI - sertraline  Failed - 03/18/2023 12:01 PM      Failed - AST in normal range and within 360 days    AST  Date Value Ref Range Status  09/16/2022 47 (H) 15 - 41 U/L Final         Failed - Valid encounter within last 6 months    Recent Outpatient Visits           7 months ago Type 2 diabetes mellitus with hyperglycemia, without long-term current use of insulin  [facility])   [facility] Bernardo Fend, DO   10 months ago Type 2 diabetes mellitus with hyperglycemia, without long-term current use of insulin  [facility])   [facility] Fend, DO   12 months ago Pre-op evaluation   [facility] Bernardo Fend, DO   1 year ago Incisional hernia, without obstruction or gangrene   [facility] Fend, DO   1 year ago Type 2 diabetes mellitus with hyperglycemia, without long-term current use of insulin  [facility])   [facility] Bernardo Fend, [place]              Passed - ALT in normal range and within 360 days    ALT  Date Value Ref Range Status  09/16/2022 42 0 - 44 U/L Final         Passed - Completed PHQ-2 or PHQ-9 in the last 360 days
